# Patient Record
Sex: Female | Born: 1955
Health system: Southern US, Community
[De-identification: ages and names within clinical notes are randomized; demographics above are authoritative.]

## PROBLEM LIST (undated history)

## (undated) DIAGNOSIS — I10 Essential (primary) hypertension: Secondary | ICD-10-CM

## (undated) DIAGNOSIS — F419 Anxiety disorder, unspecified: Secondary | ICD-10-CM

## (undated) DIAGNOSIS — M1990 Unspecified osteoarthritis, unspecified site: Secondary | ICD-10-CM

## (undated) DIAGNOSIS — E119 Type 2 diabetes mellitus without complications: Secondary | ICD-10-CM

## (undated) DIAGNOSIS — D509 Iron deficiency anemia, unspecified: Secondary | ICD-10-CM

## (undated) DIAGNOSIS — M545 Low back pain, unspecified: Secondary | ICD-10-CM

## (undated) DIAGNOSIS — Z794 Long term (current) use of insulin: Secondary | ICD-10-CM

## (undated) DIAGNOSIS — N189 Chronic kidney disease, unspecified: Secondary | ICD-10-CM

## (undated) DIAGNOSIS — R200 Anesthesia of skin: Secondary | ICD-10-CM

## (undated) DIAGNOSIS — E7800 Pure hypercholesterolemia, unspecified: Secondary | ICD-10-CM

## (undated) DIAGNOSIS — E559 Vitamin D deficiency, unspecified: Secondary | ICD-10-CM

## (undated) DIAGNOSIS — K909 Intestinal malabsorption, unspecified: Secondary | ICD-10-CM

## (undated) DIAGNOSIS — R519 Headache, unspecified: Secondary | ICD-10-CM

## (undated) DIAGNOSIS — Z780 Asymptomatic menopausal state: Secondary | ICD-10-CM

## (undated) DIAGNOSIS — M170 Bilateral primary osteoarthritis of knee: Secondary | ICD-10-CM

## (undated) DIAGNOSIS — G8929 Other chronic pain: Secondary | ICD-10-CM

## (undated) DIAGNOSIS — E669 Obesity, unspecified: Secondary | ICD-10-CM

## (undated) DIAGNOSIS — F32A Depression, unspecified: Secondary | ICD-10-CM

## (undated) DIAGNOSIS — J45909 Unspecified asthma, uncomplicated: Secondary | ICD-10-CM

## (undated) DIAGNOSIS — J309 Allergic rhinitis, unspecified: Secondary | ICD-10-CM

## (undated) DIAGNOSIS — N183 Chronic kidney disease, stage 3 unspecified: Secondary | ICD-10-CM

## (undated) DIAGNOSIS — IMO0001 Reserved for inherently not codable concepts without codable children: Secondary | ICD-10-CM

## (undated) DIAGNOSIS — K219 Gastro-esophageal reflux disease without esophagitis: Secondary | ICD-10-CM

## (undated) DIAGNOSIS — D649 Anemia, unspecified: Secondary | ICD-10-CM

## (undated) DIAGNOSIS — R51 Headache: Secondary | ICD-10-CM

## (undated) HISTORY — DX: Long term (current) use of insulin: E11.9

## (undated) HISTORY — DX: Depression, unspecified: F32.A

## (undated) HISTORY — PX: COLONOSCOPY: SHX174

## (undated) HISTORY — DX: Type 2 diabetes mellitus without complications: Z79.4

## (undated) HISTORY — PX: UPPER GI ENDOSCOPY: SHX6162

## (undated) HISTORY — PX: EYE SURGERY: SHX253

## (undated) HISTORY — DX: Chronic kidney disease, stage 3 unspecified: N18.30

## (undated) HISTORY — DX: Bilateral primary osteoarthritis of knee: M17.0

## (undated) HISTORY — PX: HERNIA REPAIR: SHX51

## (undated) HISTORY — DX: Intestinal malabsorption, unspecified: K90.9

---

## 2001-12-25 ENCOUNTER — Emergency Department (HOSPITAL_COMMUNITY): Admission: EM | Admit: 2001-12-25 | Discharge: 2001-12-25 | Payer: Self-pay | Admitting: Emergency Medicine

## 2001-12-25 ENCOUNTER — Encounter: Payer: Self-pay | Admitting: Emergency Medicine

## 2002-01-24 ENCOUNTER — Encounter (HOSPITAL_COMMUNITY): Admission: RE | Admit: 2002-01-24 | Discharge: 2002-01-24 | Payer: Self-pay | Admitting: Family Medicine

## 2002-07-31 ENCOUNTER — Encounter: Payer: Self-pay | Admitting: Family Medicine

## 2002-07-31 ENCOUNTER — Encounter: Admission: RE | Admit: 2002-07-31 | Discharge: 2002-07-31 | Payer: Self-pay | Admitting: Family Medicine

## 2003-04-22 ENCOUNTER — Other Ambulatory Visit: Admission: RE | Admit: 2003-04-22 | Discharge: 2003-04-22 | Payer: Self-pay | Admitting: Family Medicine

## 2004-02-16 ENCOUNTER — Ambulatory Visit (HOSPITAL_COMMUNITY): Admission: RE | Admit: 2004-02-16 | Discharge: 2004-02-16 | Payer: Self-pay | Admitting: Family Medicine

## 2004-03-08 ENCOUNTER — Encounter: Admission: RE | Admit: 2004-03-08 | Discharge: 2004-03-08 | Payer: Self-pay | Admitting: Internal Medicine

## 2005-05-04 ENCOUNTER — Encounter: Admission: RE | Admit: 2005-05-04 | Discharge: 2005-05-04 | Payer: Self-pay | Admitting: Internal Medicine

## 2005-07-05 ENCOUNTER — Ambulatory Visit: Payer: Self-pay | Admitting: Hematology & Oncology

## 2005-07-06 ENCOUNTER — Inpatient Hospital Stay (HOSPITAL_COMMUNITY): Admission: AD | Admit: 2005-07-06 | Discharge: 2005-07-08 | Payer: Self-pay | Admitting: Internal Medicine

## 2005-07-19 LAB — CBC & DIFF AND RETIC
BASO%: 0.8 % (ref 0.0–2.0)
EOS%: 1.6 % (ref 0.0–7.0)
Eosinophils Absolute: 0.1 10*3/uL (ref 0.0–0.5)
LYMPH%: 25 % (ref 14.0–48.0)
MCH: 22.1 pg — ABNORMAL LOW (ref 26.0–34.0)
MCHC: 31.1 g/dL — ABNORMAL LOW (ref 32.0–36.0)
MCV: 70.9 fL — ABNORMAL LOW (ref 81.0–101.0)
MONO%: 10.6 % (ref 0.0–13.0)
Platelets: 224 10*3/uL (ref 145–400)
RBC: 4.75 10*6/uL (ref 3.70–5.32)
RDW: 36.2 % — ABNORMAL HIGH (ref 11.3–14.5)
RETIC #: 28.5 10*3/uL (ref 19.7–115.1)
Retic %: 0.6 % (ref 0.4–2.3)

## 2005-07-21 LAB — FERRITIN: Ferritin: 181 ng/mL (ref 10–291)

## 2005-08-01 LAB — CBC & DIFF AND RETIC
BASO%: 0.6 % (ref 0.0–2.0)
HCT: 36 % (ref 34.8–46.6)
IRF: 0.36 — ABNORMAL HIGH (ref 0.130–0.330)
MCHC: 32.3 g/dL (ref 32.0–36.0)
MONO#: 0.4 10*3/uL (ref 0.1–0.9)
NEUT%: 60.1 % (ref 39.6–76.8)
RDW: 36.7 % — ABNORMAL HIGH (ref 11.3–14.5)
RETIC #: 50.6 10*3/uL (ref 19.7–115.1)
Retic %: 1 % (ref 0.4–2.3)
WBC: 4.9 10*3/uL (ref 3.9–10.0)
lymph#: 1.5 10*3/uL (ref 0.9–3.3)

## 2005-08-01 LAB — CHCC SMEAR

## 2005-08-21 ENCOUNTER — Ambulatory Visit: Payer: Self-pay | Admitting: Hematology & Oncology

## 2005-09-06 LAB — CBC & DIFF AND RETIC
BASO%: 0.6 % (ref 0.0–2.0)
EOS%: 0.6 % (ref 0.0–7.0)
HCT: 30.1 % — ABNORMAL LOW (ref 34.8–46.6)
LYMPH%: 27.9 % (ref 14.0–48.0)
MCH: 25.9 pg — ABNORMAL LOW (ref 26.0–34.0)
MCHC: 32.6 g/dL (ref 32.0–36.0)
MCV: 79.3 fL — ABNORMAL LOW (ref 81.0–101.0)
MONO%: 10.8 % (ref 0.0–13.0)
NEUT%: 60.1 % (ref 39.6–76.8)
Platelets: 236 10*3/uL (ref 145–400)
lymph#: 1 10*3/uL (ref 0.9–3.3)

## 2005-09-08 LAB — TRANSFERRIN RECEPTOR, SOLUABLE: Transferrin Receptor, Soluble: 4.2 mg/L (ref 1.9–4.4)

## 2005-10-05 ENCOUNTER — Ambulatory Visit: Payer: Self-pay | Admitting: Hematology & Oncology

## 2005-10-05 LAB — CBC WITH DIFFERENTIAL/PLATELET
BASO%: 0.5 % (ref 0.0–2.0)
Basophils Absolute: 0 10*3/uL (ref 0.0–0.1)
HCT: 35.4 % (ref 34.8–46.6)
LYMPH%: 17.1 % (ref 14.0–48.0)
MCHC: 32.5 g/dL (ref 32.0–36.0)
MONO#: 0.5 10*3/uL (ref 0.1–0.9)
NEUT%: 70.9 % (ref 39.6–76.8)
Platelets: 235 10*3/uL (ref 145–400)
WBC: 4.4 10*3/uL (ref 3.9–10.0)

## 2005-11-22 ENCOUNTER — Encounter: Admission: RE | Admit: 2005-11-22 | Discharge: 2006-02-20 | Payer: Self-pay | Admitting: *Deleted

## 2005-11-28 ENCOUNTER — Ambulatory Visit: Payer: Self-pay | Admitting: Hematology & Oncology

## 2005-12-27 LAB — CBC WITH DIFFERENTIAL/PLATELET
Eosinophils Absolute: 0 10*3/uL (ref 0.0–0.5)
HCT: 32.5 % — ABNORMAL LOW (ref 34.8–46.6)
LYMPH%: 28.3 % (ref 14.0–48.0)
MONO#: 0.6 10*3/uL (ref 0.1–0.9)
NEUT#: 2.7 10*3/uL (ref 1.5–6.5)
NEUT%: 57.3 % (ref 39.6–76.8)
Platelets: 203 10*3/uL (ref 145–400)
RBC: 3.69 10*6/uL — ABNORMAL LOW (ref 3.70–5.32)
WBC: 4.6 10*3/uL (ref 3.9–10.0)

## 2006-01-22 ENCOUNTER — Ambulatory Visit: Payer: Self-pay | Admitting: Hematology & Oncology

## 2006-01-24 LAB — CBC WITH DIFFERENTIAL/PLATELET
BASO%: 0.5 % (ref 0.0–2.0)
LYMPH%: 35.1 % (ref 14.0–48.0)
MCHC: 32.3 g/dL (ref 32.0–36.0)
MCV: 90.6 fL (ref 81.0–101.0)
MONO%: 11.3 % (ref 0.0–13.0)
Platelets: 187 10*3/uL (ref 145–400)
RBC: 4.2 10*6/uL (ref 3.70–5.32)
RDW: 14.9 % — ABNORMAL HIGH (ref 11.3–14.5)
WBC: 3.5 10*3/uL — ABNORMAL LOW (ref 3.9–10.0)

## 2006-04-13 ENCOUNTER — Encounter: Admission: RE | Admit: 2006-04-13 | Discharge: 2006-04-13 | Payer: Self-pay | Admitting: General Practice

## 2006-10-12 ENCOUNTER — Encounter: Admission: RE | Admit: 2006-10-12 | Discharge: 2006-10-12 | Payer: Self-pay | Admitting: Internal Medicine

## 2008-01-03 ENCOUNTER — Encounter: Admission: RE | Admit: 2008-01-03 | Discharge: 2008-01-03 | Payer: Self-pay | Admitting: Internal Medicine

## 2008-01-10 ENCOUNTER — Ambulatory Visit: Payer: Self-pay | Admitting: Hematology & Oncology

## 2008-01-13 LAB — CBC WITH DIFFERENTIAL (CANCER CENTER ONLY)
BASO%: 0.4 % (ref 0.0–2.0)
Eosinophils Absolute: 0.1 10*3/uL (ref 0.0–0.5)
LYMPH#: 1.3 10*3/uL (ref 0.9–3.3)
MONO#: 0.3 10*3/uL (ref 0.1–0.9)
NEUT#: 1.8 10*3/uL (ref 1.5–6.5)
Platelets: 250 10*3/uL (ref 145–400)
RBC: 3.82 10*6/uL (ref 3.70–5.32)
WBC: 3.4 10*3/uL — ABNORMAL LOW (ref 3.9–10.0)

## 2008-01-13 LAB — CHCC SATELLITE - SMEAR

## 2008-01-15 LAB — RETICULOCYTES (CHCC)
ABS Retic: 42.9 10*3/uL (ref 19.0–186.0)
Retic Ct Pct: 1.1 % (ref 0.4–3.1)

## 2008-01-15 LAB — FERRITIN: Ferritin: 7 ng/mL — ABNORMAL LOW (ref 10–291)

## 2008-01-15 LAB — ERYTHROPOIETIN: Erythropoietin: 83.6 m[IU]/mL — ABNORMAL HIGH (ref 2.6–34.0)

## 2008-02-20 ENCOUNTER — Encounter: Admission: RE | Admit: 2008-02-20 | Discharge: 2008-02-20 | Payer: Self-pay | Admitting: Internal Medicine

## 2008-03-10 ENCOUNTER — Ambulatory Visit: Payer: Self-pay | Admitting: Hematology & Oncology

## 2008-03-11 LAB — CBC WITH DIFFERENTIAL (CANCER CENTER ONLY)
BASO%: 0.4 % (ref 0.0–2.0)
EOS%: 1.9 % (ref 0.0–7.0)
Eosinophils Absolute: 0.1 10*3/uL (ref 0.0–0.5)
MCH: 27.8 pg (ref 26.0–34.0)
MCHC: 32.9 g/dL (ref 32.0–36.0)
MONO%: 7.9 % (ref 0.0–13.0)
NEUT#: 1.7 10*3/uL (ref 1.5–6.5)
Platelets: 201 10*3/uL (ref 145–400)
RBC: 3.82 10*6/uL (ref 3.70–5.32)
RDW: 16.1 % — ABNORMAL HIGH (ref 10.5–14.6)

## 2008-03-11 LAB — RETICULOCYTES (CHCC): Retic Ct Pct: 1.5 % (ref 0.4–3.1)

## 2008-06-02 ENCOUNTER — Ambulatory Visit: Payer: Self-pay | Admitting: Hematology & Oncology

## 2008-06-25 LAB — CBC WITH DIFFERENTIAL (CANCER CENTER ONLY)
BASO#: 0 10*3/uL (ref 0.0–0.2)
Eosinophils Absolute: 0.1 10*3/uL (ref 0.0–0.5)
HCT: 33.8 % — ABNORMAL LOW (ref 34.8–46.6)
HGB: 11.1 g/dL — ABNORMAL LOW (ref 11.6–15.9)
MCH: 29.2 pg (ref 26.0–34.0)
MCHC: 32.9 g/dL (ref 32.0–36.0)
MONO%: 6.9 % (ref 0.0–13.0)
NEUT#: 2.1 10*3/uL (ref 1.5–6.5)
NEUT%: 52.4 % (ref 39.6–80.0)
RBC: 3.8 10*6/uL (ref 3.70–5.32)

## 2008-06-25 LAB — CHCC SATELLITE - SMEAR

## 2008-06-25 LAB — FERRITIN: Ferritin: 121 ng/mL (ref 10–291)

## 2008-08-27 ENCOUNTER — Emergency Department (HOSPITAL_COMMUNITY): Admission: EM | Admit: 2008-08-27 | Discharge: 2008-08-27 | Payer: Self-pay | Admitting: Emergency Medicine

## 2008-08-27 ENCOUNTER — Emergency Department (HOSPITAL_COMMUNITY): Admission: EM | Admit: 2008-08-27 | Discharge: 2008-08-27 | Payer: Self-pay | Admitting: Family Medicine

## 2008-10-14 ENCOUNTER — Ambulatory Visit: Payer: Self-pay | Admitting: Hematology & Oncology

## 2009-03-02 ENCOUNTER — Encounter: Admission: RE | Admit: 2009-03-02 | Discharge: 2009-03-02 | Payer: Self-pay | Admitting: Internal Medicine

## 2009-04-05 ENCOUNTER — Telehealth (INDEPENDENT_AMBULATORY_CARE_PROVIDER_SITE_OTHER): Payer: Self-pay | Admitting: *Deleted

## 2009-04-05 ENCOUNTER — Encounter: Payer: Self-pay | Admitting: Gastroenterology

## 2009-04-14 ENCOUNTER — Emergency Department (HOSPITAL_COMMUNITY): Admission: EM | Admit: 2009-04-14 | Discharge: 2009-04-14 | Payer: Self-pay | Admitting: Family Medicine

## 2010-03-10 ENCOUNTER — Emergency Department (HOSPITAL_COMMUNITY): Admission: EM | Admit: 2010-03-10 | Discharge: 2009-04-03 | Payer: Self-pay | Admitting: Emergency Medicine

## 2010-03-21 ENCOUNTER — Encounter
Admission: RE | Admit: 2010-03-21 | Discharge: 2010-03-21 | Payer: Self-pay | Source: Home / Self Care | Attending: Internal Medicine | Admitting: Internal Medicine

## 2010-05-03 NOTE — Progress Notes (Signed)
Summary: APPT  Phone Note Outgoing Call Call back at [facility] Phone [phone]   Call placed by: Chales Abrahams CMA Duncan Dull),  April 05, 2009 9:13 AM Summary of Call: called and gave pt the appt date and time.  The new pt packet was mailed. Initial call taken by: Chales Abrahams CMA Duncan Dull),  April 05, 2009 9:14 AM

## 2010-05-03 NOTE — Letter (Signed)
Summary: New Patient letter  [facility]  [address] [place], [place] [address]   Phone: [phone]  Fax: [phone]       04/05/2009 MRN: [id number]  [facility] [address] [place], [place]  [address]  Dear Ms. Gaunt,  Welcome to the Gastroenterology Division at [facility].    You are scheduled to see Dr.  Christella Hartigan  on 04/27/2009 at 9:00 am on the 3rd floor at [facility], [address]  We ask that you try to arrive at our  office 15 minutes prior to your appointment time to allow for check-in.  We would like you to complete the enclosed self-administered evaluation form prior to your visit and bring it with you on the day of your appointment.  We will review it with you.  Also, please bring a complete list of all your medications or, if you prefer, bring the medication bottles and we will list them.  Please bring your insurance card so that we may make a copy of it.  If your insurance requires a referral to see a specialist, please bring your referral form from your primary care physician.  Co-payments are due at the time of your visit and may be paid by cash, check or credit card.     Your office visit will consist of a consult with your physician (includes a physical exam), any laboratory testing he/she may order, scheduling of any necessary diagnostic testing (e.g. x-ray, ultrasound, CT-scan), and scheduling of a procedure (e.g. Endoscopy, Colonoscopy) if required.  Please allow enough time on your schedule to allow for any/all of these possibilities.    If you cannot keep your appointment, please call [phone] to cancel or reschedule prior to your appointment date.  This allows [place] the opportunity to schedule an appointment for another patient in need of care.  If you do not cancel or reschedule by 5 p.m. the business day prior to your appointment date, you will be charged a $50.00 late cancellation/no-show fee.    Thank you for  choosing [facility] for your medical needs.  We appreciate the opportunity to care for you.  Please visit [place] at our website  to learn more about our practice.                     Sincerely,                                                             The Gastroenterology Division

## 2010-06-19 LAB — URINALYSIS, ROUTINE W REFLEX MICROSCOPIC
Glucose, UA: NEGATIVE mg/dL
Leukocytes, UA: NEGATIVE
Nitrite: NEGATIVE
Protein, ur: 30 mg/dL — AB
Specific Gravity, Urine: 1.029 (ref 1.005–1.030)
Urobilinogen, UA: 0.2 mg/dL (ref 0.0–1.0)
pH: 5 (ref 5.0–8.0)

## 2010-06-19 LAB — COMPREHENSIVE METABOLIC PANEL
ALT: 19 U/L (ref 0–35)
AST: 24 U/L (ref 0–37)
Albumin: 3.7 g/dL (ref 3.5–5.2)
Alkaline Phosphatase: 69 U/L (ref 39–117)
BUN: 21 mg/dL (ref 6–23)
CO2: 27 mEq/L (ref 19–32)
Calcium: 10.1 mg/dL (ref 8.4–10.5)
Chloride: 103 mEq/L (ref 96–112)
Creatinine, Ser: 1.62 mg/dL — ABNORMAL HIGH (ref 0.4–1.2)
GFR calc Af Amer: 40 mL/min — ABNORMAL LOW (ref >60)
GFR calc non Af Amer: 33 mL/min — ABNORMAL LOW (ref >60)
Glucose, Bld: 232 mg/dL — ABNORMAL HIGH (ref 70–99)
Potassium: 4.2 mEq/L (ref 3.5–5.1)
Sodium: 138 mEq/L (ref 135–145)
Total Bilirubin: 0.7 mg/dL (ref 0.3–1.2)
Total Protein: 7.7 g/dL (ref 6.0–8.3)

## 2010-06-19 LAB — CBC
HCT: 34.6 % — ABNORMAL LOW (ref 36.0–46.0)
Hemoglobin: 11.3 g/dL — ABNORMAL LOW (ref 12.0–15.0)
MCHC: 32.5 g/dL (ref 30.0–36.0)
MCV: 89.8 fL (ref 78.0–100.0)
Platelets: 181 10*3/uL (ref 150–400)
RBC: 3.85 MIL/uL — ABNORMAL LOW (ref 3.87–5.11)
RDW: 13.6 % (ref 11.5–15.5)
WBC: 5.7 10*3/uL (ref 4.0–10.5)

## 2010-06-19 LAB — DIFFERENTIAL
Basophils Absolute: 0 10*3/uL (ref 0.0–0.1)
Basophils Relative: 0 % (ref 0–1)
Eosinophils Absolute: 0 10*3/uL (ref 0.0–0.7)
Eosinophils Relative: 0 % (ref 0–5)
Lymphocytes Relative: 17 % (ref 12–46)
Lymphs Abs: 1 10*3/uL (ref 0.7–4.0)
Monocytes Absolute: 0.5 10*3/uL (ref 0.1–1.0)
Monocytes Relative: 9 % (ref 3–12)
Neutro Abs: 4.3 10*3/uL (ref 1.7–7.7)
Neutrophils Relative %: 74 % (ref 43–77)

## 2010-06-19 LAB — GLUCOSE, CAPILLARY: Glucose-Capillary: 187 mg/dL — ABNORMAL HIGH (ref 70–99)

## 2010-06-19 LAB — URINE MICROSCOPIC-ADD ON

## 2010-06-19 LAB — LIPASE, BLOOD: Lipase: 40 U/L (ref 11–59)

## 2010-07-12 LAB — URINALYSIS, ROUTINE W REFLEX MICROSCOPIC
Bilirubin Urine: NEGATIVE
Glucose, UA: NEGATIVE mg/dL
Hgb urine dipstick: NEGATIVE
Ketones, ur: NEGATIVE mg/dL
Nitrite: NEGATIVE
Protein, ur: NEGATIVE mg/dL
Specific Gravity, Urine: 1.016 (ref 1.005–1.030)
Urobilinogen, UA: 0.2 mg/dL (ref 0.0–1.0)
pH: 5 (ref 5.0–8.0)

## 2010-07-12 LAB — BASIC METABOLIC PANEL
BUN: 14 mg/dL (ref 6–23)
CO2: 28 mEq/L (ref 19–32)
Calcium: 9.6 mg/dL (ref 8.4–10.5)
Chloride: 97 mEq/L (ref 96–112)
Creatinine, Ser: 1.1 mg/dL (ref 0.4–1.2)
GFR calc Af Amer: >60 mL/min (ref >60)
GFR calc non Af Amer: 52 mL/min — ABNORMAL LOW (ref >60)
Glucose, Bld: 157 mg/dL — ABNORMAL HIGH (ref 70–99)
Potassium: 3.6 mEq/L (ref 3.5–5.1)
Sodium: 131 mEq/L — ABNORMAL LOW (ref 135–145)

## 2010-07-12 LAB — URINE MICROSCOPIC-ADD ON

## 2010-07-12 LAB — DIFFERENTIAL
Basophils Absolute: 0 10*3/uL (ref 0.0–0.1)
Basophils Relative: 1 % (ref 0–1)
Eosinophils Absolute: 0.1 10*3/uL (ref 0.0–0.7)
Eosinophils Relative: 1 % (ref 0–5)
Lymphocytes Relative: 40 % (ref 12–46)
Lymphs Abs: 2.1 10*3/uL (ref 0.7–4.0)
Monocytes Absolute: 0.6 10*3/uL (ref 0.1–1.0)
Monocytes Relative: 11 % (ref 3–12)
Neutro Abs: 2.5 10*3/uL (ref 1.7–7.7)
Neutrophils Relative %: 48 % (ref 43–77)

## 2010-07-12 LAB — POCT URINALYSIS DIP (DEVICE)
Bilirubin Urine: NEGATIVE
Glucose, UA: NEGATIVE mg/dL
Ketones, ur: NEGATIVE mg/dL
Nitrite: NEGATIVE
Protein, ur: NEGATIVE mg/dL
Specific Gravity, Urine: 1.01 (ref 1.005–1.030)
Urobilinogen, UA: 0.2 mg/dL (ref 0.0–1.0)
pH: 5.5 (ref 5.0–8.0)

## 2010-07-12 LAB — CBC
HCT: 36.6 % (ref 36.0–46.0)
Hemoglobin: 12.1 g/dL (ref 12.0–15.0)
MCHC: 33.1 g/dL (ref 30.0–36.0)
MCV: 89.3 fL (ref 78.0–100.0)
Platelets: 190 10*3/uL (ref 150–400)
RBC: 4.1 MIL/uL (ref 3.87–5.11)
RDW: 12.8 % (ref 11.5–15.5)
WBC: 5.2 10*3/uL (ref 4.0–10.5)

## 2010-08-19 NOTE — Consult Note (Signed)
NAMEEMILYANNE, Gardner              ACCOUNT NO.:  [id number]   MEDICAL RECORD NO.:  [id number]          PATIENT TYPE:  INP   LOCATION:  5740                         FACILITY:  [facility]   PHYSICIAN:  Anselmo Rod, M.D.  DATE OF BIRTH:  22-May-1955   DATE OF CONSULTATION:  07/06/2005  DATE OF DISCHARGE:                                   CONSULTATION   REASON FOR CONSULTATION:  Severe iron deficiency anemia with hemoglobin of  5.9 g/dl.   ASSESSMENT:  1.  Severe iron deficiency anemia with guaiac negative stools, rule out      hematological etiology.  2.  History of peptic ulcer disease when the patient had upper      gastrointestinal bleed treated medically.  3.  History of reflux on Aciphex.  4.  History of nonsteroidal use for severe arthritis.  5.  Hypertension for the last 5 years.  6.  Adult-onset diabetes mellitus for the last 15 years, presently on oral      hypoglycemics and insulin.  7.  Allergy to codeine.   RECOMMENDATIONS:  1.  Hematology consult as soon as possible.  2.  EGD and colonoscopy once the patient has had a hematologic evaluation      and has been transfused.  3.  Avoid all nonsteroidals.  4.  Protonix 40 mg p.o. daily.  5.  EGD and colonoscopy will be planned for Monday, July 10, 2005, as      discussed with the patient.   HISTORY OF PRESENT ILLNESS:  [name] is a 55 year old, African-  American female with above-mentioned medical problems who gives a history of  generalized fatigue and some palpitations with exertion.  She says this has  been a chronic problem for her and she felt this was her baseline condition.  She had labs done about 2 weeks ago when she was found to have a hemoglobin  of around 7 g/dl, the exact numbers are not available to me, according to  the patient's verbal report.  She claims she was re-evaluated yesterday when  her hemoglobin was found to be 5.9.  For this reason, she was therefore  asked to come to the hospital  for further evaluation and blood transfusion.  She denies any major GI complaints at this time.  Her appetite is good and  weight has been stable.  She has one to two well-formed bowel movements per  day.  She has had a history of anemia in the past, but never had a  colonoscopy.  There is no known family history of colon cancer.  She has  been on Aciphex for reflux and had a bleeding ulcer 5 years ago and has been  treated for this medically.  There is no history of melena or hematochezia.  There is no history of dysphagia or odynophagia.  She has had intense desire  to chew on ice which she claims she does on a daily basis for several years  now.  She describes having heavy menstrual cycles in the past, but this is  not longer a problem.  She admits using  6-8 Advil or Aleve a day for severe  arthritis and leg pain.   PAST MEDICAL HISTORY:  See list above.   ALLERGIES:  CODEINE.   MEDICATIONS:  Aciphex, insulin, metformin, hydrochloride, Lotrisone,  Diflucan, Imdur, Benicar, Phenergan p.r.n., Niferex 150 mg daily, Actos,  Tramadol, acetaminophen.   CURRENT MEDICATIONS:  Metformin, Benicar, Aciphex, Lantus, Actos, Phenergan  and Tylenol p.r.n.   SOCIAL HISTORY:  She is single.  She has no children.  She denies use of  alcohol, tobacco or drugs.  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  She lives alone.   FAMILY HISTORY:  Her maternal grandmother had ovarian cancer.  There is no  known family of breast, endometrial or cervical cancer.  Parents had heart  disease.   REVIEW OF SYSTEMS:  CARDIOPULMONARY:  Some palpitations today.  GASTROINTESTINAL:  Appetite good.  Weight stable.  No history of abnormal  weight loss.   PHYSICAL EXAMINATION:  GENERAL:  A very pleasant, cooperative, middle-age,  African-American female in no acute distress.  VITAL SIGNS:  Stable vital signs except for slight tachycardia of 110 beats  per minute.  HEENT:  Oropharyngeal mucosa  without exudate.  NECK:  Supple with no JVD, thyromegaly or lymphadenopathy.  CHEST:  Clear to auscultation.  HEART:  S1, S2 regular.  The patient is slightly tachycardic as mentioned  above.  No rales, rhonchi or wheezing.  ABDOMEN:  Soft, nontender with normal bowel sounds.  No hepatosplenomegaly  appreciated.  RECTAL:  Digital rectal exam with stool guaiac negative, brown stools.   LABORATORY DATA AND X-RAY FINDINGS:  From Dr. Mathews Robinsons office, a white  count was 3.8 with hemoglobin 5.9, hematocrit 23.1, platelets 403,000.  Iron  was less than 10, ferritin was less than 1.  RBC folate was 918, hemoglobin  electrophoresis and hemoglobin A1c is pending.  Labs are as above.   Further recommendations will be made after I discuss her case with the  [facility] team and with Dr. Renae Gloss.      Anselmo Rod, M.D.  Electronically Signed     JNM/MEDQ  D:  07/06/2005  T:  07/07/2005  Job:  [id number]   cc:   Merlene Laughter. Renae Gloss, M.D.  Fax: [phone]

## 2010-08-19 NOTE — Discharge Summary (Signed)
NAMEMAMTA, RIMMER              ACCOUNT NO.:  [id number]   MEDICAL RECORD NO.:  [id number]          PATIENT TYPE:  INP   LOCATION:  5740                         FACILITY:  [facility]   PHYSICIAN:  Nelma Rothman, MD   DATE OF BIRTH:  1955-06-28   DATE OF ADMISSION:  07/06/2005  DATE OF DISCHARGE:  07/08/2005                                 DISCHARGE SUMMARY   PRIMARY CARE PHYSICIAN:  Cala Bradford R. Renae Gloss, M.D.   PRIMARY HEMATOLOGIST:  Rose Phi. Myna Hidalgo, M.D.   CONSULTING PHYSICIANS:  Anselmo Rod, M.D. of gastroenterology.   DISCHARGE DIAGNOSES:  1.  Severe iron deficiency anemia.  2.  Hypertension.  3.  Diabetes mellitus.   PROCEDURE:  The patient underwent transfusion of 5 units of packed red blood  cells as well as administration of 500 mg of IV InFeD.   LABORATORY DATA:  Labs drawn at her physician's office the previous day, her  hemoglobin was 5.9 with a hematocrit of 23.1 and MCV of 61.8.  Ferritin was  less than 1.  RBC folate level was 918.  Vitamin B-12 669.  Iron level was  less than 10.  TIBC and percent saturation could not be calculated secondary  to low iron levels.  Her creatinine was 1.4 on admission, and following 5  units of packed red blood cells, hemoglobin 10.5, hematocrit 32.9.   HISTORY AND PHYSICAL:  Please see dictated admission history and physical by  Dr. Renae Gloss for further details but briefly, Ms. Fraleigh is a very pleasant  55 year old female with a history of diabetes, and hypertension, and  longstanding history of severe iron deficiency anemia presenting with the  same.  She was experiencing fatigue and significant amount of pica.  She was  admitted for further evaluation and management.   HOSPITAL COURSE:  1.  Iron deficiency anemia.  The patient endorsed a longstanding history of      iron deficiency anemia.  She states that she was previously seen by Dr.      Myna Hidalgo with regard to this and had already scheduled followup with him  later this month.  She states that she last received an IV iron infusion      about two years ago but has actually never required a blood transfusion.      She denied any GI complaints including no melena, hematemesis, bright      red blood per rectum, or hematochezia.  She also denied any abdominal      pain.  Stool was negative for occult blood but nevertheless a severe      iron deficiency anemia in this 55 year old lady would warrant endoscopy      for further evaluation.  For that reason, Dr. Loreta Ave was consulted and      kindly saw Ms. Thompson on April5, 2007.  Given that she was      hemodynamically stable, hemoglobin increased with appropriately with      transfusion and stool was negative for occult blood, it was felt that      this workup could be completed as an outpatient.  I  spoke by phone with      Dr. Myna Hidalgo, as well, who agreed with the plan.  The patient received a      total of 5 units of packed red blood cells as well as one dose of 500 mg      of IV InFeD.  Her hemoglobin the morning of discharge was 10.5 and she      was feeling ready for discharge to home.  She is afebrile,      hemodynamically stable, and much improved.   DISCHARGE MEDICATIONS:  1.  Ferrous sulfate 325 mg p.o. b.i.d.  She is instructed to take this with      500 mg of vitamin C, and to take between meals to enhance absorption.  2.  She will also resume the remainder of her home medications which      include:  Actos 45 mg p.o. daily.  3.  Aciphex 30 mg p.o. daily.  4.  Metformin 500 mg p.o. b.i.d.  5.  Lantus 20 units subcu q.h.s.  6.  Benicar 40/25, one tablet p.o. daily.  7.  Allegra 180 mg p.o. daily.   DISCHARGE INSTRUCTIONS:  1.  The patient will be discharged home today for the holiday weekend.  2.  She was instructed to stay well-hydrated to prevent constipation.  3.  She will follow up with Dr. Myna Hidalgo as previously scheduled on April18,      2007.  4.  She will also follow up with Dr.  Loreta Ave for both upper endoscopy and      colonoscopy, the week of April16, 2007.  5.  She knows to return to the hospital sooner should she develop worsening      fatigue, tachycardia, or any evidence of blood loss.      Nelma Rothman, MD  Electronically Signed     RAR/MEDQ  D:  07/08/2005  T:  07/08/2005  Job:  [id number]   cc:   Merlene Laughter. Renae Gloss, M.D.  Fax: [phone]   Rose Phi. Myna Hidalgo, M.D.  Fax: [phone]   VHQION GEX BMWU, M.D.  Fax: [phone]

## 2010-12-14 ENCOUNTER — Other Ambulatory Visit: Payer: Self-pay | Admitting: Internal Medicine

## 2010-12-14 DIAGNOSIS — Z1231 Encounter for screening mammogram for malignant neoplasm of breast: Secondary | ICD-10-CM

## 2011-03-23 ENCOUNTER — Ambulatory Visit
Admission: RE | Admit: 2011-03-23 | Discharge: 2011-03-23 | Disposition: A | Discharge status: Home / Self Care | Payer: BC Managed Care – PPO | Source: Ambulatory Visit | Attending: Internal Medicine | Admitting: Internal Medicine

## 2011-03-23 DIAGNOSIS — Z1231 Encounter for screening mammogram for malignant neoplasm of breast: Secondary | ICD-10-CM

## 2012-02-02 ENCOUNTER — Other Ambulatory Visit: Payer: Self-pay | Admitting: Orthopedic Surgery

## 2012-02-08 ENCOUNTER — Encounter (HOSPITAL_BASED_OUTPATIENT_CLINIC_OR_DEPARTMENT_OTHER): Payer: Self-pay | Admitting: *Deleted

## 2012-02-08 NOTE — H&P (Signed)
  Subjective: Patient returns for followup of her right knee pain.  There is concern that patient does have a degenerated medial meniscal tear.  Patient had an injection of steroids into this knee on January 02, 2012.  Patient states that this only gave her relief for a couple days.  Since that time she has been having more pain and unfortunately was in a wheelchair over the course of the [place] at the beach.  Patient states that at that time she was having significant trouble moving the knee and was unable to bear weight.  Patient states that it slowly has gotten better but still significant amount of pain.  Patient states that the pain does wake her up at night and she is very frustrated.  He denies any new symptoms denies any fevers or chills    PMHx: Asthma, hypertension, anemia, hyperlipidemia, esophageal reflux disease and type 2 diabetes.  Medications: Metformin, Prevacid, Actos, WelChol and Diovan.  Allergies: NKDA.  Hospitalizations: None.  Surgeries: None.    FMHx: Diabetes, high blood pressure and arthritis.  SoHx: She is single.  Denies tobacco or alcohol use.  She is employed.  ROS: Patient denies dizziness, nausea, fever, chills, vomiting, shortness of breath, chest pain, loss of appetite, or rash.  Positive for glasses.  PHYSICAL EXAM: Well-developed, well-nourished.  Awake, alert, and oriented x3.  Extraocular motion is intact.  No use of accessory respiratory muscles for breathing.   Cardiovascular exam reveals a regular rhythm.  Skin is intact without cuts, scrapes, or abrasions. Patient's right knee exam shows no effusion, patient's range of motion is from 5 to approximately 80.  Patient is very tender to palpation over the medial joint line.  Neurovascularly intact distally  Assessment: Right knee pain with questionable medial meniscal tear  Plan: Patient has failed conservative therapy at this time is having what appears to be internal derangement of the right knee.  I am  concerned that she has a sizable meniscal tear and she would likely benefit from an arthroscopic procedure.  Patient will be scheduled for a right knee arthroscope for further evaluation and likely treatment. I have had a prolonged discussion with the patient regarding the risk and benefits of the surgical procedure.  The patient understands the risks include but are not limited to bleeding infection and failure of the surgery to cure the problem and need for further surgery.  The patient understands there is a slight risk of death at the time of surgery.  The patient understands these risks along with the potential benefits and wishes to proceed with surgical intervention.  The patient will discuss the full surgical procedure with Darl Pikes our surgical scheduler and the surgery will be set up at the patient's convenience.  The patient will be followed in the office in the postoperative period.

## 2012-02-08 NOTE — Progress Notes (Signed)
To come in for bmet-ekg-to bring meds list

## 2012-02-09 ENCOUNTER — Encounter (HOSPITAL_BASED_OUTPATIENT_CLINIC_OR_DEPARTMENT_OTHER)
Admission: RE | Admit: 2012-02-09 | Discharge: 2012-02-09 | Disposition: A | Discharge status: Home / Self Care | Payer: BC Managed Care – PPO | Source: Ambulatory Visit | Attending: Orthopedic Surgery | Admitting: Orthopedic Surgery

## 2012-02-09 ENCOUNTER — Other Ambulatory Visit: Payer: Self-pay

## 2012-02-09 LAB — BASIC METABOLIC PANEL
CO2: 29 mEq/L (ref 19–32)
Calcium: 9.7 mg/dL (ref 8.4–10.5)
GFR calc non Af Amer: 48 mL/min — ABNORMAL LOW (ref >90)
Glucose, Bld: 233 mg/dL — ABNORMAL HIGH (ref 70–99)
Potassium: 4.1 mEq/L (ref 3.5–5.1)
Sodium: 137 mEq/L (ref 135–145)

## 2012-02-12 ENCOUNTER — Encounter (HOSPITAL_BASED_OUTPATIENT_CLINIC_OR_DEPARTMENT_OTHER)
Admission: RE | Disposition: A | Discharge status: Home / Self Care | Payer: Self-pay | Source: Ambulatory Visit | Attending: Orthopedic Surgery

## 2012-02-12 ENCOUNTER — Encounter (HOSPITAL_BASED_OUTPATIENT_CLINIC_OR_DEPARTMENT_OTHER): Payer: Self-pay | Admitting: *Deleted

## 2012-02-12 ENCOUNTER — Encounter (HOSPITAL_BASED_OUTPATIENT_CLINIC_OR_DEPARTMENT_OTHER): Payer: Self-pay | Admitting: Anesthesiology

## 2012-02-12 ENCOUNTER — Encounter (HOSPITAL_BASED_OUTPATIENT_CLINIC_OR_DEPARTMENT_OTHER): Payer: Self-pay

## 2012-02-12 ENCOUNTER — Ambulatory Visit (HOSPITAL_BASED_OUTPATIENT_CLINIC_OR_DEPARTMENT_OTHER)
Admission: RE | Admit: 2012-02-12 | Discharge: 2012-02-12 | Disposition: A | Discharge status: Home / Self Care | Payer: BC Managed Care – PPO | Source: Ambulatory Visit | Attending: Orthopedic Surgery | Admitting: Orthopedic Surgery

## 2012-02-12 ENCOUNTER — Ambulatory Visit (HOSPITAL_BASED_OUTPATIENT_CLINIC_OR_DEPARTMENT_OTHER): Payer: BC Managed Care – PPO | Admitting: *Deleted

## 2012-02-12 DIAGNOSIS — I10 Essential (primary) hypertension: Secondary | ICD-10-CM | POA: Insufficient documentation

## 2012-02-12 DIAGNOSIS — Z79899 Other long term (current) drug therapy: Secondary | ICD-10-CM | POA: Insufficient documentation

## 2012-02-12 DIAGNOSIS — M2240 Chondromalacia patellae, unspecified knee: Secondary | ICD-10-CM | POA: Insufficient documentation

## 2012-02-12 DIAGNOSIS — E785 Hyperlipidemia, unspecified: Secondary | ICD-10-CM | POA: Insufficient documentation

## 2012-02-12 DIAGNOSIS — S83289A Other tear of lateral meniscus, current injury, unspecified knee, initial encounter: Secondary | ICD-10-CM

## 2012-02-12 DIAGNOSIS — D649 Anemia, unspecified: Secondary | ICD-10-CM | POA: Insufficient documentation

## 2012-02-12 DIAGNOSIS — E119 Type 2 diabetes mellitus without complications: Secondary | ICD-10-CM | POA: Insufficient documentation

## 2012-02-12 DIAGNOSIS — M23359 Other meniscus derangements, posterior horn of lateral meniscus, unspecified knee: Secondary | ICD-10-CM | POA: Insufficient documentation

## 2012-02-12 DIAGNOSIS — Z01812 Encounter for preprocedural laboratory examination: Secondary | ICD-10-CM | POA: Insufficient documentation

## 2012-02-12 DIAGNOSIS — Z01810 Encounter for preprocedural cardiovascular examination: Secondary | ICD-10-CM | POA: Insufficient documentation

## 2012-02-12 DIAGNOSIS — J45909 Unspecified asthma, uncomplicated: Secondary | ICD-10-CM | POA: Insufficient documentation

## 2012-02-12 DIAGNOSIS — K219 Gastro-esophageal reflux disease without esophagitis: Secondary | ICD-10-CM | POA: Insufficient documentation

## 2012-02-12 HISTORY — DX: Essential (primary) hypertension: I10

## 2012-02-12 HISTORY — DX: Anesthesia of skin: R20.0

## 2012-02-12 HISTORY — PX: KNEE ARTHROSCOPY: SHX127

## 2012-02-12 HISTORY — DX: Type 2 diabetes mellitus without complications: E11.9

## 2012-02-12 HISTORY — DX: Unspecified osteoarthritis, unspecified site: M19.90

## 2012-02-12 HISTORY — DX: Gastro-esophageal reflux disease without esophagitis: K21.9

## 2012-02-12 HISTORY — DX: Anemia, unspecified: D64.9

## 2012-02-12 LAB — POCT HEMOGLOBIN-HEMACUE: Hemoglobin: 12.2 g/dL (ref 12.0–15.0)

## 2012-02-12 SURGERY — ARTHROSCOPY, KNEE
Anesthesia: General | Site: Knee | Laterality: Right | Wound class: Clean

## 2012-02-12 MED ORDER — FENTANYL CITRATE 0.05 MG/ML IJ SOLN
INTRAMUSCULAR | Status: DC | PRN
  Administered 2012-02-12 (×2): 50 ug via INTRAVENOUS

## 2012-02-12 MED ORDER — KETOROLAC TROMETHAMINE 30 MG/ML IJ SOLN
30.0000 mg | Freq: Once | INTRAMUSCULAR | Status: AC
  Administered 2012-02-12: 30 mg via INTRAVENOUS

## 2012-02-12 MED ORDER — DEXTROSE-NACL 5-0.45 % IV SOLN: INTRAVENOUS | Status: DC

## 2012-02-12 MED ORDER — CHLORHEXIDINE GLUCONATE 4 % EX LIQD: 60.0000 mL | Freq: Once | CUTANEOUS | Status: DC

## 2012-02-12 MED ORDER — BUPIVACAINE HCL (PF) 0.5 % IJ SOLN
INTRAMUSCULAR | Status: DC | PRN
  Administered 2012-02-12: 20 mL

## 2012-02-12 MED ORDER — LACTATED RINGERS IV SOLN
INTRAVENOUS | Status: DC
  Administered 2012-02-12 (×2): via INTRAVENOUS

## 2012-02-12 MED ORDER — ONDANSETRON HCL 4 MG/2ML IJ SOLN
INTRAMUSCULAR | Status: DC | PRN
  Administered 2012-02-12: 4 mg via INTRAVENOUS

## 2012-02-12 MED ORDER — OXYCODONE-ACETAMINOPHEN 5-325 MG PO TABS: 1.0000 | ORAL_TABLET | ORAL | Status: DC | PRN

## 2012-02-12 MED ORDER — PROPOFOL 10 MG/ML IV BOLUS
INTRAVENOUS | Status: DC | PRN
  Administered 2012-02-12: 150 mg via INTRAVENOUS

## 2012-02-12 MED ORDER — HYDROMORPHONE HCL PF 1 MG/ML IJ SOLN
0.2500 mg | INTRAMUSCULAR | Status: DC | PRN
  Administered 2012-02-12 (×3): 0.5 mg via INTRAVENOUS

## 2012-02-12 MED ORDER — OXYCODONE HCL 5 MG PO TABS: 5.0000 mg | ORAL_TABLET | Freq: Once | ORAL | Status: DC | PRN

## 2012-02-12 MED ORDER — LACTATED RINGERS IV SOLN: INTRAVENOUS | Status: DC

## 2012-02-12 MED ORDER — LIDOCAINE HCL (CARDIAC) 20 MG/ML IV SOLN
INTRAVENOUS | Status: DC | PRN
  Administered 2012-02-12: 50 mg via INTRAVENOUS

## 2012-02-12 MED ORDER — MIDAZOLAM HCL 5 MG/5ML IJ SOLN
INTRAMUSCULAR | Status: DC | PRN
  Administered 2012-02-12: 2 mg via INTRAVENOUS

## 2012-02-12 MED ORDER — OXYCODONE HCL 5 MG/5ML PO SOLN: 5.0000 mg | Freq: Once | ORAL | Status: DC | PRN

## 2012-02-12 MED ORDER — SODIUM CHLORIDE 0.9 % IR SOLN
Status: DC | PRN
  Administered 2012-02-12: 14:00:00

## 2012-02-12 MED ORDER — CEFAZOLIN SODIUM-DEXTROSE 2-3 GM-% IV SOLR
2.0000 g | INTRAVENOUS | Status: AC
  Administered 2012-02-12: 2 g via INTRAVENOUS

## 2012-02-12 SURGICAL SUPPLY — 40 items
BANDAGE ELASTIC 6 VELCRO ST LF (GAUZE/BANDAGES/DRESSINGS) ×2 IMPLANT
BLADE 4.2CUDA (BLADE) IMPLANT
BLADE CUTTER GATOR 3.5 (BLADE) ×2 IMPLANT
BLADE GREAT WHITE 4.2 (BLADE) ×2 IMPLANT
CANISTER OMNI JUG 16 LITER (MISCELLANEOUS) IMPLANT
CANISTER SUCTION 2500CC (MISCELLANEOUS) IMPLANT
CHLORAPREP W/TINT 26ML (MISCELLANEOUS) ×2 IMPLANT
CLOTH BEACON ORANGE TIMEOUT ST (SAFETY) ×2 IMPLANT
DRAPE ARTHROSCOPY W/POUCH 114 (DRAPES) ×2 IMPLANT
ELECT MENISCUS 165MM 90D (ELECTRODE) IMPLANT
ELECT REM PT RETURN 9FT ADLT (ELECTROSURGICAL)
ELECTRODE REM PT RTRN 9FT ADLT (ELECTROSURGICAL) IMPLANT
GAUZE XEROFORM 1X8 LF (GAUZE/BANDAGES/DRESSINGS) ×2 IMPLANT
GLOVE BIO SURGEON STRL SZ 6.5 (GLOVE) ×4 IMPLANT
GLOVE BIO SURGEON STRL SZ7 (GLOVE) ×2 IMPLANT
GLOVE BIO SURGEON STRL SZ7.5 (GLOVE) ×2 IMPLANT
GLOVE BIOGEL PI IND STRL 7.0 (GLOVE) ×2 IMPLANT
GLOVE BIOGEL PI IND STRL 8 (GLOVE) ×1 IMPLANT
GLOVE BIOGEL PI INDICATOR 7.0 (GLOVE) ×2
GLOVE BIOGEL PI INDICATOR 8 (GLOVE) ×1
GLOVE ECLIPSE 6.5 STRL STRAW (GLOVE) ×4 IMPLANT
GOWN PREVENTION PLUS XLARGE (GOWN DISPOSABLE) ×8 IMPLANT
GOWN PREVENTION PLUS XXLARGE (GOWN DISPOSABLE) ×2 IMPLANT
KNEE WRAP E Z 3 GEL PACK (MISCELLANEOUS) ×2 IMPLANT
NDL SAFETY ECLIPSE 18X1.5 (NEEDLE) IMPLANT
NEEDLE FILTER BLUNT 18X 1/2SAF (NEEDLE)
NEEDLE FILTER BLUNT 18X1 1/2 (NEEDLE) IMPLANT
NEEDLE HYPO 18GX1.5 SHARP (NEEDLE)
PACK ARTHROSCOPY DSU (CUSTOM PROCEDURE TRAY) ×2 IMPLANT
PACK BASIN DAY SURGERY FS (CUSTOM PROCEDURE TRAY) ×2 IMPLANT
PENCIL BUTTON HOLSTER BLD 10FT (ELECTRODE) IMPLANT
SET ARTHROSCOPY TUBING (MISCELLANEOUS) ×1
SET ARTHROSCOPY TUBING LN (MISCELLANEOUS) ×1 IMPLANT
SLEEVE SCD COMPRESS KNEE MED (MISCELLANEOUS) IMPLANT
SPONGE GAUZE 4X4 12PLY (GAUZE/BANDAGES/DRESSINGS) ×2 IMPLANT
SYR 3ML 18GX1 1/2 (SYRINGE) IMPLANT
SYR 5ML LL (SYRINGE) IMPLANT
TOWEL OR 17X24 6PK STRL BLUE (TOWEL DISPOSABLE) ×2 IMPLANT
WAND STAR VAC 90 (SURGICAL WAND) IMPLANT
WATER STERILE IRR 1000ML POUR (IV SOLUTION) ×2 IMPLANT

## 2012-02-12 NOTE — Anesthesia Postprocedure Evaluation (Signed)
  Anesthesia Post-op Note  Patient: Karen Gardner Thrush  Procedure(s) Performed: Procedure(s) (LRB) with comments: ARTHROSCOPY KNEE (Right) - Partial Lateral Meniscectomy, Debridement chondromalacia  Patient Location: PACU  Anesthesia Type:General  Level of Consciousness: awake and alert   Airway and Oxygen Therapy: Patient Spontanous Breathing  Post-op Pain: mild  Post-op Assessment: Post-op Vital signs reviewed, Patient's Cardiovascular Status Stable, Respiratory Function Stable, Patent Airway and No signs of Nausea or vomiting  Post-op Vital Signs: Reviewed and stable  Complications: No apparent anesthesia complications

## 2012-02-12 NOTE — Anesthesia Preprocedure Evaluation (Addendum)
Anesthesia Evaluation  Patient identified by MRN, date of birth, ID band Patient awake    Reviewed: Allergy & Precautions, H&P , NPO status , Patient's Chart, lab work & pertinent test results  Airway Mallampati: II TM Distance: >3 FB Neck ROM: Full    Dental No notable dental hx. (+) Teeth Intact and Dental Advisory Given   Pulmonary neg pulmonary ROS,  breath sounds clear to auscultation  Pulmonary exam normal       Cardiovascular hypertension, On Medications Rhythm:Regular Rate:Normal     Neuro/Psych negative neurological ROS  negative psych ROS   GI/Hepatic Neg liver ROS, GERD-  Controlled,  Endo/Other  diabetes, Type 1, Insulin Dependent  Renal/GU negative Renal ROS  negative genitourinary   Musculoskeletal   Abdominal   Peds  Hematology negative hematology ROS (+)   Anesthesia Other Findings   Reproductive/Obstetrics negative OB ROS                           Anesthesia Physical Anesthesia Plan  ASA: III  Anesthesia Plan: General   Post-op Pain Management:    Induction: Intravenous  Airway Management Planned: LMA  Additional Equipment:   Intra-op Plan:   Post-operative Plan: Extubation in OR  Informed Consent: I have reviewed the patients History and Physical, chart, labs and discussed the procedure including the risks, benefits and alternatives for the proposed anesthesia with the patient or authorized representative who has indicated his/her understanding and acceptance.   Dental advisory given  Plan Discussed with: CRNA  Anesthesia Plan Comments:         Anesthesia Quick Evaluation

## 2012-02-12 NOTE — Interval H&P Note (Signed)
History and Physical Interval Note:  02/12/2012 12:57 PM  Karen Gardner  has presented today for surgery, with the diagnosis of Right Knee Medial Mensical Tear  The various methods of treatment have been discussed with the patient and family. After consideration of risks, benefits and other options for treatment, the patient has consented to  Procedure(s) (LRB) with comments: ARTHROSCOPY KNEE (Right) as a surgical intervention .  The patient's history has been reviewed, patient examined, no change in status, stable for surgery.  I have reviewed the patient's chart and labs.  Questions were answered to the patient's satisfaction.     Nestor Lewandowsky

## 2012-02-12 NOTE — Op Note (Signed)
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  Surgeon: Feliberto Gottron. Turner Daniels M.D.  Assist: Shirl Harris PA-C  Anes: General LMA  EBL: Minimal  Fluids: 800 cc   Indications: Patient has catching popping and pain in her right knee especially along the lateral meniscal region. McMurray's test is positive.. Pt has failed conservative treatment with anti-inflammatory medicines, physical therapy, and modified activites but did get good temporarily from an intra-articular cortisone injection. Pain has recurred and patient desires elective arthroscopic evaluation and treatment of knee. Risks and benefits of surgery have been discussed and questions answered.  Procedure: Patient identified by arm band and taken to the operating room at the day surgery Center. The appropriate anesthetic monitors were attached, and General LMA anesthesia was induced without difficulty. Lateral post was applied to the table and the lower extremity was prepped and draped in usual sterile fashion from the ankle to the midthigh. Time out procedure was performed. We began the operation by making standard inferior lateral and inferior medial peripatellar portals with a #11 blade allowing introduction of the arthroscope through the inferior lateral portal and the out flow to the inferior medial portal. Pump pressure was set at 100 mmHg and diagnostic arthroscopy  revealed grade 3 chondromalacia the trochlea debrider back to see margin of 3.5 Gator sucker shaver. The medial articular and meniscal cartilages were in excellent condition, the anterior cruciate ligament and PCL were in excellent condition. On the lateral side the patient had a large complex tearing of the entire posterior lateral horns of the lateral  meniscus. This was debrided piecemeal with a large biter a large upbiter and 42 gray-white sucker shaver and a 3.5 Gator sucker shaver there was also grade 3 chondromalacia of the lateral femoral condyle and less so the lateral tibial plateau that required debridement. The knee was irrigated out normal saline solution. A dressing of xerofoam 4 x 4 dressing sponges, web roll and an Ace wrap was applied. The patient was awakened extubated and taken to the recovery without difficulty.    Signed: Nestor Lewandowsky, MD

## 2012-02-12 NOTE — Transfer of Care (Signed)
Immediate Anesthesia Transfer of Care Note  Patient: Karen Gardner  Procedure(s) Performed: Procedure(s) (LRB) with comments: ARTHROSCOPY KNEE (Right) - Partial Lateral Meniscectomy, Debridement chondromalacia  Patient Location: PACU  Anesthesia Type:General  Level of Consciousness: sedated  Airway & Oxygen Therapy: Patient Spontanous Breathing and Patient connected to face mask oxygen  Post-op Assessment: Report given to PACU RN and Post -op Vital signs reviewed and stable  Post vital signs: Reviewed and stable  Complications: No apparent anesthesia complications

## 2012-02-12 NOTE — Anesthesia Procedure Notes (Signed)
Procedure Name: LMA Insertion Date/Time: 02/12/2012 1:26 PM Performed by: Gar Gibbon Pre-anesthesia Checklist: Patient identified, Emergency Drugs available, Suction available and Patient being monitored Patient Re-evaluated:Patient Re-evaluated prior to inductionOxygen Delivery Method: Circle System Utilized Preoxygenation: Pre-oxygenation with 100% oxygen Intubation Type: IV induction Ventilation: Mask ventilation without difficulty LMA: LMA inserted LMA Size: 4.0 Number of attempts: 1 Airway Equipment and Method: bite block Placement Confirmation: positive ETCO2 Tube secured with: Tape Dental Injury: Teeth and Oropharynx as per pre-operative assessment

## 2012-02-13 ENCOUNTER — Encounter (HOSPITAL_BASED_OUTPATIENT_CLINIC_OR_DEPARTMENT_OTHER): Payer: Self-pay | Admitting: Orthopedic Surgery

## 2012-03-12 ENCOUNTER — Other Ambulatory Visit: Payer: Self-pay | Admitting: Internal Medicine

## 2012-03-12 DIAGNOSIS — Z1231 Encounter for screening mammogram for malignant neoplasm of breast: Secondary | ICD-10-CM

## 2012-03-28 ENCOUNTER — Ambulatory Visit
Admission: RE | Admit: 2012-03-28 | Discharge: 2012-03-28 | Disposition: A | Discharge status: Home / Self Care | Payer: BC Managed Care – PPO | Source: Ambulatory Visit | Attending: Internal Medicine | Admitting: Internal Medicine

## 2012-03-28 DIAGNOSIS — Z1231 Encounter for screening mammogram for malignant neoplasm of breast: Secondary | ICD-10-CM

## 2013-02-12 ENCOUNTER — Other Ambulatory Visit: Payer: Self-pay

## 2013-02-12 DIAGNOSIS — Z1231 Encounter for screening mammogram for malignant neoplasm of breast: Secondary | ICD-10-CM

## 2013-04-01 ENCOUNTER — Ambulatory Visit: Payer: BC Managed Care – PPO

## 2013-08-19 ENCOUNTER — Ambulatory Visit
Admission: RE | Admit: 2013-08-19 | Discharge: 2013-08-19 | Disposition: A | Discharge status: Home / Self Care | Payer: 59 | Source: Ambulatory Visit

## 2013-08-19 DIAGNOSIS — Z1231 Encounter for screening mammogram for malignant neoplasm of breast: Secondary | ICD-10-CM

## 2013-10-02 LAB — HEMOGLOBIN A1C: Hgb A1c MFr Bld: 8.8 % — AB (ref 4.0–6.0)

## 2013-12-29 ENCOUNTER — Emergency Department (HOSPITAL_COMMUNITY)
Admission: EM | Admit: 2013-12-29 | Discharge: 2013-12-30 | Disposition: A | Discharge status: Home / Self Care | Payer: Worker's Compensation | Attending: Emergency Medicine | Admitting: Emergency Medicine

## 2013-12-29 ENCOUNTER — Emergency Department (HOSPITAL_COMMUNITY): Payer: Worker's Compensation

## 2013-12-29 ENCOUNTER — Encounter (HOSPITAL_COMMUNITY): Payer: Self-pay | Admitting: Emergency Medicine

## 2013-12-29 DIAGNOSIS — Y9389 Activity, other specified: Secondary | ICD-10-CM | POA: Insufficient documentation

## 2013-12-29 DIAGNOSIS — S41009A Unspecified open wound of unspecified shoulder, initial encounter: Secondary | ICD-10-CM | POA: Insufficient documentation

## 2013-12-29 DIAGNOSIS — G9389 Other specified disorders of brain: Secondary | ICD-10-CM | POA: Insufficient documentation

## 2013-12-29 DIAGNOSIS — K219 Gastro-esophageal reflux disease without esophagitis: Secondary | ICD-10-CM | POA: Diagnosis not present

## 2013-12-29 DIAGNOSIS — S4980XA Other specified injuries of shoulder and upper arm, unspecified arm, initial encounter: Secondary | ICD-10-CM | POA: Diagnosis present

## 2013-12-29 DIAGNOSIS — Y92410 Unspecified street and highway as the place of occurrence of the external cause: Secondary | ICD-10-CM | POA: Insufficient documentation

## 2013-12-29 DIAGNOSIS — Z794 Long term (current) use of insulin: Secondary | ICD-10-CM | POA: Insufficient documentation

## 2013-12-29 DIAGNOSIS — Z87891 Personal history of nicotine dependence: Secondary | ICD-10-CM | POA: Diagnosis not present

## 2013-12-29 DIAGNOSIS — Z79899 Other long term (current) drug therapy: Secondary | ICD-10-CM | POA: Insufficient documentation

## 2013-12-29 DIAGNOSIS — M129 Arthropathy, unspecified: Secondary | ICD-10-CM | POA: Diagnosis not present

## 2013-12-29 DIAGNOSIS — I10 Essential (primary) hypertension: Secondary | ICD-10-CM | POA: Insufficient documentation

## 2013-12-29 DIAGNOSIS — S41001A Unspecified open wound of right shoulder, initial encounter: Secondary | ICD-10-CM

## 2013-12-29 DIAGNOSIS — Z862 Personal history of diseases of the blood and blood-forming organs and certain disorders involving the immune mechanism: Secondary | ICD-10-CM | POA: Diagnosis not present

## 2013-12-29 DIAGNOSIS — W208XXA Other cause of strike by thrown, projected or falling object, initial encounter: Secondary | ICD-10-CM

## 2013-12-29 DIAGNOSIS — S46909A Unspecified injury of unspecified muscle, fascia and tendon at shoulder and upper arm level, unspecified arm, initial encounter: Secondary | ICD-10-CM | POA: Diagnosis present

## 2013-12-29 DIAGNOSIS — S0993XA Unspecified injury of face, initial encounter: Secondary | ICD-10-CM | POA: Insufficient documentation

## 2013-12-29 DIAGNOSIS — S199XXA Unspecified injury of neck, initial encounter: Secondary | ICD-10-CM

## 2013-12-29 DIAGNOSIS — E119 Type 2 diabetes mellitus without complications: Secondary | ICD-10-CM | POA: Insufficient documentation

## 2013-12-29 LAB — CBG MONITORING, ED: Glucose-Capillary: 103 mg/dL — ABNORMAL HIGH (ref 70–99)

## 2013-12-29 LAB — I-STAT CREATININE, ED: Creatinine, Ser: 1.2 mg/dL — ABNORMAL HIGH (ref 0.50–1.10)

## 2013-12-29 MED ORDER — LORAZEPAM 2 MG/ML IJ SOLN
1.0000 mg | Freq: Once | INTRAMUSCULAR | Status: AC
  Administered 2013-12-29: 1 mg via INTRAVENOUS
  Filled 2013-12-29: qty 1

## 2013-12-29 MED ORDER — GADOBENATE DIMEGLUMINE 529 MG/ML IV SOLN
20.0000 mL | Freq: Once | INTRAVENOUS | Status: AC | PRN
  Administered 2013-12-29: 20 mL via INTRAVENOUS

## 2013-12-29 MED ORDER — ACETAMINOPHEN 500 MG PO TABS
1000.0000 mg | ORAL_TABLET | Freq: Once | ORAL | Status: AC
  Administered 2013-12-29: 1000 mg via ORAL
  Filled 2013-12-29: qty 2
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
## 2013-12-29 NOTE — ED Notes (Signed)
Checked blood sugar due to patient feeling like it was dropping.. I got the doctors approval.

## 2013-12-30 NOTE — Discharge Instructions (Signed)
Acromioclavicular Injuries °The AC (acromioclavicular) joint is the joint in the shoulder where the collarbone (clavicle) meets the shoulder blade (scapula). The part of the shoulder blade connected to the collarbone is called the acromion. Common problems with and treatments for the AC joint are detailed below. °ARTHRITIS °Arthritis occurs when the joint has been injured and the smooth padding between the joints (cartilage) is lost. This is the wear and tear seen in most joints of the body if they have been overused. This causes the joint to produce pain and swelling which is worse with activity.  °AC JOINT SEPARATION °AC joint separation means that the ligaments connecting the acromion of the shoulder blade and collarbone have been damaged, and the two bones no longer line up. AC separations can be anywhere from mild to severe, and are "graded" depending upon which ligaments are torn and how badly they are torn. °· Grade I Injury: the least damage is done, and the AC joint still lines up. °· Grade II Injury: damage to the ligaments which reinforce the AC joint. In a Grade II injury, these ligaments are stretched but not entirely torn. When stressed, the AC joint becomes painful and unstable. °· Grade III Injury: AC and secondary ligaments are completely torn, and the collarbone is no longer attached to the shoulder blade. This results in deformity; a prominence of the end of the clavicle. °AC JOINT FRACTURE °AC joint fracture means that there has been a break in the bones of the AC joint, usually the end of the clavicle. °TREATMENT °TREATMENT OF AC ARTHRITIS °· There is currently no way to replace the cartilage damaged by arthritis. The best way to improve the condition is to decrease the activities which aggravate the problem. Application of ice to the joint helps decrease pain and soreness (inflammation). The use of non-steroidal anti-inflammatory medication is helpful. °· If less conservative measures do not  work, then cortisone shots (injections) may be used. These are anti-inflammatories; they decrease the soreness in the joint and swelling. °· If non-surgical measures fail, surgery may be recommended. The procedure is generally removal of a portion of the end of the clavicle. This is the part of the collarbone closest to your acromion which is stabilized with ligaments to the acromion of the shoulder blade. This surgery may be performed using a tube-like instrument with a light (arthroscope) for looking into a joint. It may also be performed as an open surgery through a small incision by the surgeon. Most patients will have good range of motion within 6 weeks and may return to all activity including sports by 8-12 weeks, barring complications. °TREATMENT OF AN AC SEPARATION °· The initial treatment is to decrease pain. This is best accomplished by immobilizing the arm in a sling and placing an ice pack to the shoulder for 20 to 30 minutes every 2 hours as needed. As the pain starts to subside, it is important to begin moving the fingers, wrist, elbow and eventually the shoulder in order to prevent a stiff or "frozen" shoulder. Instruction on when and how much to move the shoulder will be provided by your caregiver. The length of time needed to regain full motion and function depends on the amount or grade of the injury. Recovery from a Grade I AC separation usually takes 10 to 14 days, whereas a Grade III may take 6 to 8 weeks. °· Grade I and II separations usually do not require surgery. Even Grade III injuries usually allow return to full   activity with few restrictions. Treatment is also based on the activity demands of the injured shoulder. For example, a high level quarterback with an injured throwing arm will receive more aggressive treatment than someone with a desk job who rarely uses his/her arm for strenuous activities. In some cases, a painful lump may persist which could require a later surgery. Surgery  can be very successful, but the benefits must be weighed against the potential risks. °TREATMENT OF AN AC JOINT FRACTURE °Fracture treatment depends on the type of fracture. Sometimes a splint or sling may be all that is required. Other times surgery may be required for repair. This is more frequently the case when the ligaments supporting the clavicle are completely torn. Your caregiver will help you with these decisions and together you can decide what will be the best treatment. °HOME CARE INSTRUCTIONS  °· Apply ice to the injury for 15-20 minutes each hour while awake for 2 days. Put the ice in a plastic bag and place a towel between the bag of ice and skin. °· If a sling has been applied, wear it constantly for as long as directed by your caregiver, even at night. The sling or splint can be removed for bathing or showering or as directed. Be sure to keep the shoulder in the same place as when the sling is on. Do not lift the arm. °· If a figure-of-eight splint has been applied it should be tightened gently by another person every day. Tighten it enough to keep the shoulders held back. Allow enough room to place the index finger between the body and strap. Loosen the splint immediately if there is numbness or tingling in the hands. °· Take over-the-counter or prescription medicines for pain, discomfort or fever as directed by your caregiver. °· If you or your child has received a follow up appointment, it is very important to keep that appointment in order to avoid long term complications, chronic pain or disability. °SEEK MEDICAL CARE IF:  °· The pain is not relieved with medications. °· There is increased swelling or discoloration that continues to get worse rather than better. °· You or your child has been unable to follow up as instructed. °· There is progressive numbness and tingling in the arm, forearm or hand. °SEEK IMMEDIATE MEDICAL CARE IF:  °· The arm is numb, cold or pale. °· There is increasing pain  in the hand, forearm or fingers. °MAKE SURE YOU:  °· Understand these instructions. °· Will watch your condition. °· Will get help right away if you are not doing well or get worse. °Document Released: 12/28/2004 Document Revised: 06/12/2011 Document Reviewed: 06/22/2008 °[employer]® Patient Information ©2015 [employer]. This information is not intended to replace advice given to you by your health care provider. Make sure you discuss any questions you have with your health care provider. ° °

## 2014-01-21 ENCOUNTER — Encounter: Payer: Self-pay | Admitting: Endocrinology

## 2014-01-21 ENCOUNTER — Encounter: Payer: 59 | Attending: Endocrinology | Admitting: Nutrition

## 2014-01-21 ENCOUNTER — Ambulatory Visit (INDEPENDENT_AMBULATORY_CARE_PROVIDER_SITE_OTHER): Payer: 59 | Admitting: Endocrinology

## 2014-01-21 VITALS — BP 126/83 | HR 78 | Temp 98.5°F | Resp 16 | Ht 65.5 in | Wt 230.6 lb

## 2014-01-21 DIAGNOSIS — I10 Essential (primary) hypertension: Secondary | ICD-10-CM

## 2014-01-21 DIAGNOSIS — G5603 Carpal tunnel syndrome, bilateral upper limbs: Secondary | ICD-10-CM

## 2014-01-21 DIAGNOSIS — E119 Type 2 diabetes mellitus without complications: Secondary | ICD-10-CM | POA: Insufficient documentation

## 2014-01-21 DIAGNOSIS — Z713 Dietary counseling and surveillance: Secondary | ICD-10-CM | POA: Insufficient documentation

## 2014-01-21 DIAGNOSIS — G5601 Carpal tunnel syndrome, right upper limb: Secondary | ICD-10-CM

## 2014-01-21 DIAGNOSIS — Z794 Long term (current) use of insulin: Secondary | ICD-10-CM | POA: Insufficient documentation

## 2014-01-21 DIAGNOSIS — E1165 Type 2 diabetes mellitus with hyperglycemia: Secondary | ICD-10-CM | POA: Insufficient documentation

## 2014-01-21 DIAGNOSIS — IMO0002 Reserved for concepts with insufficient information to code with codable children: Secondary | ICD-10-CM

## 2014-01-21 DIAGNOSIS — G5602 Carpal tunnel syndrome, left upper limb: Secondary | ICD-10-CM

## 2014-01-21 DIAGNOSIS — E012 Iodine-deficiency related (endemic) goiter, unspecified: Secondary | ICD-10-CM

## 2014-01-21 DIAGNOSIS — E040 Nontoxic diffuse goiter: Secondary | ICD-10-CM

## 2014-01-21 LAB — GLUCOSE, POCT (MANUAL RESULT ENTRY): POC GLUCOSE: 130 mg/dL — AB (ref 70–99)

## 2014-01-21 NOTE — Progress Notes (Signed)
Karen Gardner was instructed on how to fill, apply and use the V-go.  She re demonstrated how to fill a V-go using Novolog insulin, and following the directions for use in the starter kit. She had no final questions about how to use it.    She will not insert and start the V-go until tomorrow AM.  She was reminded to DC her Levemir insulin and not take any more.  She reported good understanding of this.    She was told to test her blood sugars before each meal and at bedtime,and to call the results into Dr. Ronnie Derby office on Friday.  She agreed to do this.  She was also told to call [employer] customer care, to find out how much these supplies will cost her, before calling on Friday.  She was given the telephone number to do this.  She will let Suanne Marker know if she wants to continue this therapy on Friday.    She had no final questions.

## 2014-01-21 NOTE — Patient Instructions (Addendum)
Check sugar before each meal and about 6-7 pm daily Call blood sugar readings on Friday Start the V-go pump tomorrow morning  Let's start with one click before breakfast, 2 clicks  before lunch and 3 before supper. Preferably do the clicks 5-10 minutes before planning to eat Continue Bydureon

## 2014-01-21 NOTE — Progress Notes (Signed)
Patient ID: Karen Gardner, female   DOB: Aug 24, 1955, 58 y.o.   MRN: [id number]           Reason for Appointment: Consultation for Type 2 Diabetes  Referring physician: [name]   History of Present Illness:          Diagnosis: Type 2 diabetes mellitus, date of diagnosis: 1990        Past history:  She was initially diagnosed when she had symptoms of increased thirst and weight loss; prior to diagnosis her weight was 310 She was initially treated with metformin only and subsequently Actos was added Not clear of her level of control had been good with oral hypoglycemic drugs previously A few years ago she was given Byetta with some improvement in her blood sugars and weight She thinks her Byetta was stopped by her PCP because of possible side effects of abdominal pain She was then switched to Levemir about a year or so ago She was also taken off her Actos at bedtime Metformin was probably stopped 1-2 years ago reportedly because of renal dysfunction Her A1c in 3/14 was 7.8 but she thinks her blood sugars have been fluctuating significantly, glucose in 3/15 was 339  Recent history:   She thinks her blood sugars have been poorly controlled for about a year even with taking Levemir insulin and increasing the dose A1c had increased to 8.8 in July She was just started on Bydureon about a week ago by her PCP and referred here for further management She is using a generic monitor for checking her blood sugar and usually checks before breakfast and supper Apparently her fasting blood sugars are excellent but her blood sugars are progressively higher later in the day although variable She does think she gets more thirsty at times and has some fatigue Over the last couple of years has gained significant amount of weight with starting insulin      Oral hypoglycemic drugs the patient is taking are: None      Side effects from medications have been: None INSULIN regimen is  described [id number] 15 twice a day usually    Compliance with the medical regimen: Fair Hypoglycemia: Occasionally at 4-5 am   Glucose monitoring:  done 2x time a day         Glucometer:  generic   Blood Glucose readings by recall:  PREMEAL Breakfast Lunch Dinner Bedtime  Overall   Glucose range: 85 250 300 400   Median:         Self-care: The diet that the patient has been following is: tries to limit fat intake.     Meals: 3 meals per day. Breakfast is English muffin and [place] sausage; eating out about 3 days a week           Exercise:  None because of knee pain         Dietician visit, most recent: Several years ago            Weight history: 175-310 in the past  Wt Readings from Last 3 Encounters:  01/21/14 230 lb 9.6 oz (104.599 kg)  12/29/13 235 lb (106.595 kg)  02/12/12 202 lb (91.627 kg)    Glycemic control:     Lab Results  Component Value Date   HGBA1C 8.8* 10/02/2013   Lab Results  Component Value Date   CREATININE 1.20* 12/29/2013   Serum creatinine 1.3 on 01/13/14      Medication List  This list is accurate as of: 01/21/14 12:56 PM.  Always use your most recent med list.               escitalopram 10 MG tablet  Commonly known as:  LEXAPRO  Take 10 mg by mouth daily.     etodolac 400 MG tablet  Commonly known as:  LODINE  Take 400 mg by mouth as needed.     Exenatide ER 2 MG Pen  Inject into the skin.     ibuprofen 200 MG tablet  Commonly known as:  ADVIL,MOTRIN  Take 400 mg by mouth every 6 (six) hours as needed for mild pain.     insulin detemir 100 UNIT/ML injection  Commonly known as:  LEVEMIR  Inject 15 Units into the skin 2 (two) times daily.     lansoprazole 30 MG capsule  Commonly known as:  PREVACID  Take 30 mg by mouth daily.     loratadine 10 MG tablet  Commonly known as:  CLARITIN  Take 10 mg by mouth 2 (two) times daily.     nabumetone 750 MG tablet  Commonly known as:  RELAFEN  Take 750 mg by mouth as needed.       valsartan-hydrochlorothiazide 80-12.5 MG per tablet  Commonly known as:  DIOVAN-HCT  Take 1 tablet by mouth daily.        Allergies:  Allergies  Allergen Reactions  . Lactose Intolerance (Gi)   . Oatmeal     Past Medical History  Diagnosis Date  . Hypertension   . Diabetes mellitus without complication   . Arthritis   . Anemia     has had to have iron infusions-sees dr Twanna Hy  . GERD (gastroesophageal reflux disease)   . Numbness in both hands     mostly at night    Past Surgical History  Procedure Laterality Date  . Hernia repair      umb hernia as child  . Colonoscopy    . Upper gi endoscopy    . Knee arthroscopy  02/12/2012    Procedure: ARTHROSCOPY KNEE;  Surgeon: Nestor Lewandowsky, MD;  Location: [facility];  Service: Orthopedics;  Laterality: Right;  Partial Lateral Meniscectomy, Debridement chondromalacia    No family history on file.  Social History:  reports that she quit smoking about 41 years ago. She does not have any smokeless tobacco history on file. She reports that she does not drink alcohol or use illicit drugs.    Review of Systems       Vision is normal. She has regular eye exams with a retina specialist, not clear if she had laser treatment for neuropathy previously       Lipids: No levels available from review of labs for the last year from PCP. She was told levels were borderline       No results found for this basename: CHOL,  HDL,  LDLCALC,  LDLDIRECT,  TRIG,  CHOLHDL                  Skin: No rash or infections     Thyroid:  No  unusual fatigue no history of thyroid disease     The blood pressure has been controlled with vacation for several years; is taking Diovan HCT      No swelling of feet.     No shortness of breath or chest tightness  on exertion.     Bowel habits: Normal.  Knee joint  pains especially on the right side present, currently has severe osteoarthritis limiting her mobility.          No  history of Numbness, tingling or burning in feet    Hands get numb and tingly usually during the night and she feels better when she dangles them. Not much difficulty during the day. Does work on the computer a lot    LABS:  Office Visit on 01/21/2014  Component Date Value Ref Range Status  . POC Glucose 01/21/2014 130* 70 - 99 mg/dl Final  . Hemoglobin [id number] 10/02/2013 8.8* 4.0 - 6.0 % Final    Physical Examination:  BP 126/83  Pulse 78  Temp(Src) 98.5 F (36.9 C)  Resp 16  Ht 5' 5.5" (1.664 m)  Wt 230 lb 9.6 oz (104.599 kg)  BMI 37.78 kg/m2  SpO2 98%  GENERAL:         Patient has generalized obesity.  no cushingoid features HEENT:         Eye exam shows normal external appearance. Fundus exam shows no retinopathy. Oral exam shows normal mucosa .  NECK:         General:  Neck exam shows no lymphadenopathy. Carotids are normal to palpation and no bruit heard.  Thyroid is just palpable, of about 1-1/2 times enlarged on the right side, left side not palpable and no nodules felt.   LUNGS:         Chest is symmetrical. Lungs are clear to auscultation.Marland Kitchen.   HEART:         Heart sounds:  S1 and S2 are normal. No murmurs or clicks heard., no S3 or S4.   ABDOMEN:   There is no distention present. Liver and spleen are not palpable. No other mass or tenderness present.  EXTREMITIES:     There is no edema. No skin lesions present.Marland Kitchen.  NEUROLOGICAL:   Vibration sense is  moderately reduced in right toes and mildly on the left. Ankle jerks are 1+ bilaterally.          Diabetic foot exam shows normal monofilament sensation in the toes and plantar surfaces, no skin lesions or ulcers on the feet and normal pedal pulses Bilateral Tinel's sign positive address MUSCULOSKELETAL:       There is no enlargement or deformity of the joints. Spine is normal to inspection.Marland Kitchen.   SKIN:       No rash or lesions, no acanthosis    ASSESSMENT:  Diabetes type 2, uncontrolled with obesity and BMI 38    She has poor  control with last A1c 8.8 and she reports blood sugars as high as 400 after meals Currently taking only basal insulin and has low normal fasting readings and also occasional nocturnal hypoglycemia with this However she has not been on mealtime insulin or any insulin sensitizers Since she has had diabetes for about 15 years is likely to be insulin deficient She has very limited ability to exercise and is gaining weight She has just started taking Bydureon which may help with weight loss and some improvement in postprandial readings She has not had any diabetes education or nutritional counseling in quite sometime  Complications: ? Retinopathy. Need to evaluate urine microalbumin for nephropathy No significant peripheral neuropathy  High normal creatinine of 1.3 recently but this does not preclude use of metformin; some of her high creatinine may be related to continued use of nonsteroidal anti-inflammatory drugs  Hypertension: Appears well controlled  Unknown lipid status  She has bilateral carpal tunnel syndrome, mostly with nocturnal symptoms  Small right-sided goiter: We'll need to check her thyroid levels, no recent TSH available  PLAN:   Stop Levemir and start V.-go pump with 20 units basal. Discussed in detail how this will deliver insulin and will cover her basal and mealtime requirements with the infusion and bolus functions. She will be instructed in detail by the nurse educator today and she will start the pump tomorrow  She will start with 2 units bolus for breakfast, 4 units for lunch and 6 units for dinner  She will call her blood sugar readings to us in 2 days to review bolus doses  Start using one touch meter and check 4 times a day for now  Continue Bydureon for now  Followup in one week for review of blood sugars  Consider consultation with dietitian  Followup with primary care physician for management of carpal tunnel syndrome  She will need fasting lipids once  her glucose control is improved  Followup in one week  Recommend reducing or eliminating nonsteroidal anti-inflammatory drugs  Counseling time over 50% of today's 60 minute visit  Can Lucci 01/21/2014, 12:56 PM   Note: This office note was prepared with Insurance underwriterDragon voice recognition system technology. Any transcriptional errors that result from this process are unintentional.

## 2014-01-21 NOTE — Patient Instructions (Signed)
Insert the fill V-go tomorrow morning Stop taking the Levemir tonight.  Fill and insert a new V-go every morning. Read over the directions for use, if questions. Test blood sugars before meals and at bedtime Call blood sugars on Friday

## 2014-01-26 ENCOUNTER — Telehealth: Payer: Self-pay | Admitting: Endocrinology

## 2014-01-26 ENCOUNTER — Telehealth: Payer: Self-pay | Admitting: Nutrition

## 2014-01-26 NOTE — Telephone Encounter (Signed)
Confirm that she is taking 2 units bolus for breakfast, 4 units for lunch and 6 units for dinner Need to have her come in this week for review

## 2014-01-26 NOTE — Telephone Encounter (Signed)
Pt calling to give blood sugar readings  01/22/14 AM 141; lunch 146; PM 90 10/23 AM 77; lunch 231; 2 pm 278; 5 pm 171; bedtime 278 10/24 AM 246; Lunch 456; PM 543 01/25/14 AM 171; lunch in meeting; PM 409 10/26 176 am

## 2014-01-26 NOTE — Telephone Encounter (Signed)
Message left on my machine of her blood sugars since starting the V-Go: 01/22/14:  FBS: 141(9AM-acB),  175 (2hr. PcB),  AcL: 136, 2hr. PcL: 145,  AcS: 88, 2hr. PcS: 98,  01/23/14:  FBS: 77,  AcL:231, 2hr. PcL: 231

## 2014-01-26 NOTE — Telephone Encounter (Signed)
Please see below and advise.

## 2014-01-26 NOTE — Telephone Encounter (Signed)
She should have an appointment this week ASAP to review her progress before prescribing the pump, please call

## 2014-01-27 NOTE — Telephone Encounter (Signed)
Patient is scheduled for 10/28 @ 9:45

## 2014-01-27 NOTE — Telephone Encounter (Signed)
Has an appt. with you on 11/2.  She could not get one any sooner.

## 2014-01-28 ENCOUNTER — Encounter: Payer: Self-pay | Admitting: Endocrinology

## 2014-01-28 ENCOUNTER — Ambulatory Visit (INDEPENDENT_AMBULATORY_CARE_PROVIDER_SITE_OTHER): Payer: 59 | Admitting: Endocrinology

## 2014-01-28 ENCOUNTER — Other Ambulatory Visit: Payer: Self-pay | Admitting: *Deleted

## 2014-01-28 VITALS — BP 127/77 | HR 95 | Temp 97.8°F | Resp 16 | Ht 65.25 in | Wt 229.6 lb

## 2014-01-28 DIAGNOSIS — IMO0002 Reserved for concepts with insufficient information to code with codable children: Secondary | ICD-10-CM

## 2014-01-28 DIAGNOSIS — I10 Essential (primary) hypertension: Secondary | ICD-10-CM | POA: Insufficient documentation

## 2014-01-28 DIAGNOSIS — E1165 Type 2 diabetes mellitus with hyperglycemia: Secondary | ICD-10-CM

## 2014-01-28 MED ORDER — V-GO 20 KIT: PACK | Status: DC

## 2014-01-28 MED ORDER — INSULIN ASPART 100 UNIT/ML ~~LOC~~ SOLN: SUBCUTANEOUS | Status: DC

## 2014-01-28 MED ORDER — ONETOUCH DELICA LANCETS FINE MISC: Status: DC

## 2014-01-28 MED ORDER — GLUCOSE BLOOD VI STRP: ORAL_STRIP | Status: DC

## 2014-01-28 NOTE — Patient Instructions (Signed)
V-Go clicks 1 in am 2 at lunch and 2 at supper  Call readings next week  Call if am sugar <80

## 2014-01-28 NOTE — Progress Notes (Signed)
Patient ID: Karen Gardner, female   DOB: 1955/11/16, 58 y.o.   MRN: [id number]           Reason for Appointment: Followup for Type 2 Diabetes  Referring physician: Lorenda Ishiharaupashree Varadarajan   History of Present Illness:          Diagnosis: Type 2 diabetes mellitus, date of diagnosis: 1990        Past history:  She was initially diagnosed when she had symptoms of increased thirst and weight loss; prior to diagnosis her weight was 310 She was initially treated with metformin only and subsequently Actos was added Not clear of her level of control had been good with oral hypoglycemic drugs previously A few years ago she was given Byetta with some improvement in her blood sugars and weight She thinks her Byetta was stopped by her PCP because of possible side effects of abdominal pain She was then switched to Levemir in 2014 and also taken off her Actos  Metformin was probably stopped 1-2 years ago reportedly because of renal dysfunction Her A1c in 3/14 was 7.8 but she thinks her blood sugars have been fluctuating significantly, glucose in 3/15 was 339  Recent history:    Her blood sugars had been poorly controlled for about a year with taking Levemir insulin   A1c had increased to 8.8 in July 2015 She was just started on Bydureon around 01/15/14 by her PCP and referred here for further management Over the last couple of years has gained significant amount of weight with starting insulin  Because of poor control and significant postprandial hyperglycemia she was taken off the Levemir insulin and started on the V.-go pump last week. She was started on the 20 units of basal along with mealtime boluses of 2-4-6 units She has had variable blood sugars with this and apparently she had had some difficulties with the proper use of the pump and a couple of times had pressed the wrong button probably taking out the needle and blood sugars went up to around 500 in the evening However in the last 1-1/2  days her blood sugars have been better She was told to increase her boluses to 2-6-8 units With this her blood sugars were relatively low yesterday and she felt exhausted, feels better today      Oral hypoglycemic drugs the patient is taking are: None      Side effects from medications have been: None INSULIN regimen is described as: As above    Compliance with the medical regimen: Fair Hypoglycemia: Yesterday at lunch  Glucose monitoring:  done 2x time a day         Glucometer:  One Touch Verio   Blood Glucose readings by record: Yesterday: 87--60--78--89 Today: 127 fasting  Self-care: The diet that the patient has been following is: tries to limit fat intake.     Meals: 3 meals per day. Breakfast is English muffin and [place] sausage; eating out about 3 days a week           Exercise:  None because of knee pain         Dietician visit, most recent: Several years ago            Weight history: 175-310 in the past  Wt Readings from Last 3 Encounters:  01/28/14 229 lb 9.6 oz (104.146 kg)  01/21/14 230 lb 9.6 oz (104.599 kg)  12/29/13 235 lb (106.595 kg)    Glycemic control:     Lab  Results  Component Value Date   HGBA1C 8.8* 10/02/2013   Lab Results  Component Value Date   CREATININE 1.20* 12/29/2013   Serum creatinine 1.3 on 01/13/14      Medication List       This list is accurate as of: 01/28/14 11:09 AM.  Always use your most recent med list.               escitalopram 10 MG tablet  Commonly known as:  LEXAPRO  Take 10 mg by mouth daily.     etodolac 400 MG tablet  Commonly known as:  LODINE  Take 400 mg by mouth as needed.     Exenatide ER 2 MG Pen  Inject into the skin.     ibuprofen 200 MG tablet  Commonly known as:  ADVIL,MOTRIN  Take 400 mg by mouth every 6 (six) hours as needed for mild pain.     insulin detemir 100 UNIT/ML injection  Commonly known as:  LEVEMIR  Inject 15 Units into the skin 2 (two) times daily.     lansoprazole 30 MG  capsule  Commonly known as:  PREVACID  Take 30 mg by mouth daily.     loratadine 10 MG tablet  Commonly known as:  CLARITIN  Take 10 mg by mouth 2 (two) times daily.     nabumetone 750 MG tablet  Commonly known as:  RELAFEN  Take 750 mg by mouth as needed.     valsartan-hydrochlorothiazide 80-12.5 MG per tablet  Commonly known as:  DIOVAN-HCT  Take 1 tablet by mouth daily.        Allergies:  Allergies  Allergen Reactions  . Lactose Intolerance (Gi)   . Oatmeal     Past Medical History  Diagnosis Date  . Hypertension   . Diabetes mellitus without complication   . Arthritis   . Anemia     has had to have iron infusions-sees dr Twanna Hyenever  . GERD (gastroesophageal reflux disease)   . Numbness in both hands     mostly at night    Past Surgical History  Procedure Laterality Date  . Hernia repair      umb hernia as child  . Colonoscopy    . Upper gi endoscopy    . Knee arthroscopy  02/12/2012    Procedure: ARTHROSCOPY KNEE;  Surgeon: Nestor LewandowskyFrank J Rowan, MD;  Location: [facility];  Service: Orthopedics;  Laterality: Right;  Partial Lateral Meniscectomy, Debridement chondromalacia    No family history on file.  Social History:  reports that she quit smoking about 42 years ago. She does not have any smokeless tobacco history on file. She reports that she does not drink alcohol or use illicit drugs.    Review of Systems       Vision is normal. She has regular eye exams with a retina specialist, not clear if she had laser treatment for neuropathy previously       Lipids: No levels available from review of labs for the last year from PCP. She was told levels were borderline       No results found for this basename: CHOL,  HDL,  LDLCALC,  LDLDIRECT,  TRIG,  CHOLHDL                 LABS:  No visits with results within 1 Week(s) from this visit. Latest known visit with results is:  Office Visit on 01/21/2014  Component Date Value Ref Range Status  . POC  Glucose 01/21/2014 130* 70 - 99 mg/dl Final  . Hemoglobin Z6X 10/02/2013 8.8* 4.0 - 6.0 % Final    Physical Examination:  BP 127/77  Pulse 95  Temp(Src) 97.8 F (36.6 C)  Resp 16  Ht 5' 5.25" (1.657 m)  Wt 229 lb 9.6 oz (104.146 kg)  BMI 37.93 kg/m2  SpO2 97%    ASSESSMENT:  Diabetes type 2, uncontrolled with obesity and BMI 38    She has had some difficulty starting the V.-go  pump with inadequate delivery of the insulin from technical reasons but since yesterday her blood sugars have been excellent Even though her mealtime boluses were increased because of high postprandial readings they appear to be excessive at this time and she had hypoglycemia yesterday at least at lunchtime Previously has had markedly increased blood sugars with just taking basal insulin  Also she has started Bydureon which may be helping also   PLAN:   She was instructed by nurse educator on the proper technique of applying the V. go pump, site rotation and use of the various buttons  Since her fasting blood sugars fairly good at 127 today will continue the 20 units of basal  She will reduce her boluses to 2 units at breakfast and 4 units at lunch and supper for now  She will call blood sugar readings next week for further adjustment; however she will need to possibly stop the pump if her overnight blood sugars get low  Continue Bydureon   Ileta Ofarrell 01/28/2014, 11:09 AM   Note: This office note was prepared with Insurance underwriter. Any transcriptional errors that result from this process are unintentional.

## 2014-01-30 ENCOUNTER — Telehealth: Payer: Self-pay | Admitting: Endocrinology

## 2014-01-30 NOTE — Telephone Encounter (Signed)
Patient asked if  She can take the Humalog, insurance will not cover Novalog. Please advise

## 2014-02-02 ENCOUNTER — Ambulatory Visit: Payer: 59 | Admitting: Endocrinology

## 2014-02-04 ENCOUNTER — Other Ambulatory Visit: Payer: Self-pay | Admitting: *Deleted

## 2014-02-04 ENCOUNTER — Telehealth: Payer: Self-pay | Admitting: Endocrinology

## 2014-02-04 MED ORDER — GLUCOSE BLOOD VI STRP: ORAL_STRIP | Status: AC

## 2014-02-04 NOTE — Telephone Encounter (Signed)
rx sent

## 2014-02-04 NOTE — Telephone Encounter (Signed)
Patient called stating that [place] is [place] and she had left her Verio test strips at home   [facility] [phone]   Please send asap per patient   Thank you

## 2014-02-09 ENCOUNTER — Other Ambulatory Visit: Payer: Self-pay | Admitting: Orthopaedic Surgery

## 2014-02-09 ENCOUNTER — Telehealth: Payer: Self-pay | Admitting: *Deleted

## 2014-02-09 DIAGNOSIS — M545 Low back pain: Secondary | ICD-10-CM

## 2014-02-09 NOTE — Telephone Encounter (Signed)
Can be done on a Tuesday at 12:45 pm

## 2014-02-09 NOTE — Telephone Encounter (Signed)
Patients insurance company called, they are denying the V-Go pump because their medical director said it was no proven to help with Diabetes.   Phone # is [phone] if you wanted to set up and appointment with their peer to peer committee. Please advise.

## 2014-02-10 ENCOUNTER — Other Ambulatory Visit: Payer: Self-pay | Admitting: Orthopedic Surgery

## 2014-02-10 NOTE — H&P (Signed)
Karen Gardner is an 58 y.o. female.   Chief Complaint: Right ankle pain  HPI: Patient was on a trip to [place] for 5 days ago slipped and fell on the street getting onto a curb and sustained a bimalleolar fracture, minimally displaced of her right ankle.  She was placed in a posterior splint and is swollen back to [place].  She denies any loss of consciousness or other injuries.  She reports severe pain that is controlled with Percocet 5 mg level.  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  She is able ambulate with a walker.  Past Medical History  Diagnosis Date  . Hypertension   . Diabetes mellitus without complication   . Arthritis   . Anemia     has had to have iron infusions-sees dr Twanna Hyenever  . GERD (gastroesophageal reflux disease)   . Numbness in both hands     mostly at night    Past Surgical History  Procedure Laterality Date  . Hernia repair      umb hernia as child  . Colonoscopy    . Upper gi endoscopy    . Knee arthroscopy  02/12/2012    Procedure: ARTHROSCOPY KNEE;  Surgeon: Nestor LewandowskyFrank J Rowan, MD;  Location: [facility];  Service: Orthopedics;  Laterality: Right;  Partial Lateral Meniscectomy, Debridement chondromalacia    No family history on file. Social History:  reports that she quit smoking about 42 years ago. She does not have any smokeless tobacco history on file. She reports that she does not drink alcohol or use illicit drugs.  Allergies:  Allergies  Allergen Reactions  . Lactose Intolerance (Gi)   . Oatmeal     No prescriptions prior to admission    No results found for this or any previous visit (from the past 48 hour(s)). No results found.  Review of Systems  Constitutional: Positive for malaise/fatigue.  HENT: Positive for congestion.   Eyes: Negative.   Respiratory: Negative.   Gastrointestinal: Positive for heartburn and abdominal pain.  Genitourinary:  Negative.   Musculoskeletal: Positive for joint pain.  Skin: Negative.   Neurological: Negative.   Endo/Heme/Allergies: Bruises/bleeds easily.  Psychiatric/Behavioral: Positive for memory loss. The patient has insomnia.     There were no vitals taken for this visit. Physical Exam  Constitutional: She is oriented to person, place, and time. She appears well-developed and well-nourished.  HENT:  Head: Normocephalic and atraumatic.  Eyes: Pupils are equal, round, and reactive to light.  Neck: Normal range of motion. Neck supple.  Cardiovascular: Intact distal pulses.   Respiratory: Effort normal.  Musculoskeletal: She exhibits tenderness.  The posterior splint is removed the ankle is 2-3+ swollen.  There are no fracture blisters.  Toes are well-perfused.  She is neurovascularly intact.  She is quite tender to palpation over the medial and lateral malleolus.  The ankle does not appear to be dislocated.    Neurological: She is alert and oriented to person, place, and time.  Skin: Skin is warm and dry.  Psychiatric: She has a normal mood and affect. Her behavior is normal. Judgment and thought content normal.     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  Plan: Risks and benefits of open reduction internal fixation were discussed at length and we'll get her ready for this in the next couple of days.  In the meantime, she'll  be placed in a Cam Walker boot with inflatable ladders for compression, Percocet 10 mg by mouth every 4-6 hours when necessary dispense 60 no refills, as much is possible she should elevate and ice the limb to diminish swelling prior to surgery.  She may require overnight stay  [name] R 02/10/2014, 5:29 PM

## 2014-02-10 NOTE — Progress Notes (Signed)
Spoke with Dr. Joslin (anesthesia)  regarding pt being on an insulin pump for Type 2 Diabetes ( Insulin Disposable Pump (V-GO 20) KIT). MD advised that pt turn off pump at midnight. Pt made aware and verbalized understanding. 

## 2014-02-11 ENCOUNTER — Encounter (HOSPITAL_COMMUNITY)
Admission: RE | Disposition: A | Discharge status: Skilled Nursing Facility | Payer: Self-pay | Source: Ambulatory Visit | Attending: Orthopedic Surgery

## 2014-02-11 ENCOUNTER — Encounter (HOSPITAL_COMMUNITY): Payer: Self-pay | Admitting: Surgery

## 2014-02-11 ENCOUNTER — Ambulatory Visit (HOSPITAL_COMMUNITY): Payer: 59

## 2014-02-11 ENCOUNTER — Observation Stay (HOSPITAL_COMMUNITY)
Admission: RE | Admit: 2014-02-11 | Discharge: 2014-02-14 | Disposition: A | Discharge status: Skilled Nursing Facility | Payer: 59 | Source: Ambulatory Visit | Attending: Orthopedic Surgery | Admitting: Orthopedic Surgery

## 2014-02-11 ENCOUNTER — Ambulatory Visit (HOSPITAL_COMMUNITY): Payer: 59 | Admitting: Anesthesiology

## 2014-02-11 DIAGNOSIS — S82843A Displaced bimalleolar fracture of unspecified lower leg, initial encounter for closed fracture: Secondary | ICD-10-CM | POA: Diagnosis present

## 2014-02-11 DIAGNOSIS — W1789XA Other fall from one level to another, initial encounter: Secondary | ICD-10-CM | POA: Diagnosis not present

## 2014-02-11 DIAGNOSIS — Z91018 Allergy to other foods: Secondary | ICD-10-CM | POA: Diagnosis not present

## 2014-02-11 DIAGNOSIS — I129 Hypertensive chronic kidney disease with stage 1 through stage 4 chronic kidney disease, or unspecified chronic kidney disease: Secondary | ICD-10-CM | POA: Diagnosis not present

## 2014-02-11 DIAGNOSIS — N183 Chronic kidney disease, stage 3 (moderate): Secondary | ICD-10-CM | POA: Diagnosis not present

## 2014-02-11 DIAGNOSIS — S82891A Other fracture of right lower leg, initial encounter for closed fracture: Secondary | ICD-10-CM

## 2014-02-11 DIAGNOSIS — I10 Essential (primary) hypertension: Secondary | ICD-10-CM

## 2014-02-11 DIAGNOSIS — E119 Type 2 diabetes mellitus without complications: Secondary | ICD-10-CM | POA: Diagnosis not present

## 2014-02-11 DIAGNOSIS — E739 Lactose intolerance, unspecified: Secondary | ICD-10-CM | POA: Insufficient documentation

## 2014-02-11 DIAGNOSIS — Z87891 Personal history of nicotine dependence: Secondary | ICD-10-CM | POA: Insufficient documentation

## 2014-02-11 DIAGNOSIS — D649 Anemia, unspecified: Secondary | ICD-10-CM | POA: Diagnosis not present

## 2014-02-11 DIAGNOSIS — IMO0002 Reserved for concepts with insufficient information to code with codable children: Secondary | ICD-10-CM | POA: Diagnosis present

## 2014-02-11 DIAGNOSIS — E1165 Type 2 diabetes mellitus with hyperglycemia: Secondary | ICD-10-CM | POA: Diagnosis present

## 2014-02-11 DIAGNOSIS — S82841A Displaced bimalleolar fracture of right lower leg, initial encounter for closed fracture: Secondary | ICD-10-CM | POA: Diagnosis present

## 2014-02-11 DIAGNOSIS — F419 Anxiety disorder, unspecified: Secondary | ICD-10-CM | POA: Insufficient documentation

## 2014-02-11 DIAGNOSIS — K219 Gastro-esophageal reflux disease without esophagitis: Secondary | ICD-10-CM | POA: Insufficient documentation

## 2014-02-11 DIAGNOSIS — Y9289 Other specified places as the place of occurrence of the external cause: Secondary | ICD-10-CM | POA: Insufficient documentation

## 2014-02-11 DIAGNOSIS — M1990 Unspecified osteoarthritis, unspecified site: Secondary | ICD-10-CM | POA: Insufficient documentation

## 2014-02-11 HISTORY — DX: Anxiety disorder, unspecified: F41.9

## 2014-02-11 HISTORY — PX: ORIF ANKLE FRACTURE: SHX5408

## 2014-02-11 LAB — BASIC METABOLIC PANEL
Anion gap: 15 (ref 5–15)
Anion gap: 13 (ref 5–15)
BUN: 34 mg/dL — AB (ref 6–23)
BUN: 35 mg/dL — ABNORMAL HIGH (ref 6–23)
CALCIUM: 9.2 mg/dL (ref 8.4–10.5)
CHLORIDE: 98 meq/L (ref 96–112)
CO2: 24 meq/L (ref 19–32)
CO2: 24 meq/L (ref 19–32)
Calcium: 8.8 mg/dL (ref 8.4–10.5)
Chloride: 103 mEq/L (ref 96–112)
Creatinine, Ser: 1.22 mg/dL — ABNORMAL HIGH (ref 0.50–1.10)
Creatinine, Ser: 1.3 mg/dL — ABNORMAL HIGH (ref 0.50–1.10)
GFR calc Af Amer: 55 mL/min — ABNORMAL LOW (ref >90)
GFR calc Af Amer: 51 mL/min — ABNORMAL LOW (ref >90)
GFR calc non Af Amer: 44 mL/min — ABNORMAL LOW (ref >90)
GFR, EST NON AFRICAN AMERICAN: 47 mL/min — AB (ref >90)
GLUCOSE: 544 mg/dL — AB (ref 70–99)
Glucose, Bld: 170 mg/dL — ABNORMAL HIGH (ref 70–99)
POTASSIUM: 4.7 meq/L (ref 3.7–5.3)
Potassium: 3.7 mEq/L (ref 3.7–5.3)
SODIUM: 142 meq/L (ref 137–147)
Sodium: 135 mEq/L — ABNORMAL LOW (ref 137–147)

## 2014-02-11 LAB — CBC
HEMATOCRIT: 32.8 % — AB (ref 36.0–46.0)
HEMOGLOBIN: 10.3 g/dL — AB (ref 12.0–15.0)
MCH: 28.9 pg (ref 26.0–34.0)
MCHC: 31.4 g/dL (ref 30.0–36.0)
MCV: 91.9 fL (ref 78.0–100.0)
Platelets: 174 10*3/uL (ref 150–400)
RBC: 3.57 MIL/uL — AB (ref 3.87–5.11)
RDW: 13.6 % (ref 11.5–15.5)
WBC: 4.1 10*3/uL (ref 4.0–10.5)

## 2014-02-11 LAB — GLUCOSE, CAPILLARY
GLUCOSE-CAPILLARY: 158 mg/dL — AB (ref 70–99)
GLUCOSE-CAPILLARY: 212 mg/dL — AB (ref 70–99)
GLUCOSE-CAPILLARY: 493 mg/dL — AB (ref 70–99)
GLUCOSE-CAPILLARY: 493 mg/dL — AB (ref 70–99)
GLUCOSE-CAPILLARY: 513 mg/dL — AB (ref 70–99)
Glucose-Capillary: 161 mg/dL — ABNORMAL HIGH (ref 70–99)
Glucose-Capillary: 535 mg/dL — ABNORMAL HIGH (ref 70–99)

## 2014-02-11 SURGERY — OPEN REDUCTION INTERNAL FIXATION (ORIF) ANKLE FRACTURE
Anesthesia: Regional | Site: Ankle | Laterality: Right

## 2014-02-11 MED ORDER — INSULIN PUMP
Freq: Three times a day (TID) | SUBCUTANEOUS | Status: DC
  Administered 2014-02-11: 1 via SUBCUTANEOUS
  Filled 2014-02-11: qty 1

## 2014-02-11 MED ORDER — METHOCARBAMOL 1000 MG/10ML IJ SOLN
500.0000 mg | Freq: Four times a day (QID) | INTRAVENOUS | Status: DC | PRN
  Filled 2014-02-11: qty 5

## 2014-02-11 MED ORDER — FENTANYL CITRATE 0.05 MG/ML IJ SOLN
INTRAMUSCULAR | Status: DC | PRN
  Administered 2014-02-11: 50 ug via INTRAVENOUS
  Administered 2014-02-11 (×2): 100 ug via INTRAVENOUS

## 2014-02-11 MED ORDER — DEXAMETHASONE SODIUM PHOSPHATE 4 MG/ML IJ SOLN
INTRAMUSCULAR | Status: AC
  Filled 2014-02-11: qty 1

## 2014-02-11 MED ORDER — HYDROMORPHONE HCL 1 MG/ML IJ SOLN: 0.2500 mg | INTRAMUSCULAR | Status: DC | PRN

## 2014-02-11 MED ORDER — ONDANSETRON HCL 4 MG PO TABS: 4.0000 mg | ORAL_TABLET | Freq: Four times a day (QID) | ORAL | Status: DC | PRN

## 2014-02-11 MED ORDER — LACTATED RINGERS IV SOLN
INTRAVENOUS | Status: DC
  Administered 2014-02-11 (×2): via INTRAVENOUS

## 2014-02-11 MED ORDER — PROPOFOL 10 MG/ML IV BOLUS
INTRAVENOUS | Status: AC
  Filled 2014-02-11: qty 20

## 2014-02-11 MED ORDER — METHOCARBAMOL 500 MG PO TABS: 500.0000 mg | ORAL_TABLET | Freq: Two times a day (BID) | ORAL | Status: DC

## 2014-02-11 MED ORDER — LIDOCAINE HCL (CARDIAC) 20 MG/ML IV SOLN
INTRAVENOUS | Status: DC | PRN
  Administered 2014-02-11: 80 mg via INTRAVENOUS

## 2014-02-11 MED ORDER — DOCUSATE SODIUM 100 MG PO CAPS
100.0000 mg | ORAL_CAPSULE | Freq: Two times a day (BID) | ORAL | Status: DC
  Administered 2014-02-11 – 2014-02-14 (×6): 100 mg via ORAL
  Filled 2014-02-11 (×7): qty 1

## 2014-02-11 MED ORDER — PHENYLEPHRINE HCL 10 MG/ML IJ SOLN
INTRAMUSCULAR | Status: DC | PRN
  Administered 2014-02-11 (×3): 80 ug via INTRAVENOUS

## 2014-02-11 MED ORDER — FENTANYL CITRATE 0.05 MG/ML IJ SOLN
INTRAMUSCULAR | Status: AC
  Filled 2014-02-11: qty 5

## 2014-02-11 MED ORDER — SODIUM CHLORIDE 0.9 % IV SOLN: INTRAVENOUS | Status: DC

## 2014-02-11 MED ORDER — OXYCODONE HCL 5 MG/5ML PO SOLN: 5.0000 mg | Freq: Once | ORAL | Status: DC | PRN

## 2014-02-11 MED ORDER — BUPIVACAINE HCL (PF) 0.5 % IJ SOLN
INTRAMUSCULAR | Status: DC | PRN
  Administered 2014-02-11: 10 mL

## 2014-02-11 MED ORDER — PHENYLEPHRINE 40 MCG/ML (10ML) SYRINGE FOR IV PUSH (FOR BLOOD PRESSURE SUPPORT)
PREFILLED_SYRINGE | INTRAVENOUS | Status: AC
  Filled 2014-02-11: qty 10

## 2014-02-11 MED ORDER — DEXTROSE-NACL 5-0.45 % IV SOLN: INTRAVENOUS | Status: DC

## 2014-02-11 MED ORDER — METOCLOPRAMIDE HCL 5 MG/ML IJ SOLN: 5.0000 mg | Freq: Three times a day (TID) | INTRAMUSCULAR | Status: DC | PRN

## 2014-02-11 MED ORDER — PROPOFOL 10 MG/ML IV BOLUS
INTRAVENOUS | Status: DC | PRN
  Administered 2014-02-11: 100 mg via INTRAVENOUS
  Administered 2014-02-11: 200 mg via INTRAVENOUS

## 2014-02-11 MED ORDER — ONDANSETRON HCL 4 MG/2ML IJ SOLN: 4.0000 mg | Freq: Four times a day (QID) | INTRAMUSCULAR | Status: DC | PRN

## 2014-02-11 MED ORDER — PANTOPRAZOLE SODIUM 20 MG PO TBEC
20.0000 mg | DELAYED_RELEASE_TABLET | Freq: Every day | ORAL | Status: DC
  Administered 2014-02-12 – 2014-02-14 (×3): 20 mg via ORAL
  Filled 2014-02-11 (×4): qty 1

## 2014-02-11 MED ORDER — FENTANYL CITRATE 0.05 MG/ML IJ SOLN
100.0000 ug | Freq: Once | INTRAMUSCULAR | Status: AC
  Administered 2014-02-11: 100 ug via INTRAVENOUS

## 2014-02-11 MED ORDER — DEXAMETHASONE SODIUM PHOSPHATE 4 MG/ML IJ SOLN
INTRAMUSCULAR | Status: DC | PRN
  Administered 2014-02-11: 4 mg via INTRAVENOUS

## 2014-02-11 MED ORDER — INSULIN DETEMIR 100 UNIT/ML ~~LOC~~ SOLN
10.0000 [IU] | Freq: Two times a day (BID) | SUBCUTANEOUS | Status: DC
  Filled 2014-02-11 (×3): qty 0.1

## 2014-02-11 MED ORDER — BUPIVACAINE-EPINEPHRINE (PF) 0.5% -1:200000 IJ SOLN
INTRAMUSCULAR | Status: DC | PRN
  Administered 2014-02-11: 30 mL via PERINEURAL

## 2014-02-11 MED ORDER — HYDROCODONE-ACETAMINOPHEN 5-325 MG PO TABS
1.0000 | ORAL_TABLET | ORAL | Status: DC | PRN
  Administered 2014-02-13: 2 via ORAL
  Filled 2014-02-11 (×2): qty 2

## 2014-02-11 MED ORDER — LIDOCAINE HCL (CARDIAC) 20 MG/ML IV SOLN
INTRAVENOUS | Status: AC
  Filled 2014-02-11: qty 5

## 2014-02-11 MED ORDER — 0.9 % SODIUM CHLORIDE (POUR BTL) OPTIME
TOPICAL | Status: DC | PRN
  Administered 2014-02-11: 1000 mL

## 2014-02-11 MED ORDER — MIDAZOLAM HCL 2 MG/2ML IJ SOLN
INTRAMUSCULAR | Status: AC
  Administered 2014-02-11: 2 mg
  Filled 2014-02-11: qty 2

## 2014-02-11 MED ORDER — OXYCODONE HCL 5 MG PO TABS: 5.0000 mg | ORAL_TABLET | Freq: Once | ORAL | Status: DC | PRN

## 2014-02-11 MED ORDER — METOCLOPRAMIDE HCL 5 MG PO TABS
5.0000 mg | ORAL_TABLET | Freq: Three times a day (TID) | ORAL | Status: DC | PRN
  Filled 2014-02-11: qty 2

## 2014-02-11 MED ORDER — OXYCODONE-ACETAMINOPHEN 10-325 MG PO TABS: 0.5000 | ORAL_TABLET | Freq: Four times a day (QID) | ORAL | Status: DC | PRN

## 2014-02-11 MED ORDER — INSULIN ASPART 100 UNIT/ML ~~LOC~~ SOLN
0.0000 [IU] | Freq: Three times a day (TID) | SUBCUTANEOUS | Status: DC
  Administered 2014-02-11: 15 [IU] via SUBCUTANEOUS

## 2014-02-11 MED ORDER — FENTANYL CITRATE 0.05 MG/ML IJ SOLN
INTRAMUSCULAR | Status: AC
  Administered 2014-02-11: 100 ug via INTRAVENOUS
  Filled 2014-02-11: qty 2

## 2014-02-11 MED ORDER — ASPIRIN EC 325 MG PO TBEC: 325.0000 mg | DELAYED_RELEASE_TABLET | Freq: Two times a day (BID) | ORAL | Status: DC

## 2014-02-11 MED ORDER — METHOCARBAMOL 500 MG PO TABS
500.0000 mg | ORAL_TABLET | Freq: Four times a day (QID) | ORAL | Status: DC | PRN
  Administered 2014-02-11 – 2014-02-14 (×7): 500 mg via ORAL
  Filled 2014-02-11 (×7): qty 1

## 2014-02-11 MED ORDER — EPHEDRINE SULFATE 50 MG/ML IJ SOLN
INTRAMUSCULAR | Status: DC | PRN
  Administered 2014-02-11 (×2): 10 mg via INTRAVENOUS

## 2014-02-11 MED ORDER — MIDAZOLAM HCL 2 MG/2ML IJ SOLN
INTRAMUSCULAR | Status: AC
  Filled 2014-02-11: qty 2

## 2014-02-11 MED ORDER — SUCCINYLCHOLINE CHLORIDE 20 MG/ML IJ SOLN
INTRAMUSCULAR | Status: DC | PRN
  Administered 2014-02-11: 100 mg via INTRAVENOUS

## 2014-02-11 MED ORDER — MIDAZOLAM HCL 2 MG/2ML IJ SOLN
2.0000 mg | Freq: Once | INTRAMUSCULAR | Status: AC
  Administered 2014-02-11: 2 mg via INTRAVENOUS

## 2014-02-11 MED ORDER — ESCITALOPRAM OXALATE 10 MG PO TABS
10.0000 mg | ORAL_TABLET | Freq: Every day | ORAL | Status: DC
  Administered 2014-02-12 – 2014-02-14 (×3): 10 mg via ORAL
  Filled 2014-02-11 (×4): qty 1

## 2014-02-11 MED ORDER — OXYCODONE-ACETAMINOPHEN 5-325 MG PO TABS
1.0000 | ORAL_TABLET | ORAL | Status: DC | PRN
  Administered 2014-02-11 – 2014-02-14 (×13): 2 via ORAL
  Filled 2014-02-11 (×13): qty 2

## 2014-02-11 MED ORDER — INSULIN ASPART 100 UNIT/ML ~~LOC~~ SOLN: 0.0000 [IU] | Freq: Three times a day (TID) | SUBCUTANEOUS | Status: DC

## 2014-02-11 MED ORDER — CEFAZOLIN SODIUM-DEXTROSE 2-3 GM-% IV SOLR
INTRAVENOUS | Status: AC
Start: 2014-02-11 — End: 2014-02-11
  Administered 2014-02-11: 2 g via INTRAVENOUS
  Filled 2014-02-11: qty 50

## 2014-02-11 MED ORDER — CEFAZOLIN SODIUM-DEXTROSE 2-3 GM-% IV SOLR: 2.0000 g | INTRAVENOUS | Status: DC

## 2014-02-11 MED ORDER — KCL IN DEXTROSE-NACL 20-5-0.45 MEQ/L-%-% IV SOLN
INTRAVENOUS | Status: DC
  Filled 2014-02-11 (×2): qty 1000

## 2014-02-11 SURGICAL SUPPLY — 57 items
BANDAGE ELASTIC 4 VELCRO ST LF (GAUZE/BANDAGES/DRESSINGS) ×2 IMPLANT
BANDAGE ELASTIC 6 VELCRO ST LF (GAUZE/BANDAGES/DRESSINGS) ×2 IMPLANT
BANDAGE ESMARK 6X9 LF (GAUZE/BANDAGES/DRESSINGS) ×1 IMPLANT
BIT DRILL 2.5X2.75 QC CALB (BIT) ×4 IMPLANT
BIT DRILL 2.9X70 QC CALB (BIT) ×2 IMPLANT
BIT DRILL 3.5X5.5 QC CALB (BIT) ×2 IMPLANT
BNDG ESMARK 6X9 LF (GAUZE/BANDAGES/DRESSINGS) ×2
COVER SURGICAL LIGHT HANDLE (MISCELLANEOUS) ×2 IMPLANT
CUFF TOURNIQUET SINGLE 24IN (TOURNIQUET CUFF) ×2 IMPLANT
DECANTER SPIKE VIAL GLASS SM (MISCELLANEOUS) IMPLANT
DRAPE OEC MINIVIEW 54X84 (DRAPES) IMPLANT
DRAPE U-SHAPE 47X51 STRL (DRAPES) ×2 IMPLANT
DRSG PAD ABDOMINAL 8X10 ST (GAUZE/BANDAGES/DRESSINGS) ×2 IMPLANT
DURAPREP 26ML APPLICATOR (WOUND CARE) ×2 IMPLANT
ELECT REM PT RETURN 9FT ADLT (ELECTROSURGICAL) ×2
ELECTRODE REM PT RTRN 9FT ADLT (ELECTROSURGICAL) ×1 IMPLANT
GAUZE SPONGE 4X4 12PLY STRL (GAUZE/BANDAGES/DRESSINGS) ×2 IMPLANT
GAUZE XEROFORM 1X8 LF (GAUZE/BANDAGES/DRESSINGS) ×6 IMPLANT
GLOVE BIO SURGEON STRL SZ7.5 (GLOVE) ×2 IMPLANT
GLOVE BIO SURGEON STRL SZ8.5 (GLOVE) ×2 IMPLANT
GLOVE BIOGEL PI IND STRL 8 (GLOVE) ×1 IMPLANT
GLOVE BIOGEL PI IND STRL 9 (GLOVE) ×1 IMPLANT
GLOVE BIOGEL PI INDICATOR 8 (GLOVE) ×1
GLOVE BIOGEL PI INDICATOR 9 (GLOVE) ×1
GOWN STRL REUS W/ TWL LRG LVL3 (GOWN DISPOSABLE) ×3 IMPLANT
GOWN STRL REUS W/ TWL XL LVL3 (GOWN DISPOSABLE) ×1 IMPLANT
GOWN STRL REUS W/TWL LRG LVL3 (GOWN DISPOSABLE) ×3
GOWN STRL REUS W/TWL XL LVL3 (GOWN DISPOSABLE) ×1
KIT BASIN OR (CUSTOM PROCEDURE TRAY) ×2 IMPLANT
KIT ROOM TURNOVER OR (KITS) ×2 IMPLANT
MANIFOLD NEPTUNE II (INSTRUMENTS) ×2 IMPLANT
NS IRRIG 1000ML POUR BTL (IV SOLUTION) ×2 IMPLANT
PACK ORTHO EXTREMITY (CUSTOM PROCEDURE TRAY) ×2 IMPLANT
PAD ARMBOARD 7.5X6 YLW CONV (MISCELLANEOUS) ×4 IMPLANT
PAD CAST 4YDX4 CTTN HI CHSV (CAST SUPPLIES) ×1 IMPLANT
PADDING CAST COTTON 4X4 STRL (CAST SUPPLIES) ×1
PADDING CAST COTTON 6X4 STRL (CAST SUPPLIES) ×2 IMPLANT
PLATE ACE 100DEG 7HOLE (Plate) ×2 IMPLANT
SCREW CANC LAG 4X40 (Screw) ×2 IMPLANT
SCREW CORTICAL 3.5MM  16MM (Screw) ×2 IMPLANT
SCREW CORTICAL 3.5MM 14MM (Screw) ×4 IMPLANT
SCREW CORTICAL 3.5MM 16MM (Screw) ×2 IMPLANT
SCREW CORTICAL 3.5MM 18MM (Screw) ×2 IMPLANT
SCREW CORTICAL 3.5MM 70MM (Screw) ×2 IMPLANT
SCREW NLOCK CANC HEX 4X12 (Screw) ×4 IMPLANT
SPONGE LAP 4X18 X RAY DECT (DISPOSABLE) ×4 IMPLANT
STAPLER VISISTAT 35W (STAPLE) ×2 IMPLANT
SUCTION FRAZIER TIP 10 FR DISP (SUCTIONS) ×2 IMPLANT
SUT VIC AB 2-0 CTB1 (SUTURE) ×2 IMPLANT
SUT VIC AB 3-0 FS2 27 (SUTURE) ×4 IMPLANT
SUT VICRYL 3 0 (SUTURE) ×4 IMPLANT
TAP CORT SCOUPLE (TRAUMA) ×2 IMPLANT
TOWEL OR 17X24 6PK STRL BLUE (TOWEL DISPOSABLE) ×2 IMPLANT
TOWEL OR 17X26 10 PK STRL BLUE (TOWEL DISPOSABLE) ×2 IMPLANT
TUBE CONNECTING 12X1/4 (SUCTIONS) ×2 IMPLANT
WATER STERILE IRR 1000ML POUR (IV SOLUTION) ×2 IMPLANT
YANKAUER SUCT BULB TIP NO VENT (SUCTIONS) ×2 IMPLANT

## 2014-02-11 NOTE — Discharge Instructions (Signed)
Ankle Fracture  A fracture is a break in a bone. The ankle joint is made up of three bones. These include the lower (distal)sections of your lower leg bones, called the tibia and fibula, along with a bone in your foot, called the talus. Depending on how bad the break is and if more than one ankle joint bone is broken, a cast or splint is used to protect and keep your injured bone from moving while it heals. Sometimes, surgery is required to help the fracture heal properly.   There are two general types of fractures:   Stable fracture. This includes a single fracture line through one bone, with no injury to ankle ligaments. A fracture of the talus that does not have any displacement (movement of the bone on either side of the fracture line) is also stable.   Unstable fracture. This includes more than one fracture line through one or more bones in the ankle joint. It also includes fractures that have displacement of the bone on either side of the fracture line.  CAUSES   A direct blow to the ankle.    Quickly and severely twisting your ankle.   Trauma, such as a car accident or falling from a significant height.  RISK FACTORS  You may be at a higher risk of ankle fracture if:   You have certain medical conditions.   You are involved in high-impact sports.   You are involved in a high-impact car accident.  SIGNS AND SYMPTOMS    Tender and swollen ankle.   Bruising around the injured ankle.   Pain on movement of the ankle.   Difficulty walking or putting weight on the ankle.   A cold foot below the site of the ankle injury. This can occur if the blood vessels passing through your injured ankle were also damaged.   Numbness in the foot below the site of the ankle injury.  DIAGNOSIS   An ankle fracture is usually diagnosed with a physical exam and X-rays. A CT scan may also be required for complex fractures.  TREATMENT   Stable fractures are treated with a cast or splint and using crutches to avoid putting  weight on your injured ankle. This is followed by an ankle strengthening program. Some patients require a special type of cast, depending on other medical problems they may have. Unstable fractures require surgery to ensure the bones heal properly. Your health care provider will tell you what type of fracture you have and the best treatment for your condition.  HOME CARE INSTRUCTIONS    Review correct crutch use with your health care provider and use your crutches as directed. Safe use of crutches is extremely important. Misuse of crutches can cause you to fall or cause injury to nerves in your hands or armpits.   Do not put weight or pressure on the injured ankle until directed by your health care provider.   To lessen the swelling, keep the injured leg elevated while sitting or lying down.   Apply ice to the injured area:   Put ice in a plastic bag.   Place a towel between your cast and the bag.   Leave the ice on for 20 minutes, 2-3 times a day.   If you have a plaster or fiberglass cast:   Do not try to scratch the skin under the cast with any objects. This can increase your risk of skin infection.   Check the skin around the cast every day. You   may put lotion on any red or sore areas.   Keep your cast dry and clean.   If you have a plaster splint:   Wear the splint as directed.   You may loosen the elastic around the splint if your toes become numb, tingle, or turn cold or blue.   Do not put pressure on any part of your cast or splint; it may break. Rest your cast only on a pillow the first 24 hours until it is fully hardened.   Your cast or splint can be protected during bathing with a plastic bag sealed to your skin with medical tape. Do not lower the cast or splint into water.   Take medicines as directed by your health care provider. Only take over-the-counter or prescription medicines for pain, discomfort, or fever as directed by your health care provider.   Do not drive a vehicle until  your health care provider specifically tells you it is safe to do so.   If your health care provider has given you a follow-up appointment, it is very important to keep that appointment. Not keeping the appointment could result in a chronic or permanent injury, pain, and disability. If you have any problem keeping the appointment, call the facility for assistance.  SEEK MEDICAL CARE IF:  You develop increased swelling or discomfort.  SEEK IMMEDIATE MEDICAL CARE IF:    Your cast gets damaged or breaks.   You have continued severe pain.   You develop new pain or swelling after the cast was put on.   Your skin or toenails below the injury turn blue or gray.   Your skin or toenails below the injury feel cold, numb, or have loss of sensitivity to touch.   There is a bad smell or pus draining from under the cast.  MAKE SURE YOU:    Understand these instructions.   Will watch your condition.   Will get help right away if you are not doing well or get worse.  Document Released: 03/17/2000 Document Revised: 03/25/2013 Document Reviewed: 10/17/2012  ExitCare Patient Information 2015 [employer]. This information is not intended to replace advice given to you by your health care provider. Make sure you discuss any questions you have with your health care provider.

## 2014-02-11 NOTE — Transfer of Care (Signed)
Immediate Anesthesia Transfer of Care Note  Patient: Karen Gardner  Procedure(s) Performed: Procedure(s): OPEN REDUCTION INTERNAL FIXATION (ORIF) RIGHT ANKLE FRACTURE (Right)  Patient Location: PACU  Anesthesia Type:GA combined with regional for post-op pain  Level of Consciousness: awake, alert , oriented and patient cooperative  Airway & Oxygen Therapy: Patient Spontanous Breathing and Patient connected to face mask oxygen  Post-op Assessment: Report given to PACU RN and Post -op Vital signs reviewed and stable  Post vital signs: Reviewed and stable  Complications: No apparent anesthesia complications

## 2014-02-11 NOTE — Anesthesia Preprocedure Evaluation (Addendum)
Anesthesia Evaluation  Patient identified by MRN, date of birth, ID band Patient awake    Reviewed: Allergy & Precautions, H&P , NPO status , Patient's Chart, lab work & pertinent test results  Airway Mallampati: III  TM Distance: >3 FB Neck ROM: Full    Dental no notable dental hx. (+) Teeth Intact, Dental Advisory Given   Pulmonary neg pulmonary ROS, former smoker,  breath sounds clear to auscultation  Pulmonary exam normal       Cardiovascular hypertension, Rhythm:Regular Rate:Normal     Neuro/Psych Anxiety negative neurological ROS  negative psych ROS   GI/Hepatic Neg liver ROS, GERD-  Medicated and Controlled,  Endo/Other  diabetes, Type 1, Insulin DependentMorbid obesity  Renal/GU negative Renal ROS  negative genitourinary   Musculoskeletal  (+) Arthritis -, Osteoarthritis,    Abdominal   Peds  Hematology negative hematology ROS (+) anemia ,   Anesthesia Other Findings   Reproductive/Obstetrics negative OB ROS                           Anesthesia Physical Anesthesia Plan  ASA: III  Anesthesia Plan: General and Regional   Post-op Pain Management:    Induction: Intravenous  Airway Management Planned: Oral ETT  Additional Equipment:   Intra-op Plan:   Post-operative Plan: Extubation in OR  Informed Consent: I have reviewed the patients History and Physical, chart, labs and discussed the procedure including the risks, benefits and alternatives for the proposed anesthesia with the patient or authorized representative who has indicated his/her understanding and acceptance.   Dental advisory given  Plan Discussed with: CRNA  Anesthesia Plan Comments:         Anesthesia Quick Evaluation

## 2014-02-11 NOTE — Anesthesia Postprocedure Evaluation (Signed)
  Anesthesia Post-op Note  Patient: Karen Gardner  Procedure(s) Performed: Procedure(s): OPEN REDUCTION INTERNAL FIXATION (ORIF) RIGHT ANKLE FRACTURE (Right)  Patient Location: PACU  Anesthesia Type:GA combined with regional for post-op pain  Level of Consciousness: awake, alert , oriented and patient cooperative  Airway and Oxygen Therapy: Patient Spontanous Breathing  Post-op Pain: none  Post-op Assessment: Post-op Vital signs reviewed, Patient's Cardiovascular Status Stable, Respiratory Function Stable, Patent Airway, No signs of Nausea or vomiting and Pain level controlled  Post-op Vital Signs: Reviewed and stable  Last Vitals:  Filed Vitals:   02/11/14 1758  BP:   Pulse:   Temp: 36.6 C  Resp:     Complications: No apparent anesthesia complications

## 2014-02-11 NOTE — Op Note (Signed)
Preoperative diagnosis: Rightankle bimalleolar fracturedisplaced  Postoperative diagnosis: Same  Procedure: Open reduction internal fixation of right ankle with 45 by 4 mm DePuy cancellus screw medially, 7 hole one third tubular plate laterally with one interfrag screw Surgeon: Feliberto GottronFrank J. Turner Danielsowan M.D.  First assistant: Tomi LikensEric K. Gaylene BrooksPhillips PA-C  (present throughout entire procedure and necessary for timely completion of the procedure) Anesthetic: Gen. LMA  Estimated blood loss: Minimal  Tourniquet time: 52 minutes  Indications for procedure:patient tripped and fell while [place] last [place] injuring her right ankle, was seen at the emergency room and diagnosed with a bimalleolar fracture displaced. At that time there was minimal widening of the mortise joint she was placed in a posterior splint boot given crutches and told to followup with orthopedics. When she presented to our office the x-rays confirmed of the bimalleolar fracture. The skin was intact and there are no fracture blisters. Because of the unstable fracture pattern surgery was discussed. Risks and benefits of surgery were discussed.   Description of procedure: The patient was identified by arm band, and received preoperative IV antibiotics in the holding area at, day [facility] Center. She then received a right popliteal block, was taken to the operating room where the appropriate anesthetic monitors were attached and general LMA anesthesia was induced. A tourniquet was applied high to the right calf and the right lower from a prepped and draped in the usual sterile fashion from the toes to the tourniquet. A time out procedure was performed. The limb was then wrapped with an Esmarch bandage and the tourniquet inflated to 300 mm of mercury. We began the operation by making a longitudinal incision starting at the tip of the medial malleolus and going proximally for about 5 cm. Small bleeders were identified and cauterized and we  found the fracture site with minimal comminution. The periosteum was taken down to better identify the fracture site in the medial malleolus was then retracted using a bone hook as well as anterior and posterior baby Cobra retractors. We examined the talus and found to be in good condition. We were then able to obtain an anatomic reduction with a tenaculum calibrated appear fixation was then accomplished with an anterior 45 mm x 4 mm Synthes partially threaded lag screw. Firm fixation was obtained. We then direct her attention to the lateral side of the ankle and made a longitudinal incision starting at the tip of the lateral malleolus and going proximally for 9 cm. Using tenotomy scissors we dissected down to the fibula and the fracture site and reflected the periosteum anteriorly and posteriorly. Clot was removed from the fracture site. Using a lion jaw clamp the fracture was then reduced and a single anterior to posterior 3.5 mm cortical interfrag screw was placed to provisionally hold the reduction. We then contoured and applied a 7 hole one third tubular plate from the DePuy small fragment set completing the fixation of the fibula. We used 4 bicortical screws proximally and 3 unicortical cancellus screws distally.Final C-arm images were obtained and saved. The tourniquet was let down small bleeders identified and cauterized, we then closed in layers with 3-0 Vicryl suture subcutaneously and subcuticular. A dressing of 0 from 4 x 4 dressing sponges web roll Ace wrap and a Cam Walker boot was then applied. The patient was then awakened extubated and taken to the recovery without difficulty. She'll be 20-30 pounds weight bearing.

## 2014-02-11 NOTE — Plan of Care (Signed)
Problem: Phase I Progression Outcomes Goal: OOB as tolerated unless otherwise ordered Outcome: Completed/Met Date Met:  02/11/14 Goal: Incision/dressings dry and intact Outcome: Completed/Met Date Met:  02/11/14 Goal: Sutures/staples intact Outcome: Completed/Met Date Met:  02/11/14 Goal: Tubes/drains patent Outcome: Not Applicable Date Met:  50/01/64 Goal: Initial discharge plan identified Outcome: Completed/Met Date Met:  02/11/14 Goal: Voiding-avoid urinary catheter unless indicated Outcome: Completed/Met Date Met:  02/11/14 Goal: Vital signs/hemodynamically stable Outcome: Completed/Met Date Met:  02/11/14

## 2014-02-11 NOTE — Interval H&P Note (Signed)
History and Physical Interval Note:  02/11/2014 11:54 AM  Karen LoboMarian D Giebler  has presented today for surgery, with the diagnosis of bimalleolar fracture of right ankle  The various methods of treatment have been discussed with the patient and family. After consideration of risks, benefits and other options for treatment, the patient has consented to  Procedure(s): OPEN REDUCTION INTERNAL FIXATION (ORIF) RIGHT ANKLE FRACTURE (Right) as a surgical intervention .  The patient's history has been reviewed, patient examined, no change in status, stable for surgery.  I have reviewed the patient's chart and labs.  Questions were answered to the patient's satisfaction.     Nestor LewandowskyOWAN,Ezzard Ditmer J

## 2014-02-11 NOTE — Anesthesia Procedure Notes (Addendum)
Anesthesia Regional Block:  Popliteal block  Pre-Anesthetic Checklist: ,, timeout performed, Correct Patient, Correct Site, Correct Laterality, Correct Procedure, Correct Position, site marked, Risks and benefits discussed, pre-op evaluation, post-op pain management  Laterality: Right  Prep: Maximum Sterile Barrier Precautions used and chloraprep       Needles:  Injection technique: Single-shot  Needle Type: Echogenic Stimulator Needle     Needle Length: 9cm 9 cm Needle Gauge: 21 and 21 G    Additional Needles:  Procedures: ultrasound guided (picture in chart) and nerve stimulator Popliteal block  Nerve Stimulator or Paresthesia:  Response: Peroneal,  Response: Tibial,   Additional Responses:   Narrative:  Start time: 02/11/2014 1:25 PM End time: 02/11/2014 1:38 PM Injection made incrementally with aspirations every 5 mL.  Additional Notes: 2% Lidocaine skin wheel. Saphenous block with 10cc of 0.5% Bupivicaine plain.

## 2014-02-12 ENCOUNTER — Encounter (HOSPITAL_COMMUNITY): Payer: Self-pay | Admitting: Orthopedic Surgery

## 2014-02-12 DIAGNOSIS — S82841D Displaced bimalleolar fracture of right lower leg, subsequent encounter for closed fracture with routine healing: Secondary | ICD-10-CM

## 2014-02-12 DIAGNOSIS — IMO0002 Reserved for concepts with insufficient information to code with codable children: Secondary | ICD-10-CM | POA: Diagnosis present

## 2014-02-12 DIAGNOSIS — E1165 Type 2 diabetes mellitus with hyperglycemia: Secondary | ICD-10-CM

## 2014-02-12 DIAGNOSIS — S82841A Displaced bimalleolar fracture of right lower leg, initial encounter for closed fracture: Secondary | ICD-10-CM | POA: Diagnosis not present

## 2014-02-12 DIAGNOSIS — I10 Essential (primary) hypertension: Secondary | ICD-10-CM

## 2014-02-12 LAB — COMPREHENSIVE METABOLIC PANEL
ALT: 14 U/L (ref 0–35)
AST: 18 U/L (ref 0–37)
Albumin: 2.8 g/dL — ABNORMAL LOW (ref 3.5–5.2)
Alkaline Phosphatase: 74 U/L (ref 39–117)
Anion gap: 12 (ref 5–15)
BUN: 33 mg/dL — ABNORMAL HIGH (ref 6–23)
CALCIUM: 8.9 mg/dL (ref 8.4–10.5)
CO2: 26 mEq/L (ref 19–32)
Chloride: 103 mEq/L (ref 96–112)
Creatinine, Ser: 1.28 mg/dL — ABNORMAL HIGH (ref 0.50–1.10)
GFR calc non Af Amer: 45 mL/min — ABNORMAL LOW (ref >90)
GFR, EST AFRICAN AMERICAN: 52 mL/min — AB (ref >90)
Glucose, Bld: 262 mg/dL — ABNORMAL HIGH (ref 70–99)
Potassium: 4.2 mEq/L (ref 3.7–5.3)
Sodium: 141 mEq/L (ref 137–147)
TOTAL PROTEIN: 6.6 g/dL (ref 6.0–8.3)
Total Bilirubin: 0.3 mg/dL (ref 0.3–1.2)

## 2014-02-12 LAB — CBC WITH DIFFERENTIAL/PLATELET
Basophils Absolute: 0 10*3/uL (ref 0.0–0.1)
Basophils Relative: 0 % (ref 0–1)
EOS ABS: 0 10*3/uL (ref 0.0–0.7)
Eosinophils Relative: 0 % (ref 0–5)
HCT: 27.3 % — ABNORMAL LOW (ref 36.0–46.0)
HEMOGLOBIN: 8.6 g/dL — AB (ref 12.0–15.0)
LYMPHS ABS: 0.9 10*3/uL (ref 0.7–4.0)
LYMPHS PCT: 17 % (ref 12–46)
MCH: 29 pg (ref 26.0–34.0)
MCHC: 31.5 g/dL (ref 30.0–36.0)
MCV: 91.9 fL (ref 78.0–100.0)
MONOS PCT: 14 % — AB (ref 3–12)
Monocytes Absolute: 0.8 10*3/uL (ref 0.1–1.0)
NEUTROS PCT: 69 % (ref 43–77)
Neutro Abs: 3.9 10*3/uL (ref 1.7–7.7)
Platelets: 176 10*3/uL (ref 150–400)
RBC: 2.97 MIL/uL — AB (ref 3.87–5.11)
RDW: 13.5 % (ref 11.5–15.5)
WBC: 5.7 10*3/uL (ref 4.0–10.5)

## 2014-02-12 LAB — URINE MICROSCOPIC-ADD ON

## 2014-02-12 LAB — URINALYSIS, ROUTINE W REFLEX MICROSCOPIC
Bilirubin Urine: NEGATIVE
GLUCOSE, UA: 250 mg/dL — AB
Hgb urine dipstick: NEGATIVE
KETONES UR: NEGATIVE mg/dL
LEUKOCYTES UA: NEGATIVE
Nitrite: NEGATIVE
PH: 5 (ref 5.0–8.0)
Protein, ur: 30 mg/dL — AB
Specific Gravity, Urine: 1.03 (ref 1.005–1.030)
Urobilinogen, UA: 0.2 mg/dL (ref 0.0–1.0)

## 2014-02-12 LAB — HEMOGLOBIN A1C
Hgb A1c MFr Bld: 7.4 % — ABNORMAL HIGH (ref <5.7)
Mean Plasma Glucose: 166 mg/dL — ABNORMAL HIGH (ref <117)

## 2014-02-12 LAB — GLUCOSE, CAPILLARY
GLUCOSE-CAPILLARY: 270 mg/dL — AB (ref 70–99)
Glucose-Capillary: 176 mg/dL — ABNORMAL HIGH (ref 70–99)
Glucose-Capillary: 267 mg/dL — ABNORMAL HIGH (ref 70–99)
Glucose-Capillary: 292 mg/dL — ABNORMAL HIGH (ref 70–99)
Glucose-Capillary: 426 mg/dL — ABNORMAL HIGH (ref 70–99)

## 2014-02-12 MED ORDER — INSULIN PUMP
SUBCUTANEOUS | Status: DC
Start: 2014-02-12 — End: 2014-02-14
  Administered 2014-02-12: 1 via SUBCUTANEOUS
  Administered 2014-02-12: 22:00:00 via SUBCUTANEOUS
  Administered 2014-02-12: 1 via SUBCUTANEOUS
  Administered 2014-02-12 (×2): 4 via SUBCUTANEOUS
  Administered 2014-02-13: 2 via SUBCUTANEOUS
  Administered 2014-02-13 (×2): 1 via SUBCUTANEOUS
  Filled 2014-02-12: qty 1

## 2014-02-12 MED ORDER — INSULIN ASPART 100 UNIT/ML ~~LOC~~ SOLN
5.0000 [IU] | Freq: Once | SUBCUTANEOUS | Status: AC
  Administered 2014-02-12: 5 [IU] via SUBCUTANEOUS

## 2014-02-12 MED ORDER — HYDROMORPHONE HCL 1 MG/ML IJ SOLN
1.0000 mg | INTRAMUSCULAR | Status: DC | PRN
  Administered 2014-02-12 – 2014-02-13 (×5): 1 mg via INTRAVENOUS
  Filled 2014-02-12 (×4): qty 1

## 2014-02-12 MED ORDER — HYDRALAZINE HCL 20 MG/ML IJ SOLN: 10.0000 mg | INTRAMUSCULAR | Status: DC | PRN

## 2014-02-12 MED ORDER — INSULIN ASPART 100 UNIT/ML ~~LOC~~ SOLN
0.0000 [IU] | Freq: Three times a day (TID) | SUBCUTANEOUS | Status: DC
Start: 2014-02-12 — End: 2014-02-13
  Administered 2014-02-12: 5 [IU] via SUBCUTANEOUS
  Administered 2014-02-12: 2 [IU] via SUBCUTANEOUS
  Administered 2014-02-13 (×2): 1 [IU] via SUBCUTANEOUS

## 2014-02-12 MED ORDER — INSULIN ASPART 100 UNIT/ML ~~LOC~~ SOLN: 0.0000 [IU] | Freq: Every day | SUBCUTANEOUS | Status: DC

## 2014-02-12 MED ORDER — HYDROMORPHONE HCL 1 MG/ML IJ SOLN
INTRAMUSCULAR | Status: AC
  Filled 2014-02-12: qty 1

## 2014-02-12 NOTE — Plan of Care (Signed)
Problem: Phase II Progression Outcomes Goal: Sutures/staples intact Outcome: Completed/Met Date Met:  02/12/14

## 2014-02-12 NOTE — Care Management Note (Signed)
CARE MANAGEMENT NOTE 02/12/2014  Patient:  Karen Gardner,Karen Gardner   Account Number:  [id number]  Date Initiated:  02/12/2014  Documentation initiated by:  Vance PeperBRADY,Alexios Keown  Subjective/Objective Assessment:   58 yr old female admitted s/p fall with bimalleolar fracture of right ankle. Patient had ORIF of right ankle.     Action/Plan:   Case manager spoke with patient concerning home health and DME needs. Choice offered. Referral given to [facility]bMir[facility] C[facility] liaison.Patient has RW ,3in1.  Patient arranging 24/7 assistance.   Anticipated DC Date:  02/13/2014   Anticipated DC Plan:  HOME W HOME HEALTH SERVICES      DC Planning Services  CM consult      [facility]   Choice offered to / List presented to:  C-1 Patient   DME arranged  3-N-1      DME agency  [facility]     HH arranged  HH-2 PT  HH-4 NURSE'S AIDE      HH agency  [facility]   Status of service:  Completed, signed off Medicare Important Message given?   (If response is "NO", the following Medicare IM given date fields will be blank) Date Medicare IM given:   Medicare IM given by:   Date Additional Medicare IM given:   Additional Medicare IM given by:    Discharge Disposition:  HOME W HOME HEALTH SERVICES  Per UR Regulation:  Reviewed for med. necessity/level of care/duration of stay  If discussed at Long Length of Stay Meetings, dates discussed:    Comments:

## 2014-02-12 NOTE — Progress Notes (Signed)
Consult note                                            Patient Demographics  Karen Gardner, is a 58 y.o. female, DOB - 08/20/1955, ZOX:[id number]  Admit date - 02/11/2014   Admitting Physician Nestor Lewandowsky, MD  Outpatient Primary MD for the patient is Varadarajan, Rupashree  LOS - 1   No chief complaint on file.       Subjective:   Karen Gardner today has, No headache, No chest pain, No abdominal pain - No Nausea, No new weakness tingling or numbness, No Cough - SOB.    Assessment & Plan    1. Mechanical injury with right ankle fracture, bimalleolar, closed - post ORIF, will defer management to primary team which is orthopedics.   2. GERD. On PPI continue.   3. CK D stage III. Baseline creatinine between 1.2 and 1.3. At baseline.   4. Diabetes mellitus type 2, uncontrolled - wears insulin pump, takes NovoLog with meals, sugars were in good control before she came to the hospital, question if pump has malfunctioned, we will get pump interrogated, monitor CBGs, currently on additional Levemir twice a day Will add sliding scale NovoLog here for better control.  Lab Results  Component Value Date   HGBA1C 7.4* 02/11/2014    CBG (last 3)   Recent Labs  02/11/14 2320 02/12/14 0003 02/12/14 0428  GLUCAP 513* 426* 267*          Medications  Scheduled Meds: . docusate sodium  100 mg Oral BID  . escitalopram  10 mg Oral Daily  . HYDROmorphone      . insulin detemir  10 Units Subcutaneous BID  . insulin pump   Subcutaneous 6 times per day  . pantoprazole  20 mg Oral Daily   Continuous Infusions:  PRN Meds:.hydrALAZINE, HYDROcodone-acetaminophen, HYDROmorphone (DILAUDID) injection, methocarbamol **OR** [DISCONTINUED] methocarbamol (ROBAXIN)  IV, [DISCONTINUED]  ondansetron **OR** ondansetron (ZOFRAN) IV, oxyCODONE-acetaminophen  DVT Prophylaxis  Per primary team  Lab Results  Component Value Date   PLT 176 02/12/2014    Antibiotics     Anti-infectives    Start     Dose/Rate Route Frequency Ordered Stop   02/11/14 1204  ceFAZolin (ANCEF) 2-3 GM-% IVPB SOLR    CommentsMacon Large   : cabinet override      02/11/14 1204 02/11/14 1515   02/11/14 1200  ceFAZolin (ANCEF) IVPB 2 g/50 mL premix  Status:  Discontinued     2 g100 mL/hr over 30 Minutes Intravenous On call to O.R. 02/11/14 1156 02/11/14 1758          Objective:   Filed Vitals:   02/11/14 1809 02/11/14 2047 02/12/14 0138 02/12/14 0609  BP: 153/68 116/45 121/61 123/66  Pulse: 102 111 103 98  Temp: 97.9 F (36.6 C) 98.3 F (36.8 C) 99 F (37.2 C) 98.9 F (37.2 C)  TempSrc:      Resp: 19 18 18 18   Height:      Weight:  SpO2: 91% 97% 93% 96%    Wt Readings from Last 3 Encounters:  02/11/14 104.129 kg (229 lb 9 oz)  01/28/14 104.146 kg (229 lb 9.6 oz)  01/21/14 104.599 kg (230 lb 9.6 oz)     Intake/Output Summary (Last 24 hours) at 02/12/14 1046 Last data filed at 02/12/14 0900  Gross per 24 hour  Intake   1680 ml  Output     60 ml  Net   1620 ml     Physical Exam  Awake Alert, Oriented X 3, No new F.N deficits, Normal affect [place].AT,PERRAL Supple Neck,No JVD, No cervical lymphadenopathy appriciated.  Symmetrical Chest wall movement, Good air movement bilaterally, CTAB RRR,No Gallops,Rubs or new Murmurs, No Parasternal Heave +ve B.Sounds, Abd Soft, No tenderness, No organomegaly appriciated, No rebound - guarding or rigidity. No Cyanosis, Clubbing or edema, No new Rash or bruise , R ankle in Boot and under bandage   Data Review   Micro Results No results found for this or any previous visit (from the past 240 hour(s)).  Radiology Reports Dg Ankle 2 Views Right  02/11/2014   CLINICAL DATA:  ORIF, right ankle fracture  EXAM: DG C-ARM 61-120  MIN; RIGHT ANKLE - 2 VIEW  TECHNIQUE: Two intraoperative views of the right ankle  CONTRAST:  None  FLUOROSCOPY TIME:  5 seconds  COMPARISON:  None  FINDINGS: Two views of the right ankle submitted. A metallic fixation plate and screws noted in distal right fibula. A metallic fixation screw is noted in distal tibia medial malleolus. Ankle mortise is preserved. There is anatomic alignment.  IMPRESSION: Metallic fixation plate and screws in distal fibula. Metallic fixation screw in distal right tibia medial malleolus. There is anatomic alignment. Ankle mortise is preserved.   Electronically Signed   By: Natasha MeadLiviu  Pop M.D.   On: 02/11/2014 16:44   Dg C-arm 1-60 Min  02/11/2014   CLINICAL DATA:  ORIF, right ankle fracture  EXAM: DG C-ARM 61-120 MIN; RIGHT ANKLE - 2 VIEW  TECHNIQUE: Two intraoperative views of the right ankle  CONTRAST:  None  FLUOROSCOPY TIME:  5 seconds  COMPARISON:  None  FINDINGS: Two views of the right ankle submitted. A metallic fixation plate and screws noted in distal right fibula. A metallic fixation screw is noted in distal tibia medial malleolus. Ankle mortise is preserved. There is anatomic alignment.  IMPRESSION: Metallic fixation plate and screws in distal fibula. Metallic fixation screw in distal right tibia medial malleolus. There is anatomic alignment. Ankle mortise is preserved.   Electronically Signed   By: Natasha MeadLiviu  Pop M.D.   On: 02/11/2014 16:44     CBC  Recent Labs Lab 02/11/14 1158 02/12/14 0510  WBC 4.1 5.7  HGB 10.3* 8.6*  HCT 32.8* 27.3*  PLT 174 176  MCV 91.9 91.9  MCH 28.9 29.0  MCHC 31.4 31.5  RDW 13.6 13.5  LYMPHSABS  --  0.9  MONOABS  --  0.8  EOSABS  --  0.0  BASOSABS  --  0.0    Chemistries   Recent Labs Lab 02/11/14 1158 02/11/14 2310 02/12/14 0510  NA 142 135* 141  K 3.7 4.7 4.2  CL 103 98 103  CO2 24 24 26   GLUCOSE 170* 544* 262*  BUN 34* 35* 33*  CREATININE 1.22* 1.30* 1.28*  CALCIUM 9.2 8.8 8.9  AST  --   --  18  ALT  --   --  14   ALKPHOS  --   --  74  BILITOT  --   --  0.3   ------------------------------------------------------------------------------------------------------------------ estimated creatinine clearance is 56.2 mL/min (by C-G formula based on Cr of 1.28). ------------------------------------------------------------------------------------------------------------------  Recent Labs  02/11/14 1853  HGBA1C 7.4*   ------------------------------------------------------------------------------------------------------------------ No results for input(s): CHOL, HDL, LDLCALC, TRIG, CHOLHDL, LDLDIRECT in the last 72 hours. ------------------------------------------------------------------------------------------------------------------ No results for input(s): TSH, T4TOTAL, T3FREE, THYROIDAB in the last 72 hours.  Invalid input(s): FREET3 ------------------------------------------------------------------------------------------------------------------ No results for input(s): VITAMINB12, FOLATE, FERRITIN, TIBC, IRON, RETICCTPCT in the last 72 hours.  Coagulation profile No results for input(s): INR, PROTIME in the last 168 hours.  No results for input(s): DDIMER in the last 72 hours.  Cardiac Enzymes No results for input(s): CKMB, TROPONINI, MYOGLOBIN in the last 168 hours.  Invalid input(s): CK ------------------------------------------------------------------------------------------------------------------ Invalid input(s): POCBNP     Time Spent in minutes 30   Susa RaringSINGH,Fenris Cauble K M.D on 02/12/2014 at 10:46 AM  Between 7am to 7pm - Pager - [phone]  After 7pm go to [email or web address] - password [email or web address]  And look for the night coverage person covering for me after hours  [facility] Office  [phone]

## 2014-02-12 NOTE — Progress Notes (Signed)
Orthopedic Tech Progress Note Patient Details:  Karen Gardner [birth year] [id number] No OHF available at this time. When frame becomes available patient will receive one.  Patient ID: Karen Gardner, female   DOB: [birth year], 58 y.o.   MRN: [id number]   [name] 02/12/2014, 9:37 AM

## 2014-02-12 NOTE — Progress Notes (Signed)
Inpatient Diabetes Program Recommendations  AACE/ADA: New Consensus Statement on Inpatient Glycemic Control (2013)  Target Ranges:  Prepandial:   less than 140 mg/dL      Peak postprandial:   less than 180 mg/dL (1-2 hours)      Critically ill patients:  140 - 180 mg/dL   Late entry: Received on call page last night regarding pt with insulin pump and blood sugar in 400-500s, sustained a recent right ankle fracture and had undergone a right ORIF yesterday morning. Pump was removed at [facility] prior to surgery yesterday afternoon. Pt had reconnected insulin pump and RN called Diabetes Coordinator for recommendations. Suggested placing insulin pump orders for pt to manage pump. Recommended BMET to rule out DKA and consultation with Hospitalist for medical management.  Started on insulin pump V-Go 3 weeks ago by Dr. Lucianne MussKumar. Pt states blood sugars were much improved until yesterday.  Basal rate 20 units and meal time boluses of 2-4-6. Family has gone home to bring another V-Go set here for pt to change sites. Blood sugars much improved. Pt is eating well.  PCP - [facility] - Dr. Lucianne MussKumar - working with pt on reducing HgbA1C of 7.6%. On 11/11 - 7.4%.   Results for Karen LoboHICKMAN, Hertha D (MRN [id number]) as of 02/12/2014 13:25  Ref. Range 02/11/2014 17:00 02/11/2014 21:29 02/11/2014 22:00 02/11/2014 23:17 02/11/2014 23:20 02/12/2014 00:03 02/12/2014 04:28 02/12/2014 11:34  Glucose-Capillary Latest Range: 70-99 mg/dL 409212 (H) 811535 (H) 914493 (H) 493 (H) 513 (H) 426 (H) 267 (H) 176 (H)  Results for Karen LoboHICKMAN, Jessi D (MRN [id number]) as of 02/12/2014 13:25  Ref. Range 02/11/2014 18:53  Hgb A1c MFr Bld Latest Range: <5.7 % 7.4 (H)   Results for Karen LoboHICKMAN, Little D (MRN [id number]) as of 02/12/2014 13:25  Ref. Range 02/12/2014 05:10  Sodium Latest Range: 137-147 mEq/L 141  Potassium Latest Range: 3.7-5.3 mEq/L 4.2  Chloride Latest Range: 96-112 mEq/L 103  CO2 Latest Range: 19-32 mEq/L 26  BUN Latest Range: 6-23  mg/dL 33 (H)  Creatinine Latest Range: 0.50-1.10 mg/dL 6.291.28 (H)  Calcium Latest Range: 8.4-10.5 mg/dL 8.9  GFR calc non Af Amer Latest Range: >90 mL/min 45 (L)  GFR calc Af Amer Latest Range: >90 mL/min 52 (L)  Glucose Latest Range: 70-99 mg/dL 528262 (H)  Anion gap Latest Range: 5-15  12    Recommendations: D/C Levemir and follow insulin pump orders for pt to manage pump. Continue Novolog sensitive if blood sugars >121. Pt to put on new V-Go set late this afternoon. Do not feel pump is malfunctioning.  Will continue to follow. Thank you. Ailene Ardshonda Bernyce Brimley, RD, LDN, CDE Inpatient Diabetes Coordinator [phone]

## 2014-02-12 NOTE — Plan of Care (Signed)
Problem: Phase II Progression Outcomes Goal: Vital signs stable Outcome: Completed/Met Date Met:  02/12/14

## 2014-02-12 NOTE — Consult Note (Signed)
Reason for Consult: Uncontrolled blood sugar Referring Physician: Gaspar Skeeters.  Karen Gardner is an 58 y.o. female.  HPI: with history of diabetes mellitus type 2 on insulin pump, hypertension, chronic kidney disease who had sustained a recent right ankle fracture and had undergone a right ORIF yesterday morning was found to have worsening blood sugar last evening. Patient states that she was recently placed on insulin pump 3 weeks ago. Patient's insulin pump was discontinued night prior to this surgery and was just restarted last evening around 9 PM. Patient has also received 15 units of NovoLog bolus subcutaneously. At this time patient's blood sugar has been running around 530. Patient's metabolic panel does not show any signs of DKA. Patient otherwise denies any chest pain shortness of breath nausea vomiting abdominal pain diarrhea fever chills.  Past Medical History  Diagnosis Date  . Hypertension   . Arthritis   . Anemia     has had to have iron infusions-sees dr Jonette Eva  . GERD (gastroesophageal reflux disease)   . Numbness in both hands     mostly at night  . Anxiety   . Diabetes mellitus without complication     Type 2 on insulin pump    Past Surgical History  Procedure Laterality Date  . Hernia repair      umb hernia as child  . Colonoscopy    . Upper gi endoscopy    . Knee arthroscopy  02/12/2012    Procedure: ARTHROSCOPY KNEE;  Surgeon: Kerin Salen, MD;  Location: [facility];  Service: Orthopedics;  Laterality: Right;  Partial Lateral Meniscectomy, Debridement chondromalacia    History reviewed. No pertinent family history.  Social History:  reports that she quit smoking about 42 years ago. She does not have any smokeless tobacco history on file. She reports that she does not drink alcohol or use illicit drugs.  Allergies:  Allergies  Allergen Reactions  . Lactose Intolerance (Gi)   . Oatmeal Other (See Comments)    "Gas"     Medications: I  have reviewed the patient's current medications.  Results for orders placed or performed during the hospital encounter of 02/11/14 (from the past 48 hour(s))  Glucose, capillary     Status: Abnormal   Collection Time: 02/11/14 11:50 AM  Result Value Ref Range   Glucose-Capillary 161 (H) 70 - 99 mg/dL  CBC     Status: Abnormal   Collection Time: 02/11/14 11:58 AM  Result Value Ref Range   WBC 4.1 4.0 - 10.5 K/uL   RBC 3.57 (L) 3.87 - 5.11 MIL/uL   Hemoglobin 10.3 (L) 12.0 - 15.0 g/dL   HCT 32.8 (L) 36.0 - 46.0 %   MCV 91.9 78.0 - 100.0 fL   MCH 28.9 26.0 - 34.0 pg   MCHC 31.4 30.0 - 36.0 g/dL   RDW 13.6 11.5 - 15.5 %   Platelets 174 150 - 400 K/uL  Basic metabolic panel     Status: Abnormal   Collection Time: 02/11/14 11:58 AM  Result Value Ref Range   Sodium 142 137 - 147 mEq/L   Potassium 3.7 3.7 - 5.3 mEq/L   Chloride 103 96 - 112 mEq/L   CO2 24 19 - 32 mEq/L   Glucose, Bld 170 (H) 70 - 99 mg/dL   BUN 34 (H) 6 - 23 mg/dL   Creatinine, Ser 1.22 (H) 0.50 - 1.10 mg/dL   Calcium 9.2 8.4 - 10.5 mg/dL   GFR calc non Af Wyvonnia Lora  47 (L) >90 mL/min   GFR calc Af Amer 55 (L) >90 mL/min    Comment: (NOTE) The eGFR has been calculated using the CKD EPI equation. This calculation has not been validated in all clinical situations. eGFR's persistently <90 mL/min signify possible Chronic Kidney Disease.    Anion gap 15 5 - 15  Glucose, capillary     Status: Abnormal   Collection Time: 02/11/14  1:45 PM  Result Value Ref Range   Glucose-Capillary 158 (H) 70 - 99 mg/dL  Glucose, capillary     Status: Abnormal   Collection Time: 02/11/14  5:00 PM  Result Value Ref Range   Glucose-Capillary 212 (H) 70 - 99 mg/dL   Comment 1 Notify RN   Glucose, capillary     Status: Abnormal   Collection Time: 02/11/14  9:29 PM  Result Value Ref Range   Glucose-Capillary 535 (H) 70 - 99 mg/dL   Comment 1 Notify RN   Glucose, capillary     Status: Abnormal   Collection Time: 02/11/14 10:00 PM  Result  Value Ref Range   Glucose-Capillary 493 (H) 70 - 99 mg/dL  Basic metabolic panel     Status: Abnormal   Collection Time: 02/11/14 11:10 PM  Result Value Ref Range   Sodium 135 (L) 137 - 147 mEq/L    Comment: DELTA CHECK NOTED   Potassium 4.7 3.7 - 5.3 mEq/L    Comment: DELTA CHECK NOTED   Chloride 98 96 - 112 mEq/L   CO2 24 19 - 32 mEq/L   Glucose, Bld 544 (H) 70 - 99 mg/dL   BUN 35 (H) 6 - 23 mg/dL   Creatinine, Ser 1.30 (H) 0.50 - 1.10 mg/dL   Calcium 8.8 8.4 - 10.5 mg/dL   GFR calc non Af Amer 44 (L) >90 mL/min   GFR calc Af Amer 51 (L) >90 mL/min    Comment: (NOTE) The eGFR has been calculated using the CKD EPI equation. This calculation has not been validated in all clinical situations. eGFR's persistently <90 mL/min signify possible Chronic Kidney Disease.    Anion gap 13 5 - 15  Glucose, capillary     Status: Abnormal   Collection Time: 02/11/14 11:17 PM  Result Value Ref Range   Glucose-Capillary 493 (H) 70 - 99 mg/dL  Glucose, capillary     Status: Abnormal   Collection Time: 02/11/14 11:20 PM  Result Value Ref Range   Glucose-Capillary 513 (H) 70 - 99 mg/dL   Comment 1 Notify RN     Dg Ankle 2 Views Right  02/11/2014   CLINICAL DATA:  ORIF, right ankle fracture  EXAM: DG C-ARM 61-120 MIN; RIGHT ANKLE - 2 VIEW  TECHNIQUE: Two intraoperative views of the right ankle  CONTRAST:  None  FLUOROSCOPY TIME:  5 seconds  COMPARISON:  None  FINDINGS: Two views of the right ankle submitted. A metallic fixation plate and screws noted in distal right fibula. A metallic fixation screw is noted in distal tibia medial malleolus. Ankle mortise is preserved. There is anatomic alignment.  IMPRESSION: Metallic fixation plate and screws in distal fibula. Metallic fixation screw in distal right tibia medial malleolus. There is anatomic alignment. Ankle mortise is preserved.   Electronically Signed   By: Lahoma Crocker M.D.   On: 02/11/2014 16:44   Dg C-arm 1-60 Min  02/11/2014   CLINICAL  DATA:  ORIF, right ankle fracture  EXAM: DG C-ARM 61-120 MIN; RIGHT ANKLE - 2 VIEW  TECHNIQUE: Two intraoperative  views of the right ankle  CONTRAST:  None  FLUOROSCOPY TIME:  5 seconds  COMPARISON:  None  FINDINGS: Two views of the right ankle submitted. A metallic fixation plate and screws noted in distal right fibula. A metallic fixation screw is noted in distal tibia medial malleolus. Ankle mortise is preserved. There is anatomic alignment.  IMPRESSION: Metallic fixation plate and screws in distal fibula. Metallic fixation screw in distal right tibia medial malleolus. There is anatomic alignment. Ankle mortise is preserved.   Electronically Signed   By: Lahoma Crocker M.D.   On: 02/11/2014 16:44    Review of Systems  Constitutional: Negative.   HENT: Negative.   Eyes: Negative.   Respiratory: Negative.   Cardiovascular: Negative.   Gastrointestinal: Negative.   Genitourinary: Negative.   Musculoskeletal: Negative.   Skin: Negative.   Neurological: Negative.   Endo/Heme/Allergies: Negative.   Psychiatric/Behavioral: Negative.    Blood pressure 116/45, pulse 111, temperature 98.3 F (36.8 C), temperature source Oral, resp. rate 18, height 5' 5.25" (1.657 m), weight 104.129 kg (229 lb 9 oz), SpO2 97 %. Physical Exam  Constitutional: She is oriented to person, place, and time. She appears well-developed and well-nourished. No distress.  HENT:  Head: Normocephalic and atraumatic.  Right Ear: External ear normal.  Left Ear: External ear normal.  Mouth/Throat: No oropharyngeal exudate.  Eyes: Conjunctivae are normal. Right eye exhibits no discharge. Left eye exhibits no discharge. No scleral icterus.  Neck: Normal range of motion. Neck supple.  Cardiovascular:  Tachycardia.  Respiratory: Effort normal and breath sounds normal. No respiratory distress. She has no wheezes. She has no rales.  GI: Soft. Bowel sounds are normal. She exhibits no distension. There is no tenderness. There is no  rebound.  Musculoskeletal:  Right ankle dressing done.  Neurological: She is alert and oriented to person, place, and time.  Moves all extremities.  Skin: Skin is warm and dry. She is not diaphoretic.  Psychiatric: Her behavior is normal.    Assessment/Plan: #1. Uncontrolled diabetes mellitus type 2 - have discontinued patient's IV fluid which is containing D5. I have ordered one more dose of NovoLog 5 units subcutaneous as patient's repeat CBC was showing to be around 420. Patient's insulin pump has been restarted and will closely follow CBGs. Since patient is tachycardic have placed patient on IV fluids and we will recheck metabolic panel in a few hours and if there is any evidence of developing DKA then we may need to see patient on IV insulin infusion. #2. Mildly tachycardic and febrile - check urinalysis. Patient does not have any respiratory symptoms at this time. I have placed patient on IV fluids. Closely observe. #3. Hypertension - patient usually takes Diovan HCT. Since patient's blood pressure is low normal at this time I have placed patient on when necessary IV hydralazine. #4. Chronic kidney disease stage III - closely follow metabolic panel.  Thanks for involving [place] in patient's care we will follow along with you.  Alexande Sheerin N. 02/12/2014, 12:17 AM

## 2014-02-12 NOTE — Plan of Care (Signed)
Problem: Phase II Progression Outcomes Goal: Discharge plan established Outcome: Completed/Met Date Met:  02/12/14     

## 2014-02-12 NOTE — Plan of Care (Signed)
Problem: Phase II Progression Outcomes Goal: Surgical site without signs of infection Outcome: Completed/Met Date Met:  02/12/14

## 2014-02-12 NOTE — Evaluation (Signed)
Physical Therapy Evaluation Patient Details Name: Karen LoboMarian D Smyers MRN: [id number] DOB: 1955/12/20 Today's Date: 02/12/2014   History of Present Illness  Pt is a 58 y.o. female who fell and sustained ankle fx on Saturday 02/07/14. Pt is s/p ORIF of ankle fx on 02/11/14.  Clinical Impression  Patient is s/p above surgery resulting in functional limitations due to the deficits listed below (see PT Problem List). Patient will benefit from skilled PT to increase their independence and safety with mobility to allow discharge to the venue listed below. Pt performed Sit < > stand x 2 and [facility] (A[facility]. Pt does have 12 steps to get to bedroom. Pt does have bedroom on 1st floor of house but does not want to sleep in it due to height of bed being lower. Pt does report she will have 24/7 (A) from church members and family. Patient needs to practice stairs next session prior to D/C home.      Follow Up Recommendations Home health PT;Supervision/Assistance - 24 hour    Equipment Recommendations  3in1 (PT)    Recommendations for Other Services OT consult     Precautions / Restrictions Precautions Precautions: Fall Required Braces or Orthoses: Other Brace/Splint Other Brace/Splint: CAM walker Restrictions Weight Bearing Restrictions: Yes RLE Weight Bearing: Touchdown weight bearing      Mobility  Bed Mobility Overal bed mobility: Needs Assistance Bed Mobility: Supine to Sit;Sit to Supine     Supine to sit: Min assist;HOB elevated Sit to supine: Min assist   General bed mobility comments: (A) to advance Rt LE to/off EOB and control Rt LE to floor; cues for sequencing  Transfers Overall transfer level: Needs assistance Equipment used: Rolling walker (2 wheeled) Transfers: Sit to/from [employer] CorporationStand;Stand Pivot Transfers Sit to Stand: Min guard;From elevated surface Stand pivot transfers: Min assist;From elevated surface       General transfer comment: initially performing  sit to stand then returned to bed; pt then requesting to use BSC and (A) to BSC; cues for sequencing and (A) to balance and steady RW with SPT; pt with difficulty maintaining TDWB due to weight of CAM walker   Ambulation/Gait             General Gait Details: [facility]  Stairs            Wheelchair Mobility    Modified Rankin (Stroke Patients Only)       Balance Overall balance assessment: Needs assistance Sitting-balance support: Feet supported;No upper extremity supported Sitting balance-Leahy Scale: Fair Sitting balance - Comments: guarded and initially c/o dizziness; subsiding qith ~5 min of sitting   Standing balance support: During functional activity;Bilateral upper extremity supported Standing balance-Leahy Scale: Poor Standing balance comment: requiring RW for balance                             Pertinent Vitals/Pain Pain Assessment: 0-10 Pain Score: 5  Pain Location: Rt ankle Pain Descriptors / Indicators: Burning;Tingling Pain Intervention(s): Monitored during session;Premedicated before session;Repositioned    Home Living Family/patient expects to be discharged to:: Private residence Living Arrangements: Non-relatives/Friends Available Help at Discharge: Friend(s);Family;Available 24 hours/day Type of Home: House Home Access: Stairs to enter Entrance Stairs-Rails: None Entrance Stairs-Number of Steps: 1 Home Layout: Two level;Bed/bath upstairs Home Equipment: Walker - 2 wheels;Transport chair Additional Comments: pt does have bedroom on first floor of house but reports it is a kids bed  and too low for her; pt stated she has worked out for church members to be with her 24/7 upon D/C     Prior Function Level of Independence: Independent               Hand Dominance        Extremity/Trunk Assessment   Upper Extremity Assessment: Defer to OT evaluation           Lower Extremity Assessment: RLE  deficits/detail RLE Deficits / Details: limited due to pain; was able to advance towards EOB minimaly; difficulty keeping foot off ground due to CAM walker    Cervical / Trunk Assessment: Normal  Communication   Communication: No difficulties  Cognition Arousal/Alertness: Awake/alert Behavior During Therapy: WFL for tasks assessed/performed Overall Cognitive Status: Within Functional Limits for tasks assessed                      General Comments General comments (skin integrity, edema, etc.): discussed D/C recommendations with pt; will plan to address stair management tomorrow     Exercises        Assessment/Plan    PT Assessment Patient needs continued PT services  PT Diagnosis Difficulty walking;Generalized weakness;Acute pain   PT Problem List Decreased strength;Decreased range of motion;Decreased activity tolerance;Decreased balance;Decreased mobility;Decreased knowledge of use of DME;Decreased knowledge of precautions;Pain;Obesity  PT Treatment Interventions DME instruction;Gait training;Stair training;Functional mobility training;Therapeutic activities;Therapeutic exercise;Balance training;Neuromuscular re-education;Patient/family education   PT Goals (Current goals can be found in the Care Plan section) Acute Rehab PT Goals Patient Stated Goal: to be safe when i go home PT Goal Formulation: With patient Time For Goal Achievement: 02/19/14 Potential to Achieve Goals: Good    Frequency Min 4X/week   Barriers to discharge Inaccessible home environment 12 steps to get to bedroom    Co-evaluation               End of Session Equipment Utilized During Treatment: Gait belt Activity Tolerance: Patient tolerated treatment well Patient left: in bed;with call bell/phone within reach;with family/visitor present Nurse Communication: Mobility status;Weight bearing status    Functional Assessment Tool Used: clinical judgement  Functional Limitation: Mobility:  Walking and moving around Mobility: Walking and Moving Around Current Status ([id number]): At least 20 percent but less than 40 percent impaired, limited or restricted Mobility: Walking and Moving Around Goal Status [phone]([id number]): At least 1 percent but less than 20 percent impaired, limited or restricted    Time: 1355-1420 PT Time Calculation (min) (ACUTE ONLY): 25 min   Charges:   PT Evaluation $Initial PT Evaluation Tier I: 1 Procedure PT Treatments $Therapeutic Activity: 8-22 mins   PT G Codes:   Functional Assessment Tool Used: clinical judgement  Functional Limitation: Mobility: Walking and moving around    [place], [place] CreekBrittany N, [place]PT  [phone] 02/12/2014, 2:45 PM

## 2014-02-12 NOTE — Progress Notes (Addendum)
Patient ID: Karen Gardner, female   DOB: [birth year], 58 y.o.   MRN: [id number] PATIENT ID: Karen Gardner  MRN: [id number]  DOB/AGE:  [birth year] / 58 y.o.  1 Day Post-Op Procedure(s) (LRB): OPEN REDUCTION INTERNAL FIXATION (ORIF) RIGHT ANKLE FRACTURE (Right)    PROGRESS NOTE Subjective:   Patient is alert, oriented, yes Nausea, no Vomiting, no passing gas, no Bowel Movement. Taking PO well. Denies SOB, Chest or Calf Pain. Using Incentive Spirometer, PAS in place. Ambulate 10 lbs WB RLE, Patient reports pain as 5 on 0-10 scale, patient is on a new insulin pump for the last 3 weeks and her sugars went up to 527 yesterday. Medicine was consult it and most recent blood sugar is 187 and the patient feels well. She is trying to arrange home help through her church and that will probably be available tomorrow.    Objective: Vital signs in last 24 hours: Temp:  [97.9 F (36.6 C)-99 F (37.2 C)] 98.9 F (37.2 C) (11/12 0609) Pulse Rate:  [76-111] 98 (11/12 0609) Resp:  [12-22] 18 (11/12 0609) BP: (116-153)/(45-86) 123/66 mmHg (11/12 0609) SpO2:  [91 %-100 %] 96 % (11/12 0609) Weight:  [104.129 kg (229 lb 9 oz)] 104.129 kg (229 lb 9 oz) (11/11 1159)    Intake/Output from previous day: I/O last 3 completed shifts: In: 1440 [P.O.:240; I.V.:1200] Out: 60 [Blood:60]   Intake/Output this shift:     LABORATORY DATA:  Recent Labs  02/11/14 1158  02/11/14 2310  02/11/14 2320 02/12/14 0003 02/12/14 0428 02/12/14 0510  WBC 4.1  --   --   --   --   --   --  5.7  HGB 10.3*  --   --   --   --   --   --  8.6*  HCT 32.8*  --   --   --   --   --   --  27.3*  PLT 174  --   --   --   --   --   --  176  NA 142  --  135*  --   --   --   --  141  K 3.7  --  4.7  --   --   --   --  4.2  CL 103  --  98  --   --   --   --  103  CO2 24  --  24  --   --   --   --  26  BUN 34*  --  35*  --   --   --   --  33*  CREATININE 1.22*  --  1.30*  --   --   --   --  1.28*  GLUCOSE 170*  --  544*  --   --   --    --  262*  GLUCAP  --   < >  --   < > 513* 426* 267*  --   CALCIUM 9.2  --  8.8  --   --   --   --  8.9  < > = values in this interval not displayed.  Examination: Neurologically intact ABD soft Neurovascular intact Sensation intact distally Intact pulses distally Dorsiflexion/Plantar flexion intact Incision: dressing C/D/I} Moves toes without difficulty. Subjective numbness over the dorsal aspect of the right great toe but no discomfort. Intraoperative x-ray showed anatomic reduction of the bicondylar fracture. Assessment:   1 Day Post-Op Procedure(s) (LRB):  OPEN REDUCTION INTERNAL FIXATION (ORIF) RIGHT ANKLE FRACTURE (Right) ADDITIONAL DIAGNOSIS:  Diabetes and Hypertension, chronic renal failure stage III  Plan:  Partial Weight Bearing @ 10% (PWB), Diabetic management will be per internal medicine.  DVT Prophylaxis:  Aspirin  DISCHARGE PLAN: Home, patient is attempting to arrange help at home. She lives with her 60 year old mother who will not be able to help her with activities of daily living. She thinks that through her church she will be able to get help 8-10 hours a day which is necessary. The plan is for discharge tomorrow.  DISCHARGE NEEDS: HHPT, Walker and 3-in-1 comode seat     Georges Victorio J 02/12/2014, 9:06 AM

## 2014-02-12 NOTE — Plan of Care (Signed)
Problem: Phase II Progression Outcomes Goal: Progress activity as tolerated unless otherwise ordered Outcome: Completed/Met Date Met:  02/12/14 Goal: Progressing with IS, TCDB Outcome: Completed/Met Date Met:  02/12/14 Goal: Dressings dry/intact Outcome: Completed/Met Date Met:  02/12/14 Goal: Foley discontinued Outcome: Not Applicable Date Met:  42/55/25

## 2014-02-12 NOTE — Plan of Care (Signed)
Problem: Phase II Progression Outcomes Goal: Pain controlled Outcome: Completed/Met Date Met:  02/12/14

## 2014-02-12 NOTE — Progress Notes (Signed)
UR completed 

## 2014-02-12 NOTE — Plan of Care (Signed)
Problem: Phase II Progression Outcomes Goal: Tolerating diet Outcome: Completed/Met Date Met:  02/12/14     

## 2014-02-12 NOTE — Progress Notes (Signed)
Pt uses insulin pump from home. Spoke with Diabetes Coordinator regarding insulin pump. PA on call was paged to restart patients insulin pump and continue her home regimen. Patient resumed her insulin pump and gave herself a bolus at that time. Per patient, she gives herself a bolus (2units) three times a day, breakfast, lunch and dinner. Bedtime blood sugar checked by Nurse tech showed an elevated BS of 535. Diabetes Coordinator and Pa on call was notified. Orders was given by PA for SSI and to give patient 15units of Novolog insulin. 15Units of novolog was give at 2207. Blood sugar rechecked an hour later resulted in 493 and 513. PA on call was renotified of the high blood sugar after correction. Medical was then consulted by the PA to see the patient. Orders given by MD. Toniann FailKakrakandy. Stat CBG taken, BS 426. 5 units of novolog administered and CBG will be checked Q4hours. Patient is resting comfortably. Will continue to monitor CBG's.

## 2014-02-13 DIAGNOSIS — S82841A Displaced bimalleolar fracture of right lower leg, initial encounter for closed fracture: Secondary | ICD-10-CM | POA: Diagnosis not present

## 2014-02-13 LAB — GLUCOSE, CAPILLARY
GLUCOSE-CAPILLARY: 124 mg/dL — AB (ref 70–99)
GLUCOSE-CAPILLARY: 138 mg/dL — AB (ref 70–99)
GLUCOSE-CAPILLARY: 132 mg/dL — AB (ref 70–99)
Glucose-Capillary: 111 mg/dL — ABNORMAL HIGH (ref 70–99)
Glucose-Capillary: 135 mg/dL — ABNORMAL HIGH (ref 70–99)
Glucose-Capillary: 177 mg/dL — ABNORMAL HIGH (ref 70–99)

## 2014-02-13 MED ORDER — INSULIN ASPART 100 UNIT/ML ~~LOC~~ SOLN
0.0000 [IU] | Freq: Three times a day (TID) | SUBCUTANEOUS | Status: DC
  Administered 2014-02-14: 3 [IU] via SUBCUTANEOUS
  Administered 2014-02-14: 11 [IU] via SUBCUTANEOUS

## 2014-02-13 NOTE — Progress Notes (Signed)
PATIENT ID: Karen LoboMarian D Gidley  MRN: [id number]  DOB/AGE:  60/19/1957 / 60 y.o.  2 Days Post-Op Procedure(s) (LRB): OPEN REDUCTION INTERNAL FIXATION (ORIF) RIGHT ANKLE FRACTURE (Right)    PROGRESS NOTE Subjective:   Patient is alert, oriented, no Nausea, no Vomiting, yes passing gas, yes Bowel Movement. Taking PO small bites. Denies SOB, Chest or Calf Pain. Using Incentive Spirometer, PAS in place. Ambulate Touch toe weight bearing with a walker, Patient reports pain as moderate,     Objective: Vital signs in last 24 hours: Temp:  [98.3 F (36.8 C)-99.4 F (37.4 C)] 98.3 F (36.8 C) (11/13 0806) Pulse Rate:  [100-105] 105 (11/13 0806) Resp:  [18] 18 (11/13 0545) BP: (124-145)/(64-74) 124/64 mmHg (11/13 0806) SpO2:  [93 %-96 %] 96 % (11/13 0806)    Intake/Output from previous day: I/O last 3 completed shifts: In: 1780 [P.O.:960; I.V.:600; Other:220] Out: -    Intake/Output this shift: Total I/O In: 240 [P.O.:240] Out: -    LABORATORY DATA:  Recent Labs  02/11/14 1158  02/11/14 2310  02/12/14 0510  02/13/14 0404 02/13/14 0825 02/13/14 1115  WBC 4.1  --   --   --  5.7  --   --   --   --   HGB 10.3*  --   --   --  8.6*  --   --   --   --   HCT 32.8*  --   --   --  27.3*  --   --   --   --   PLT 174  --   --   --  176  --   --   --   --   NA 142  --  135*  --  141  --   --   --   --   K 3.7  --  4.7  --  4.2  --   --   --   --   CL 103  --  98  --  103  --   --   --   --   CO2 24  --  24  --  26  --   --   --   --   BUN 34*  --  35*  --  33*  --   --   --   --   CREATININE 1.22*  --  1.30*  --  1.28*  --   --   --   --   GLUCOSE 170*  --  544*  --  262*  --   --   --   --   GLUCAP  --   < >  --   < >  --   < > 124* 138* 111*  CALCIUM 9.2  --  8.8  --  8.9  --   --   --   --   < > = values in this interval not displayed.  Examination: Neurologically intact Neurovascular intact Sensation intact distally Dorsiflexion/Plantar flexion intact No cellulitis  present Compartment soft}  Assessment:   2 Days Post-Op Procedure(s) (LRB): OPEN REDUCTION INTERNAL FIXATION (ORIF) RIGHT ANKLE FRACTURE (Right) ADDITIONAL DIAGNOSIS:  Diabetes  Plan:  Touch Down Weight Bearing (TDWB)  DVT Prophylaxis:  Aspirin  DISCHARGE PLAN: Home, though pt is discussing SNF with Child psychotherapistocial Worker.  Ok to DC when she passes her PT goals.  DISCHARGE NEEDS: HHPT, HHRN, Walker and 3-in-1 comode seat     Reene Harlacher R  02/13/2014, 11:27 AM

## 2014-02-13 NOTE — Clinical Social Work Placement (Addendum)
Clinical Social Work Department CLINICAL SOCIAL WORK PLACEMENT NOTE 02/13/2014  Patient:  Josiah LoboHICKMAN,Jaylianna D  Account Number:  [id number] Admit date:  02/16/2004  Clinical Social Worker:  Mosie EpsteinEMILY S Clearnce Leja, LCSWA  Date/time:  02/13/2014 10:01 AM  Clinical Social Work is seeking post-discharge placement for this patient at the following level of care:   SKILLED NURSING   (*CSW will update this form in Epic as items are completed)   02/13/2014  [facility]family provided with [facility] Clinical [facility] Work's list of facilities offering this level of care within the geographic area requested by the patient (or if unable, by the patient's family).  02/13/2014  Patient/family informed of their freedom to choose among providers that offer the needed level of care, that participate in Medicare, Medicaid or managed care program needed by the patient, have an available bed and are willing to accept the patient.  02/13/2014  Patient/family informed of [facility]' ownership interest in [facility], as well as of the fact that they are under no obligation to receive care at this facility.  PASARR submitted to EDS on 02/13/2014 PASARR number received on 02/13/2014  FL2 transmitted to all facilities in geographic area requested by pt/family on  02/13/2014 FL2 transmitted to all facilities within larger geographic area on   Patient informed that his/her managed care company has contracts with or will negotiate with  certain facilities, including the following:     Patient/family informed of bed offers received:  02/14/2014 Patient chooses bed at [facility] Physician recommends and patient chooses bed at    Patient to be transferred to  [facility] on  02/14/2014 Patient to be transferred to facility by [facility] Patient and family notified of transfer on 02/14/2014 Name of family member notified:  Patient notified at bedside, patient to notify family regarding  discharge.  The following physician request were entered in Epic:   Additional Comments:  Lily Kochermily Pacen Watford, LCSWA [phone]) Licensed Clinical Social Worker Orthopedics [phone]([id number]) and Surgical [phone]([id number])

## 2014-02-13 NOTE — Progress Notes (Signed)
Physical Therapy Treatment Patient Details Name: Karen LoboMarian D Vandiver MRN: [id number] DOB: 09/17/1955 Today's Date: 02/13/2014    History of Present Illness Pt is a 58 y.o. female who fell and sustained ankle fx on Saturday 02/07/14. Pt is s/p ORIF of ankle fx on 02/11/14.    PT Comments    Pt requiring mod (A) for mobility and having incr difficulty hopping on Lt foot and balancing while maintaining TDWB status on Rt foot. D/C disposition updated to SNF for post acute rehab. Pt 78 y.o. Mother is unable to provide physical (A) needed for safe D/C home at this time.   Follow Up Recommendations  SNF;Supervision/Assistance - 24 hour     Equipment Recommendations  3in1 (PT)    Recommendations for Other Services       Precautions / Restrictions Precautions Precautions: Fall Required Braces or Orthoses: Other Brace/Splint Other Brace/Splint: CAM walker Restrictions Weight Bearing Restrictions: Yes RLE Weight Bearing: Touchdown weight bearing    Mobility  Bed Mobility Overal bed mobility: Needs Assistance Bed Mobility: Supine to Sit     Supine to sit: Min assist;HOB elevated     General bed mobility comments: (A) to advance Rt LE to/off EOB and control to ground safely  Transfers Overall transfer level: Needs assistance Equipment used: Rolling walker (2 wheeled) Transfers: Sit to/from [employer] CorporationStand;Stand Pivot Transfers Sit to Stand: Min assist;From elevated surface Stand pivot transfers: Min assist;From elevated surface       General transfer comment: [facility]; cues for sequencing and to extend arms and perform "hop to " steps vs sliding Lt LE; cues to maintain TDWB status throughout; pt with difficulty keeping Rt LE off floor due to weight of CAM walker   Ambulation/Gait Ambulation/Gait assistance: Mod assist Ambulation Distance (Feet): 4 Feet Assistive device: Rolling walker (2 wheeled) Gait Pattern/deviations:  Step-to pattern ("hop to") Gait velocity: very decreased Gait velocity interpretation: Below normal speed for age/gender General Gait Details: pt with incr difficulty maintaining TDWB status on Rt LE due to weight of CAM walker; cues for sequencing and safety with RW; pt attempting to slide Lt LE vs hopping; mod (A) to balance and max cues for upright posture    Stairs Stairs: Yes Stairs assistance: Mod assist Stair Management: No rails;Step to pattern;With walker;Backwards Number of Stairs: 1 General stair comments: mod (A) to balance and block RW; cues for sequencing and technique   Wheelchair Mobility    Modified Rankin (Stroke Patients Only)       Balance Overall balance assessment: Needs assistance Sitting-balance support: Feet supported;No upper extremity supported Sitting balance-Leahy Scale: Good Sitting balance - Comments: denied any dizziness sitting EOB   Standing balance support: During functional activity;Bilateral upper extremity supported Standing balance-Leahy Scale: Poor Standing balance comment: (A) and RW to balance                     Cognition Arousal/Alertness: Awake/alert Behavior During Therapy: WFL for tasks assessed/performed Overall Cognitive Status: Within Functional Limits for tasks assessed                      Exercises      General Comments General comments (skin integrity, edema, etc.): at length discussion regarding D/C planning; pt now stating church people will not be able to (A) 24/7 and mother cannot provide physical (A)       Pertinent Vitals/Pain Pain Assessment: 0-10 Pain Score: 6  Pain Location: Rt ankle Pain Descriptors / Indicators: Burning;Shooting Pain Intervention(s): Monitored during session;Premedicated before session;Repositioned    Home Living Family/patient expects to be discharged to:: Private residence Living Arrangements: Parent Available Help at Discharge: Family;Friend(s);Available  PRN/intermittently (pt lives with mother, but she is unable to provide physical ) Type of Home: House Home Access: Stairs to enter Entrance Stairs-Rails: None Home Layout: Two level;Bed/bath upstairs Home Equipment: Environmental consultantWalker - 2 wheels;Transport chair Additional Comments: Pt has a low bed on the first floor of her house but states she has "fallen out of it." Pt reports that her mother is not able to provide the physical assist needed for pt to be safe. Pt also reports that she will not have 24/7 assistance from church members.     Prior Function Level of Independence: Independent          PT Goals (current goals can now be found in the care plan section) Acute Rehab PT Goals Patient Stated Goal: to go get some rehab then home so im safe and dnot have to rely on my 60 year old mother PT Goal Formulation: With patient Time For Goal Achievement: 02/19/14 Potential to Achieve Goals: Good Progress towards PT goals: Progressing toward goals    Frequency  Min 3X/week    PT Plan Discharge plan needs to be updated    Co-evaluation PT/OT/SLP Co-Evaluation/Treatment: Yes Reason for Co-Treatment: For patient/therapist safety PT goals addressed during session: Mobility/safety with mobility;Balance;Proper use of DME OT goals addressed during session: ADL's and self-care     End of Session Equipment Utilized During Treatment: Gait belt;Other (comment) (CAM walker) Activity Tolerance: Patient tolerated treatment well Patient left: in chair;with call bell/phone within reach     Time: 0852-0920 PT Time Calculation (min) (ACUTE ONLY): 28 min  Charges:  $Gait Training: 8-22 mins                    G Codes:  Functional Assessment Tool Used: clinical judgement  Functional Limitation: Mobility: Walking and moving around Mobility: Walking and Moving Around Current Status [phone](G8978): At least 20 percent but less than 40 percent impaired, limited or restricted Mobility: Walking and Moving Around  Goal Status [phone](G8979): At least 20 percent but less than 40 percent impaired, limited or restricted Mobility: Walking and Moving Around Discharge Status [phone](G8980): At least 20 percent but less than 40 percent impaired, limited or restricted   Donell SievertWest, Falynn Ailey N, [place]PT  [phone] 02/13/2014, 11:27 AM

## 2014-02-13 NOTE — Clinical Social Work Psychosocial (Signed)
Clinical Social Work Department BRIEF PSYCHOSOCIAL ASSESSMENT 02/13/2014  Patient:  Karen Gardner, Karen Gardner     Account Number:  [id number]     Admit date:  02/16/2004  Clinical Social Worker:  Delrae Sawyers  Date/Time:  02/13/2014 09:56 AM  Referred by:  Physician  Date Referred:  02/13/2014 Referred for  SNF Placement   Other Referral:   none.   Interview type:  Patient Other interview type:   none.    PSYCHOSOCIAL DATA Living Status:  FAMILY Admitted from facility:   Level of care:   Primary support name:  Legrand Como Primary support relationship to patient:  PARENT Degree of support available:   Strong support system.    CURRENT CONCERNS Current Concerns  Post-Acute Placement   Other Concerns:   none.    SOCIAL WORK ASSESSMENT / PLAN CSW received referral from RN and PT stating pt requesting SNF placement at time of discharge. CSW met with pt at bedside to discuss discharge disposition. Pt stated she lives at home with pt's mother, but does not feel it would be safe to return home at time of discharge due to limited mobility. Pt informed CSW pt would prefer short-term placement to complete rehabilitation prior to discharging home. CSW to continue to follow and assist with discharge planning needs.   Assessment/plan status:  Psychosocial Support/Ongoing Assessment of Needs Other assessment/ plan:   none.   Information/referral to community resources:   [facility] bed offers.    PATIENT'S/FAMILY'S RESPONSE TO PLAN OF CARE: Pt understanding and agreeable to CSW plan of care. Pt expressed no further questions or concerns at this time.       Lubertha Sayres, [place] ([phone]) Licensed Clinical Social Worker Orthopedics [phone]) and Surgical [phone])

## 2014-02-13 NOTE — Discharge Summary (Addendum)
Patient ID: Karen Gardner MRN: [id number] DOB/AGE: 60/10/1955 60 y.o.  Admit date: 02/11/2014 Discharge date: 02/14/2014  Admission Diagnoses:  Active Problems:   Ankle fracture, bimalleolar, closed   Diabetes mellitus type 2, uncontrolled   Essential hypertension   Discharge Diagnoses:  Same  Past Medical History  Diagnosis Date  . Hypertension   . Arthritis   . Anemia     has had to have iron infusions-sees dr Jonette Eva  . GERD (gastroesophageal reflux disease)   . Numbness in both hands     mostly at night  . Anxiety   . Diabetes mellitus without complication     Type 2 on insulin pump    Surgeries: Procedure(s): OPEN REDUCTION INTERNAL FIXATION (ORIF) RIGHT ANKLE FRACTURE on 02/11/2014   Consultants:    Discharged Condition: Improved  Hospital Course: Karen Gardner is an 58 y.o. female who was admitted 02/11/2014 for operative treatment of<principal problem not specified>. Patient has severe unremitting pain that affects sleep, daily activities, and work/hobbies. After pre-op clearance the patient was taken to the operating room on 02/11/2014 and underwent  Procedure(s): OPEN REDUCTION INTERNAL FIXATION (ORIF) RIGHT ANKLE FRACTURE.    Patient was given perioperative antibiotics: Anti-infectives    Start     Dose/Rate Route Frequency Ordered Stop   02/11/14 1204  ceFAZolin (ANCEF) 2-3 GM-% IVPB SOLR    Comments:  Ara Kussmaul   : cabinet override      02/11/14 1204 02/11/14 1515   02/11/14 1200  ceFAZolin (ANCEF) IVPB 2 g/50 mL premix  Status:  Discontinued     2 g100 mL/hr over 30 Minutes Intravenous On call to O.R. 02/11/14 1156 02/11/14 1758       Patient was given sequential compression devices, early ambulation, and chemoprophylaxis to prevent DVT.  Patient benefited maximally from hospital stay and there were no complications.    Recent vital signs: Patient Vitals for the past 24 hrs:  BP Temp Temp src Pulse Resp SpO2  02/13/14 0806 124/64 mmHg  98.3 F (36.8 C) Oral (!) 105 - 96 %  02/13/14 0545 138/69 mmHg 98.9 F (37.2 C) - 100 18 93 %  02/12/14 2112 (!) 145/74 mmHg 99.4 F (37.4 C) - (!) 103 18 94 %  02/12/14 1300 132/68 mmHg 99.4 F (37.4 C) - (!) 103 18 96 %     Recent laboratory studies:  Recent Labs  02/11/14 1158 02/11/14 2310 02/12/14 0510  WBC 4.1  --  5.7  HGB 10.3*  --  8.6*  HCT 32.8*  --  27.3*  PLT 174  --  176  NA 142 135* 141  K 3.7 4.7 4.2  CL 103 98 103  CO2 24 24 26   BUN 34* 35* 33*  CREATININE 1.22* 1.30* 1.28*  GLUCOSE 170* 544* 262*  CALCIUM 9.2 8.8 8.9     Discharge Medications:     Medication List    TAKE these medications        aspirin EC 325 MG tablet  Take 1 tablet (325 mg total) by mouth 2 (two) times daily.     escitalopram 10 MG tablet  Commonly known as:  LEXAPRO  Take 10 mg by mouth daily.     Exenatide ER 2 MG Pen  Inject into the skin every Saturday.     glucose blood test strip  Commonly known as:  ONETOUCH VERIO  Use as instructed to check blood sugar once a day dx code E11.65     ibuprofen  200 MG tablet  Commonly known as:  ADVIL,MOTRIN  Take 400 mg by mouth every 6 (six) hours as needed for mild pain.     insulin aspart 100 UNIT/ML injection  Commonly known as:  NOVOLOG  Use max 56 units per day with V-Go     lansoprazole 30 MG capsule  Commonly known as:  PREVACID  Take 30 mg by mouth daily.     methocarbamol 500 MG tablet  Commonly known as:  ROBAXIN  Take 1 tablet (500 mg total) by mouth 2 (two) times daily with a meal.     ONETOUCH DELICA LANCETS FINE Misc  Use to check blood sugar once a day dx code E11.65     oxyCODONE-acetaminophen 10-325 MG per tablet  Commonly known as:  PERCOCET  Take 0.5-1 tablets by mouth every 6 (six) hours as needed for pain.     V-GO 20 Kit  Use one per day        Diagnostic Studies: Dg Ankle 2 Views Right  Mar 08, 2014   CLINICAL DATA:  ORIF, right ankle fracture  EXAM: DG C-ARM 61-120 MIN; RIGHT ANKLE - 2  VIEW  TECHNIQUE: Two intraoperative views of the right ankle  CONTRAST:  None  FLUOROSCOPY TIME:  5 seconds  COMPARISON:  None  FINDINGS: Two views of the right ankle submitted. A metallic fixation plate and screws noted in distal right fibula. A metallic fixation screw is noted in distal tibia medial malleolus. Ankle mortise is preserved. There is anatomic alignment.  IMPRESSION: Metallic fixation plate and screws in distal fibula. Metallic fixation screw in distal right tibia medial malleolus. There is anatomic alignment. Ankle mortise is preserved.   Electronically Signed   By: Lahoma Crocker M.D.   On: 03-08-2014 16:44   Dg C-arm 1-60 Min  03/08/2014   CLINICAL DATA:  ORIF, right ankle fracture  EXAM: DG C-ARM 61-120 MIN; RIGHT ANKLE - 2 VIEW  TECHNIQUE: Two intraoperative views of the right ankle  CONTRAST:  None  FLUOROSCOPY TIME:  5 seconds  COMPARISON:  None  FINDINGS: Two views of the right ankle submitted. A metallic fixation plate and screws noted in distal right fibula. A metallic fixation screw is noted in distal tibia medial malleolus. Ankle mortise is preserved. There is anatomic alignment.  IMPRESSION: Metallic fixation plate and screws in distal fibula. Metallic fixation screw in distal right tibia medial malleolus. There is anatomic alignment. Ankle mortise is preserved.   Electronically Signed   By: Lahoma Crocker M.D.   On: 2014-03-08 16:44    Disposition: 01-Home or Self Care      Discharge Instructions    Call MD / Call 911    Complete by:  As directed   If you experience chest pain or shortness of breath, CALL 911 and be transported to the hospital emergency room.  If you develope a fever above 101 F, pus (white drainage) or increased drainage or redness at the wound, or calf pain, call your surgeon's office.     Change dressing    Complete by:  As directed   You may change your dressing on day 5, then change the dressing daily with sterile 4 x 4 inch gauze dressing and paper tape.   You may clean the incision with alcohol prior to redressing     Constipation Prevention    Complete by:  As directed   Drink plenty of fluids.  Prune juice may be helpful.  You may use a stool softener,  such as Colace (over the counter) 100 mg twice a day.  Use MiraLax (over the counter) for constipation as needed.     Diet - low sodium heart healthy    Complete by:  As directed      Discharge instructions    Complete by:  As directed   Follow up in office with Dr. Mayer Camel in 10 days.     Driving restrictions    Complete by:  As directed   No driving for 2 weeks     Increase activity slowly as tolerated    Complete by:  As directed      Partial weight bearing    Complete by:  As directed   % Body Weight:  touch toe weight bearing  Laterality:  right  Extremity:  Lower     Patient may shower    Complete by:  As directed   You may shower without a dressing once there is no drainage.  Do not wash over the wound.  If drainage remains, cover wound with plastic wrap and then shower.           Follow-up Information    Follow up with Kerin Salen, MD In 10 days.   Specialty:  Orthopedic Surgery   Contact information:   [place] [address] [phone]       Follow up with [facility].   Why:  Someone from [facility] will contact you concerning start date and time for therapy.   Contact information:   [address] [place] [address] [phone]        Signed: Theodosia Quay 02/13/2014, 11:33 AM

## 2014-02-13 NOTE — Progress Notes (Signed)
Occupational Therapy Evaluation Patient Details Name: Karen LoboMarian D Kabir MRN: [id number] DOB: [birth year] Today's Date: 02/13/2014    History of Present Illness Pt is a 58 y.o. female who fell and sustained ankle fx on Saturday 02/07/14. Pt is s/p ORIF of ankle fx on 02/11/14.   Clinical Impression   PTA pt lived at home and was independent with ADLs and functional mobility. Pt is currently limited by Rt LE pain and weakness resulting in difficulty with transfers. Pt requires min-mod (A) for LB ADLs and would be limited at home due to decreased caregiver support and difficulty managing home environment. At this time, feel that SNF would be safest option for pt. Pt will benefit from acute OT to address functional transfers and LB ADLs. If pt is unable to go to SNF, she will need HHOT to support her d/c home.     Follow Up Recommendations  SNF;Supervision/Assistance - 24 hour    Equipment Recommendations  3 in 1 bedside comode    Recommendations for Other Services       Precautions / Restrictions Precautions Precautions: Fall Required Braces or Orthoses: Other Brace/Splint Other Brace/Splint: CAM walker Restrictions Weight Bearing Restrictions: Yes RLE Weight Bearing: Touchdown weight bearing      Mobility Bed Mobility Overal bed mobility: Needs Assistance Bed Mobility: Supine to Sit     Supine to sit: Min assist;HOB elevated     General bed mobility comments: (A) to advance Rt LE to/off EOB and control Rt LE to floor; cues for sequencing  Transfers Overall transfer level: Needs assistance Equipment used: Rolling walker (2 wheeled) Transfers: Sit to/from UGI CorporationStand;Stand Pivot Transfers Sit to Stand: Min assist Stand pivot transfers: Min assist       General transfer comment: VC's for sequencing and safety. Pt requires cueing to maintain WB status during transfers. Pt demonstrated difficulty remembering hand placement for sit<>stand. Stood from bed (raised), BSC, and  recliner. Pt requires (A) to maintain balance during SPT with cues to stand up tall and push through arms.     Balance Overall balance assessment: Needs assistance Sitting-balance support: No upper extremity supported;Feet supported Sitting balance-Leahy Scale: Fair     Standing balance support: Bilateral upper extremity supported;During functional activity Standing balance-Leahy Scale: Poor Standing balance comment: Requires Bil UE support on RW for static balance                            ADL Overall ADL's : Needs assistance/impaired Eating/Feeding: Independent;Sitting   Grooming: Set up;Sitting   Upper Body Bathing: Set up;Sitting   Lower Body Bathing: Moderate assistance;Sit to/from stand   Upper Body Dressing : Set up;Sitting   Lower Body Dressing: Maximal assistance;+2 for safety/equipment;Sit to/from stand   Toilet Transfer: Minimal assistance;Stand-pivot;BSC;RW   Toileting- Clothing Manipulation and Hygiene: Minimal assistance;Sit to/from stand Toileting - Clothing Manipulation Details (indicate cue type and reason): (A) to stand and assist to maintain balance during toilet hygiene Tub/ Shower Transfer: Walk-in shower;Minimal assistance;+2 for safety/equipment;3 in Scientist, water quality1;Rolling walker Tub/Shower Transfer Details (indicate cue type and reason): pt practiced technique of backing up to step/shower and hopping over with Left leg. Educated pt on use of 3N1 in shower, however recommended that pt can sponge bathe   General ADL Comments: Pt is limited by pain and difficulty maintaining TDWB status.      Vision  Pt reports no change from baseline.  Perception Perception Perception Tested?: No   Praxis Praxis Praxis tested?: Within functional limits    Pertinent Vitals/Pain Pain Assessment: 0-10 Pain Score: 6  Pain Location: Rt ankle Pain Descriptors / Indicators: Burning;Tingling Pain Intervention(s): Limited activity within  patient's tolerance;Monitored during session;Repositioned;Utilized relaxation techniques     Hand Dominance     Extremity/Trunk Assessment Upper Extremity Assessment Upper Extremity Assessment: Overall WFL for tasks assessed   Lower Extremity Assessment Lower Extremity Assessment: Defer to PT evaluation   Cervical / Trunk Assessment Cervical / Trunk Assessment: Normal   Communication Communication Communication: No difficulties   Cognition Arousal/Alertness: Awake/alert Behavior During Therapy: WFL for tasks assessed/performed Overall Cognitive Status: Within Functional Limits for tasks assessed                                Home Living Family/patient expects to be discharged to:: Private residence Living Arrangements: Parent Available Help at Discharge: Family;Friend(s);Available PRN/intermittently (pt lives with mother, but she is unable to provide physical ) Type of Home: House Home Access: Stairs to enter Entergy CorporationEntrance Stairs-Number of Steps: 1 Entrance Stairs-Rails: None Home Layout: Two level;Bed/bath upstairs Alternate Level Stairs-Number of Steps: 12 Alternate Level Stairs-Rails: Right Bathroom Shower/Tub: Walk-in shower;Door   Foot LockerBathroom Toilet: Standard     Home Equipment: Environmental consultantWalker - 2 wheels;Transport chair   Additional Comments: Pt has a low bed on the first floor of her house but states she has "fallen out of it." Pt reports that her mother is not able to provide the physical assist needed for pt to be safe. Pt also reports that she will not have 24/7 assistance from church members.       Prior Functioning/Environment Level of Independence: Independent             OT Diagnosis: Generalized weakness;Acute pain   OT Problem List: Decreased strength;Decreased activity tolerance;Impaired balance (sitting and/or standing);Decreased safety awareness;Decreased knowledge of use of DME or AE;Decreased knowledge of precautions;Pain   OT  Treatment/Interventions: Self-care/ADL training;Therapeutic exercise;Energy conservation;DME and/or AE instruction;Therapeutic activities;Patient/family education;Balance training    OT Goals(Current goals can be found in the care plan section) Acute Rehab OT Goals Patient Stated Goal: to go to rehab so that I can be safe and independent OT Goal Formulation: With patient Time For Goal Achievement: 02/27/14 Potential to Achieve Goals: Good ADL Goals Pt Will Perform Lower Body Dressing: with min guard assist;sit to/from stand Pt Will Transfer to Toilet: stand pivot transfer;bedside commode;with supervision Pt Will Perform Toileting - Clothing Manipulation and hygiene: sit to/from stand;with supervision  OT Frequency: Min 2X/week   Barriers to D/C: Decreased caregiver support          Co-evaluation PT/OT/SLP Co-Evaluation/Treatment: Yes Reason for Co-Treatment: For patient/therapist safety   OT goals addressed during session: ADL's and self-care      End of Session Equipment Utilized During Treatment: Gait belt;Rolling walker;Other (comment) (CAM boot) Nurse Communication: Other (comment) (pt up in recliner)  Activity Tolerance: Patient tolerated treatment well Patient left: in chair;with call bell/phone within reach   Time: 0852-0920 OT Time Calculation (min): 28 min Charges:  OT General Charges $OT Visit: 1 Procedure OT Evaluation $Initial OT Evaluation Tier I: 1 Procedure G-Codes: OT G-codes **NOT FOR INPATIENT CLASS** Functional Assessment Tool Used: clinical judgement Functional Limitation: Self care Self Care Current Status (E4540(G8987): At least 20 percent but less than 40 percent impaired, limited or restricted Self Care Goal Status (J8119(G8988): At least 1 percent  but less than 20 percent impaired, limited or restricted  Rae Lips 02/13/2014, 9:45 AM  Carney Living, OTR/L Occupational Therapist [phone] (pager)

## 2014-02-13 NOTE — Progress Notes (Signed)
Ginger RN from day shift reported that pts insulin pump is no longer functional. Pt stated that she accidentally broke it and has no refills with her. Pt is unable to give own insulin at this time. BS @ 2040 was 132.  Notified Elodia FlorenceAndrew Nida PA on call for Dr. Turner Danielsowan. Received order for resistant sliding scale insulin beginning tomorrow morning. Will continue to monitor BS q 4 hours throughout the night.  Lowella DellHudson, Petr Bontempo G  02/13/2014 9:58 PM

## 2014-02-13 NOTE — Progress Notes (Signed)
Consult note                                            Patient Demographics  Karen Gardner, is a 58 y.o. female, DOB - 1955/08/24, WUJ:[name]:[id number]  Admit date - 02/11/2014   Admitting Physician Karen LewandowskyFrank J Rowan, MD  Outpatient Primary MD for the patient is Karen Gardner  LOS - 2   No chief complaint on file.       Subjective:   Karen CrutchMarian Gardner today has, No headache, No chest pain, No abdominal pain - No Nausea, No new weakness tingling or numbness, No Cough - SOB.    Assessment & Plan     hospitalist will sign off call with any questions    1. Mechanical injury with right ankle fracture, bimalleolar, closed - post ORIF, will defer management to primary team which is orthopedics.   2. GERD. On PPI continue.   3. CK D stage III. Baseline creatinine between 1.2 and 1.3. At baseline.   4. Diabetes mellitus type 2, uncontrolled - wears insulin pump, takes NovoLog with meals, she likely had high sugars as insulin pump was held prior to surgery.  Insulin pump resumed with good glycemic control. Continue insulin pump and sliding scale while here, upon discharge no new diabetic medications to be prescribed.  Lab Results  Component Value Date   HGBA1C 7.4* 02/11/2014    CBG (last 3)   Recent Labs  02/13/14 0017 02/13/14 0404 02/13/14 0825  GLUCAP 177* 124* 138*          Medications  Scheduled Meds: . docusate sodium  100 mg Oral BID  . escitalopram  10 mg Oral Daily  . insulin aspart  0-9 Units Subcutaneous TID WC  . insulin pump   Subcutaneous 6 times per day  . pantoprazole  20 mg Oral Daily   Continuous Infusions:  PRN Meds:.hydrALAZINE, HYDROcodone-acetaminophen, HYDROmorphone (DILAUDID) injection, methocarbamol **OR** [DISCONTINUED]  methocarbamol (ROBAXIN)  IV, [DISCONTINUED] ondansetron **OR** ondansetron (ZOFRAN) IV, oxyCODONE-acetaminophen  DVT Prophylaxis  Per primary team  Lab Results  Component Value Date   PLT 176 02/12/2014    Antibiotics     Anti-infectives    Start     Dose/Rate Route Frequency Ordered Stop   02/11/14 1204  ceFAZolin (ANCEF) 2-3 GM-% IVPB SOLR    CommentsMacon Gardner:  Karen Gardner   : cabinet override      02/11/14 1204 02/11/14 1515   02/11/14 1200  ceFAZolin (ANCEF) IVPB 2 g/50 mL premix  Status:  Discontinued     2 g100 mL/hr over 30 Minutes Intravenous On call to O.R. 02/11/14 1156 02/11/14 1758          Objective:   Filed Vitals:   02/12/14 1300 02/12/14 2112 02/13/14 0545 02/13/14 0806  BP: 132/68 145/74 138/69 124/64  Pulse: 103 103 100 105  Temp: 99.4 F (37.4 C) 99.4 F (37.4 C) 98.9 F (37.2 C) 98.3 F (36.8 C)  TempSrc:    Oral  Resp: 18 18 18    Height:  Weight:      SpO2: 96% 94% 93% 96%    Wt Readings from Last 3 Encounters:  02/11/14 104.129 kg (229 lb 9 oz)  01/28/14 104.146 kg (229 lb 9.6 oz)  01/21/14 104.599 kg (230 lb 9.6 oz)     Intake/Output Summary (Last 24 hours) at 02/13/14 0858 Last data filed at 02/13/14 0500  Gross per 24 hour  Intake    940 ml  Output      0 ml  Net    940 ml     Physical Exam  Awake Alert, Oriented X 3, No new F.N deficits, Normal affect [place].AT,PERRAL Supple Neck,No JVD, No cervical lymphadenopathy appriciated.  Symmetrical Chest wall movement, Good air movement bilaterally, CTAB RRR,No Gallops,Rubs or new Murmurs, No Parasternal Heave +ve B.Sounds, Abd Soft, No tenderness, No organomegaly appriciated, No rebound - guarding or rigidity. No Cyanosis, Clubbing or edema, No new Rash or bruise , R ankle in Boot and under bandage   Data Review   Micro Results No results found for this or any previous visit (from the past 240 hour(s)).  Radiology Reports Dg Ankle 2 Views Right  02/11/2014   CLINICAL DATA:   ORIF, right ankle fracture  EXAM: DG C-ARM 61-120 MIN; RIGHT ANKLE - 2 VIEW  TECHNIQUE: Two intraoperative views of the right ankle  CONTRAST:  None  FLUOROSCOPY TIME:  5 seconds  COMPARISON:  None  FINDINGS: Two views of the right ankle submitted. A metallic fixation plate and screws noted in distal right fibula. A metallic fixation screw is noted in distal tibia medial malleolus. Ankle mortise is preserved. There is anatomic alignment.  IMPRESSION: Metallic fixation plate and screws in distal fibula. Metallic fixation screw in distal right tibia medial malleolus. There is anatomic alignment. Ankle mortise is preserved.   Electronically Signed   By: Natasha Mead M.D.   On: 02/11/2014 16:44   Dg C-arm 1-60 Min  02/11/2014   CLINICAL DATA:  ORIF, right ankle fracture  EXAM: DG C-ARM 61-120 MIN; RIGHT ANKLE - 2 VIEW  TECHNIQUE: Two intraoperative views of the right ankle  CONTRAST:  None  FLUOROSCOPY TIME:  5 seconds  COMPARISON:  None  FINDINGS: Two views of the right ankle submitted. A metallic fixation plate and screws noted in distal right fibula. A metallic fixation screw is noted in distal tibia medial malleolus. Ankle mortise is preserved. There is anatomic alignment.  IMPRESSION: Metallic fixation plate and screws in distal fibula. Metallic fixation screw in distal right tibia medial malleolus. There is anatomic alignment. Ankle mortise is preserved.   Electronically Signed   By: Natasha Mead M.D.   On: 02/11/2014 16:44     CBC  Recent Labs Lab 02/11/14 1158 02/12/14 0510  WBC 4.1 5.7  HGB 10.3* 8.6*  HCT 32.8* 27.3*  PLT 174 176  MCV 91.9 91.9  MCH 28.9 29.0  MCHC 31.4 31.5  RDW 13.6 13.5  LYMPHSABS  --  0.9  MONOABS  --  0.8  EOSABS  --  0.0  BASOSABS  --  0.0    Chemistries   Recent Labs Lab 02/11/14 1158 02/11/14 2310 02/12/14 0510  NA 142 135* 141  K 3.7 4.7 4.2  CL 103 98 103  CO2 24 24 26   GLUCOSE 170* 544* 262*  BUN 34* 35* 33*  CREATININE 1.22* 1.30* 1.28*  CALCIUM  9.2 8.8 8.9  AST  --   --  18  ALT  --   --  14  ALKPHOS  --   --  74  BILITOT  --   --  0.3   ------------------------------------------------------------------------------------------------------------------ estimated creatinine clearance is 56.2 mL/min (by C-G formula based on Cr of 1.28). ------------------------------------------------------------------------------------------------------------------  Recent Labs  02/11/14 1853  HGBA1C 7.4*   ------------------------------------------------------------------------------------------------------------------ No results for input(s): CHOL, HDL, LDLCALC, TRIG, CHOLHDL, LDLDIRECT in the last 72 hours. ------------------------------------------------------------------------------------------------------------------ No results for input(s): TSH, T4TOTAL, T3FREE, THYROIDAB in the last 72 hours.  Invalid input(s): FREET3 ------------------------------------------------------------------------------------------------------------------ No results for input(s): VITAMINB12, FOLATE, FERRITIN, TIBC, IRON, RETICCTPCT in the last 72 hours.  Coagulation profile No results for input(s): INR, PROTIME in the last 168 hours.  No results for input(s): DDIMER in the last 72 hours.  Cardiac Enzymes No results for input(s): CKMB, TROPONINI, MYOGLOBIN in the last 168 hours.  Invalid input(s): CK ------------------------------------------------------------------------------------------------------------------ Invalid input(s): POCBNP     Time Spent in minutes 30   Emmer Lillibridge K M.D on 02/13/2014 at 8:58 AM  Between 7am to 7pm - Pager - [phone]  After 7pm go to [email or web address] - password [email or web address]  And look for the night coverage person covering for me after hours  [facility] Office  [phone]

## 2014-02-13 NOTE — Plan of Care (Signed)
Problem: Phase III Progression Outcomes Goal: Pain controlled on oral analgesia Outcome: Completed/Met Date Met:  02/13/14

## 2014-02-13 NOTE — Plan of Care (Signed)
Problem: Phase III Progression Outcomes Goal: Voiding independently Outcome: Completed/Met Date Met:  02/13/14 Goal: IV changed to normal saline lock Outcome: Completed/Met Date Met:  02/13/14 Goal: Nasogastric tube discontinued Outcome: Not Applicable Date Met:  02/13/14 Goal: Demonstrates TCDB, IS independently Outcome: Completed/Met Date Met:  02/13/14     

## 2014-02-13 NOTE — Progress Notes (Signed)
Called by patient, stated that she broke the insulin accidentally. I checked with patient again and that pump was not functional any more. Gave 2 units to cover instead of insulin pump at dinner time. Next shift reported and aware on it.

## 2014-02-14 DIAGNOSIS — S82841A Displaced bimalleolar fracture of right lower leg, initial encounter for closed fracture: Secondary | ICD-10-CM | POA: Diagnosis not present

## 2014-02-14 LAB — GLUCOSE, CAPILLARY
GLUCOSE-CAPILLARY: 146 mg/dL — AB (ref 70–99)
GLUCOSE-CAPILLARY: 271 mg/dL — AB (ref 70–99)
Glucose-Capillary: 182 mg/dL — ABNORMAL HIGH (ref 70–99)

## 2014-02-14 NOTE — Progress Notes (Signed)
Subjective: 3 Days Post-Op Procedure(s) (LRB): OPEN REDUCTION INTERNAL FIXATION (ORIF) RIGHT ANKLE FRACTURE (Right)  Activity level:  Touch Down Weight Bearing (TDWB Diet tolerance:  eatign well Voiding:  ok Patient reports pain as mild.    Objective: Vital signs in last 24 hours: Temp:  [98.8 F (37.1 C)-99.3 F (37.4 C)] 98.8 F (37.1 C) (11/14 0555) Pulse Rate:  [85-108] 85 (11/14 0555) Resp:  [20] 20 (11/14 0555) BP: (144-158)/(70-80) 144/80 mmHg (11/14 0555) SpO2:  [98 %] 98 % (11/14 0555)  Labs:  Recent Labs  02/11/14 1158 02/12/14 0510  HGB 10.3* 8.6*    Recent Labs  02/11/14 1158 02/12/14 0510  WBC 4.1 5.7  RBC 3.57* 2.97*  HCT 32.8* 27.3*  PLT 174 176    Recent Labs  02/11/14 2310 02/12/14 0510  NA 135* 141  K 4.7 4.2  CL 98 103  CO2 24 26  BUN 35* 33*  CREATININE 1.30* 1.28*  GLUCOSE 544* 262*  CALCIUM 8.8 8.9   No results for input(s): LABPT, INR in the last 72 hours.  Physical Exam:  Neurologically intact ABD soft Neurovascular intact Sensation intact distally Intact pulses distally Dorsiflexion/Plantar flexion intact Incision: dressing C/D/I No cellulitis present Compartment soft  Assessment/Plan:  3 Days Post-Op Procedure(s) (LRB): OPEN REDUCTION INTERNAL FIXATION (ORIF) RIGHT ANKLE FRACTURE (Right) Advance diet Up with therapy Discharge to SNF today if bed available. Patient states that her mother is bringing her another needle for her insulin pump today. ASA for DVT prevention. Follow up with Dr. Turner Danielsowan in office in a week or 2.    Karen Gardner, Ginger OrganNDREW PAUL 02/14/2014, 8:24 AM

## 2014-02-14 NOTE — Progress Notes (Signed)
Report called to Elmarie Shileyiffany, [facility] fundraiserN [facility]. IV removed with no complications. VS stable. [facility] with all personal belongings and discharge packet.

## 2014-02-14 NOTE — Clinical Social Work Note (Signed)
[facility]. Pt updated at bedside regarding discharge.  [facility]: [phone] Transportation: EMS ([facility]) scheduled for 1pm once bed available at facility.  Marcelline Deistmily Trenell Concannon, LCSWA [phone]) Licensed Clinical Social Worker Orthopedics [phone]) and Surgical [phone])

## 2014-02-17 ENCOUNTER — Non-Acute Institutional Stay (SKILLED_NURSING_FACILITY): Payer: 59 | Admitting: Registered Nurse

## 2014-02-17 DIAGNOSIS — F32A Depression, unspecified: Secondary | ICD-10-CM

## 2014-02-17 DIAGNOSIS — I10 Essential (primary) hypertension: Secondary | ICD-10-CM

## 2014-02-17 DIAGNOSIS — K219 Gastro-esophageal reflux disease without esophagitis: Secondary | ICD-10-CM

## 2014-02-17 DIAGNOSIS — F329 Major depressive disorder, single episode, unspecified: Secondary | ICD-10-CM

## 2014-02-17 DIAGNOSIS — D649 Anemia, unspecified: Secondary | ICD-10-CM

## 2014-02-17 DIAGNOSIS — E1140 Type 2 diabetes mellitus with diabetic neuropathy, unspecified: Secondary | ICD-10-CM

## 2014-02-17 DIAGNOSIS — S82841D Displaced bimalleolar fracture of right lower leg, subsequent encounter for closed fracture with routine healing: Secondary | ICD-10-CM

## 2014-02-17 NOTE — Progress Notes (Signed)
Patient ID: Karen Gardner, female   DOB: [birth year], 59 y.o.   MRN: [id number]   Place of Service: [facility]  Allergies  Allergen Reactions  . Lactose Intolerance (Gi)   . Oatmeal Other (See Comments)    "Gas"     Code Status: Full Code  Goals of Care: Longevity/STR  Chief Complaint  Patient presents with  . Hospitalization Follow-up    HPI 58 y.o. female with PMH of uncontrolled DM2, HTN, OA, GERD, depression among others is being seen for a hospital-up. She is here fore short term rehab after hospital admission from 02/11/14 to 02/13/14 for right closed bimalleolar ankle fracture s/p ORIF.  No complaints verbalized from patient. Resting comfortably in her bed.   Review of Systems Constitutional: Negative for fever, chills, and fatigue. HENT: Negative for facial swelling, ear pain, congestion, and sore throat Eyes: Negative for eye pain, eye discharge, and visual disturbance  Cardiovascular: Negative for chest pain, palpitations. Positive for swelling in RLE Respiratory: Negative cough, shortness of breath, and wheezing.  Gastrointestinal: Negative for nausea and vomiting. Negative for abdominal pain, diarrhea and constipation.  Genitourinary: Negative for dysuria Musculoskeletal: Positive for pain in RLE.  Neurological: Negative for   Skin: Negative for rash.   Psychiatric: Negative for depression  Past Medical History  Diagnosis Date  . Hypertension   . Arthritis   . Anemia     has had to have iron infusions-sees dr Jonette Eva  . GERD (gastroesophageal reflux disease)   . Numbness in both hands     mostly at night  . Anxiety   . Diabetes mellitus without complication     Type 2 on insulin pump    Past Surgical History  Procedure Laterality Date  . Hernia repair      umb hernia as child  . Colonoscopy    . Upper gi endoscopy    . Knee arthroscopy  02/12/2012    Procedure: ARTHROSCOPY KNEE;  Surgeon: Kerin Salen, MD;  Location: [place];  Service: Orthopedics;  Laterality: Right;  Partial Lateral Meniscectomy, Debridement chondromalacia  . Orif ankle fracture Right 02/11/2014    Procedure: OPEN REDUCTION INTERNAL FIXATION (ORIF) RIGHT ANKLE FRACTURE;  Surgeon: Kerin Salen, MD;  Location: [place];  Service: Orthopedics;  Laterality: Right;    History   Social History  . Marital Status: Single    Spouse Name: N/A    Number of Children: N/A  . Years of Education: N/A   Occupational History  . Not on file.   Social History Main Topics  . Smoking status: Former Smoker    Quit date: 02/08/1972  . Smokeless tobacco: Not on file  . Alcohol Use: No  . Drug Use: No  . Sexual Activity: Not on file   Other Topics Concern  . Not on file   Social History Narrative      Medication List       This list is accurate as of: 02/17/14 11:36 PM.  Always use your most recent med list.               aspirin EC 325 MG tablet  Take 1 tablet (325 mg total) by mouth 2 (two) times daily.     escitalopram 10 MG tablet  Commonly known as:  LEXAPRO  Take 10 mg by mouth daily.     Exenatide ER 2 MG Pen  Inject into the skin every Saturday.     glucose blood  test strip  Commonly known as:  ONETOUCH VERIO  Use as instructed to check blood sugar once a day dx code E11.65     ibuprofen 200 MG tablet  Commonly known as:  ADVIL,MOTRIN  Take 400 mg by mouth every 6 (six) hours as needed for mild pain.     insulin aspart 100 UNIT/ML injection  Commonly known as:  NOVOLOG  Use max 56 units per day with V-Go     lansoprazole 30 MG capsule  Commonly known as:  PREVACID  Take 30 mg by mouth daily.     methocarbamol 500 MG tablet  Commonly known as:  ROBAXIN  Take 1 tablet (500 mg total) by mouth 2 (two) times daily with a meal.     ONETOUCH DELICA LANCETS FINE Misc  Use to check blood sugar once a day dx code E11.65     oxyCODONE-acetaminophen 10-325 MG per tablet  Commonly known as:  PERCOCET  Take  0.5-1 tablets by mouth every 6 (six) hours as needed for pain.     V-GO 20 Kit  Use one per day        Physical Exam Filed Vitals:   02/17/14 2334  BP: 121/78  Pulse: 84  Temp: 97.1 F (36.2 C)  Resp: 20   Constitutional: WDWN elderly female in no acute distress. Conversant and pleasant HEENT: Normocephalic and atraumatic. PERRL. EOM intact. No icterus. Oral mucosa moist. Posterior pharynx clear of any exudate or lesions. Teeth and gingiva in good general condition.  Neck: Supple and nontender. No lymphadenopathy, masses, or thyromegaly. No JVD or carotid bruits. Cardiac: Normal S1, S2. RRR without appreciable murmurs, rubs, or gallops. Left distal pulses intact. Good cap refill of RLE with edema noted.   Lungs: No respiratory distress. Breath sounds clear bilaterally without rales, rhonchi, or wheezes. Abdomen: Audible bowel sounds in all quadrants. Soft, nontender, nondistended.  Musculoskeletal: Able to move all extremities. RLE immobilizer in place.  Skin: Warm and dry. No rash noted. No erythema.  Neurological: Alert and oriented to person, place, and time. No focal deficits.  Psychiatric: Judgment and insight adequate. Appropriate mood and affect.   Labs Reviewed CBC Latest Ref Rng 02/12/2014 02/11/2014 02/12/2012  WBC 4.0 - 10.5 K/uL 5.7 4.1 -  Hemoglobin 12.0 - 15.0 g/dL 8.6(L) 10.3(L) 12.2  Hematocrit 36.0 - 46.0 % 27.3(L) 32.8(L) -  Platelets 150 - 400 K/uL 176 174 -    CMP     Component Value Date/Time   NA 141 02/12/2014 0510   K 4.2 02/12/2014 0510   CL 103 02/12/2014 0510   CO2 26 02/12/2014 0510   GLUCOSE 262* 02/12/2014 0510   BUN 33* 02/12/2014 0510   CREATININE 1.28* 02/12/2014 0510   CALCIUM 8.9 02/12/2014 0510   PROT 6.6 02/12/2014 0510   ALBUMIN 2.8* 02/12/2014 0510   AST 18 02/12/2014 0510   ALT 14 02/12/2014 0510   ALKPHOS 74 02/12/2014 0510   BILITOT 0.3 02/12/2014 0510   GFRNONAA 45* 02/12/2014 0510   GFRAA 52* 02/12/2014 0510    Lab  Results  Component Value Date   HGBA1C 7.4* 02/11/2014    Diagnostic Studies Reviewed 02/11/14 DG C-ARM 61-120 MIN; RIGHT ANKLE - 2 VIEW  FINDINGS: Two views of the right ankle submitted. A metallic fixation plate and screws noted in distal right fibula. A metallic fixation screw is noted in distal tibia medial malleolus. Ankle mortise is preserved. There is anatomic alignment.  IMPRESSION: Metallic fixation plate and screws in distal  fibula. Metallic fixation screw in distal right tibia medial malleolus. There is anatomic alignment. Ankle mortise is preserved.  Assessment & Plan 1. Essential hypertension, benign Stable. Continue valsartan/hctz 80/12.39m daily and cardiac diet. Continue to monitor.   2. Ankle fracture, bimalleolar, closed, right, with routine healing, subsequent encounter Stable. Will work with PT/OT for strength/balance/gait training and ADLs care. Pain is adequately controlled with current regimen. Continue Percocet 10/3240m1-2 tabs Q4H PRN for moderate to severe pain, robaxin 50064mID, and ibuprofen 400m30mH PRN mild pain. Continue asa 325mg53m for DVT prophylaxis. F/u with Dr. RowanMayer CamelnkPilar Plate0 days. Continue to monitor  3. Type 2 diabetes mellitus with diabetic neuropathy Last A1c is 7.4. CBGs range from 80-91s. Continue exenatide ER 2mg e29my saturday and novolog max 56 units daily via self-admin insulin pump. Continue to monitor for now.   4. Depression Stable. Continue lexapro 10mg d47m and monitor for change in mood.  5. Gastroesophageal reflux disease without esophagitis Stable. Continue prevacid 30mg da28mand montior.   6. Anemia S/p post op. She also has had to have iron infusions-sees Dr. Enever. Jonette Evaecheck CBC and continue to monitor.   Labs Ordered: CBC  Time spent with patient: more than 50 minutes . Family/Staff Communication Plan of care discuss with resident and professional staff members. Resident and professional staff  members verbalize understanding and agree with plan of care. No additional questions or concerns reported.    Mckinnley Smithey NguyArthur HolmsGNP-C [facility] ([phone]] After hours: [phone]

## 2014-02-18 ENCOUNTER — Ambulatory Visit: Payer: 59 | Admitting: Endocrinology

## 2014-02-18 ENCOUNTER — Encounter: Payer: Self-pay | Admitting: Registered Nurse

## 2014-02-19 ENCOUNTER — Other Ambulatory Visit: Payer: 59

## 2014-02-19 LAB — CBC AND DIFFERENTIAL
HEMATOCRIT: 28 % — AB (ref 36–46)
HEMOGLOBIN: 8.5 g/dL — AB (ref 12.0–16.0)
Platelets: 288 10*3/uL (ref 150–399)
WBC: 5.2 10^3/mL

## 2014-02-20 ENCOUNTER — Encounter: Payer: Self-pay | Admitting: Internal Medicine

## 2014-02-20 ENCOUNTER — Other Ambulatory Visit: Payer: Self-pay | Admitting: *Deleted

## 2014-02-20 ENCOUNTER — Non-Acute Institutional Stay (SKILLED_NURSING_FACILITY): Payer: 59 | Admitting: Internal Medicine

## 2014-02-20 DIAGNOSIS — I10 Essential (primary) hypertension: Secondary | ICD-10-CM

## 2014-02-20 DIAGNOSIS — D62 Acute posthemorrhagic anemia: Secondary | ICD-10-CM

## 2014-02-20 DIAGNOSIS — K219 Gastro-esophageal reflux disease without esophagitis: Secondary | ICD-10-CM

## 2014-02-20 DIAGNOSIS — F329 Major depressive disorder, single episode, unspecified: Secondary | ICD-10-CM

## 2014-02-20 DIAGNOSIS — K5900 Constipation, unspecified: Secondary | ICD-10-CM

## 2014-02-20 DIAGNOSIS — F32A Depression, unspecified: Secondary | ICD-10-CM

## 2014-02-20 DIAGNOSIS — S82841D Displaced bimalleolar fracture of right lower leg, subsequent encounter for closed fracture with routine healing: Secondary | ICD-10-CM

## 2014-02-20 DIAGNOSIS — IMO0002 Reserved for concepts with insufficient information to code with codable children: Secondary | ICD-10-CM

## 2014-02-20 DIAGNOSIS — E1165 Type 2 diabetes mellitus with hyperglycemia: Secondary | ICD-10-CM

## 2014-02-20 MED ORDER — OXYCODONE HCL ER 10 MG PO T12A: EXTENDED_RELEASE_TABLET | ORAL | Status: DC

## 2014-02-20 NOTE — Progress Notes (Signed)
Patient ID: Karen Gardner, female   DOB: 1956/03/28, 58 y.o.   MRN: [id number]     Facility: [facility]     PCP: Leeroy Cha   Allergies  Allergen Reactions  . Lactose Intolerance (Gi)   . Oatmeal Other (See Comments)    "Gas"     Chief Complaint  Patient presents with  . New Admit To SNF     HPI:  59 y.o. female patient is here for STR post hospital admission from 02/11/14 to 02/13/14 for right closed bimalleolar ankle fracture s/p ORIF. She has PMH of DM2, obesity, HTN, OA, GERD, depression. She is seen in her room today with her daughter at bedside. She mentions that her ankle pain is 8-9/10 and pain and muscle spasm has been interfering with her sleep. Last bowel movement was 2 days back and she mentions having bowel movement everyday at home. no other concerns.    Review of Systems:  Constitutional: Negative for fever, chills.  HENT: Negative for congestion Respiratory: Negative for cough, sputum production, shortness of breath and wheezing.   Cardiovascular: Negative for chest pain, palpitations, orthopnea. Has right leg swelling.  Gastrointestinal: Negative for heartburn, nausea, vomiting, abdominal pain Genitourinary: Negative for dysuria Musculoskeletal: Negative for falls Skin: Negative for itching, rash.  Neurological: Negative for weakness,dizziness, tingling, headaches.  Psychiatric/Behavioral: Negative for depression  Past Medical History  Diagnosis Date  . Hypertension   . Arthritis   . Anemia     has had to have iron infusions-sees dr Jonette Eva  . GERD (gastroesophageal reflux disease)   . Numbness in both hands     mostly at night  . Anxiety   . Diabetes mellitus without complication     Type 2 on insulin pump   Past Surgical History  Procedure Laterality Date  . Hernia repair      umb hernia as child  . Colonoscopy    . Upper gi endoscopy    . Knee arthroscopy  02/12/2012    Procedure: ARTHROSCOPY KNEE;   Surgeon: Kerin Salen, MD;  Location: [place];  Service: Orthopedics;  Laterality: Right;  Partial Lateral Meniscectomy, Debridement chondromalacia  . Orif ankle fracture Right 02/11/2014    Procedure: OPEN REDUCTION INTERNAL FIXATION (ORIF) RIGHT ANKLE FRACTURE;  Surgeon: Kerin Salen, MD;  Location: [place];  Service: Orthopedics;  Laterality: Right;   Social History:   reports that she quit smoking about 42 years ago. She does not have any smokeless tobacco history on file. She reports that she does not drink alcohol or use illicit drugs.  History reviewed. No pertinent family history.  Medications: Patient's Medications  New Prescriptions   No medications on file  Previous Medications   ASPIRIN EC 325 MG TABLET    Take 1 tablet (325 mg total) by mouth 2 (two) times daily.   ESCITALOPRAM (LEXAPRO) 10 MG TABLET    Take 10 mg by mouth daily.   EXENATIDE ER 2 MG PEN    Inject into the skin every Saturday.    GLUCOSE BLOOD (ONETOUCH VERIO) TEST STRIP    Use as instructed to check blood sugar once a day dx code E11.65   IBUPROFEN (ADVIL,MOTRIN) 200 MG TABLET    Take 400 mg by mouth every 6 (six) hours as needed for mild pain.   INSULIN ASPART (NOVOLOG) 100 UNIT/ML INJECTION    Use max 56 units per day with V-Go   INSULIN DISPOSABLE PUMP (V-GO 20) KIT  Use one per day   LANSOPRAZOLE (PREVACID) 30 MG CAPSULE    Take 30 mg by mouth daily.   METHOCARBAMOL (ROBAXIN) 500 MG TABLET    Take 1 tablet (500 mg total) by mouth 2 (two) times daily with a meal.   ONETOUCH DELICA LANCETS FINE MISC    Use to check blood sugar once a day dx code E11.65   OXYCODONE-ACETAMINOPHEN (PERCOCET) 10-325 MG PER TABLET    Take 0.5-1 tablets by mouth every 6 (six) hours as needed for pain.   VALSARTAN-HYDROCHLOROTHIAZIDE (DIOVAN-HCT) 80-12.5 MG PER TABLET    Take 1 tablet by mouth daily.  Modified Medications   No medications on file  Discontinued Medications   No medications on file      Physical Exam: Filed Vitals:   02/20/14 1118  BP: 134/82  Pulse: 74  Temp: 98.5 F (36.9 C)  Resp: 18  SpO2: 94%    General- elderly female in no acute distress, obese Head- atraumatic, normocephalic Eyes- PERRLA, EOMI, no pallor, no icterus, no discharge Neck- no cervical lymphadenopathy Throat- moist mucus membrane Cardiovascular- normal s1,s2, no murmurs/ rubs/ gallops, dorsalis pedis intact, right leg edema Respiratory- bilateral clear to auscultation, no wheeze, no rhonchi, no crackles, no use of accessory muscles Abdomen- bowel sounds present, soft, non tender Musculoskeletal- able to move all 4 extremities, right ankle brace present  Neurological- no focal deficit Skin- warm and dry Psychiatry- alert and oriented to person, place and time, normal mood and affect    Labs reviewed: Basic Metabolic Panel:  Recent Labs  02/11/14 1158 02/11/14 2310 02/12/14 0510  NA 142 135* 141  K 3.7 4.7 4.2  CL 103 98 103  CO2 _0 GLUCOSE 170* 544* 262*  BUN 34* 35* 33*  CREATININE 1.22* 1.30* 1.28*  CALCIUM 9.2 8.8 8.9   Liver Function Tests:  Recent Labs  02/12/14 0510  AST 18  ALT 14  ALKPHOS 74  BILITOT 0.3  PROT 6.6  ALBUMIN 2.8*   No results for input(s): LIPASE, AMYLASE in the last 8760 hours. No results for input(s): AMMONIA in the last 8760 hours. CBC:  Recent Labs  02/11/14 1158 02/12/14 0510  WBC 4.1 5.7  NEUTROABS  --  3.9  HGB 10.3* 8.6*  HCT 32.8* 27.3*  MCV 91.9 91.9  PLT 174 176   CBG:  Recent Labs  02/14/14 0023 02/14/14 0554 02/14/14 1215  GLUCAP 182* 146* 271*    Radiological Exams: 02/11/14 DG C-ARM 61-120 MIN; RIGHT ANKLE - 2 VIEW   FINDINGS: Two views of the right ankle submitted. A metallic fixation plate and screws noted in distal right fibula. A metallic fixation screw is noted in distal tibia medial malleolus. Ankle mortise is preserved. There is anatomic alignment.   IMPRESSION: Metallic fixation  plate and screws in distal fibula. Metallic fixation screw in distal right tibia medial malleolus. There is anatomic alignment. Ankle mortise is preserved.   Assessment/Plan  Ankle fracture S/p ORIF. Continue to work with PT and OT. She is touch toe weight bearing only on right leg. Continue skin care. Change robaxin to 500 mg tid and continue percocet current regimen of 10-325 1-2 tab q4h prn. Add oxycodone ER 10 mg bid and reassess. Has follow up with dr Mayer Camel from ortho  Essential hypertension, benign Stable. Continue losartan-hctz 50-12.5 mg daily.   Constipation On senna s 1 tab bid. With pt going up on narcotics, will add miralax 17 g daily and reassess  Anemia Likely  post op. Monitor cbc  Type 2 diabetes mellitus with diabetic neuropathy Last A1c is 7.4.  Continue exenatide ER 35m every saturday and her insulin pump. Monitor cbg  gerd Stable on prevacid  Depression Continue lexapro 140mdaily and monitor      Goals of care: short term rehabilitation   Labs/tests ordered- cbc    MABlanchie ServeMD  [facility] [phone]-Friday 8 am - 5 pm) [phone])

## 2014-02-20 NOTE — Telephone Encounter (Signed)
[facility] [phone]

## 2014-02-24 ENCOUNTER — Other Ambulatory Visit: Payer: Self-pay

## 2014-02-24 DIAGNOSIS — S82891A Other fracture of right lower leg, initial encounter for closed fracture: Secondary | ICD-10-CM

## 2014-02-24 MED ORDER — OXYCODONE-ACETAMINOPHEN 10-325 MG PO TABS: 0.5000 | ORAL_TABLET | Freq: Four times a day (QID) | ORAL | Status: DC | PRN

## 2014-02-24 NOTE — Telephone Encounter (Signed)
Rx faxed to [facility] @ [phone], phone number [phone]  

## 2014-03-09 ENCOUNTER — Non-Acute Institutional Stay (SKILLED_NURSING_FACILITY): Payer: 59 | Admitting: Registered Nurse

## 2014-03-09 DIAGNOSIS — S82841D Displaced bimalleolar fracture of right lower leg, subsequent encounter for closed fracture with routine healing: Secondary | ICD-10-CM

## 2014-03-09 DIAGNOSIS — I10 Essential (primary) hypertension: Secondary | ICD-10-CM

## 2014-03-09 DIAGNOSIS — E1165 Type 2 diabetes mellitus with hyperglycemia: Secondary | ICD-10-CM

## 2014-03-09 DIAGNOSIS — IMO0002 Reserved for concepts with insufficient information to code with codable children: Secondary | ICD-10-CM

## 2014-03-09 DIAGNOSIS — K219 Gastro-esophageal reflux disease without esophagitis: Secondary | ICD-10-CM

## 2014-03-09 DIAGNOSIS — F32A Depression, unspecified: Secondary | ICD-10-CM

## 2014-03-09 DIAGNOSIS — K5900 Constipation, unspecified: Secondary | ICD-10-CM

## 2014-03-09 DIAGNOSIS — F329 Major depressive disorder, single episode, unspecified: Secondary | ICD-10-CM

## 2014-03-09 NOTE — Progress Notes (Signed)
Patient ID: Karen Gardner, female   DOB: 1955/12/24, 58 y.o.   MRN: [id number]   Place of Service: [facility]  Allergies  Allergen Reactions  . Lactose Intolerance (Gi)   . Oatmeal Other (See Comments)    "Gas"     Code Status: Full Code  Goals of Care: Longevity/STR  Chief Complaint  Patient presents with  . Discharge Note    HPI 58 y.o. female with PMH of HTN, depression, uncontrolled DM2 on self-managed insulin pump, GERD among others is being seen for a discharge visit. Patient is here for short-term rehabilitation post hospital admission from 02/11/14 to 02/13/14 for Right closed malleolar fracture s/p ORIF. Patient has worked with therapy team and is ready to be discharged home with [facility] PT/OT, [place] aide, and DME (3-1 and WC). No complaints verbalized this visit except for wanting something for her allergy. Reported taking claritin at home with good relief.   Review of Systems Constitutional: Negative for fever, chills, and fatigue. HENT: Negative for ear pain. Positive for congestion. Eyes: Negative for eye pain, eye discharge, and visual disturbance  Cardiovascular: Negative for chest pain, palpitations. Positive for leg edema and discoloration of RLE Respiratory: Negative cough, shortness of breath, and wheezing.  Gastrointestinal: Negative for nausea and vomiting. Negative for abdominal pain, diarrhea and constipation.  Genitourinary: Negative for  dysuria, frequency, urgency, and hematuria Musculoskeletal: Negative for back pain. Positive for Right ankle pain Neurological: Negative for dizziness, headache Skin: Negative for rash and pruritus Psychiatric: Negative for depression  Past Medical History  Diagnosis Date  . Hypertension   . Arthritis   . Anemia     has had to have iron infusions-sees dr Jonette Eva  . GERD (gastroesophageal reflux disease)   . Numbness in both hands     mostly at night  . Anxiety   . Diabetes mellitus without complication    Type 2 on insulin pump    Past Surgical History  Procedure Laterality Date  . Hernia repair      umb hernia as child  . Colonoscopy    . Upper gi endoscopy    . Knee arthroscopy  02/12/2012    Procedure: ARTHROSCOPY KNEE;  Surgeon: Kerin Salen, MD;  Location: [place];  Service: Orthopedics;  Laterality: Right;  Partial Lateral Meniscectomy, Debridement chondromalacia  . Orif ankle fracture Right 02/11/2014    Procedure: OPEN REDUCTION INTERNAL FIXATION (ORIF) RIGHT ANKLE FRACTURE;  Surgeon: Kerin Salen, MD;  Location: [place];  Service: Orthopedics;  Laterality: Right;    History   Social History  . Marital Status: Single    Spouse Name: N/A    Number of Children: N/A  . Years of Education: N/A   Occupational History  . Not on file.   Social History Main Topics  . Smoking status: Former Smoker    Quit date: 02/08/1972  . Smokeless tobacco: Not on file  . Alcohol Use: No  . Drug Use: No  . Sexual Activity: Not on file   Other Topics Concern  . Not on file   Social History Narrative    No family history on file.    Medication List       This list is accurate as of: 03/09/14  4:21 PM.  Always use your most recent med list.               aspirin EC 325 MG tablet  Take 1 tablet (325 mg total) by mouth  2 (two) times daily.     escitalopram 10 MG tablet  Commonly known as:  LEXAPRO  Take 10 mg by mouth daily.     Exenatide ER 2 MG Pen  Inject into the skin every Saturday.     glucose blood test strip  Commonly known as:  ONETOUCH VERIO  Use as instructed to check blood sugar once a day dx code E11.65     ibuprofen 200 MG tablet  Commonly known as:  ADVIL,MOTRIN  Take 400 mg by mouth every 6 (six) hours as needed for mild pain.     insulin aspart 100 UNIT/ML injection  Commonly known as:  NOVOLOG  Use max 56 units per day with V-Go     lansoprazole 30 MG capsule  Commonly known as:  PREVACID  Take 30 mg by mouth daily.      loratadine 10 MG tablet  Commonly known as:  CLARITIN  Take 10 mg by mouth daily.     methocarbamol 500 MG tablet  Commonly known as:  ROBAXIN  Take 1 tablet (500 mg total) by mouth 2 (two) times daily with a meal.     ONETOUCH DELICA LANCETS FINE Misc  Use to check blood sugar once a day dx code E11.65     OxyCODONE 10 mg T12a 12 hr tablet  Commonly known as:  OXYCONTIN  TAKE 1 TABLET BY MOUTH TWICE DAILY FOR ANKLE FRACTURE S/P ORIF **DO NOT CRUSH**     oxyCODONE-acetaminophen 10-325 MG per tablet  Commonly known as:  PERCOCET  Take 0.5-1 tablets by mouth every 6 (six) hours as needed for pain. DO NOT EXCEED 4 GM OF TYLENOL IN 24 HOURS     polyethylene glycol packet  Commonly known as:  MIRALAX / GLYCOLAX  Take 17 g by mouth daily.     sennosides-docusate sodium 8.6-50 MG tablet  Commonly known as:  SENOKOT-S  Take 1 tablet by mouth 2 (two) times daily.     V-GO 20 Kit  Use one per day     valsartan-hydrochlorothiazide 80-12.5 MG per tablet  Commonly known as:  DIOVAN-HCT  Take 1 tablet by mouth daily.        Physical Exam  BP 120/60 mmHg  Pulse 70  Temp(Src) 97.1 F (36.2 C)  Resp 20  Ht 5' 5"  (1.651 m)  Wt 243 lb 12.8 oz (110.587 kg)  BMI 40.57 kg/m2  Constitutional: WDWN adult female in no acute distress. Conversant and pleasant HEENT: Normocephalic and atraumatic. PERRL. EOM intact. No icterus. No nasal discharge or sinus tenderness. Oral mucosa moist. Posterior pharynx clear of any exudate or lesions.  Neck: Supple and nontender. Bilateral submandibular lymphadenopathy, nontender to palpation. No JVD or carotid bruits. Cardiac: Normal S1, S2. RRR without appreciable murmurs, rubs, or gallops. Distal pulses intact. Trace pitting edema of LLE Lungs: No respiratory distress. Breath sounds clear bilaterally without rales, rhonchi, or wheezes. Abdomen: Audible bowel sounds in all quadrants. Soft, nontender, nondistended. No palpable mass.  Musculoskeletal: able  to move all extremities. RLE tender to palpation  Skin: Warm and dry. No rash noted. Discoloration of RLE noted-skin very dry and flaky Neurological: Alert and oriented to person, place, and time. No focal deficits.  Psychiatric: Judgment and insight adequate. Appropriate mood and affect.   Labs Reviewed  CBC Latest Ref Rng 02/19/2014 02/12/2014 02/11/2014  WBC - 5.2 5.7 4.1  Hemoglobin 12.0 - 16.0 g/dL 8.5(A) 8.6(L) 10.3(L)  Hematocrit 36 - 46 % 28(A) 27.3(L) 32.8(L)  Platelets  150 - 399 K/L 288 176 174    CMP Latest Ref Rng 02/12/2014 02/11/2014 02/11/2014  Glucose 70 - 99 mg/dL 262(H) 544(H) 170(H)  BUN 6 - 23 mg/dL 33(H) 35(H) 34(H)  Creatinine 0.50 - 1.10 mg/dL 1.28(H) 1.30(H) 1.22(H)  Sodium 137 - 147 mEq/L 141 135(L) 142  Potassium 3.7 - 5.3 mEq/L 4.2 4.7 3.7  Chloride 96 - 112 mEq/L 103 98 103  CO2 19 - 32 mEq/L 26 24 24   Calcium 8.4 - 10.5 mg/dL 8.9 8.8 9.2  Total Protein 6.0 - 8.3 g/dL 6.6 - -  Total Bilirubin 0.3 - 1.2 mg/dL 0.3 - -  Alkaline Phos 39 - 117 U/L 74 - -  AST 0 - 37 U/L 18 - -  ALT 0 - 35 U/L 14 - -   Lab Results  Component Value Date   HGBA1C 7.4* 02/11/2014    Diagnostic Studies Reviewed 02/11/14 DG C-ARM 61-120 MIN; RIGHT ANKLE - 2 VIEW  FINDINGS:Two views of the right ankle submitted. A metallic fixation plate and screws noted in distal right fibula. A metallic fixation screw is noted in distal tibia medial malleolus. Ankle mortise is preserved. There is anatomic alignment.  IMPRESSION: Metallic fixation plate and screws in distal fibula. Metallic fixation screw in distal right tibia medial malleolus. There isanatomic alignment. Ankle mortise is preserved.  Assessment & Plan 1. Essential hypertension, benign Stable. Continue valsartan-hctz 80/12.25m daily.   2. Depression Stable. Continue lexapro 1550mdaily.   3. Diabetes mellitus type 2, uncontrolled CBG range 56-109. Continue bydureon injection 50m350mvery Saturday and insulin pump.  F/u with pcp  4. Ankle fracture, bimalleolar, closed, right, with routine healing, subsequent encounter S/p ORIF. Continue oxycondone er 39m57mice daily and percocet 10/325mg79mo 2 tabs every four hours as needed for pain. Continue robaxin 500mg 5me time daily for muscle spasms. Continue to work with HH PT/[facility] for gait/balance/strength training.   5. Constipation, unspecified constipation type Continue senna 1 tab BID and miralax 17g daily.   6. Gastroesophageal reflux disease without esophagitis Continue prevacid 30mg d20m   7. Allergic rhinitis Started claritin 39mg da36m   Home health services:[place]/OT HH [place]: WC, 3-1 PCP follow-up: Dr. Rupa VarPerrin Smack1/15 @ 9:45am 30-day supply of prescription medications provided (#30 of oxy ER, #60 Percocet 10/325mg)  T18mspent: more than 30 min on counseling and care coordination   Family/Staff Communication Plan of care discussed with patient and nursing staff. Patient and nursing staff verbalized understanding and agree with plan of care. No additional questions or concerns reported.    Khaliyah Northrop NguyeArthur HolmsNP-C [facility] ([phone]] After hours: [phone]

## 2014-03-12 DIAGNOSIS — E1165 Type 2 diabetes mellitus with hyperglycemia: Secondary | ICD-10-CM

## 2014-03-12 DIAGNOSIS — S82844D Nondisplaced bimalleolar fracture of right lower leg, subsequent encounter for closed fracture with routine healing: Secondary | ICD-10-CM

## 2014-03-12 DIAGNOSIS — F329 Major depressive disorder, single episode, unspecified: Secondary | ICD-10-CM

## 2014-03-12 DIAGNOSIS — Z794 Long term (current) use of insulin: Secondary | ICD-10-CM

## 2014-03-12 DIAGNOSIS — Z7982 Long term (current) use of aspirin: Secondary | ICD-10-CM

## 2014-06-20 ENCOUNTER — Other Ambulatory Visit: Payer: Self-pay | Admitting: Endocrinology

## 2014-07-29 ENCOUNTER — Other Ambulatory Visit: Payer: Self-pay | Admitting: Orthopedic Surgery

## 2014-07-31 ENCOUNTER — Other Ambulatory Visit (HOSPITAL_COMMUNITY): Payer: Self-pay

## 2014-08-03 ENCOUNTER — Encounter (HOSPITAL_COMMUNITY): Payer: Self-pay

## 2014-08-03 ENCOUNTER — Encounter (HOSPITAL_COMMUNITY)
Admission: RE | Admit: 2014-08-03 | Discharge: 2014-08-03 | Disposition: A | Discharge status: Home / Self Care | Payer: 59 | Source: Ambulatory Visit | Attending: Orthopedic Surgery | Admitting: Orthopedic Surgery

## 2014-08-03 HISTORY — DX: Chronic kidney disease, unspecified: N18.9

## 2014-08-03 HISTORY — DX: Reserved for inherently not codable concepts without codable children: IMO0001

## 2014-08-03 HISTORY — DX: Iron deficiency anemia, unspecified: D50.9

## 2014-08-03 LAB — CBC WITH DIFFERENTIAL/PLATELET
Basophils Absolute: 0 10*3/uL (ref 0.0–0.1)
Basophils Relative: 0 % (ref 0–1)
EOS PCT: 4 % (ref 0–5)
Eosinophils Absolute: 0.2 10*3/uL (ref 0.0–0.7)
HCT: 32 % — ABNORMAL LOW (ref 36.0–46.0)
Hemoglobin: 9.8 g/dL — ABNORMAL LOW (ref 12.0–15.0)
Lymphocytes Relative: 24 % (ref 12–46)
Lymphs Abs: 1.2 10*3/uL (ref 0.7–4.0)
MCH: 27.6 pg (ref 26.0–34.0)
MCHC: 30.6 g/dL (ref 30.0–36.0)
MCV: 90.1 fL (ref 78.0–100.0)
MONO ABS: 0.4 10*3/uL (ref 0.1–1.0)
Monocytes Relative: 8 % (ref 3–12)
NEUTROS ABS: 3.3 10*3/uL (ref 1.7–7.7)
Neutrophils Relative %: 64 % (ref 43–77)
PLATELETS: 165 10*3/uL (ref 150–400)
RBC: 3.55 MIL/uL — ABNORMAL LOW (ref 3.87–5.11)
RDW: 14.2 % (ref 11.5–15.5)
WBC: 5.1 10*3/uL (ref 4.0–10.5)

## 2014-08-03 LAB — BASIC METABOLIC PANEL
Anion gap: 5 (ref 5–15)
BUN: 35 mg/dL — AB (ref 6–20)
CALCIUM: 8.7 mg/dL — AB (ref 8.9–10.3)
CHLORIDE: 109 mmol/L (ref 101–111)
CO2: 25 mmol/L (ref 22–32)
Creatinine, Ser: 1.6 mg/dL — ABNORMAL HIGH (ref 0.44–1.00)
GFR calc Af Amer: 39 mL/min — ABNORMAL LOW (ref >60)
GFR calc non Af Amer: 34 mL/min — ABNORMAL LOW (ref >60)
Glucose, Bld: 202 mg/dL — ABNORMAL HIGH (ref 70–99)
POTASSIUM: 4.1 mmol/L (ref 3.5–5.1)
Sodium: 139 mmol/L (ref 135–145)

## 2014-08-03 LAB — URINALYSIS, ROUTINE W REFLEX MICROSCOPIC
Bilirubin Urine: NEGATIVE
GLUCOSE, UA: NEGATIVE mg/dL
HGB URINE DIPSTICK: NEGATIVE
Ketones, ur: NEGATIVE mg/dL
Leukocytes, UA: NEGATIVE
Nitrite: NEGATIVE
Protein, ur: NEGATIVE mg/dL
SPECIFIC GRAVITY, URINE: 1.025 (ref 1.005–1.030)
Urobilinogen, UA: 0.2 mg/dL (ref 0.0–1.0)
pH: 5 (ref 5.0–8.0)

## 2014-08-03 LAB — APTT: APTT: 30 s (ref 24–37)

## 2014-08-03 LAB — SURGICAL PCR SCREEN
MRSA, PCR: NEGATIVE
Staphylococcus aureus: NEGATIVE

## 2014-08-03 LAB — PROTIME-INR
INR: 1.03 (ref 0.00–1.49)
Prothrombin Time: 13.6 seconds (ref 11.6–15.2)

## 2014-08-03 NOTE — Progress Notes (Signed)
Karen Gardner reported that her last A1 C was 6.0. Patient stated that she has changed endocrinologist and she has only seen new one 1 time. (Dr Sharl MaKerr with [facility]).  Karen Gardner reports that she ocassionaly has a drop in CBG during the night. Last one was 54, she took 2 glucose tables and had no further problems.     Patient said her current instructions from Dr Sharl MaKerr was to stay on the same sliding scale and basal rate that she has been on.  Patient's basal rate is the same all the time. Patient and I talked about having a glucerna before bed on Tues night.  Karen Gardner reported that she can tell when CBG is low and high.  I instructed patient to check CBG if she feels like it is low and to drink 1/2 cup of clear juice (apple, cranberry) or 1/2 cup sprite. I instructed patient to call [phone] if CBG should continue to be low.           I called, "Karen Gardner'  at Dr Daune PerchKerr's office and asked for someone from their office to call patient and give instructions on what rate to keep Insulin at after midnight and I sent a fax with my instructions and asked that they fax= me back their instructions.

## 2014-08-03 NOTE — Pre-Procedure Instructions (Signed)
Karen Gardner  08/03/2014   Your procedure is scheduled on:  Wednesday, May 4.  [facility] at 1:20 PM.  Call this number if you have problems the morning of surgery: [phone]   Remember:   Do not eat food or drink liquids after midnight Tuesday.   Take these medicines the morning of surgery with A SIP OF WATER: escitalopram (LEXAPRO), lansoprazole (PREVACID), levocetirizine (XYZAL).               Insulin Pump:                 Take of needed:traMADol (ULTRAM)              Stop taking Aspirin, Meloxicam, do not use Voltaren.    Do not wear jewelry, make-up or nail polish.  Do not wear lotions, powders, or perfumes.   Do not shave 48 hours prior to surgery.   Do not bring valuables to the hospital.              [facility] is not responsible  for any belongings or valuables.               Contacts, dentures or bridgework may not be worn into surgery.  Leave suitcase in the car. After surgery it may be brought to your room.  For patients admitted to the hospital, discharge time is determined by your  treatment team.                Special Instructions: Review  [place] - Preparing For Surgery.   Please read over the following fact sheets that you were given: Pain Booklet, Coughing and Deep Breathing, Blood Transfusion Information and Surgical Site Infection Prevention and Incentive Spirometery

## 2014-08-03 NOTE — Pre-Procedure Instructions (Signed)
Karen Gardner  08/03/2014   Your procedure is scheduled on:  Wednesday, May 4.  [facility] at 1:20 PM.  Call this number if you have problems the morning of surgery: [phone]   Remember:   Do not eat food or drink liquids after midnight Tuesday.   Take these medicines the morning of surgery with A SIP OF WATER: escitalopram (LEXAPRO), lansoprazole (PREVACID), levocetirizine (XYZAL).              Stop Meloxicam, Aspirin.               Insulin Pump: Maintain Basal rate after midnight. .  Check CBG.  If CBG less than70 may take 1/2 cup clear Juice (Apple, Cranberry or 1/2 cup regular Sprite.  If CBG continues to drop call -Pre- op - [phone].                  Take of needed:traMADol (ULTRAM)              Stop taking Aspirin, Meloxicam, do not use Voltaren.    Do not wear jewelry, make-up or nail polish.  Do not wear lotions, powders, or perfumes.   Do not shave 48 hours prior to surgery.   Do not bring valuables to the hospital.              [facility] is not responsible  for any belongings or valuables.               Contacts, dentures or bridgework may not be worn into surgery.  Leave suitcase in the car. After surgery it may be brought to your room.  For patients admitted to the hospital, discharge time is determined by your  treatment team.                Special Instructions: Review  [place] - Preparing For Surgery.   Please read over the following fact sheets that you were given: Pain Booklet, Coughing and Deep Breathing, Blood Transfusion Information and Surgical Site Infection Prevention and Incentive Spirometery

## 2014-08-03 NOTE — Pre-Procedure Instructions (Signed)
Karen Gardner  08/03/2014   Your procedure is scheduled on:  Wednesday, May 4.  Report to [facility] Admitting at 1:20 PM.  Call this number if you have problems the morning of surgery: [phone]   Remember:   Do not eat food or drink liquids after midnight Tuesday.   Take these medicines the morning of surgery with A SIP OF WATER: escitalopram (LEXAPRO), lansoprazole (PREVACID), levocetirizine (XYZAL).               Insulin Pump:                 Take of needed:traMADol (ULTRAM)              Stop taking Aspirin, Meloxicam, do not use Voltaren.    Do not wear jewelry, make-up or nail polish.  Do not wear lotions, powders, or perfumes.   Do not shave 48 hours prior to surgery.   Do not bring valuables to the hospital.              [facility] is not responsible  for any belongings or valuables.               Contacts, dentures or bridgework may not be worn into surgery.  Leave suitcase in the car. After surgery it may be brought to your room.  For patients admitted to the hospital, discharge time is determined by your  treatment team.                Special Instructions: Review  [place] - Preparing For Surgery.   Please read over the following fact sheets that you were given: Pain Booklet, Coughing and Deep Breathing, Blood Transfusion Information and Surgical Site Infection Prevention and Incentive Spirometery  

## 2014-08-03 NOTE — Pre-Procedure Instructions (Signed)
Annika D Fawaz  08/03/2014   Your procedure is scheduled on:  Wednesday, May 4.  Report to [facility] Admitting at 1:20 PM.  Call this number if you have problems the morning of surgery: [phone]   Remember:   Do not eat food or drink liquids after midnight Tuesday.   Take these medicines the morning of surgery with A SIP OF WATER: escitalopram (LEXAPRO), lansoprazole (PREVACID), levocetirizine (XYZAL).              Stop Meloxicam, Aspirin.               Insulin Pump: Maintain Basal rate after midnight. .  Check CBG.  If CBG less than70 may take 1/2 cup clear Juice (Apple, Cranberry or 1/2 cup regular Sprite.  If CBG continues to drop call -Pre- op - [phone].                  Take of needed:traMADol (ULTRAM)              Stop taking Aspirin, Meloxicam, do not use Voltaren.    Do not wear jewelry, make-up or nail polish.  Do not wear lotions, powders, or perfumes.   Do not shave 48 hours prior to surgery.   Do not bring valuables to the hospital.              [facility] is not responsible  for any belongings or valuables.               Contacts, dentures or bridgework may not be worn into surgery.  Leave suitcase in the car. After surgery it may be brought to your room.  For patients admitted to the hospital, discharge time is determined by your  treatment team.                Special Instructions: Review  [place] - Preparing For Surgery.   Please read over the following fact sheets that you were given: Pain Booklet, Coughing and Deep Breathing, Blood Transfusion Information and Surgical Site Infection Prevention and Incentive Spirometery  

## 2014-08-04 ENCOUNTER — Other Ambulatory Visit: Payer: Self-pay | Admitting: Orthopedic Surgery

## 2014-08-04 ENCOUNTER — Encounter (HOSPITAL_COMMUNITY): Payer: Self-pay

## 2014-08-04 MED ORDER — CEFAZOLIN SODIUM-DEXTROSE 2-3 GM-% IV SOLR
2.0000 g | INTRAVENOUS | Status: AC
  Administered 2014-08-05: 2 g via INTRAVENOUS
  Filled 2014-08-04: qty 50

## 2014-08-04 MED ORDER — DEXTROSE-NACL 5-0.45 % IV SOLN: INTRAVENOUS | Status: DC

## 2014-08-04 NOTE — Progress Notes (Signed)
Anesthesia Chart Review:  Patient is a 59 year old female scheduled for right TKA on 08/05/14 by Dr. Turner Danielsowan.   History includes former smoker, HTN, exertional dyspnea, GERD, DM2 with insulin pump, iron deficiency anemia (dating back to at least 2007), anxiety, colonoscopy/EGD (date?), ORIF right ankle fracture 02/11/14. She was noted to have CKD stage III [facility] and [facility] records scanned under Media tab.  BMI is consistent with morbid obesity.   PCP is Karen Gardner with [facility] but was previously seen by Dr. [name] who is no longer at that practice. She is now seeing endocrinologist Dr. [name] for her DM and insulin pump management. Reports she has seen hematologist Dr. [name] in the past for anemia and required iron infusion. She reported last seeing Dr. [name] approximately 3 years ago, and now anemia is followed by her PCP who apparently told her that she may need to begin iron infusions again.   Meds include ASA, Lipitor, Lexapro, Insulin pump, Xyzal, Prevacid, Robaxin, tramadol, Diovan-HCT. Dr. Daune PerchKerr's nurse called and spoke with patient's PAT RN and reported that Dr. [name] instructed her to discontinue her insulin pump at [place] before surgery (patient is a type 2 diabetic).  Preoperative labs noted. BUN/Cr 35/1.60, previously 27-35/1.20-1.40 since 11/13/13 in Epic under Media tab. H/H 9.8/32.0, previously 8.5/28 (post ORIF on 02/19/14) with baseline pre-operatively of 10.3/32.8 (was 11.4/34.2 per 01/13/14 labs scanned under Media tab from [facility]).   CXR pending from [facility].  [facility] from [facility].  Will review once received.  If EKG not received prior to her surgery date then one will have to be done on arrival.  I reviewed currently available records with anesthesiologist Dr. Sandford Craze. Gardner. Patient with known CKD and anemia, both of which have mildly progressed in the past six months.  Recommend notifying Dr.  Wadie Gardner's office (I called and spoke with Karen CreeKathy Gardner), patient will need T&S changed to T&C Karen Gardner(Karen Gardner to have Dr. Turner Danielsowan enter order), and patient will need continued out-patient follow-up with her PCP.    Update: 04/07/14 EKG and CXR received from [facility] this afternoon. She was seen on 04/07/14 for chest pain on that radiated to her LUE that was worse with deep breathing and left arm lifting.  She ruled out for MI and PE and discharged with pain medication and a muscle relaxant. EKG showed NSR, possible septal infarct (age undetermined) and was felt unchanged. CXR showed no active cardiopulmonary disease. CTA chest showed negative for PE, no acute chest abnormality, hiatal hernia. I did not reach patient at her home today, but she denied chest pain during her PAT RN interview.  She will be further evaluated by her assigned anesthesiologist on the day of surgery to ensure no acute changes on new or progressive CV symptoms.  T&C order is in Epic, but will defer decision for transfusion to surgeon and/or anesthesiologist.  Shonna ChockAllison Bodie Abernethy, PA-C [facility]/Anesthesiology Phone [phone] 08/04/2014 4:01 PM

## 2014-08-04 NOTE — H&P (Signed)
TOTAL KNEE ADMISSION H&P  Patient is being admitted for right total knee arthroplasty.  Subjective:  Chief Complaint:right knee pain.  HPI: Karen Gardner, 59 y.o. female, has a history of pain and functional disability in the right knee due to arthritis and has failed non-surgical conservative treatments for greater than 12 weeks to includeNSAID's and/or analgesics, corticosteriod injections, flexibility and strengthening excercises, use of assistive devices, weight reduction as appropriate and activity modification.  Onset of symptoms was gradual, starting several years ago with gradually worsening course since that time. The patient noted no past surgery on the right knee(s).  Patient currently rates pain in the right knee(s) at 10 out of 10 with activity. Patient has night pain, worsening of pain with activity and weight bearing, pain that interferes with activities of daily living, pain with passive range of motion, crepitus and joint swelling.  Patient has evidence of joint space narrowing by imaging studies.  There is no active infection.  Patient Active Problem List   Diagnosis Date Noted  . Diabetes mellitus type 2, uncontrolled 02/12/2014  . Essential hypertension 02/12/2014  . Ankle fracture, bimalleolar, closed 02/11/2014  . Essential hypertension, benign 01/28/2014  . Type II diabetes mellitus, uncontrolled 01/21/2014   Past Medical History  Diagnosis Date  . Hypertension   . Arthritis   . GERD (gastroesophageal reflux disease)   . Numbness in both hands     mostly at night  . Anxiety   . Diabetes mellitus without complication     Type 2 on insulin pump  . Shortness of breath dyspnea     with exertion  . Anemia     has had to have iron infusions-sees dr Twanna Hyenever  . Iron deficiency anemia   . CKD (chronic kidney disease)     Past Surgical History  Procedure Laterality Date  . Hernia repair      umb hernia as child  . Colonoscopy    . Upper gi endoscopy    . Knee  arthroscopy  02/12/2012    Procedure: ARTHROSCOPY KNEE;  Surgeon: Nestor LewandowskyFrank J Rowan, MD;  Location: [facility];  Service: Orthopedics;  Laterality: Right;  Partial Lateral Meniscectomy, Debridement chondromalacia  . Orif ankle fracture Right 02/11/2014    Procedure: OPEN REDUCTION INTERNAL FIXATION (ORIF) RIGHT ANKLE FRACTURE;  Surgeon: Nestor LewandowskyFrank J Rowan, MD;  Location: [facility] OR;  Service: Orthopedics;  Laterality: Right;  . Eye surgery Bilateral     [place]     No prescriptions prior to admission   Allergies  Allergen Reactions  . Lactose Intolerance (Gi)   . Oatmeal Other (See Comments)    "Gas"     History  Substance Use Topics  . Smoking status: Former Smoker    Quit date: 02/08/1972  . Smokeless tobacco: Not on file  . Alcohol Use: No    No family history on file.   Review of Systems  Constitutional: Positive for weight loss.  HENT:       Sinus problems  Cardiovascular: Positive for leg swelling.  Gastrointestinal: Negative.   Genitourinary: Negative.   Musculoskeletal: Positive for joint pain.  Skin: Negative.   Endo/Heme/Allergies: Bruises/bleeds easily.  Psychiatric/Behavioral: Negative.     Objective:  Physical Exam  Constitutional: She is oriented to person, place, and time. She appears well-developed and well-nourished.  HENT:  Head: Normocephalic and atraumatic.  Eyes: Pupils are equal, round, and reactive to light.  Neck: Normal range of motion. Neck supple.  Cardiovascular: Intact distal pulses.  Respiratory: Effort normal.  Musculoskeletal:  Patient is tender along the lateral joint line of the right knee range of motion is 0/130, collateral ligaments are stable.  One plus laxity to varus stressing, although again she has a valgus deformity of about 8-10.  There is crepitus along the lateral joint line.  As you take her through range of motion.  Foot tap is negative.  She does have a callosity over the right fifth metatarsal head and some  tenderness in this region consistent with a bunionette.    Neurological: She is alert and oriented to person, place, and time.  Skin: Skin is warm and dry.  Psychiatric: She has a normal mood and affect. Her behavior is normal. Judgment and thought content normal.    Vital signs in last 24 hours:    Labs:   Estimated body mass index is 40.57 kg/(m^2) as calculated from the following:   Height as of 03/09/14:  (1.651 m).   Weight as of 03/09/14: 110.587 kg (243 lb 12.8 oz).   Imaging Review Plain radiographs demonstrate bone-on-bone on the standing and Rosenberg x-rays.  Assessment/Plan:  End stage arthritis, right knee   The patient history, physical examination, clinical judgment of the provider and imaging studies are consistent with end stage degenerative joint disease of the right knee(s) and total knee arthroplasty is deemed medically necessary. The treatment options including medical management, injection therapy arthroscopy and arthroplasty were discussed at length. The risks and benefits of total knee arthroplasty were presented and reviewed. The risks due to aseptic loosening, infection, stiffness, patella tracking problems, thromboembolic complications and other imponderables were discussed. The patient acknowledged the explanation, agreed to proceed with the plan and consent was signed. Patient is being admitted for inpatient treatment for surgery, pain control, PT, OT, prophylactic antibiotics, VTE prophylaxis, progressive ambulation and ADL's and discharge planning. The patient is planning to be discharged to skilled nursing facility

## 2014-08-05 ENCOUNTER — Inpatient Hospital Stay (HOSPITAL_COMMUNITY): Payer: 59 | Admitting: Vascular Surgery

## 2014-08-05 ENCOUNTER — Inpatient Hospital Stay (HOSPITAL_COMMUNITY)
Admission: RE | Admit: 2014-08-05 | Discharge: 2014-08-07 | DRG: 470 | Disposition: A | Discharge status: Skilled Nursing Facility | Payer: 59 | Source: Ambulatory Visit | Attending: Orthopedic Surgery | Admitting: Orthopedic Surgery

## 2014-08-05 ENCOUNTER — Encounter (HOSPITAL_COMMUNITY)
Admission: RE | Disposition: A | Discharge status: Skilled Nursing Facility | Payer: Self-pay | Source: Ambulatory Visit | Attending: Orthopedic Surgery

## 2014-08-05 ENCOUNTER — Encounter (HOSPITAL_COMMUNITY): Payer: Self-pay | Admitting: *Deleted

## 2014-08-05 ENCOUNTER — Inpatient Hospital Stay (HOSPITAL_COMMUNITY): Payer: 59 | Admitting: Anesthesiology

## 2014-08-05 DIAGNOSIS — Z87891 Personal history of nicotine dependence: Secondary | ICD-10-CM

## 2014-08-05 DIAGNOSIS — Z6841 Body Mass Index (BMI) 40.0 and over, adult: Secondary | ICD-10-CM

## 2014-08-05 DIAGNOSIS — Z7982 Long term (current) use of aspirin: Secondary | ICD-10-CM

## 2014-08-05 DIAGNOSIS — M1711 Unilateral primary osteoarthritis, right knee: Secondary | ICD-10-CM | POA: Diagnosis present

## 2014-08-05 DIAGNOSIS — M25561 Pain in right knee: Secondary | ICD-10-CM | POA: Diagnosis present

## 2014-08-05 DIAGNOSIS — K219 Gastro-esophageal reflux disease without esophagitis: Secondary | ICD-10-CM | POA: Diagnosis present

## 2014-08-05 DIAGNOSIS — F419 Anxiety disorder, unspecified: Secondary | ICD-10-CM | POA: Diagnosis present

## 2014-08-05 DIAGNOSIS — M1710 Unilateral primary osteoarthritis, unspecified knee: Secondary | ICD-10-CM | POA: Diagnosis present

## 2014-08-05 DIAGNOSIS — I129 Hypertensive chronic kidney disease with stage 1 through stage 4 chronic kidney disease, or unspecified chronic kidney disease: Secondary | ICD-10-CM | POA: Diagnosis present

## 2014-08-05 DIAGNOSIS — Z794 Long term (current) use of insulin: Secondary | ICD-10-CM | POA: Diagnosis not present

## 2014-08-05 DIAGNOSIS — D62 Acute posthemorrhagic anemia: Secondary | ICD-10-CM | POA: Diagnosis not present

## 2014-08-05 DIAGNOSIS — N183 Chronic kidney disease, stage 3 (moderate): Secondary | ICD-10-CM | POA: Diagnosis present

## 2014-08-05 DIAGNOSIS — Z9641 Presence of insulin pump (external) (internal): Secondary | ICD-10-CM | POA: Diagnosis present

## 2014-08-05 DIAGNOSIS — E119 Type 2 diabetes mellitus without complications: Secondary | ICD-10-CM | POA: Diagnosis present

## 2014-08-05 DIAGNOSIS — E739 Lactose intolerance, unspecified: Secondary | ICD-10-CM | POA: Diagnosis present

## 2014-08-05 DIAGNOSIS — Z79899 Other long term (current) drug therapy: Secondary | ICD-10-CM

## 2014-08-05 DIAGNOSIS — S82891A Other fracture of right lower leg, initial encounter for closed fracture: Secondary | ICD-10-CM

## 2014-08-05 HISTORY — PX: TOTAL KNEE ARTHROPLASTY: SHX125

## 2014-08-05 LAB — GLUCOSE, CAPILLARY
GLUCOSE-CAPILLARY: 79 mg/dL (ref 70–99)
GLUCOSE-CAPILLARY: 187 mg/dL — AB (ref 70–99)
Glucose-Capillary: 71 mg/dL (ref 70–99)
Glucose-Capillary: 69 mg/dL — ABNORMAL LOW (ref 70–99)
Glucose-Capillary: 116 mg/dL — ABNORMAL HIGH (ref 70–99)

## 2014-08-05 LAB — PREPARE RBC (CROSSMATCH)

## 2014-08-05 SURGERY — ARTHROPLASTY, KNEE, TOTAL
Anesthesia: Monitor Anesthesia Care | Site: Knee | Laterality: Right

## 2014-08-05 MED ORDER — STERILE WATER FOR INJECTION IJ SOLN
INTRAMUSCULAR | Status: AC
  Filled 2014-08-05: qty 10

## 2014-08-05 MED ORDER — INSULIN ASPART 100 UNIT/ML ~~LOC~~ SOLN: 0.0000 [IU] | Freq: Three times a day (TID) | SUBCUTANEOUS | Status: DC

## 2014-08-05 MED ORDER — PROPOFOL 10 MG/ML IV BOLUS
INTRAVENOUS | Status: AC
  Filled 2014-08-05: qty 20

## 2014-08-05 MED ORDER — INSULIN PUMP: SUBCUTANEOUS | Status: DC

## 2014-08-05 MED ORDER — METHOCARBAMOL 500 MG PO TABS
500.0000 mg | ORAL_TABLET | Freq: Four times a day (QID) | ORAL | Status: DC | PRN
  Administered 2014-08-05 – 2014-08-07 (×4): 500 mg via ORAL
  Filled 2014-08-05 (×4): qty 1

## 2014-08-05 MED ORDER — ONDANSETRON HCL 4 MG PO TABS: 4.0000 mg | ORAL_TABLET | Freq: Four times a day (QID) | ORAL | Status: DC | PRN

## 2014-08-05 MED ORDER — METOCLOPRAMIDE HCL 5 MG PO TABS: 5.0000 mg | ORAL_TABLET | Freq: Three times a day (TID) | ORAL | Status: DC | PRN

## 2014-08-05 MED ORDER — MIDAZOLAM HCL 5 MG/5ML IJ SOLN
INTRAMUSCULAR | Status: DC | PRN
  Administered 2014-08-05 (×2): 1 mg via INTRAVENOUS

## 2014-08-05 MED ORDER — METOCLOPRAMIDE HCL 5 MG/ML IJ SOLN: 5.0000 mg | Freq: Three times a day (TID) | INTRAMUSCULAR | Status: DC | PRN

## 2014-08-05 MED ORDER — ASPIRIN EC 325 MG PO TBEC
325.0000 mg | DELAYED_RELEASE_TABLET | Freq: Every day | ORAL | Status: DC
  Administered 2014-08-06 – 2014-08-07 (×2): 325 mg via ORAL
  Filled 2014-08-05 (×3): qty 1

## 2014-08-05 MED ORDER — ACETAMINOPHEN 650 MG RE SUPP: 650.0000 mg | Freq: Four times a day (QID) | RECTAL | Status: DC | PRN

## 2014-08-05 MED ORDER — FLEET ENEMA 7-19 GM/118ML RE ENEM: 1.0000 | ENEMA | Freq: Once | RECTAL | Status: AC | PRN

## 2014-08-05 MED ORDER — ESCITALOPRAM OXALATE 10 MG PO TABS
10.0000 mg | ORAL_TABLET | Freq: Every day | ORAL | Status: DC
  Administered 2014-08-06 – 2014-08-07 (×2): 10 mg via ORAL
  Filled 2014-08-05 (×2): qty 1

## 2014-08-05 MED ORDER — BISACODYL 5 MG PO TBEC: 5.0000 mg | DELAYED_RELEASE_TABLET | Freq: Every day | ORAL | Status: DC | PRN

## 2014-08-05 MED ORDER — HYDROMORPHONE HCL 1 MG/ML IJ SOLN
0.2500 mg | INTRAMUSCULAR | Status: DC | PRN
  Administered 2014-08-05 (×4): 0.5 mg via INTRAVENOUS

## 2014-08-05 MED ORDER — KCL IN DEXTROSE-NACL 20-5-0.45 MEQ/L-%-% IV SOLN
INTRAVENOUS | Status: DC
  Administered 2014-08-05: 125 mL/h via INTRAVENOUS
  Administered 2014-08-05: 23:00:00 via INTRAVENOUS
  Filled 2014-08-05 (×9): qty 1000

## 2014-08-05 MED ORDER — OXYCODONE HCL 5 MG PO TABS: 5.0000 mg | ORAL_TABLET | Freq: Once | ORAL | Status: DC | PRN

## 2014-08-05 MED ORDER — ALUM & MAG HYDROXIDE-SIMETH 200-200-20 MG/5ML PO SUSP
30.0000 mL | ORAL | Status: DC | PRN
  Administered 2014-08-06: 30 mL via ORAL
  Filled 2014-08-05: qty 30

## 2014-08-05 MED ORDER — ONDANSETRON HCL 4 MG/2ML IJ SOLN
INTRAMUSCULAR | Status: AC
  Filled 2014-08-05: qty 2

## 2014-08-05 MED ORDER — INSULIN ASPART 100 UNIT/ML ~~LOC~~ SOLN: 3.0000 [IU] | Freq: Three times a day (TID) | SUBCUTANEOUS | Status: DC

## 2014-08-05 MED ORDER — HYDROMORPHONE HCL 1 MG/ML IJ SOLN
INTRAMUSCULAR | Status: AC
  Filled 2014-08-05: qty 1

## 2014-08-05 MED ORDER — PROPOFOL 500 MG/50ML IV EMUL
INTRAVENOUS | Status: DC | PRN
  Administered 2014-08-05: 150 mg via INTRAVENOUS

## 2014-08-05 MED ORDER — ONDANSETRON HCL 4 MG/2ML IJ SOLN
INTRAMUSCULAR | Status: DC | PRN
  Administered 2014-08-05: 4 mg via INTRAVENOUS

## 2014-08-05 MED ORDER — BUPIVACAINE LIPOSOME 1.3 % IJ SUSP
20.0000 mL | INTRAMUSCULAR | Status: AC
  Administered 2014-08-05: 20 mL
  Filled 2014-08-05: qty 20

## 2014-08-05 MED ORDER — ASPIRIN EC 325 MG PO TBEC: 325.0000 mg | DELAYED_RELEASE_TABLET | Freq: Two times a day (BID) | ORAL | Status: DC

## 2014-08-05 MED ORDER — VALSARTAN-HYDROCHLOROTHIAZIDE 80-12.5 MG PO TABS: 1.0000 | ORAL_TABLET | Freq: Every day | ORAL | Status: DC

## 2014-08-05 MED ORDER — TRANEXAMIC ACID 1000 MG/10ML IV SOLN
1000.0000 mg | INTRAVENOUS | Status: AC
  Administered 2014-08-05: 1000 mg via INTRAVENOUS
  Filled 2014-08-05: qty 10

## 2014-08-05 MED ORDER — OXYCODONE HCL 5 MG/5ML PO SOLN: 5.0000 mg | Freq: Once | ORAL | Status: DC | PRN

## 2014-08-05 MED ORDER — INSULIN PUMP
Freq: Three times a day (TID) | SUBCUTANEOUS | Status: DC
  Filled 2014-08-05: qty 1

## 2014-08-05 MED ORDER — MIDAZOLAM HCL 2 MG/2ML IJ SOLN
INTRAMUSCULAR | Status: AC
  Filled 2014-08-05: qty 2

## 2014-08-05 MED ORDER — INSULIN LISPRO 100 UNIT/ML ~~LOC~~ SOLN: 20.0000 [IU] | Freq: Every day | SUBCUTANEOUS | Status: DC

## 2014-08-05 MED ORDER — METHOCARBAMOL 500 MG PO TABS: 500.0000 mg | ORAL_TABLET | Freq: Three times a day (TID) | ORAL | Status: DC

## 2014-08-05 MED ORDER — PHENOL 1.4 % MT LIQD: 1.0000 | OROMUCOSAL | Status: DC | PRN

## 2014-08-05 MED ORDER — HYDROCHLOROTHIAZIDE 12.5 MG PO CAPS
12.5000 mg | ORAL_CAPSULE | Freq: Every day | ORAL | Status: DC
  Administered 2014-08-06 – 2014-08-07 (×2): 12.5 mg via ORAL
  Filled 2014-08-05 (×2): qty 1

## 2014-08-05 MED ORDER — CEFUROXIME SODIUM 1.5 G IJ SOLR
INTRAMUSCULAR | Status: AC
  Filled 2014-08-05: qty 1.5

## 2014-08-05 MED ORDER — SODIUM CHLORIDE 0.9 % IR SOLN
Status: DC | PRN
  Administered 2014-08-05: 1000 mL

## 2014-08-05 MED ORDER — EPHEDRINE SULFATE 50 MG/ML IJ SOLN
INTRAMUSCULAR | Status: DC | PRN
  Administered 2014-08-05: 5 mg via INTRAVENOUS

## 2014-08-05 MED ORDER — ACETAMINOPHEN 325 MG PO TABS
650.0000 mg | ORAL_TABLET | Freq: Four times a day (QID) | ORAL | Status: DC | PRN
  Administered 2014-08-06: 650 mg via ORAL
  Filled 2014-08-05 (×2): qty 2

## 2014-08-05 MED ORDER — OXYCODONE-ACETAMINOPHEN 10-325 MG PO TABS: 0.5000 | ORAL_TABLET | Freq: Four times a day (QID) | ORAL | Status: DC | PRN

## 2014-08-05 MED ORDER — METHOCARBAMOL 1000 MG/10ML IJ SOLN
500.0000 mg | Freq: Four times a day (QID) | INTRAVENOUS | Status: DC | PRN
  Administered 2014-08-05: 500 mg via INTRAVENOUS
  Filled 2014-08-05: qty 5

## 2014-08-05 MED ORDER — FENTANYL CITRATE (PF) 100 MCG/2ML IJ SOLN
INTRAMUSCULAR | Status: DC | PRN
  Administered 2014-08-05: 25 ug via INTRAVENOUS
  Administered 2014-08-05: 100 ug via INTRAVENOUS
  Administered 2014-08-05: 25 ug via INTRAVENOUS
  Administered 2014-08-05 (×2): 50 ug via INTRAVENOUS

## 2014-08-05 MED ORDER — SODIUM CHLORIDE 0.9 % IJ SOLN
INTRAMUSCULAR | Status: DC | PRN
  Administered 2014-08-05: 40 mL

## 2014-08-05 MED ORDER — DIPHENHYDRAMINE HCL 12.5 MG/5ML PO ELIX: 12.5000 mg | ORAL_SOLUTION | ORAL | Status: DC | PRN

## 2014-08-05 MED ORDER — DOCUSATE SODIUM 100 MG PO CAPS
100.0000 mg | ORAL_CAPSULE | Freq: Two times a day (BID) | ORAL | Status: DC
Start: 2014-08-05 — End: 2014-08-07
  Administered 2014-08-05 – 2014-08-07 (×4): 100 mg via ORAL
  Filled 2014-08-05 (×4): qty 1

## 2014-08-05 MED ORDER — BUPIVACAINE-EPINEPHRINE (PF) 0.5% -1:200000 IJ SOLN
INTRAMUSCULAR | Status: DC | PRN
  Administered 2014-08-05: 20 mL via PERINEURAL

## 2014-08-05 MED ORDER — KCL IN DEXTROSE-NACL 20-5-0.45 MEQ/L-%-% IV SOLN
INTRAVENOUS | Status: AC
  Filled 2014-08-05: qty 1000

## 2014-08-05 MED ORDER — ACETAMINOPHEN 160 MG/5ML PO SOLN
325.0000 mg | ORAL | Status: DC | PRN
  Filled 2014-08-05: qty 20.3

## 2014-08-05 MED ORDER — BUPIVACAINE LIPOSOME 1.3 % IJ SUSP: INTRAMUSCULAR | Status: DC | PRN

## 2014-08-05 MED ORDER — LORATADINE 10 MG PO TABS
10.0000 mg | ORAL_TABLET | Freq: Every day | ORAL | Status: DC
  Administered 2014-08-05 – 2014-08-07 (×3): 10 mg via ORAL
  Filled 2014-08-05 (×3): qty 1

## 2014-08-05 MED ORDER — MIDAZOLAM HCL 2 MG/2ML IJ SOLN
INTRAMUSCULAR | Status: AC
  Administered 2014-08-05: 1 mg
  Filled 2014-08-05: qty 2

## 2014-08-05 MED ORDER — PNEUMOCOCCAL VAC POLYVALENT 25 MCG/0.5ML IJ INJ: 0.5000 mL | INJECTION | INTRAMUSCULAR | Status: DC

## 2014-08-05 MED ORDER — FENTANYL CITRATE (PF) 250 MCG/5ML IJ SOLN
INTRAMUSCULAR | Status: AC
  Filled 2014-08-05: qty 5

## 2014-08-05 MED ORDER — INSULIN ASPART 100 UNIT/ML ~~LOC~~ SOLN: 0.0000 [IU] | Freq: Every day | SUBCUTANEOUS | Status: DC

## 2014-08-05 MED ORDER — HYDROMORPHONE HCL 1 MG/ML IJ SOLN
1.0000 mg | INTRAMUSCULAR | Status: DC | PRN
  Administered 2014-08-05 – 2014-08-07 (×3): 1 mg via INTRAVENOUS
  Filled 2014-08-05 (×3): qty 1

## 2014-08-05 MED ORDER — LEVOCETIRIZINE DIHYDROCHLORIDE 5 MG PO TABS: 5.0000 mg | ORAL_TABLET | Freq: Every day | ORAL | Status: DC

## 2014-08-05 MED ORDER — LIDOCAINE HCL (CARDIAC) 20 MG/ML IV SOLN
INTRAVENOUS | Status: AC
  Filled 2014-08-05: qty 5

## 2014-08-05 MED ORDER — PANTOPRAZOLE SODIUM 20 MG PO TBEC
20.0000 mg | DELAYED_RELEASE_TABLET | Freq: Every day | ORAL | Status: DC
  Administered 2014-08-06 – 2014-08-07 (×2): 20 mg via ORAL
  Filled 2014-08-05 (×2): qty 1

## 2014-08-05 MED ORDER — SODIUM CHLORIDE 0.9 % IV SOLN
INTRAVENOUS | Status: DC
  Administered 2014-08-05 (×3): via INTRAVENOUS

## 2014-08-05 MED ORDER — ACETAMINOPHEN 10 MG/ML IV SOLN
INTRAVENOUS | Status: AC
  Filled 2014-08-05: qty 100

## 2014-08-05 MED ORDER — CHLORHEXIDINE GLUCONATE 4 % EX LIQD
60.0000 mL | Freq: Once | CUTANEOUS | Status: DC
  Filled 2014-08-05: qty 60

## 2014-08-05 MED ORDER — PROPOFOL INFUSION 10 MG/ML OPTIME
INTRAVENOUS | Status: DC | PRN
  Administered 2014-08-05: 100 ug/kg/min via INTRAVENOUS

## 2014-08-05 MED ORDER — FENTANYL CITRATE (PF) 100 MCG/2ML IJ SOLN
INTRAMUSCULAR | Status: AC
  Administered 2014-08-05: 50 ug
  Filled 2014-08-05: qty 2

## 2014-08-05 MED ORDER — EPHEDRINE SULFATE 50 MG/ML IJ SOLN
INTRAMUSCULAR | Status: AC
  Filled 2014-08-05: qty 1

## 2014-08-05 MED ORDER — OXYCODONE HCL 5 MG PO TABS
5.0000 mg | ORAL_TABLET | ORAL | Status: DC | PRN
  Administered 2014-08-05 – 2014-08-07 (×11): 10 mg via ORAL
  Filled 2014-08-05 (×11): qty 2

## 2014-08-05 MED ORDER — METHOCARBAMOL 1000 MG/10ML IJ SOLN
500.0000 mg | INTRAVENOUS | Status: DC
  Filled 2014-08-05: qty 5

## 2014-08-05 MED ORDER — LACTATED RINGERS IV SOLN: INTRAVENOUS | Status: DC

## 2014-08-05 MED ORDER — CEFUROXIME SODIUM 1.5 G IJ SOLR
INTRAMUSCULAR | Status: DC | PRN
  Administered 2014-08-05: 1.5 g

## 2014-08-05 MED ORDER — MENTHOL 3 MG MT LOZG: 1.0000 | LOZENGE | OROMUCOSAL | Status: DC | PRN

## 2014-08-05 MED ORDER — ONDANSETRON HCL 4 MG/2ML IJ SOLN: 4.0000 mg | Freq: Four times a day (QID) | INTRAMUSCULAR | Status: DC | PRN

## 2014-08-05 MED ORDER — SENNOSIDES-DOCUSATE SODIUM 8.6-50 MG PO TABS: 1.0000 | ORAL_TABLET | Freq: Every evening | ORAL | Status: DC | PRN

## 2014-08-05 MED ORDER — IRBESARTAN 75 MG PO TABS
75.0000 mg | ORAL_TABLET | Freq: Every day | ORAL | Status: DC
  Administered 2014-08-06 – 2014-08-07 (×2): 75 mg via ORAL
  Filled 2014-08-05 (×2): qty 1

## 2014-08-05 MED ORDER — ACETAMINOPHEN 325 MG PO TABS: 325.0000 mg | ORAL_TABLET | ORAL | Status: DC | PRN

## 2014-08-05 MED ORDER — ACETAMINOPHEN 10 MG/ML IV SOLN
INTRAVENOUS | Status: DC | PRN
  Administered 2014-08-05: 1000 mg via INTRAVENOUS

## 2014-08-05 MED ORDER — INSULIN GLARGINE 100 UNIT/ML ~~LOC~~ SOLN
10.0000 [IU] | Freq: Every day | SUBCUTANEOUS | Status: DC
  Filled 2014-08-05 (×2): qty 0.1

## 2014-08-05 MED ORDER — BUPIVACAINE IN DEXTROSE 0.75-8.25 % IT SOLN
INTRATHECAL | Status: DC | PRN
  Administered 2014-08-05: 1.8 mL via INTRATHECAL

## 2014-08-05 SURGICAL SUPPLY — 61 items
BANDAGE ESMARK 6X9 LF (GAUZE/BANDAGES/DRESSINGS) ×1 IMPLANT
BLADE SAG 18X100X1.27 (BLADE) ×2 IMPLANT
BLADE SAW SGTL 13X75X1.27 (BLADE) ×2 IMPLANT
BLADE SURG ROTATE 9660 (MISCELLANEOUS) IMPLANT
BNDG ELASTIC 6X10 VLCR STRL LF (GAUZE/BANDAGES/DRESSINGS) ×2 IMPLANT
BNDG ESMARK 6X9 LF (GAUZE/BANDAGES/DRESSINGS) ×2
BOWL SMART MIX CTS (DISPOSABLE) ×2 IMPLANT
CAPT KNEE TOTAL 3 ATTUNE ×2 IMPLANT
CEMENT HV SMART SET (Cement) ×2 IMPLANT
COVER SURGICAL LIGHT HANDLE (MISCELLANEOUS) ×2 IMPLANT
CUFF TOURNIQUET SINGLE 34IN LL (TOURNIQUET CUFF) IMPLANT
CUFF TOURNIQUET SINGLE 44IN (TOURNIQUET CUFF) IMPLANT
DRAPE EXTREMITY T 121X128X90 (DRAPE) ×2 IMPLANT
DRAPE IMP U-DRAPE 54X76 (DRAPES) ×2 IMPLANT
DRAPE U-SHAPE 47X51 STRL (DRAPES) ×2 IMPLANT
DURAPREP 26ML APPLICATOR (WOUND CARE) ×4 IMPLANT
ELECT REM PT RETURN 9FT ADLT (ELECTROSURGICAL) ×2
ELECTRODE REM PT RTRN 9FT ADLT (ELECTROSURGICAL) ×1 IMPLANT
EVACUATOR 1/8 PVC DRAIN (DRAIN) IMPLANT
GAUZE SPONGE 4X4 12PLY STRL (GAUZE/BANDAGES/DRESSINGS) ×4 IMPLANT
GAUZE XEROFORM 1X8 LF (GAUZE/BANDAGES/DRESSINGS) ×2 IMPLANT
GLOVE BIO SURGEON STRL SZ7.5 (GLOVE) ×2 IMPLANT
GLOVE BIO SURGEON STRL SZ8.5 (GLOVE) ×2 IMPLANT
GLOVE BIOGEL PI IND STRL 8 (GLOVE) ×1 IMPLANT
GLOVE BIOGEL PI IND STRL 9 (GLOVE) ×1 IMPLANT
GLOVE BIOGEL PI INDICATOR 8 (GLOVE) ×1
GLOVE BIOGEL PI INDICATOR 9 (GLOVE) ×1
GOWN STRL REUS W/ TWL LRG LVL3 (GOWN DISPOSABLE) ×1 IMPLANT
GOWN STRL REUS W/ TWL XL LVL3 (GOWN DISPOSABLE) ×2 IMPLANT
GOWN STRL REUS W/TWL LRG LVL3 (GOWN DISPOSABLE) ×1
GOWN STRL REUS W/TWL XL LVL3 (GOWN DISPOSABLE) ×2
HANDPIECE INTERPULSE COAX TIP (DISPOSABLE) ×1
HOOD PEEL AWAY FACE SHEILD DIS (HOOD) ×4 IMPLANT
KIT BASIN OR (CUSTOM PROCEDURE TRAY) ×2 IMPLANT
KIT ROOM TURNOVER OR (KITS) ×2 IMPLANT
MANIFOLD NEPTUNE II (INSTRUMENTS) ×2 IMPLANT
NDL SAFETY ECLIPSE 18X1.5 (NEEDLE) IMPLANT
NEEDLE 22X1 1/2 (OR ONLY) (NEEDLE) ×2 IMPLANT
NEEDLE HYPO 18GX1.5 SHARP (NEEDLE)
NEEDLE SPNL 18GX3.5 QUINCKE PK (NEEDLE) IMPLANT
NS IRRIG 1000ML POUR BTL (IV SOLUTION) ×2 IMPLANT
PACK TOTAL JOINT (CUSTOM PROCEDURE TRAY) ×2 IMPLANT
PACK UNIVERSAL I (CUSTOM PROCEDURE TRAY) ×2 IMPLANT
PAD ARMBOARD 7.5X6 YLW CONV (MISCELLANEOUS) ×4 IMPLANT
PADDING CAST COTTON 6X4 STRL (CAST SUPPLIES) ×2 IMPLANT
SET HNDPC FAN SPRY TIP SCT (DISPOSABLE) ×1 IMPLANT
SUCTION FRAZIER TIP 10 FR DISP (SUCTIONS) ×2 IMPLANT
SUT VIC AB 0 CT1 27 (SUTURE) ×1
SUT VIC AB 0 CT1 27XBRD ANBCTR (SUTURE) ×1 IMPLANT
SUT VIC AB 1 CTX 36 (SUTURE) ×1
SUT VIC AB 1 CTX36XBRD ANBCTR (SUTURE) ×1 IMPLANT
SUT VIC AB 2-0 CT1 27 (SUTURE) ×1
SUT VIC AB 2-0 CT1 TAPERPNT 27 (SUTURE) ×1 IMPLANT
SUT VIC AB 3-0 CT1 27 (SUTURE) ×1
SUT VIC AB 3-0 CT1 TAPERPNT 27 (SUTURE) ×1 IMPLANT
SUT VIC AB 3-0 FS2 27 (SUTURE) ×2 IMPLANT
SYR 30ML LL (SYRINGE) ×2 IMPLANT
SYR 50ML LL SCALE MARK (SYRINGE) ×2 IMPLANT
TOWEL OR 17X24 6PK STRL BLUE (TOWEL DISPOSABLE) ×2 IMPLANT
TOWEL OR 17X26 10 PK STRL BLUE (TOWEL DISPOSABLE) ×2 IMPLANT
WATER STERILE IRR 1000ML POUR (IV SOLUTION) ×6 IMPLANT

## 2014-08-05 NOTE — Progress Notes (Signed)
Pt states she removed her insulin pump this am. CBG taken this am 79.

## 2014-08-05 NOTE — Op Note (Signed)
PATIENT ID:      Karen LoboMarian D Strieter  MRN:     [id number] DOB/AGE:    61-Jul-1957 / 61 y.o.       OPERATIVE REPORT    DATE OF PROCEDURE:  08/05/2014       PREOPERATIVE DIAGNOSIS:   OSTEOARTHRITIS RIGHT KNEE,  Valgus     Estimated body mass index is 40.27 kg/(m^2) as calculated from the following:   Height as of this encounter: 5\' 5"  (1.651 m).   Weight as of this encounter: 109.77 kg (242 lb).                                                        POSTOPERATIVE DIAGNOSIS:   OSTEOARTHRITIS RIGHT KNEE,Valgus                                                                      PROCEDURE:  Procedure(s): TOTAL KNEE ARTHROPLASTY Using DepuyAttune RP implants #5R Femur, #6Tibia, 5 mm Attune RP bearing, 38 Patella     SURGEON: Pleasant Bensinger J    ASSISTANT:   Eric K. [employer] PA-C   (Present and scrubbed throughout the case, critical for assistance with exposure, retraction, instrumentation, and closure.)         ANESTHESIA: GET, Spinal, Exparel  EBL: 300  FLUID REPLACEMENT: 1500 crystalloid  TOURNIQUET TIME: 15min  Drains: None  Tranexamic Acid: 1gm IV   COMPLICATIONS:  None         INDICATIONS FOR PROCEDURE: The patient has  OSTEOARTHRITIS RIGHT KNEE, varus deformities, XR shows bone on bone arthritis. Patient has failed all conservative measures including anti-inflammatory medicines, narcotics, attempts at  exercise and weight loss, cortisone injections and viscosupplementation.  Risks and benefits of surgery have been discussed, questions answered.   DESCRIPTION OF PROCEDURE: The patient identified by armband, received  IV antibiotics, in [facility] holding [facility] at [facility]. Patient taken to the operating room, appropriate anesthetic  monitors were attached, and general endotracheal anesthesia induced with  the patient in supine position. Tourniquet  applied high to the operative thigh. Lateral post and foot positioner  applied to the table, the lower extremity was then prepped and draped   in usual sterile fashion from the ankle to the tourniquet. Time-out procedure was performed. We began the operation, with the knee flexed 100 degrees, by making the anterior midline incision starting at handbreadth above the patella going over the patella 1 cm medial to and 4 cm distal to the tibial tubercle. Small bleeders in the skin and the  subcutaneous tissue identified and cauterized. Transverse retinaculum was incised and reflected medially and a medial parapatellar arthrotomy was accomplished. the patella was everted and theprepatellar fat pad resected. The superficial medial collateral  ligament was then elevated from anterior to posterior along the proximal  flare of the tibia and anterior half of the menisci resected. The knee was hyperflexed exposing bone on bone arthritis. Peripheral and notch osteophytes as well as the cruciate ligaments were then resected. We continued to  work our way around posteriorly along the proximal tibia, and  externally  rotated the tibia subluxing it out from underneath the femur. A McHale  retractor was placed through the notch and a lateral Hohmann retractor  placed, and we then drilled through the proximal tibia in line with the  axis of the tibia followed by an intramedullary guide rod and 2-degree  posterior slope cutting guide. The tibial cutting guide, 3 degree posterior sloped, was pinned into place allowing resection of 7 mm of bone medially and about 1 mm of bone laterally. Satisfied with the tibial resection, we then  entered the distal femur 2 mm anterior to the PCL origin with the  intramedullary guide rod and applied the distal femoral cutting guide  set at 9mm, with 5 degrees of valgus. This was pinned along the  epicondylar axis. At this point, the distal femoral cut was accomplished without difficulty. We then sized for a #5R femoral component and pinned the guide in 0 degrees of external rotation.The chamfer cutting guide was pinned into  place. The anterior, posterior, and chamfer cuts were accomplished without difficulty followed by  the Attune RP box cutting guide and the box cut. We also removed posterior osteophytes from the posterior femoral condyles. At this  time, the knee was brought into full extension. We checked our  extension and flexion gaps and found them symmetric for a 6 mm bearing. Distracting in extension with a lamina spreader, the posterior horns of the menisci were removed, and Exparel, diluted to 60 cc, was injected into the capsule of the knee. The  posterior patella cut was accomplished with the 9.5 mm Attune cutting guide, sized at 38 dome, and the fixation pegs drilled.The knee  was then once again hyperflexed exposing the proximal tibia. We sized for a #6 tibial base plate, applied the smokestack and the conical reamer followed by the the Delta fin keel punch. We then hammered into place the Attune RP trial femoral component, inserted a  6 mm trial bearing, trial patellar button, and took the knee through range of motion from 0-130 degrees. No thumb pressure was required for patellar  Tracking. At this point, the limb was wrapped with an Esmarch bandage and the tourniquet inflated to 350 mmHg. All trial components were removed, mating surfaces irrigated with pulse lavage, and dried with suction and sponges. A double batch of DePuy HV cement with 1500 mg of Zinacef was mixed and applied to all bony metallic mating surfaces except for the posterior condyles of the femur itself. In order, we  hammered into place the tibial tray and removed excess cement, the femoral component and removed excess cement,  The 6mm  Attune RP bearing  was inserted, and the knee brought to full extension with compression.  The patellar button was clamped into place, and excess cement  removed. While the cement cured the wound was irrigated out with normal saline solution pulse lavage. Ligament stability and patellar tracking were checked  and found to be excellent. The parapatellar arthrotomy was closed with  running #1 Vicryl suture. The subcutaneous tissue with 0 and 2-0 undyed  Vicryl suture, and the skin with running 3-0 SQ vicryl. A dressing of Xeroform,  4 x 4, dressing sponges, Webril, and Ace wrap applied. The patient  awakened, extubated, and taken to recovery room without difficulty.   Lillianne Eick J 08/05/2014, 1:24 PM

## 2014-08-05 NOTE — Anesthesia Procedure Notes (Addendum)
Procedure Name: LMA Insertion Date/Time: 08/05/2014 12:15 PM Performed by: Thornell MuleSTUBBLEFIELD, HOWARD G Pre-anesthesia Checklist: Patient identified, Emergency Drugs available, Suction available, Patient being monitored and Timeout performed Patient Re-evaluated:Patient Re-evaluated prior to inductionOxygen Delivery Method: Circle system utilized Preoxygenation: Pre-oxygenation with 100% oxygen Intubation Type: IV induction Ventilation: Mask ventilation without difficulty LMA: LMA inserted LMA Size: 4.0 Number of attempts: 1 Placement Confirmation: positive ETCO2 Tube secured with: Tape   Anesthesia Regional Block:  Adductor canal block  Pre-Anesthetic Checklist: ,, timeout performed, Correct Patient, Correct Site, Correct Laterality, Correct Procedure, Correct Position, site marked, Risks and benefits discussed,  Surgical consent,  Pre-op evaluation,  At surgeon's request and post-op pain management  Laterality: Lower and Right  Prep: chloraprep       Needles:  Injection technique: Single-shot  Needle Type: Echogenic Stimulator Needle          Additional Needles:  Procedures: ultrasound guided (picture in chart) Adductor canal block Narrative:  Injection made incrementally with aspirations every 5 mL.  Performed by: Personally  Anesthesiologist: Lauren Aguayo, CHRIS  Additional Notes: H+P and labs reviewed, risks and benefits discussed with patient, procedure tolerated well without complications   Spinal Patient location during procedure: OR Staffing Anesthesiologist: Tahlor Berenguer, CHRIS Preanesthetic Checklist Completed: patient identified, surgical consent, pre-op evaluation, timeout performed, IV checked, risks and benefits discussed and monitors and equipment checked Spinal Block Patient position: sitting Prep: site prepped and draped and DuraPrep Patient monitoring: heart rate, cardiac monitor, continuous pulse ox and blood pressure Approach: midline Location: L3-4 Injection  technique: single-shot Needle Needle type: Pencan  Needle gauge: 24 G Needle length: 10 cm Assessment Sensory level: T12

## 2014-08-05 NOTE — Discharge Instructions (Signed)

## 2014-08-05 NOTE — Interval H&P Note (Signed)
History and Physical Interval Note:  08/05/2014 11:37 AM  Karen LoboMarian D Bocanegra  has presented today for surgery, with the diagnosis of OSTEOARTHRITIS RIGHT KNEE  The various methods of treatment have been discussed with the patient and family. After consideration of risks, benefits and other options for treatment, the patient has consented to  Procedure(s): TOTAL KNEE ARTHROPLASTY (Right) as a surgical intervention .  The patient's history has been reviewed, patient examined, no change in status, stable for surgery.  I have reviewed the patient's chart and labs.  Questions were answered to the patient's satisfaction.     Nestor LewandowskyOWAN,Trenity Pha J

## 2014-08-05 NOTE — Transfer of Care (Signed)
Immediate Anesthesia Transfer of Care Note  Patient: Karen Gardner  Procedure(s) Performed: Procedure(s): TOTAL KNEE ARTHROPLASTY (Right)  Patient Location: PACU  Anesthesia Type:General  Level of Consciousness: awake, alert  and oriented  Airway & Oxygen Therapy: Patient Spontanous Breathing and Patient connected to nasal cannula oxygen  Post-op Assessment: Report given to RN and Post -op Vital signs reviewed and stable  Post vital signs: Reviewed and stable  Last Vitals:  Filed Vitals:   08/05/14 1058  BP:   Pulse: 69  Temp:   Resp: 8    Complications: No apparent anesthesia complications

## 2014-08-05 NOTE — Progress Notes (Signed)
CBG 71 called to Dr Maple HudsonMoser, no new orders at this time.

## 2014-08-05 NOTE — Progress Notes (Signed)
Pt admitted to 5N15 from PACU. Pt is alert and oriented x4, yet drowsy, able to follow commands, denies any pain at this time. VSS, no s/s of respiratory distress on 2L [place]. Standard hospital orientation completed, bed in lowest position, call bell is within reach. Will continue to monitor pt closely. Report received from P. Shawnie DapperLopez RN.

## 2014-08-05 NOTE — Anesthesia Preprocedure Evaluation (Signed)
Anesthesia Evaluation  Patient identified by MRN, date of birth, ID band Patient awake    Reviewed: Allergy & Precautions, NPO status , Patient's Chart, lab work & pertinent test results  History of Anesthesia Complications Negative for: history of anesthetic complications  Airway Mallampati: II  TM Distance: >3 FB Neck ROM: Full    Dental  (+) Teeth Intact   Pulmonary neg shortness of breath, neg sleep apnea, neg COPDneg recent URI, former smoker,  breath sounds clear to auscultation        Cardiovascular hypertension, Pt. on medications Rhythm:Regular     Neuro/Psych negative neurological ROS  negative psych ROS   GI/Hepatic Neg liver ROS, GERD-  Medicated and Controlled,  Endo/Other  diabetes, Type 2, Insulin DependentMorbid obesity  Renal/GU Renal InsufficiencyRenal disease     Musculoskeletal  (+) Arthritis -,   Abdominal   Peds  Hematology  (+) anemia ,   Anesthesia Other Findings   Reproductive/Obstetrics                             Anesthesia Physical Anesthesia Plan  ASA: III  Anesthesia Plan: MAC, Regional and Spinal   Post-op Pain Management:    Induction: Intravenous  Airway Management Planned: Natural Airway  Additional Equipment: None  Intra-op Plan:   Post-operative Plan:   Informed Consent: I have reviewed the patients History and Physical, chart, labs and discussed the procedure including the risks, benefits and alternatives for the proposed anesthesia with the patient or authorized representative who has indicated his/her understanding and acceptance.   Dental advisory given  Plan Discussed with: CRNA and Surgeon  Anesthesia Plan Comments:         Anesthesia Quick Evaluation

## 2014-08-05 NOTE — Progress Notes (Signed)
Orthopedic Tech Progress Note Patient Details:  Karen LoboMarian D Gardner [birth year] [id number]  CPM Right Knee CPM Right Knee: On Right Knee Flexion (Degrees): 60 Right Knee Extension (Degrees): 0  Ortho Devices Ortho Device/Splint Location: applied overhead frame to bed Ortho Device/Splint Interventions: Ordered, Application   Jennye MoccasinHughes, Aimee Heldman Craig 08/05/2014, 6:46 PM

## 2014-08-06 ENCOUNTER — Encounter (HOSPITAL_COMMUNITY): Payer: Self-pay | Admitting: General Practice

## 2014-08-06 LAB — GLUCOSE, CAPILLARY
GLUCOSE-CAPILLARY: 148 mg/dL — AB (ref 70–99)
GLUCOSE-CAPILLARY: 205 mg/dL — AB (ref 70–99)
Glucose-Capillary: 90 mg/dL (ref 70–99)
Glucose-Capillary: 166 mg/dL — ABNORMAL HIGH (ref 70–99)

## 2014-08-06 LAB — CBC
HCT: 30.1 % — ABNORMAL LOW (ref 36.0–46.0)
Hemoglobin: 9.3 g/dL — ABNORMAL LOW (ref 12.0–15.0)
MCH: 28.1 pg (ref 26.0–34.0)
MCHC: 30.9 g/dL (ref 30.0–36.0)
MCV: 90.9 fL (ref 78.0–100.0)
Platelets: 163 10*3/uL (ref 150–400)
RBC: 3.31 MIL/uL — ABNORMAL LOW (ref 3.87–5.11)
RDW: 14 % (ref 11.5–15.5)
WBC: 5.6 10*3/uL (ref 4.0–10.5)

## 2014-08-06 LAB — HEMOGLOBIN A1C
Hgb A1c MFr Bld: 6.8 % — ABNORMAL HIGH (ref 4.8–5.6)
Mean Plasma Glucose: 148 mg/dL

## 2014-08-06 MED ORDER — INSULIN PUMP
Freq: Three times a day (TID) | SUBCUTANEOUS | Status: DC
  Administered 2014-08-06 – 2014-08-07 (×3): via SUBCUTANEOUS
  Filled 2014-08-06: qty 1

## 2014-08-06 MED FILL — Insulin Aspart Inj 100 Unit/ML: SUBCUTANEOUS | Qty: 10 | Status: AC

## 2014-08-06 NOTE — Anesthesia Postprocedure Evaluation (Signed)
  Anesthesia Post-op Note  Patient: Karen Gardner  Procedure(s) Performed: Procedure(s): TOTAL KNEE ARTHROPLASTY (Right)  Patient Location: PACU  Anesthesia Type:General, Regional and Spinal  Level of Consciousness: awake  Airway and Oxygen Therapy: Patient Spontanous Breathing  Post-op Pain: mild  Post-op Assessment: Post-op Vital signs reviewed, Patient's Cardiovascular Status Stable, Respiratory Function Stable, Patent Airway, No signs of Nausea or vomiting and Pain level controlled  Post-op Vital Signs: Reviewed and stable  Last Vitals:  Filed Vitals:   08/06/14 0633  BP: 139/84  Pulse: 83  Temp: 36.6 C  Resp:     Complications: No apparent anesthesia complications

## 2014-08-06 NOTE — Plan of Care (Signed)
Problem: Consults Goal: Diagnosis- Total Joint Replacement Primary Total Knee     

## 2014-08-06 NOTE — Progress Notes (Signed)
Patient ID: Karen Gardner, female   DOB: 1955-07-22, 59 y.o.   MRN: [id number] PATIENT ID: Karen Gardner  MRN: [id number]  DOB/AGE:  1955-07-22 / 59 y.o.  1 Day Post-Op Procedure(s) (LRB): TOTAL KNEE ARTHROPLASTY (Right)    PROGRESS NOTE Subjective: Patient is alert, oriented, no Nausea, no Vomiting, yes passing gas, no Bowel Movement. Taking PO well. Denies SOB, Chest or Calf Pain. Using Incentive Spirometer, PAS in place. Ambulate WBAT  CPM 0-60 Patient reports pain as 4 on 0-10 scale  .    Objective: Vital signs in last 24 hours: Filed Vitals:   08/05/14 1555 08/05/14 1619 08/06/14 0431 08/06/14 0633  BP:  172/81 137/74 139/84  Pulse:  86 93 83  Temp: 97.8 F (36.6 C) 97.8 F (36.6 C) 98.2 F (36.8 C) 97.8 F (36.6 C)  TempSrc:  Oral Oral Oral  Resp:      Height:      Weight:      SpO2:  100% 99% 100%      Intake/Output from previous day: I/O last 3 completed shifts: In: 3014.2 [P.O.:75; I.V.:2829.2; IV Piggyback:110] Out: 150 [Blood:150]   Intake/Output this shift:     LABORATORY DATA:  Recent Labs  08/03/14 1524 08/03/14 1526  08/05/14 1729 08/05/14 2143 08/06/14 0435 08/06/14 0627  WBC 5.1  --   --   --   --  5.6  --   HGB 9.8*  --   --   --   --  9.3*  --   HCT 32.0*  --   --   --   --  30.1*  --   PLT 165  --   --   --   --  163  --   NA 139  --   --   --   --   --   --   K 4.1  --   --   --   --   --   --   CL 109  --   --   --   --   --   --   CO2 25  --   --   --   --   --   --   BUN 35*  --   --   --   --   --   --   CREATININE 1.60*  --   --   --   --   --   --   GLUCOSE 202*  --   --   --   --   --   --   GLUCAP  --   --   < > 116* 187*  --  148*  INR  --  1.03  --   --   --   --   --   CALCIUM 8.7*  --   --   --   --   --   --   < > = values in this interval not displayed.  Examination: Neurologically intact ABD soft Neurovascular intact Sensation intact distally Intact pulses distally Dorsiflexion/Plantar flexion  intact Incision: dressing C/D/I No cellulitis present Compartment soft}  Assessment:   1 Day Post-Op Procedure(s) (LRB): TOTAL KNEE ARTHROPLASTY (Right) ADDITIONAL DIAGNOSIS: Expected Acute Blood Loss Anemia, Diabetes  Plan: PT/OT WBAT, CPM 5/hrs day until ROM 0-90 degrees, then D/C CPM DVT Prophylaxis:  SCDx72hrs, ASA 325 mg BID x 2 weeks DISCHARGE PLAN: [facility] DISCHARGE NEEDS: HHPT, CPM, Dan HumphreysWalker  and 3-in-1 comode seat     Derald Lorge J 08/06/2014, 9:37 AM

## 2014-08-06 NOTE — Clinical Social Work Note (Signed)
Clinical Social Work Assessment  Patient Details  Name: Karen Gardner MRN: [id number] Date of Birth: 09/28/1955  Date of referral:  08/06/14               Reason for consult:  Facility Placement, Discharge Planning                Permission sought to share information with:  Other (None. Patient alert and oriented.) Permission granted to share information::  No  Housing/Transportation Living arrangements for the past 2 months:  Single Family Home Source of Information:  Patient Patient Interpreter Needed:  None Criminal Activity/Legal Involvement Pertinent to Current Situation/Hospitalization:  No - Comment as needed Significant Relationships:  Siblings, Parents Lives with:  Parents Do you feel safe going back to the place where you live?  Yes Need for family participation in patient care:  No (Coment)  Care giving concerns:  Patient expressed no concerns regarding discharge planning.   Social Worker assessment / plan:  CSW met with patient at bedside. Patient informed CSW patient has previously completed short-term rehabilitation at [facility] and would prefer to discharge back once medically stable for discharge. Per patient, patient lives with patient's mother. Patient's sister, Davy Pique, present at bedside throughout assessment. Patient's sister to care for patient's mother while patient is completing short-term rehabilitation.  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  Patient/Family's Understanding of and Emotional Response to Diagnosis, Current Treatment, and Prognosis:  Patient understanding and agreeable to CSW plan of care.  Emotional Assessment Appearance:  Appears younger than stated  age Attitude/Demeanor/Rapport:  Other (Patient pleasant during assessment.) Affect (typically observed):  Accepting, Appropriate, Pleasant Orientation:  Oriented to Self, Oriented to Place, Oriented to  Time, Oriented to Situation Alcohol / Substance use:  Not Applicable Psych involvement (Current and /or in the community):  No (Comment) (Not appropriate on this admission.)  Discharge Needs  Concerns to be addressed:  No discharge needs identified Readmission within the last 30 days:  No Current discharge risk:  None Barriers to Discharge:  No Barriers Identified   Caroline Sauger, LCSW 08/06/2014, 12:52 PM [phone]

## 2014-08-06 NOTE — Progress Notes (Signed)
OT Cancellation Note  Patient Details Name: Karen Gardner MRN: [id number] DOB: 10/25/1955   Cancelled Treatment:    Reason Eval/Treat Not Completed: Other (comment)  Current D/C plan is SNF. No apparent immediate acute care OT needs, therefore will defer OT to SNF. If OT eval is needed please call Acute Rehab Dept. at [phone] or text page OT at [phone].  [facility] Hospital[facility]ILLARY  Tyisha Cressy, OTR/L  [phone] 08/06/2014 08/06/2014, 1:04 PM

## 2014-08-06 NOTE — Progress Notes (Signed)
Inpatient Diabetes Program Recommendations  AACE/ADA: New Consensus Statement on Inpatient Glycemic Control (2013)  Target Ranges:  Prepandial:   less than 140 mg/dL      Peak postprandial:   less than 180 mg/dL (1-2 hours)      Critically ill patients:  140 - 180 mg/dL   Reason for Visit:  Patient has V-Go insulin delivery device.  She has supplies at the bedside and states the V-go is a "20" meaning it delivers 20 units of basal.  She then gives 4 units with each meal.  CBG's are well controlled.  Called and discussed with RN.  Patient does not need Lantus/Novolog orders since she has the V-Go insulin delivery device.  Thanks, Beryl MeagerJenny Raya Mckinstry, RN, BC-ADM Inpatient Diabetes Coordinator Pager [phone] (8a-5p)

## 2014-08-06 NOTE — Care Management Note (Signed)
Case Management Note  Patient Details  Name: Josiah LoboMarian D Ramus MRN: [id number] Date of Birth: Jul 28, 1955  Subjective/Objective:     Patient admitted with right knee osteoarthritis, underwent a right total knee arthroplasty.  Preoperatively setup with [facility] for HH. They will follow patient.              Action/Plan: Patient for shortterm rehab at SNF. [facility] go [facility].  Expected Discharge Date:  08/07/14               Expected Discharge Plan:  Skilled Nursing Facility  In-House Referral:  Clinical Social Work  Discharge planning Services  CM Consult  Post Acute Care Choice:  NA Choice offered to:  NA  DME Arranged:    DME Agency:     HH Arranged:    HH Agency:  [facility]  Status of Service:  In process, will continue to follow  Medicare Important Message Given:    Date Medicare IM Given:    Medicare IM give by:    Date Additional Medicare IM Given:    Additional Medicare Important Message give by:     If discussed at Long Length of Stay Meetings, dates discussed:    Additional Comments:  Durenda GuthrieBrady, Oriana Horiuchi Naomi, RN 08/06/2014, 3:40 PM

## 2014-08-06 NOTE — Evaluation (Signed)
Physical Therapy Evaluation Patient Details Name: Karen Gardner MRN: [id number] DOB: 1955/12/25 Today's Date: 08/06/2014   History of Present Illness  Pt is a 59 y/o F s/p R TKA.  Pt's PMH includes R ankle fxin 02/2014. DM type II uncontrolled, HTN, anxiety, SOB on exertion, anemia, and CKD.  Clinical Impression  Pt is s/p R TKA resulting in the deficits listed below (see PT Problem List). Pt limited by fatigue and pain in R knee this session.  Pt ambulated 20 ft in room to bathroom and back to recliner chair using RW this sesion.  Pt will benefit from skilled PT to increase their independence and safety with mobility to allow discharge to the venue listed below.      Follow Up Recommendations SNF;Supervision for mobility/OOB    Equipment Recommendations  None recommended by PT    Recommendations for Other Services       Precautions / Restrictions Precautions Precautions: Fall;Knee Precaution Booklet Issued: Yes (comment) Precaution Comments: Reviewed no pillow under knee Restrictions Weight Bearing Restrictions: Yes RLE Weight Bearing: Weight bearing as tolerated      Mobility  Bed Mobility Overal bed mobility: Needs Assistance Bed Mobility: Supine to Sit     Supine to sit: Min assist     General bed mobility comments: Assist w/ managing RLE to sitting EOB, pt w/ mod use of bed rails, cues for leg hook technique which pt demonstrated ability to perform.  Transfers Overall transfer level: Needs assistance Equipment used: Rolling walker (2 wheeled) Transfers: Sit to/from Stand Sit to Stand: Min guard         General transfer comment: Cues for proper hand placement and to scoot toward EOB prior to standing attempt.    Ambulation/Gait Ambulation/Gait assistance: Min guard Ambulation Distance (Feet): 20 Feet Assistive device: Rolling walker (2 wheeled) Gait Pattern/deviations: Step-to pattern;Shuffle;Decreased stride length;Decreased stance time -  right;Antalgic;Trunk flexed   Gait velocity interpretation: Below normal speed for age/gender General Gait Details: trunk flexed, step to gait, flat foot w/ L IC, cues to stand upright  Stairs            Wheelchair Mobility    Modified Rankin (Stroke Patients Only)       Balance Overall balance assessment: Needs assistance Sitting-balance support: Bilateral upper extremity supported;Feet supported Sitting balance-Leahy Scale: Fair     Standing balance support: Bilateral upper extremity supported;During functional activity Standing balance-Leahy Scale: Fair                               Pertinent Vitals/Pain Pain Assessment: 0-10 Pain Score: 6  Pain Location: R knee Pain Descriptors / Indicators: Sharp Pain Intervention(s): Limited activity within patient's tolerance;Monitored during session;Repositioned    Home Living Family/patient expects to be discharged to:: Skilled nursing facility [facility] Living Arrangements: Alone Available Help at Discharge: Family;Available PRN/intermittently Type of Home: House Home Access: Stairs to enter Entrance Stairs-Rails: None Entrance Stairs-Number of Steps: 1 Home Layout: Two level;Able to live on main level with bedroom/bathroom Home Equipment: Dan HumphreysWalker - 2 wheels;Transport chair;Wheelchair - Fluor Corporationmanual;Bedside commode;Shower seat Additional Comments: Pt's bed on the first floor is very low and pt has difficulty getting in/out of this bed.  Pt has some extended family that will be available to assist pt [facility]    Prior Function Level of Independence: Independent with assistive device(s)         Comments: Uses cane intermittently  when R knee started feeling weak     Hand Dominance   Dominant Hand: Right    Extremity/Trunk Assessment               Lower Extremity Assessment: RLE deficits/detail;Generalized weakness RLE Deficits / Details: weakness and limited ROM as  expected s/p R TKA       Communication   Communication: No difficulties  Cognition Arousal/Alertness: Awake/alert Behavior During Therapy: WFL for tasks assessed/performed Overall Cognitive Status: Within Functional Limits for tasks assessed                      General Comments General comments (skin integrity, edema, etc.): Pt has difficulty answering questions while ambulating due to focus required.    Exercises Total Joint Exercises Ankle Circles/Pumps: AROM;Both;10 reps;Supine Quad Sets: AROM;Both;10 reps;Supine Heel Slides: AROM;Right;5 reps;Supine Knee Flexion: AROM;AAROM;Right;5 reps;Seated Goniometric ROM: 5-94      Assessment/Plan    PT Assessment Patient needs continued PT services  PT Diagnosis Difficulty walking;Generalized weakness;Abnormality of gait;Acute pain   PT Problem List Decreased strength;Decreased range of motion;Decreased activity tolerance;Decreased balance;Decreased mobility;Decreased coordination;Decreased knowledge of use of DME;Decreased safety awareness;Decreased knowledge of precautions;Cardiopulmonary status limiting activity;Decreased skin integrity;Pain  PT Treatment Interventions DME instruction;Gait training;Stair training;Functional mobility training;Therapeutic activities;Therapeutic exercise;Balance training;Neuromuscular re-education;Patient/family education;Modalities   PT Goals (Current goals can be found in the Care Plan section) Acute Rehab PT Goals Patient Stated Goal: to go to rehab PT Goal Formulation: With patient Time For Goal Achievement: 08/13/14 Potential to Achieve Goals: Good    Frequency 7X/week   Barriers to discharge Inaccessible home environment;Decreased caregiver support Intermittent assist at home, 1 step to enter w/o rails    Co-evaluation               End of Session Equipment Utilized During Treatment: Gait belt Activity Tolerance: Patient tolerated treatment well;Patient limited by  fatigue Patient left: in chair;with call bell/phone within reach Nurse Communication: Mobility status;Precautions;Weight bearing status         Time: [id number] PT Time Calculation (min) (ACUTE ONLY): 32 min   Charges:   PT Evaluation $Initial PT Evaluation Tier I: 1 Procedure PT Treatments $Therapeutic Exercise: 8-22 mins   PT G Codes:       Michail JewelsAshley Parr PT, DPT [phone] Pager: [phone] 08/06/2014, 11:09 AM

## 2014-08-07 LAB — CBC
HCT: 25.5 % — ABNORMAL LOW (ref 36.0–46.0)
Hemoglobin: 8.1 g/dL — ABNORMAL LOW (ref 12.0–15.0)
MCH: 28.2 pg (ref 26.0–34.0)
MCHC: 31.8 g/dL (ref 30.0–36.0)
MCV: 88.9 fL (ref 78.0–100.0)
PLATELETS: 142 10*3/uL — AB (ref 150–400)
RBC: 2.87 MIL/uL — AB (ref 3.87–5.11)
RDW: 14.1 % (ref 11.5–15.5)
WBC: 6.5 10*3/uL (ref 4.0–10.5)

## 2014-08-07 LAB — GLUCOSE, CAPILLARY: GLUCOSE-CAPILLARY: 143 mg/dL — AB (ref 70–99)

## 2014-08-07 NOTE — Progress Notes (Signed)
Physical Therapy Treatment Patient Details Name: Karen Gardner MRN: [id number] DOB: [birth year] Today's Date: 08/07/2014    History of Present Illness Pt is a 59 y/o F s/p R TKA.  Pt's PMH includes R ankle fxin 02/2014. DM type II uncontrolled, HTN, anxiety, SOB on exertion, anemia, and CKD.    PT Comments    Pt demonstrated ability to ambulate 50 ft this session with one instance of R knee buckle 2/2 fatigue at end of ambulatory distance.  Pt is progressing w/ PT and will benefit from continued therapy to increase functional independence.   Follow Up Recommendations  SNF;Supervision for mobility/OOB     Equipment Recommendations  None recommended by PT    Recommendations for Other Services       Precautions / Restrictions Precautions Precautions: Fall;Knee Precaution Comments: Pt able to recall no pillow under knee Restrictions Weight Bearing Restrictions: Yes RLE Weight Bearing: Weight bearing as tolerated    Mobility  Bed Mobility Overal bed mobility: Modified Independent Bed Mobility: Supine to Sit     Supine to sit: Modified independent (Device/Increase time)     General bed mobility comments: Pt performs leg hook technique, requiring verbal cues for reminder of technique, mod use of bed rails, and increased time  Transfers Overall transfer level: Needs assistance Equipment used: Rolling walker (2 wheeled) Transfers: Sit to/from Stand Sit to Stand: Min assist;From elevated surface         General transfer comment: Min assist to power up from sitting, cues for hand placement  Ambulation/Gait Ambulation/Gait assistance: Min assist Ambulation Distance (Feet): 50 Feet Assistive device: Rolling walker (2 wheeled) Gait Pattern/deviations: Step-to pattern;Decreased stance time - right;Decreased weight shift to right;Antalgic;Trunk flexed   Gait velocity interpretation: Below normal speed for age/gender General Gait Details: trunk flexed, step to gait, very  slow gait speed, 1 knee buckle at end of ambulatory distance prior to sitting in recliner   Stairs            Wheelchair Mobility    Modified Rankin (Stroke Patients Only)       Balance Overall balance assessment: Needs assistance Sitting-balance support: Bilateral upper extremity supported;Feet supported Sitting balance-Leahy Scale: Fair     Standing balance support: Bilateral upper extremity supported;During functional activity Standing balance-Leahy Scale: Fair                      Cognition Arousal/Alertness: Awake/alert Behavior During Therapy: WFL for tasks assessed/performed Overall Cognitive Status: Within Functional Limits for tasks assessed                      Exercises Total Joint Exercises Ankle Circles/Pumps: AROM;Both;10 reps;Supine Quad Sets: AROM;Both;10 reps;Supine Long Arc Quad: AAROM;Right;10 reps;Seated Knee Flexion: AROM;AAROM;Right;5 reps;Seated Goniometric ROM: 2-96    General Comments        Pertinent Vitals/Pain Pain Assessment: 0-10 Pain Score: 8  Pain Location: R knee Pain Descriptors / Indicators: Aching;Grimacing;Guarding;Discomfort Pain Intervention(s): Limited activity within patient's tolerance;Monitored during session;Repositioned;Patient requesting pain meds-RN notified    Home Living                      Prior Function            PT Goals (current goals can now be found in the care plan section) Acute Rehab PT Goals Patient Stated Goal: to go to rehab Progress towards PT goals: Progressing toward goals    Frequency  7X/week    PT  Plan Current plan remains appropriate    Co-evaluation             End of Session Equipment Utilized During Treatment: Gait belt Activity Tolerance: Patient limited by fatigue Patient left: in chair;with call bell/phone within reach     Time: 0900-0928 PT Time Calculation (min) (ACUTE ONLY): 28 min  Charges:  $Gait Training: 8-22  mins $Therapeutic Exercise: 8-22 mins                    G CodesMichail Jewels:      Ashley Parr PT, [place]PT [phone] Pager: [phone] 08/07/2014, 12:00 PM

## 2014-08-07 NOTE — Discharge Summary (Signed)
Patient ID: Karen Gardner MRN: [id number] DOB/AGE: [birth year] 61 y.o.  Admit date: 08/05/2014 Discharge date: 08/07/2014  Admission Diagnoses:  Principal Problem:   Primary osteoarthritis of right knee Active Problems:   Primary osteoarthritis of knee   Discharge Diagnoses:  Same  Past Medical History  Diagnosis Date  . Hypertension   . Arthritis   . GERD (gastroesophageal reflux disease)   . Numbness in both hands     mostly at night  . Anxiety   . Diabetes mellitus without complication     Type 2 on insulin pump  . Shortness of breath dyspnea     with exertion  . Anemia     has had to have iron infusions-sees dr Jonette Eva  . Iron deficiency anemia   . CKD (chronic kidney disease)     Surgeries: Procedure(s): TOTAL KNEE ARTHROPLASTY on 08/05/2014   Consultants:    Discharged Condition: Improved  Hospital Course: Karen Gardner is an 59 y.o. female who was admitted 08/05/2014 for operative treatment ofPrimary osteoarthritis of right knee. Patient has severe unremitting pain that affects sleep, daily activities, and work/hobbies. After pre-op clearance the patient was taken to the operating room on 08/05/2014 and underwent  Procedure(s): TOTAL KNEE ARTHROPLASTY.    Patient was given perioperative antibiotics: Anti-infectives    Start     Dose/Rate Route Frequency Ordered Stop   08/05/14 1229  cefUROXime (ZINACEF) injection  Status:  Discontinued       As needed 08/05/14 1229 08/05/14 1400   08/05/14 0600  ceFAZolin (ANCEF) IVPB 2 g/50 mL premix     2 g 100 mL/hr over 30 Minutes Intravenous On call to O.R. 08/04/14 1233 08/05/14 1200       Patient was given sequential compression devices, early ambulation, and chemoprophylaxis to prevent DVT.  Patient benefited maximally from hospital stay and there were no complications.    Recent vital signs: Patient Vitals for the past 24 hrs:  BP Temp Temp src Pulse Resp SpO2  08/07/14 0518 (!) 148/85 mmHg 98.8 F (37.1 C)  Oral 90 17 99 %  08/06/14 2245 - 99.3 F (37.4 C) Oral - - -  08/06/14 1928 (!) 165/72 mmHg (!) 101.3 F (38.5 C) Oral 95 16 100 %  08/06/14 1705 137/72 mmHg 99.8 F (37.7 C) Oral (!) 112 16 100 %     Recent laboratory studies:  Recent Labs  08/06/14 0435 08/07/14 0436  WBC 5.6 6.5  HGB 9.3* 8.1*  HCT 30.1* 25.5*  PLT 163 142*     Discharge Medications:     Medication List    STOP taking these medications        meloxicam 15 MG tablet  Commonly known as:  MOBIC      TAKE these medications        aspirin EC 81 MG tablet  Take 81 mg by mouth daily.     aspirin EC 325 MG tablet  Take 1 tablet (325 mg total) by mouth 2 (two) times daily.     atorvastatin 40 MG tablet  Commonly known as:  LIPITOR  Take 40 mg by mouth daily at 6 PM.     CALCIUM PO  Take 1 capsule by mouth at bedtime.     escitalopram 10 MG tablet  Commonly known as:  LEXAPRO  Take 10 mg by mouth daily.     glucose blood test strip  Commonly known as:  ONETOUCH VERIO  Use as instructed to check blood sugar  once a day dx code E11.65     HUMALOG 100 UNIT/ML injection  Generic drug:  insulin lispro  20 Units by Pump Prime route daily.     insulin aspart 100 UNIT/ML injection  Commonly known as:  NOVOLOG  Use max 56 units per day with V-Go     lansoprazole 30 MG capsule  Commonly known as:  PREVACID  Take 30 mg by mouth daily.     levocetirizine 5 MG tablet  Commonly known as:  XYZAL  Take 5 mg by mouth daily.     methocarbamol 500 MG tablet  Commonly known as:  ROBAXIN  Take 1 tablet (500 mg total) by mouth 3 (three) times daily.     ONETOUCH DELICA LANCETS FINE Misc  Use to check blood sugar once a day dx code E11.65     OVER THE COUNTER MEDICATION  Apply 1 patch topically 3 (three) times daily as needed (pain). OTC pain patch     OxyCODONE 10 mg T12a 12 hr tablet  Commonly known as:  OXYCONTIN  TAKE 1 TABLET BY MOUTH TWICE DAILY FOR ANKLE FRACTURE S/P ORIF **DO NOT CRUSH**      oxyCODONE-acetaminophen 10-325 MG per tablet  Commonly known as:  PERCOCET  Take 0.5-1 tablets by mouth every 6 (six) hours as needed for pain. DO NOT EXCEED 4 GM OF TYLENOL IN 24 HOURS     polyethylene glycol packet  Commonly known as:  MIRALAX / GLYCOLAX  Take 17 g by mouth daily.     POTASSIUM PO  Take 1 tablet by mouth daily.     sennosides-docusate sodium 8.6-50 MG tablet  Commonly known as:  SENOKOT-S  Take 1 tablet by mouth daily.     traMADol 50 MG tablet  Commonly known as:  ULTRAM  Take 50-100 mg by mouth 3 (three) times daily as needed (pain).     V-GO 20 Kit  Use one per day     valsartan-hydrochlorothiazide 80-12.5 MG per tablet  Commonly known as:  DIOVAN-HCT  Take 1 tablet by mouth daily.     VOLTAREN 1 % Gel  Generic drug:  diclofenac sodium  Apply 1 application topically 2 (two) times daily as needed (inflammation).        Diagnostic Studies: No results found.  Disposition: 03-Skilled Nursing Facility      Discharge Instructions    CPM    Complete by:  As directed   Continuous passive motion machine (CPM):      Use the CPM from 0 to 60  for 5 hours per day.      You may increase by 10 degrees per day.  You may break it up into 2 or 3 sessions per day.      Use CPM for 2 weeks or until you are told to stop.     Call MD / Call 911    Complete by:  As directed   If you experience chest pain or shortness of breath, CALL 911 and be transported to the hospital emergency room.  If you develope a fever above 101 F, pus (white drainage) or increased drainage or redness at the wound, or calf pain, call your surgeon's office.     Change dressing    Complete by:  As directed   Change dressing on 5, then change the dressing daily with sterile 4 x 4 inch gauze dressing and apply TED hose.  You may clean the incision with alcohol prior to redressing.  Constipation Prevention    Complete by:  As directed   Drink plenty of fluids.  Prune juice may be  helpful.  You may use a stool softener, such as Colace (over the counter) 100 mg twice a day.  Use MiraLax (over the counter) for constipation as needed.     Diet - low sodium heart healthy    Complete by:  As directed      Discharge instructions    Complete by:  As directed   Follow up in office with Dr. Mayer Camel in 2 weeks.     Driving restrictions    Complete by:  As directed   No driving for 2 weeks     Increase activity slowly as tolerated    Complete by:  As directed      Patient may shower    Complete by:  As directed   You may shower without a dressing once there is no drainage.  Do not wash over the wound.  If drainage remains, cover wound with plastic wrap and then shower.           Follow-up Information    Follow up with Kerin Salen, MD In 2 weeks.   Specialty:  Orthopedic Surgery   Contact information:   [place] [address] [phone]        Signed: Theodosia Quay 08/07/2014, 7:43 AM

## 2014-08-07 NOTE — Clinical Social Work Placement (Signed)
   CLINICAL SOCIAL WORK PLACEMENT  NOTE  Date:  08/07/2014  Patient Details  Name: Karen LoboMarian D Preisler MRN: [id number] Date of Birth: 04/07/1955  Clinical Social Work is seeking post-discharge placement for this patient at the Skilled  Nursing Facility level of care (*CSW will initial, date and re-position this form in  chart as items are completed):  Yes   Patient/family provided with [facility] Department's list of facilities offering this level of care within the geographic area requested by the patient (or if unable, by the patient's family).  Yes   Patient/family informed of their freedom to choose among providers that offer the needed level of care, that participate in Medicare, Medicaid or managed care program needed by the patient, have an available bed and are willing to accept the patient.  Yes   Patient/family informed of [facility]'s ownership interest in [facility] and [facility], as well as of the fact that they are under no obligation to receive care at these facilities.  PASRR submitted to EDS on       PASRR number received on       Existing PASRR number confirmed on 08/06/14     FL2 transmitted to all facilities in geographic area requested by pt/family on 08/06/14     FL2 transmitted to all facilities within larger geographic area on       Patient informed that his/her managed care company has contracts with or will negotiate with certain facilities, including the following:        Yes   Patient/family informed of bed offers received.  Patient chooses bed at [facility]     Physician recommends and patient chooses bed at  (none)    Patient to be transferred to [facility] on 08/07/14.  Patient to be transferred to facility by [facility]     Patient family notified on 08/07/14 of transfer.  Name of family member notified:  patient is alert and oriented      PHYSICIAN       Additional Comment:     _______________________________________________ Vickii PennaGina Chianti Goh, LCSW [phone]  Psychiatric & Orthopedics (5N 1-8) Clinical Social Worker

## 2014-08-07 NOTE — Discharge Planning (Signed)
Patient will discharge today per MD order. Patient will discharge to [facility] RN to call report prior to transportation to: [phone] Transportation: [facility]- to be scheduled after 1pm  CSW sent discharge summary to SNF for review.  Packet is complete.  RN, patient and family aware of discharge plans.  Vickii PennaGina Lenita Peregrina, LCSWA [phone]  Psychiatric & Orthopedics (5N 1-16) Clinical Social Worker

## 2014-08-07 NOTE — Progress Notes (Signed)
PATIENT ID: Karen Gardner  MRN: [id number]  DOB/AGE:  October 18, 1955 / 59 y.o.  2 Days Post-Op Procedure(s) (LRB): TOTAL KNEE ARTHROPLASTY (Right)    PROGRESS NOTE Subjective: Patient is alert, oriented, no Nausea, no Vomiting, yes passing gas, no Bowel Movement. Taking PO well with pt up in bed eating. Denies SOB, Chest or Calf Pain. Using Incentive Spirometer, PAS in place. Ambulate WBAT with pt ambulating 20 ft with therapy , CPM 0-40 Patient reports pain as 7 on 0-10 scale  .    Objective: Vital signs in last 24 hours: Filed Vitals:   08/06/14 1705 08/06/14 1928 08/06/14 2245 08/07/14 0518  BP: 137/72 165/72  148/85  Pulse: 112 95  90  Temp: 99.8 F (37.7 C) 101.3 F (38.5 C) 99.3 F (37.4 C) 98.8 F (37.1 C)  TempSrc: Oral Oral Oral Oral  Resp: 16 16  17   Height:      Weight:      SpO2: 100% 100%  99%      Intake/Output from previous day: I/O last 3 completed shifts: In: 2674.2 [P.O.:795; I.V.:1879.2] Out: -    Intake/Output this shift:     LABORATORY DATA:  Recent Labs  08/06/14 0435  08/06/14 1629 08/06/14 2159 08/07/14 0436 08/07/14 0631  WBC 5.6  --   --   --  6.5  --   HGB 9.3*  --   --   --  8.1*  --   HCT 30.1*  --   --   --  25.5*  --   PLT 163  --   --   --  142*  --   GLUCAP  --   < > 205* 166*  --  143*  < > = values in this interval not displayed.  Examination: Neurologically intact Neurovascular intact Sensation intact distally Intact pulses distally Dorsiflexion/Plantar flexion intact Incision: dressing C/D/I No cellulitis present Compartment soft}  Assessment:   2 Days Post-Op Procedure(s) (LRB): TOTAL KNEE ARTHROPLASTY (Right) ADDITIONAL DIAGNOSIS: Expected Acute Blood Loss Anemia, Diabetes  Plan: PT/OT WBAT, CPM 5/hrs day until ROM 0-90 degrees, then D/C CPM DVT Prophylaxis:  SCDx72hrs, ASA 325 mg BID x 2 weeks DISCHARGE PLAN: Skilled Nursing Facility/Rehab, [facility] when bed available DISCHARGE NEEDS: HHPT, HHRN, CPM,  Walker and 3-in-1 comode seat     PHILLIPS, ERIC R 08/07/2014, 7:39 AM

## 2014-08-08 LAB — GLUCOSE, CAPILLARY: Glucose-Capillary: 191 mg/dL — ABNORMAL HIGH (ref 70–99)

## 2014-08-10 ENCOUNTER — Encounter: Payer: Self-pay | Admitting: Registered Nurse

## 2014-08-10 ENCOUNTER — Non-Acute Institutional Stay (SKILLED_NURSING_FACILITY): Payer: 59 | Admitting: Registered Nurse

## 2014-08-10 DIAGNOSIS — M1711 Unilateral primary osteoarthritis, right knee: Secondary | ICD-10-CM

## 2014-08-10 DIAGNOSIS — E785 Hyperlipidemia, unspecified: Secondary | ICD-10-CM

## 2014-08-10 DIAGNOSIS — E1140 Type 2 diabetes mellitus with diabetic neuropathy, unspecified: Secondary | ICD-10-CM | POA: Diagnosis not present

## 2014-08-10 DIAGNOSIS — J309 Allergic rhinitis, unspecified: Secondary | ICD-10-CM

## 2014-08-10 DIAGNOSIS — N183 Chronic kidney disease, stage 3 unspecified: Secondary | ICD-10-CM

## 2014-08-10 DIAGNOSIS — K219 Gastro-esophageal reflux disease without esophagitis: Secondary | ICD-10-CM

## 2014-08-10 DIAGNOSIS — F329 Major depressive disorder, single episode, unspecified: Secondary | ICD-10-CM

## 2014-08-10 DIAGNOSIS — D62 Acute posthemorrhagic anemia: Secondary | ICD-10-CM

## 2014-08-10 DIAGNOSIS — K5900 Constipation, unspecified: Secondary | ICD-10-CM | POA: Diagnosis not present

## 2014-08-10 DIAGNOSIS — F32A Depression, unspecified: Secondary | ICD-10-CM

## 2014-08-10 DIAGNOSIS — I10 Essential (primary) hypertension: Secondary | ICD-10-CM | POA: Diagnosis not present

## 2014-08-10 NOTE — Progress Notes (Signed)
Patient ID: Karen Gardner, female   DOB: [birth year], 59 y.o.   MRN: [id number]   Place of Service: [facility]  Allergies  Allergen Reactions  . Lactose Intolerance (Gi)   . Oatmeal Other (See Comments)    "Gas"     Code Status: Full Code  Goals of Care: Longevity/STR  Chief Complaint  Patient presents with  . Hospitalization Follow-up    HPI 59 y.o. female with PMH of left ankle fx, DM2 on insulin pump, OA, CKD, iron deficiency anemia, GERD among other is being seen for a follow-up visit post hospital admission from 08/05/14 to 08/17/14 for right knee arthritis s/p total right knee arthroplasty. She tolerated the procedure well. She is here for short term rehab and her goal is to return home. Seen in therapy room today. Reported pain is adequately controlled with current regimen, but is constipated-would like to have something to help her go today. Denies any other concerns.   Review of Systems Constitutional: Negative for fever, chills, and fatigue. HENT: Negative for ear pain, congestion, and sore throat Eyes: Negative for eye pain, eye discharge, and visual disturbance  Cardiovascular: Negative for chest pain and palpitation Respiratory: Negative cough, shortness of breath, and wheezing.  Gastrointestinal: Negative for nausea and vomiting. Negative for abdominal pain. Positive for constipation  Genitourinary: Negative for  dysuria Endocrine: Negative for polydipsia, polyphagia, and polyuria Musculoskeletal: Negative for uncontrolled pain Neurological: Negative for dizziness and headache Skin: Negative for rash and and itchiness Psychiatric: Negative for depression   Past Medical History  Diagnosis Date  . Hypertension   . Arthritis   . GERD (gastroesophageal reflux disease)   . Numbness in both hands     mostly at night  . Anxiety   . Diabetes mellitus without complication     Type 2 on insulin pump  . Shortness of breath dyspnea     with exertion  .  Anemia     has had to have iron infusions-sees dr Jonette Eva  . Iron deficiency anemia   . CKD (chronic kidney disease)     Past Surgical History  Procedure Laterality Date  . Hernia repair      umb hernia as child  . Colonoscopy    . Upper gi endoscopy    . Knee arthroscopy  02/12/2012    Procedure: ARTHROSCOPY KNEE;  Surgeon: Kerin Salen, MD;  Location: [place];  Service: Orthopedics;  Laterality: Right;  Partial Lateral Meniscectomy, Debridement chondromalacia  . Orif ankle fracture Right 02/11/2014    Procedure: OPEN REDUCTION INTERNAL FIXATION (ORIF) RIGHT ANKLE FRACTURE;  Surgeon: Kerin Salen, MD;  Location: [place];  Service: Orthopedics;  Laterality: Right;  . Eye surgery Bilateral     Lazer   . Total knee arthroplasty Right 08/05/2014  . Total knee arthroplasty Right 08/05/2014    Procedure: TOTAL KNEE ARTHROPLASTY;  Surgeon: Frederik Pear, MD;  Location: [place];  Service: Orthopedics;  Laterality: Right;    History  Substance Use Topics  . Smoking status: Former Smoker    Quit date: 02/08/1972  . Smokeless tobacco: Never Used  . Alcohol Use: No      Medication List       This list is accurate as of: 08/10/14  1:02 PM.  Always use your most recent med list.               aspirin EC 325 MG tablet  Take 1 tablet (325 mg total) by  mouth 2 (two) times daily.     atorvastatin 40 MG tablet  Commonly known as:  LIPITOR  Take 40 mg by mouth daily at 6 PM.     CALCIUM PO  Take 1 capsule by mouth at bedtime.     escitalopram 10 MG tablet  Commonly known as:  LEXAPRO  Take 10 mg by mouth daily.     glucose blood test strip  Commonly known as:  ONETOUCH VERIO  Use as instructed to check blood sugar once a day dx code E11.65     insulin lispro 100 UNIT/ML injection  Commonly known as:  HUMALOG  Inject 4-6 Units into the skin 3 (three) times daily before meals. 4 units with breakfast, 4 units with lunch, and 6 units with dinner via self-managed insulin  pump     insulin lispro 100 UNIT/ML injection  Commonly known as:  HUMALOG  20 Units by Pump Prime route daily.     lansoprazole 30 MG capsule  Commonly known as:  PREVACID  Take 30 mg by mouth daily.     levocetirizine 5 MG tablet  Commonly known as:  XYZAL  Take 5 mg by mouth daily.     methocarbamol 500 MG tablet  Commonly known as:  ROBAXIN  Take 1 tablet (500 mg total) by mouth 3 (three) times daily.     ONETOUCH DELICA LANCETS FINE Misc  Use to check blood sugar once a day dx code E11.65     OxyCODONE 10 mg T12a 12 hr tablet  Commonly known as:  OXYCONTIN  TAKE 1 TABLET BY MOUTH TWICE DAILY FOR ANKLE FRACTURE S/P ORIF **DO NOT CRUSH**     oxyCODONE-acetaminophen 10-325 MG per tablet  Commonly known as:  PERCOCET  Take 0.5-1 tablets by mouth every 6 (six) hours as needed for pain. DO NOT EXCEED 4 GM OF TYLENOL IN 24 HOURS     polyethylene glycol packet  Commonly known as:  MIRALAX / GLYCOLAX  Take 17 g by mouth daily.     saxagliptin HCl 2.5 MG Tabs tablet  Commonly known as:  ONGLYZA  Take 5 mg by mouth daily.     sennosides-docusate sodium 8.6-50 MG tablet  Commonly known as:  SENOKOT-S  Take 1 tablet by mouth daily.     traMADol 50 MG tablet  Commonly known as:  ULTRAM  Take 50-100 mg by mouth 3 (three) times daily as needed (pain).     V-GO 20 Kit  Use one per day     valsartan-hydrochlorothiazide 80-12.5 MG per tablet  Commonly known as:  DIOVAN-HCT  Take 1 tablet by mouth daily.     VOLTAREN 1 % Gel  Generic drug:  diclofenac sodium  Apply 1 application topically 2 (two) times daily as needed (inflammation).        Physical Exam  BP 150/62 mmHg  Pulse 96  Temp(Src) 97.8 F (36.6 C)  Resp 17  Ht $R'5\' 5"'IU$  (1.651 m)  Wt 243 lb 12.8 oz (110.587 kg)  BMI 40.57 kg/m2  Constitutional: WDWN adult female in no acute distress. Conversant and pleasant HEENT: Normocephalic and atraumatic. PERRL. EOM intact. No icterus. Oral mucosa moist. Posterior  pharynx clear of any exudate or lesions. Teeth and gingiva in good general condition.  Neck: Supple and nontender. No lymphadenopathy, masses, or thyromegaly. No JVD or carotid bruits. Cardiac: Normal S1, S2. RRR without appreciable murmurs, rubs, or gallops. Distal pulses intact.  Trace dependent edema.  Respiratory: Unlabored respiration. Breath sounds  clear bilaterally without rales, rhonchi, or wheezes. GI: Audible bowel sounds in all quadrants. Soft, nontender, nondistended.  Musculoskeletal: s/p right knee replacement. Right knee edematous with surgical dressing dry and intact.   Skin: Warm and dry. No rash noted. Neurological: Alert and oriented to person, place, and time. No focal deficits.  Psychiatric: Judgment and insight adequate. Appropriate mood and affect.   Labs Reviewed  CBC Latest Ref Rng 08/07/2014 08/06/2014 08/03/2014  WBC 4.0 - 10.5 K/uL 6.5 5.6 5.1  Hemoglobin 12.0 - 15.0 g/dL 8.1(L) 9.3(L) 9.8(L)  Hematocrit 36.0 - 46.0 % 25.5(L) 30.1(L) 32.0(L)  Platelets 150 - 400 K/uL 142(L) 163 165    CMP Latest Ref Rng 08/03/2014 02/12/2014 02/11/2014  Glucose 70 - 99 mg/dL 202(H) 262(H) 544(H)  BUN 6 - 20 mg/dL 35(H) 33(H) 35(H)  Creatinine 0.44 - 1.00 mg/dL 1.60(H) 1.28(H) 1.30(H)  Sodium 135 - 145 mmol/L 139 141 135(L)  Potassium 3.5 - 5.1 mmol/L 4.1 4.2 4.7  Chloride 101 - 111 mmol/L 109 103 98  CO2 22 - 32 mmol/L 25 26 24   Calcium 8.9 - 10.3 mg/dL 8.7(L) 8.9 8.8  Total Protein 6.0 - 8.3 g/dL - 6.6 -  Total Bilirubin 0.3 - 1.2 mg/dL - 0.3 -  Alkaline Phos 39 - 117 U/L - 74 -  AST 0 - 37 U/L - 18 -  ALT 0 - 35 U/L - 14 -    Lab Results  Component Value Date   HGBA1C 6.8* 08/03/2014   Assessment & Plan 1. Primary osteoarthritis of right knee S/p Right TKA. Pain is adequately controlled with current regimen. Continue oxycontin 63m every 12 hours with percocet 10/325 1/2 to 1 tab every six hours as needed for pain and tramadol 576m1-2 tabs every 8 hours as needed for  pain. Continue robaxin 50080mhree times daily for muscle spasms and aspirin 325m91mice daily for DVT prophylaxis. Continue to work with PT/OT for gait/strength/balance training to restore/maximize function and f/u with ortho.   2. Essential hypertension, benign Elevated today. Continue valsartan-hctz 80/12.5mg 55mly for now. Monitor VS qshift   3. Depression Continue lexapro 10mg 67my. Monitor for change in mood.   4. Constipation, unspecified constipation type Dulcolax 5mg x 73mow then daily prn moderate constipation. Continue colace, senokot, and miralax. Encourage hydration and ambulation as tolerated  5. Gastroesophageal reflux disease without esophagitis Continue prevacid 30mg da23m 6. Acute blood loss anemia Most likely post op. Last hgb 8.1. Continue to monitor h&h  7. Type 2 diabetes mellitus with diabetic neuropathy Last a1c 6.8. Continue saxagliptin 5mg dail3mith self-managed insulin pump. Continue to monitor cbgs  8. Allergic rhinitis, unspecified allergic rhinitis type Continue xyzal 5mg daily20m. CKD (chronic kidney disease) stage 3, GFR 30-59 ml/min Avoid nephrotoxic agents, especially NSAIDs. Continue to monitor renal function  10. HLD. Continue lipitor 40mg daily83mDiagnostic Studies/Labs Ordered: cbc, bmp in 1 week  Family/Staff Communication Plan of care discussed with patient and nursing staff. Patient and nursing staff verbalized understanding and agree with plan of care. No additional questions or concerns reported.    Daeton Kluth,Arthur Holms-C [facility] [address] [phone] hours: [phone]

## 2014-08-11 LAB — TYPE AND SCREEN
ABO/RH(D): O POS
Antibody Screen: NEGATIVE
UNIT DIVISION: 0
Unit division: 0

## 2014-08-12 ENCOUNTER — Other Ambulatory Visit: Payer: Self-pay

## 2014-08-12 DIAGNOSIS — S82891A Other fracture of right lower leg, initial encounter for closed fracture: Secondary | ICD-10-CM

## 2014-08-12 MED ORDER — OXYCODONE-ACETAMINOPHEN 10-325 MG PO TABS: 1.0000 | ORAL_TABLET | Freq: Four times a day (QID) | ORAL | Status: DC | PRN

## 2014-08-12 NOTE — Telephone Encounter (Signed)
Rx faxed to [facility] @ [phone], phone number [phone]  

## 2014-08-13 ENCOUNTER — Non-Acute Institutional Stay (SKILLED_NURSING_FACILITY): Payer: 59 | Admitting: Internal Medicine

## 2014-08-13 DIAGNOSIS — T402X5A Adverse effect of other opioids, initial encounter: Secondary | ICD-10-CM | POA: Diagnosis not present

## 2014-08-13 DIAGNOSIS — D62 Acute posthemorrhagic anemia: Secondary | ICD-10-CM | POA: Diagnosis not present

## 2014-08-13 DIAGNOSIS — K5909 Other constipation: Secondary | ICD-10-CM | POA: Diagnosis not present

## 2014-08-13 DIAGNOSIS — E1122 Type 2 diabetes mellitus with diabetic chronic kidney disease: Secondary | ICD-10-CM | POA: Diagnosis not present

## 2014-08-13 DIAGNOSIS — N189 Chronic kidney disease, unspecified: Secondary | ICD-10-CM | POA: Diagnosis not present

## 2014-08-13 DIAGNOSIS — K5903 Drug induced constipation: Secondary | ICD-10-CM

## 2014-08-13 DIAGNOSIS — I10 Essential (primary) hypertension: Secondary | ICD-10-CM

## 2014-08-13 DIAGNOSIS — F322 Major depressive disorder, single episode, severe without psychotic features: Secondary | ICD-10-CM | POA: Diagnosis not present

## 2014-08-13 DIAGNOSIS — F329 Major depressive disorder, single episode, unspecified: Secondary | ICD-10-CM

## 2014-08-13 DIAGNOSIS — M1711 Unilateral primary osteoarthritis, right knee: Secondary | ICD-10-CM

## 2014-08-13 DIAGNOSIS — K219 Gastro-esophageal reflux disease without esophagitis: Secondary | ICD-10-CM

## 2014-08-14 NOTE — Progress Notes (Signed)
Patient ID: Karen Gardner, female   DOB: July 12, 1955, 59 y.o.   MRN: [id number]     Facility: [facility]    PCP: Leeroy Cha  Code Status: full code  Allergies  Allergen Reactions  . Lactose Intolerance (Gi)   . Oatmeal Other (See Comments)    "Gas"     Chief Complaint  Patient presents with  . New Admit To SNF     HPI:  59 year old patient is here for short term rehabilitation post hospital admission from 08/05/14 - 08/17/14 with right knee arthritis. She underwent right total knee arthroplasty. Her pain is under control. She has not had a bowel movement since Monday. She would like something to help with her bowel movement. she has PMH of DM2 on insulin pump, OA, CKD, iron deficiency anemia, GERD.  Review of Systems:  Constitutional: Negative for fever, chills, diaphoresis.  HENT: Negative for headache, congestion, nasal discharge Eyes: Negative for eye pain, blurred vision, double vision and discharge.  Respiratory: Negative for cough, shortness of breath and wheezing.   Cardiovascular: Negative for chest pain, palpitations, leg swelling.  Gastrointestinal: Negative for heartburn, nausea, vomiting, abdominal pain Genitourinary: Negative for dysuria  Musculoskeletal: Negative for back pain, falls Skin: Negative for itching, rash.  Neurological: Negative for dizziness, tingling, focal weakness Psychiatric/Behavioral: Negative for depression   Past Medical History  Diagnosis Date  . Hypertension   . Arthritis   . GERD (gastroesophageal reflux disease)   . Numbness in both hands     mostly at night  . Anxiety   . Diabetes mellitus without complication     Type 2 on insulin pump  . Shortness of breath dyspnea     with exertion  . Anemia     has had to have iron infusions-sees dr Jonette Eva  . Iron deficiency anemia   . CKD (chronic kidney disease)     Past Surgical History  Procedure Laterality Date  . Hernia repair      umb  hernia as child  . Colonoscopy    . Upper gi endoscopy    . Knee arthroscopy  02/12/2012    Procedure: ARTHROSCOPY KNEE;  Surgeon: Kerin Salen, MD;  Location: [place];  Service: Orthopedics;  Laterality: Right;  Partial Lateral Meniscectomy, Debridement chondromalacia  . Orif ankle fracture Right 02/11/2014    Procedure: OPEN REDUCTION INTERNAL FIXATION (ORIF) RIGHT ANKLE FRACTURE;  Surgeon: Kerin Salen, MD;  Location: [place];  Service: Orthopedics;  Laterality: Right;  . Eye surgery Bilateral     [facility]azer   . Total knee arthroplasty Right 08/05/2014  . Total knee arthroplasty Right 08/05/2014    Procedure: TOTAL KNEE ARTHROPLASTY;  Surgeon: Frederik Pear, MD;  Location: [place];  Service: Orthopedics;  Laterality: Right;    History  Substance Use Topics  . Smoking status: Former Smoker    Quit date: 02/08/1972  . Smokeless tobacco: Never Used  . Alcohol Use: No      Medication List       This list is accurate as of: 08/13/14 11:59 PM.  Always use your most recent med list.               aspirin EC 325 MG tablet  Take 1 tablet (325 mg total) by mouth 2 (two) times daily.     atorvastatin 40 MG tablet  Commonly known as:  LIPITOR  Take 40 mg by mouth daily at 6 PM.  bisacodyl 5 MG EC tablet  Commonly known as:  DULCOLAX  Take 5 mg by mouth daily as needed for moderate constipation.     CALCIUM PO  Take 1 capsule by mouth at bedtime.     docusate sodium 100 MG capsule  Commonly known as:  COLACE  Take 100 mg by mouth 2 (two) times daily.     escitalopram 10 MG tablet  Commonly known as:  LEXAPRO  Take 10 mg by mouth daily.     glucose blood test strip  Commonly known as:  ONETOUCH VERIO  Use as instructed to check blood sugar once a day dx code E11.65     insulin lispro 100 UNIT/ML injection  Commonly known as:  HUMALOG  Inject 4-6 Units into the skin 3 (three) times daily before meals. 4 units with breakfast, 4 units with lunch, and 6 units  with dinner via self-managed insulin pump     insulin lispro 100 UNIT/ML injection  Commonly known as:  HUMALOG  20 Units by Pump Prime route daily.     lansoprazole 30 MG capsule  Commonly known as:  PREVACID  Take 30 mg by mouth daily.     levocetirizine 5 MG tablet  Commonly known as:  XYZAL  Take 5 mg by mouth daily.     methocarbamol 500 MG tablet  Commonly known as:  ROBAXIN  Take 1 tablet (500 mg total) by mouth 3 (three) times daily.     ONETOUCH DELICA LANCETS FINE Misc  Use to check blood sugar once a day dx code E11.65     OxyCODONE 10 mg T12a 12 hr tablet  Commonly known as:  OXYCONTIN  TAKE 1 TABLET BY MOUTH TWICE DAILY FOR ANKLE FRACTURE S/P ORIF **DO NOT CRUSH**     oxyCODONE-acetaminophen 10-325 MG per tablet  Commonly known as:  PERCOCET  Take 1 tablet by mouth every 6 (six) hours as needed for pain. DO NOT EXCEED 4 GM OF TYLENOL IN 24 HOURS     polyethylene glycol packet  Commonly known as:  MIRALAX / GLYCOLAX  Take 17 g by mouth daily.     saxagliptin HCl 2.5 MG Tabs tablet  Commonly known as:  ONGLYZA  Take 5 mg by mouth daily.     sennosides-docusate sodium 8.6-50 MG tablet  Commonly known as:  SENOKOT-S  Take 2 tablets by mouth daily.     traMADol 50 MG tablet  Commonly known as:  ULTRAM  Take 50-100 mg by mouth 3 (three) times daily as needed (pain).     V-GO 20 Kit  Use one per day     valsartan-hydrochlorothiazide 80-12.5 MG per tablet  Commonly known as:  DIOVAN-HCT  Take 1 tablet by mouth daily.     VOLTAREN 1 % Gel  Generic drug:  diclofenac sodium  Apply 1 application topically 2 (two) times daily as needed (inflammation).        Physical Exam: Filed Vitals:   08/13/14 1549  BP: 132/78  Pulse: 88  Temp: 97 F (36.1 C)  Resp: 18  SpO2: 95%    General- elderly female, in no acute distress Head- normocephalic, atraumatic Throat- moist mucus membrane Eyes- PERRLA, EOMI, no pallor, no icterus, no discharge, normal  conjunctiva, normal sclera Neck- no cervical lymphadenopathy Cardiovascular- normal s1,s2, no murmurs, palpable dorsalis pedis, 1+ right leg edema and trace left leg edema Respiratory- bilateral clear to auscultation, no wheeze, no rhonchi, no crackles, no use of accessory muscles Abdomen- bowel  sounds present, soft, non tender Musculoskeletal- able to move all 4 extremities, right leg ROM limited  Neurological- no focal deficit Skin- warm and dry, right knee dressing in place- clean and dry Psychiatry- alert and oriented to person, place and time, normal mood and affect    Labs reviewed: Basic Metabolic Panel:  Recent Labs  02/11/14 2310 02/12/14 0510 08/03/14 1524  NA 135* 141 139  K 4.7 4.2 4.1  CL 98 103 109  CO2 24 26 25   GLUCOSE 544* 262* 202*  BUN 35* 33* 35*  CREATININE 1.30* 1.28* 1.60*  CALCIUM 8.8 8.9 8.7*   Liver Function Tests:  Recent Labs  02/12/14 0510  AST 18  ALT 14  ALKPHOS 74  BILITOT 0.3  PROT 6.6  ALBUMIN 2.8*   No results for input(s): LIPASE, AMYLASE in the last 8760 hours. No results for input(s): AMMONIA in the last 8760 hours. CBC:  Recent Labs  02/12/14 0510  08/03/14 1524 08/06/14 0435 08/07/14 0436  WBC 5.7  < > 5.1 5.6 6.5  NEUTROABS 3.9  --  3.3  --   --   HGB 8.6*  < > 9.8* 9.3* 8.1*  HCT 27.3*  < > 32.0* 30.1* 25.5*  MCV 91.9  --  90.1 90.9 88.9  PLT 176  < > 165 163 142*  < > = values in this interval not displayed. Cardiac Enzymes: No results for input(s): CKTOTAL, CKMB, CKMBINDEX, TROPONINI in the last 8760 hours. BNP: Invalid input(s): POCBNP CBG:  Recent Labs  08/06/14 2159 08/07/14 0631 08/07/14 1255  GLUCAP 166* 143* 191*     Assessment/Plan  Primary osteoarthritis of right knee S/p Right total knee arthroplasty. Will have her work with physical therapy and occupational therapy team to help with gait training and muscle strengthening exercises.fall precautions. Skin care. Encourage to be out of bed.   Continue oxycontin 38m every 12 hours with percocet 10/325 1/2 to 1 tab every six hours as needed for pain and tramadol 580m1-2 tabs every 8 hours as needed for pain. Continue robaxin 50026mhree times daily for muscle spasms. Continue aspirin 325m55mice daily for DVT prophylaxis. Has follow up with orthopedics.  Constipation Opioid induced. Start amitiza 24 mcg bid, d/c colace. Continue senna s and miralax.   Acute blood loss anemia Post op, monitor h&h  Essential hypertension stable readings. Continue valsartan-hctz 80/12.5mg 59mly for now.   Type 2 diabetes mellitus with renal disease Last a1c 6.8. Continue saxagliptin 5mg d64my with self-managed insulin pump. Continue to monitor cbg. Continue ARB and statin  gerd Stable. Continue prevacid 30 mg daily  Depression Continue lexapro 10mg d61m.   Goals of care: short term rehabilitation   Labs/tests ordered: cbc, bmp  Family/ staff Communication: reviewed care plan with patient and nursing supervisor    Medhansh Brinkmeier Blanchie ServeiedmonStockton [facility] [phone]-Friday 8 am - 5 pm) [phone])

## 2014-08-17 LAB — BASIC METABOLIC PANEL
BUN: 14 mg/dL (ref 4–21)
Creatinine: 1.4 mg/dL — AB (ref 0.5–1.1)
Potassium: 4 mmol/L (ref 3.4–5.3)
SODIUM: 141 mmol/L (ref 137–147)

## 2014-08-17 LAB — CBC AND DIFFERENTIAL
HCT: 25 % — AB (ref 36–46)
Hemoglobin: 8 g/dL — AB (ref 12.0–16.0)
PLATELETS: 334 10*3/uL (ref 150–399)
WBC: 4.8 10*3/mL

## 2014-08-20 ENCOUNTER — Encounter: Payer: Self-pay | Admitting: Registered Nurse

## 2014-08-20 ENCOUNTER — Non-Acute Institutional Stay (SKILLED_NURSING_FACILITY): Payer: 59 | Admitting: Registered Nurse

## 2014-08-20 DIAGNOSIS — K219 Gastro-esophageal reflux disease without esophagitis: Secondary | ICD-10-CM

## 2014-08-20 DIAGNOSIS — K5900 Constipation, unspecified: Secondary | ICD-10-CM | POA: Diagnosis not present

## 2014-08-20 DIAGNOSIS — J309 Allergic rhinitis, unspecified: Secondary | ICD-10-CM

## 2014-08-20 DIAGNOSIS — F329 Major depressive disorder, single episode, unspecified: Secondary | ICD-10-CM

## 2014-08-20 DIAGNOSIS — M1711 Unilateral primary osteoarthritis, right knee: Secondary | ICD-10-CM

## 2014-08-20 DIAGNOSIS — D62 Acute posthemorrhagic anemia: Secondary | ICD-10-CM

## 2014-08-20 DIAGNOSIS — E785 Hyperlipidemia, unspecified: Secondary | ICD-10-CM | POA: Diagnosis not present

## 2014-08-20 DIAGNOSIS — E1140 Type 2 diabetes mellitus with diabetic neuropathy, unspecified: Secondary | ICD-10-CM | POA: Diagnosis not present

## 2014-08-20 DIAGNOSIS — I10 Essential (primary) hypertension: Secondary | ICD-10-CM | POA: Diagnosis not present

## 2014-08-20 DIAGNOSIS — N183 Chronic kidney disease, stage 3 unspecified: Secondary | ICD-10-CM

## 2014-08-20 DIAGNOSIS — F32A Depression, unspecified: Secondary | ICD-10-CM

## 2014-08-20 NOTE — Progress Notes (Signed)
Patient ID: Karen Gardner, female   DOB: 1955-09-02, 59 y.o.   MRN: [id number]   Place of Service: [facility]  Allergies  Allergen Reactions  . Lactose Intolerance (Gi)   . Oatmeal Other (See Comments)    "Gas"     Code Status: Full Code  Goals of Care: Longevity/STR  Chief Complaint  Patient presents with  . Discharge Note    HPI 59 y.o. female with PMH of left ankle fx, DM2 on insulin pump, OA, CKD, iron deficiency anemia, GERD among other is being seen for a discharge visit. She was here for short term rehab post hospital admission from 08/05/14 to 08/17/14 for right knee arthritis s/p total right knee arthroplasty. She has worked well with therapy team and is stable to be discharged home with outpatient therapy and DME (4-prong walker).  Review of Systems Constitutional: Negative for fever, chills, and fatigue. HENT: Negative for ear pain, congestion, and sore throat Eyes: Negative for eye pain, eye discharge, and visual disturbance  Cardiovascular: Negative for chest pain and palpitation Respiratory: Negative cough, shortness of breath, and wheezing.  Gastrointestinal: Negative for nausea and vomiting. Negative for abdominal pain. Positive for constipation but much improved Genitourinary: Negative for  dysuria Endocrine: Negative for polydipsia, polyphagia, and polyuria Musculoskeletal: Negative for uncontrolled pain Neurological: Negative for dizziness and headache Skin: Negative for rash and and itchiness Psychiatric: Negative for depression   Past Medical History  Diagnosis Date  . Hypertension   . Arthritis   . GERD (gastroesophageal reflux disease)   . Numbness in both hands     mostly at night  . Anxiety   . Diabetes mellitus without complication     Type 2 on insulin pump  . Shortness of breath dyspnea     with exertion  . Anemia     has had to have iron infusions-sees dr Jonette Eva  . Iron deficiency anemia   . CKD (chronic kidney disease)      Past Surgical History  Procedure Laterality Date  . Hernia repair      umb hernia as child  . Colonoscopy    . Upper gi endoscopy    . Knee arthroscopy  02/12/2012    Procedure: ARTHROSCOPY KNEE;  Surgeon: Kerin Salen, MD;  Location: [place];  Service: Orthopedics;  Laterality: Right;  Partial Lateral Meniscectomy, Debridement chondromalacia  . Orif ankle fracture Right 02/11/2014    Procedure: OPEN REDUCTION INTERNAL FIXATION (ORIF) RIGHT ANKLE FRACTURE;  Surgeon: Kerin Salen, MD;  Location: [place];  Service: Orthopedics;  Laterality: Right;  . Eye surgery Bilateral     Lazer   . Total knee arthroplasty Right 08/05/2014  . Total knee arthroplasty Right 08/05/2014    Procedure: TOTAL KNEE ARTHROPLASTY;  Surgeon: Frederik Pear, MD;  Location: [place];  Service: Orthopedics;  Laterality: Right;    History  Substance Use Topics  . Smoking status: Former Smoker    Quit date: 02/08/1972  . Smokeless tobacco: Never Used  . Alcohol Use: No      Medication List       This list is accurate as of: 08/20/14  3:17 PM.  Always use your most recent med list.               aspirin EC 325 MG tablet  Take 1 tablet (325 mg total) by mouth 2 (two) times daily.     atorvastatin 40 MG tablet  Commonly known as:  LIPITOR  Take 40 mg by mouth daily at 6 PM.     bisacodyl 5 MG EC tablet  Commonly known as:  DULCOLAX  Take 5 mg by mouth daily as needed for moderate constipation.     CALCIUM PO  Take 1 capsule by mouth at bedtime.     docusate sodium 100 MG capsule  Commonly known as:  COLACE  Take 100 mg by mouth 2 (two) times daily.     escitalopram 10 MG tablet  Commonly known as:  LEXAPRO  Take 10 mg by mouth daily.     glucose blood test strip  Commonly known as:  ONETOUCH VERIO  Use as instructed to check blood sugar once a day dx code E11.65     insulin lispro 100 UNIT/ML injection  Commonly known as:  HUMALOG  Inject 4-6 Units into the skin 3 (three)  times daily before meals. 4 units with breakfast, 4 units with lunch, and 6 units with dinner via self-managed insulin pump     insulin lispro 100 UNIT/ML injection  Commonly known as:  HUMALOG  20 Units by Pump Prime route daily.     lansoprazole 30 MG capsule  Commonly known as:  PREVACID  Take 30 mg by mouth daily.     levocetirizine 5 MG tablet  Commonly known as:  XYZAL  Take 5 mg by mouth daily.     methocarbamol 500 MG tablet  Commonly known as:  ROBAXIN  Take 500 mg by mouth 4 (four) times daily.     ONETOUCH DELICA LANCETS FINE Misc  Use to check blood sugar once a day dx code E11.65     OxyCODONE 10 mg T12a 12 hr tablet  Commonly known as:  OXYCONTIN  TAKE 1 TABLET BY MOUTH TWICE DAILY FOR ANKLE FRACTURE S/P ORIF **DO NOT CRUSH**     oxyCODONE-acetaminophen 10-325 MG per tablet  Commonly known as:  PERCOCET  Take 1 tablet by mouth every 6 (six) hours as needed for pain. DO NOT EXCEED 4 GM OF TYLENOL IN 24 HOURS     polyethylene glycol packet  Commonly known as:  MIRALAX / GLYCOLAX  Take 17 g by mouth daily.     saxagliptin HCl 2.5 MG Tabs tablet  Commonly known as:  ONGLYZA  Take 5 mg by mouth daily.     sennosides-docusate sodium 8.6-50 MG tablet  Commonly known as:  SENOKOT-S  Take 2 tablets by mouth daily.     traMADol 50 MG tablet  Commonly known as:  ULTRAM  Take 50-100 mg by mouth 3 (three) times daily as needed (pain).     V-GO 20 Kit  Use one per day     valsartan-hydrochlorothiazide 80-12.5 MG per tablet  Commonly known as:  DIOVAN-HCT  Take 1 tablet by mouth daily.     VOLTAREN 1 % Gel  Generic drug:  diclofenac sodium  Apply 1 application topically 2 (two) times daily as needed (inflammation).        Physical Exam  BP 140/84 mmHg  Pulse 84  Temp(Src) 98.1 F (36.7 C)  Resp 16  Ht _0  (1.651 m)  Wt 227 lb (102.967 kg)  BMI 37.77 kg/m2  SpO2 97%  Constitutional: WDWN adult female in no acute distress. Conversant and  pleasant HEENT: Normocephalic and atraumatic. PERRL. EOM intact. No icterus. Oral mucosa moist. Posterior pharynx clear of any exudate or lesions. Teeth and gingiva in good general condition.  Neck: Supple and nontender. No lymphadenopathy, masses, or  thyromegaly. No JVD or carotid bruits. Cardiac: Normal S1, S2. RRR without appreciable murmurs, rubs, or gallops. Distal pulses intact.  Trace dependent edema.  Respiratory: Unlabored respiration. Breath sounds clear bilaterally without rales, rhonchi, or wheezes. GI: Audible bowel sounds in all quadrants. Soft, nontender, nondistended.  Musculoskeletal: s/p right knee replacement. Right knee edematous with surgical dressing dry and intact.   Skin: Warm and dry. No rash noted. Neurological: Alert and oriented to person, place, and time. No focal deficits.  Psychiatric: Judgment and insight adequate. Appropriate mood and affect.   Labs Reviewed  CBC Latest Ref Rng 08/17/2014 08/07/2014 08/06/2014  WBC - 4.8 6.5 5.6  Hemoglobin 12.0 - 16.0 g/dL 8.0(A) 8.1(L) 9.3(L)  Hematocrit 36 - 46 % 25(A) 25.5(L) 30.1(L)  Platelets 150 - 399 K/L 334 142(L) 163    CMP Latest Ref Rng 08/17/2014 08/03/2014 02/12/2014  Glucose 70 - 99 mg/dL - 202(H) 262(H)  BUN 4 - 21 mg/dL 14 35(H) 33(H)  Creatinine 0.5 - 1.1 mg/dL 1.4(A) 1.60(H) 1.28(H)  Sodium 137 - 147 mmol/L 141 139 141  Potassium 3.4 - 5.3 mmol/L 4.0 4.1 4.2  Chloride 101 - 111 mmol/L - 109 103  CO2 22 - 32 mmol/L - 25 26  Calcium 8.9 - 10.3 mg/dL - 8.7(L) 8.9  Total Protein 6.0 - 8.3 g/dL - - 6.6  Total Bilirubin 0.3 - 1.2 mg/dL - - 0.3  Alkaline Phos 39 - 117 U/L - - 74  AST 0 - 37 U/L - - 18  ALT 0 - 35 U/L - - 14    Lab Results  Component Value Date   HGBA1C 6.8* 08/03/2014   Assessment & Plan 1. Primary osteoarthritis of right knee S/p Right TKA. Pain is adequately controlled with current regimen. Continue oxycontin 61m every 12 hours with percocet 10/325 1/2 to 1 tab every six hours as  needed for pain and tramadol 577m1-2 tabs every 8 hours as needed for pain. Continue robaxin 50048m times daily for muscle spasms and aspirin 325m42mice daily for DVT prophylaxis. Continue to work with outpatient therapy for gait/strength/balance training to restore/maximize function. Continue to f/u with ortho  2. Essential hypertension, benign Stable. Some elevated readings-mostly likely r/t to pain. Continue valsartan-hctz 80/12.5mg 63mly. PCP to monitor BP and adjust antihypertensives as necessary   3. Depression Mood stable. Continue lexapro 10mg 65my.   4. Constipation, unspecified constipation type Stable. Continue colace, senokot, and miralax. Encourage hydration and ambulation as tolerated  5. Gastroesophageal reflux disease without esophagitis Stable. Continue prevacid 30mg d101m  6. Acute blood loss anemia Stable. Last hgb 8.0. Patient is aware and states that she will schedule a f/u appt with her doctor for iron transfusion (chronic issue). PCP to monitor h&h  7. Type 2 diabetes mellitus with diabetic neuropathy Last a1c 6.8. Continue saxagliptin 5mg dai20mwith self-managed insulin pump. Continue to monitor cbgs  8. Allergic rhinitis, unspecified allergic rhinitis type Stable. Continue xyzal 5mg dail8m9. CKD (chronic kidney disease) stage 3, GFR 30-59 ml/min Last creatine 1.4. Avoid nephrotoxic agents, especially NSAIDs. PCP to monitor renal function  10. HLD. Continue lipitor 40mg dail39m Health services: Outpatient therapy DME required: 4-prong cane PCP follow-up: Please schedule f/u appt with PCP w/in 1-2 weeks of discharge from skilled nursing facility  30-day supply of prescription medications provided (#90 robaxin 500mg, #30 62montin, #30 Percocet 10/325mg, #30 T84mdol 50mg)   [id number] Communication Plan of care discussed with patient and nursing staff. Patient and nursing  staff verbalized understanding and agree with plan of care. No additional  questions or concerns reported.    Arthur Holms, MSN, AGNP-C [facility] [address] [place], [place] [address] [phone] [8am-5pm] After hours: [phone]

## 2014-09-07 ENCOUNTER — Encounter: Payer: Self-pay | Admitting: *Deleted

## 2014-10-09 ENCOUNTER — Other Ambulatory Visit: Payer: Self-pay

## 2014-10-09 DIAGNOSIS — Z1231 Encounter for screening mammogram for malignant neoplasm of breast: Secondary | ICD-10-CM

## 2014-10-27 ENCOUNTER — Ambulatory Visit
Admission: RE | Admit: 2014-10-27 | Discharge: 2014-10-27 | Disposition: A | Discharge status: Home / Self Care | Payer: 59 | Source: Ambulatory Visit

## 2014-10-27 DIAGNOSIS — Z1231 Encounter for screening mammogram for malignant neoplasm of breast: Secondary | ICD-10-CM

## 2015-01-04 ENCOUNTER — Telehealth: Payer: Self-pay | Admitting: Hematology & Oncology

## 2015-01-04 NOTE — Telephone Encounter (Signed)
Patient called and cx 01/05/15 and resch for 01/14/15

## 2015-01-05 ENCOUNTER — Ambulatory Visit: Payer: Self-pay | Admitting: Family

## 2015-01-05 ENCOUNTER — Ambulatory Visit: Payer: Self-pay

## 2015-01-05 ENCOUNTER — Other Ambulatory Visit: Payer: Self-pay

## 2015-01-13 ENCOUNTER — Ambulatory Visit: Payer: Self-pay

## 2015-01-13 ENCOUNTER — Ambulatory Visit: Payer: Self-pay | Admitting: Family

## 2015-01-13 ENCOUNTER — Other Ambulatory Visit: Payer: Self-pay

## 2015-01-14 ENCOUNTER — Ambulatory Visit (HOSPITAL_BASED_OUTPATIENT_CLINIC_OR_DEPARTMENT_OTHER): Payer: 59

## 2015-01-14 ENCOUNTER — Other Ambulatory Visit: Payer: Self-pay | Admitting: Family

## 2015-01-14 ENCOUNTER — Encounter: Payer: Self-pay | Admitting: Family

## 2015-01-14 ENCOUNTER — Other Ambulatory Visit (HOSPITAL_BASED_OUTPATIENT_CLINIC_OR_DEPARTMENT_OTHER): Payer: 59

## 2015-01-14 ENCOUNTER — Ambulatory Visit: Payer: 59

## 2015-01-14 ENCOUNTER — Ambulatory Visit (HOSPITAL_BASED_OUTPATIENT_CLINIC_OR_DEPARTMENT_OTHER): Payer: 59 | Admitting: Family

## 2015-01-14 VITALS — BP 110/82 | HR 93 | Temp 97.4°F | Resp 16 | Ht 65.0 in | Wt 232.0 lb

## 2015-01-14 DIAGNOSIS — D649 Anemia, unspecified: Secondary | ICD-10-CM

## 2015-01-14 DIAGNOSIS — D509 Iron deficiency anemia, unspecified: Secondary | ICD-10-CM

## 2015-01-14 DIAGNOSIS — N189 Chronic kidney disease, unspecified: Secondary | ICD-10-CM | POA: Diagnosis not present

## 2015-01-14 LAB — CBC WITH DIFFERENTIAL (CANCER CENTER ONLY)
BASO#: 0 10*3/uL (ref 0.0–0.2)
BASO%: 0.5 % (ref 0.0–2.0)
EOS ABS: 0 10*3/uL (ref 0.0–0.5)
EOS%: 0.5 % (ref 0.0–7.0)
HCT: 35.9 % (ref 34.8–46.6)
HEMOGLOBIN: 11.2 g/dL — AB (ref 11.6–15.9)
LYMPH#: 1 10*3/uL (ref 0.9–3.3)
LYMPH%: 27.2 % (ref 14.0–48.0)
MCH: 27.3 pg (ref 26.0–34.0)
MCHC: 31.2 g/dL — AB (ref 32.0–36.0)
MCV: 88 fL (ref 81–101)
MONO#: 0.4 10*3/uL (ref 0.1–0.9)
MONO%: 10 % (ref 0.0–13.0)
NEUT#: 2.3 10*3/uL (ref 1.5–6.5)
NEUT%: 61.8 % (ref 39.6–80.0)
PLATELETS: 216 10*3/uL (ref 145–400)
RBC: 4.1 10*6/uL (ref 3.70–5.32)
RDW: 14.9 % (ref 11.1–15.7)
WBC: 3.8 10*3/uL — ABNORMAL LOW (ref 3.9–10.0)

## 2015-01-14 LAB — CHCC SATELLITE - SMEAR

## 2015-01-14 MED ORDER — SODIUM CHLORIDE 0.9 % IV SOLN
510.0000 mg | Freq: Once | INTRAVENOUS | Status: AC
  Administered 2015-01-14: 510 mg via INTRAVENOUS
  Filled 2015-01-14: qty 17

## 2015-01-14 NOTE — Patient Instructions (Signed)

## 2015-01-14 NOTE — Progress Notes (Signed)
Hematology/Oncology Consultation   Name: TANGANIKA BARRADAS      MRN: [id number]    Location: Room/bed info not found  Date: 01/14/2015 Time:1:46 PM   REFERRING PHYSICIAN: Annetta Maw, MD  REASON FOR CONSULT: Iron deficiency anemia   DIAGNOSIS: Iron deficiency anemia   HISTORY OF PRESENT ILLNESS: Ms. Schoonmaker is a very pleasant 59 yo African American female with history of iron deficiency anemia. She was seen by Dr. Marin Olp 6 years ago for this same issues. She was given iron infusions during that time.  She is symptomatic with fatigue and ice cravings. She states that at her recent follow-up with her PCP her iron studies were low. We do not have access to that lab that report at this time. Her Hgb today is 11.2 with an MCV of 88.  She has no family history of anemia or sickle cell disease/trait.  She has no personal cancer history. Her maternal grandmother had cancer but she is unsure as to what type.  She states that she had diarrhea several weeks ago after eating oatmeal and had a little blood in her toilet tissue. She has had no other episodes of bleeding. She is scheduled to have her routine colonoscopy in November.  Her mammogram in July was negative.   She had a right knee replacement and right ankle reconstruction after a fall while on vacation in May. She is still having pain and some swelling in that knee when she is on her feet for an extended period of time. She has not been able to exercise like she used to.  She is eating healthy and staying well hydrated with water. She does not eat red meats. She eats mostly fish and some chicken with vegetables.  She has had no issue with infections. No fever, chills, n/v, cough, rash, dizziness, SOB, chest pain, palpitations, abdominal pain or constipation.  No numbness or tingling in her extremities.   ROS: All other 10 point review of systems is negative.   PAST MEDICAL HISTORY:   Past Medical History  Diagnosis Date  . Hypertension    . Arthritis   . GERD (gastroesophageal reflux disease)   . Numbness in both hands     mostly at night  . Anxiety   . Diabetes mellitus without complication (HCC)     Type 2 on insulin pump  . Shortness of breath dyspnea     with exertion  . Anemia     has had to have iron infusions-sees dr Jonette Eva  . Iron deficiency anemia   . CKD (chronic kidney disease)     ALLERGIES: Allergies  Allergen Reactions  . Lactose Intolerance (Gi)   . Oatmeal Other (See Comments)    "Gas"       MEDICATIONS:  Current Outpatient Prescriptions on File Prior to Visit  Medication Sig Dispense Refill  . aspirin EC 325 MG tablet Take 1 tablet (325 mg total) by mouth 2 (two) times daily. 30 tablet 0  . atorvastatin (LIPITOR) 40 MG tablet Take 40 mg by mouth daily at 6 PM.    . bisacodyl (DULCOLAX) 5 MG EC tablet Take 5 mg by mouth daily as needed for moderate constipation.    Marland Kitchen CALCIUM PO Take 1 capsule by mouth at bedtime.    . docusate sodium (COLACE) 100 MG capsule Take 100 mg by mouth 2 (two) times daily.    Marland Kitchen escitalopram (LEXAPRO) 10 MG tablet Take 10 mg by mouth daily.    Marland Kitchen glucose blood (  ONETOUCH VERIO) test strip Use as instructed to check blood sugar once a day dx code E11.65 50 each 2  . Insulin Disposable Pump (V-GO 20) KIT Use one per day 1 kit 2  . insulin lispro (HUMALOG) 100 UNIT/ML injection Inject 4-6 Units into the skin 3 (three) times daily before meals. 4 units with breakfast, 4 units with lunch, and 6 units with dinner via self-managed insulin pump    . insulin lispro (HUMALOG) 100 UNIT/ML injection 20 Units by Pump Prime route daily.    . lansoprazole (PREVACID) 30 MG capsule Take 30 mg by mouth daily.    Marland Kitchen levocetirizine (XYZAL) 5 MG tablet Take 5 mg by mouth daily.  0  . methocarbamol (ROBAXIN) 500 MG tablet Take 500 mg by mouth 4 (four) times daily.    Glory Rosebush DELICA LANCETS FINE MISC Use to check blood sugar once a day dx code E11.65 100 each 3  . OxyCODONE (OXYCONTIN) 10  mg T12A 12 hr tablet TAKE 1 TABLET BY MOUTH TWICE DAILY FOR ANKLE FRACTURE S/P ORIF **DO NOT CRUSH** 60 tablet 0  . oxyCODONE-acetaminophen (PERCOCET) 10-325 MG per tablet Take 1 tablet by mouth every 6 (six) hours as needed for pain. DO NOT EXCEED 4 GM OF TYLENOL IN 24 HOURS 120 tablet 0  . polyethylene glycol (MIRALAX / GLYCOLAX) packet Take 17 g by mouth daily.    . saxagliptin HCl (ONGLYZA) 2.5 MG TABS tablet Take 5 mg by mouth daily.    . sennosides-docusate sodium (SENOKOT-S) 8.6-50 MG tablet Take 2 tablets by mouth daily.     . traMADol (ULTRAM) 50 MG tablet Take 50-100 mg by mouth 3 (three) times daily as needed (pain).   1  . valsartan-hydrochlorothiazide (DIOVAN-HCT) 80-12.5 MG per tablet Take 1 tablet by mouth daily.    . VOLTAREN 1 % GEL Apply 1 application topically 2 (two) times daily as needed (inflammation).   0   No current facility-administered medications on file prior to visit.     PAST SURGICAL HISTORY Past Surgical History  Procedure Laterality Date  . Hernia repair      umb hernia as child  . Colonoscopy    . Upper gi endoscopy    . Knee arthroscopy  02/12/2012    Procedure: ARTHROSCOPY KNEE;  Surgeon: Kerin Salen, MD;  Location: [place];  Service: Orthopedics;  Laterality: Right;  Partial Lateral Meniscectomy, Debridement chondromalacia  . Orif ankle fracture Right 02/11/2014    Procedure: OPEN REDUCTION INTERNAL FIXATION (ORIF) RIGHT ANKLE FRACTURE;  Surgeon: Kerin Salen, MD;  Location: [place];  Service: Orthopedics;  Laterality: Right;  . Eye surgery Bilateral     Lazer   . Total knee arthroplasty Right 08/05/2014  . Total knee arthroplasty Right 08/05/2014    Procedure: TOTAL KNEE ARTHROPLASTY;  Surgeon: Frederik Pear, MD;  Location: [place];  Service: Orthopedics;  Laterality: Right;    FAMILY HISTORY: No family history on file.  SOCIAL HISTORY:  reports that she quit smoking about 42 years ago. She has never used smokeless tobacco. She  reports that she does not drink alcohol or use illicit drugs.  PERFORMANCE STATUS: The patient's performance status is 1 - Symptomatic but completely ambulatory  PHYSICAL EXAM: Most Recent Vital Signs: Blood pressure 110/82, pulse 93, temperature 97.4 F (36.3 C), temperature source Oral, resp. rate 16, height $RemoveBe'5\' 5"'vvutSJZOr$  (1.651 m), weight 232 lb (105.235 kg). BP 110/82 mmHg  Pulse 93  Temp(Src) 97.4 F (36.3 C) (  Oral)  Resp 16  Ht 5' 5"  (1.651 m)  Wt 232 lb (105.235 kg)  BMI 38.61 kg/m2  General Appearance:    Alert, cooperative, no distress, appears stated age  Head:    Normocephalic, without obvious abnormality, atraumatic  Eyes:    PERRL, conjunctiva/corneas clear, EOM's intact, fundi    benign, both eyes        Throat:   Lips, mucosa, and tongue normal; teeth and gums normal  Neck:   Supple, symmetrical, trachea midline, no adenopathy;    thyroid:  no enlargement/tenderness/nodules; no carotid   bruit or JVD  Back:     Symmetric, no curvature, ROM normal, no CVA tenderness  Lungs:     Clear to auscultation bilaterally, respirations unlabored  Chest Wall:    No tenderness or deformity   Heart:    Regular rate and rhythm, S1 and S2 normal, no murmur, rub   or gallop     Abdomen:     Soft, non-tender, bowel sounds active all four quadrants,    no masses, no organomegaly        Extremities:   Extremities normal, atraumatic, no cyanosis or edema  Pulses:   2+ and symmetric all extremities  Skin:   Skin color, texture, turgor normal, no rashes or lesions  Lymph nodes:   Cervical, supraclavicular, and axillary nodes normal  Neurologic:   CNII-XII intact, normal strength, sensation and reflexes    throughout    LABORATORY DATA:  Results for orders placed or performed in visit on 01/14/15 (from the past 48 hour(s))  CBC with Differential [facility])     Status: Abnormal   Collection Time: 01/14/15  1:20 PM  Result Value Ref Range   WBC 3.8 (L) 3.9 - 10.0 10e3/uL   RBC  4.10 3.70 - 5.32 10e6/uL   HGB 11.2 (L) 11.6 - 15.9 g/dL   HCT 35.9 34.8 - 46.6 %   MCV 88 81 - 101 fL   MCH 27.3 26.0 - 34.0 pg   MCHC 31.2 (L) 32.0 - 36.0 g/dL   RDW 14.9 11.1 - 15.7 %   Platelets 216 145 - 400 10e3/uL   NEUT# 2.3 1.5 - 6.5 10e3/uL   LYMPH# 1.0 0.9 - 3.3 10e3/uL   MONO# 0.4 0.1 - 0.9 10e3/uL   Eosinophils Absolute 0.0 0.0 - 0.5 10e3/uL   BASO# 0.0 0.0 - 0.2 10e3/uL   NEUT% 61.8 39.6 - 80.0 %   LYMPH% 27.2 14.0 - 48.0 %   MONO% 10.0 0.0 - 13.0 %   EOS% 0.5 0.0 - 7.0 %   BASO% 0.5 0.0 - 2.0 %  [facility] - Smear     Status: None   Collection Time: 01/14/15  1:20 PM  Result Value Ref Range   Smear Result Smear Available       RADIOGRAPHY: No results found.     PATHOLOGY: None  ASSESSMENT/PLAN: Ms. Thursby is a very pleasant 59 yo [place] American female with history of iron deficiency anemia. Her last iron infusion was 7 years ago.  She had recent iron studies drawn with her PCP which came back low. We did redraw these today and the results are pending.  She is symptomatic with fatigue and ice cravings.  We will go ahead and give her a dose of Feraheme today and possibly a second dose in 8 days depending on her lab work.  We will plan to see her back in 6 weeks for labs and follow-up.  All questions were answered. She knows to contact us with any problems, questions or concerns. We can certainly see her much sooner if necessary.  The patient was discussed with and also seen by Dr. Marin Olp and he is in agreement with the aforementioned.   [facility]    Addendum:  I saw and examined the patient with Sarah. We looked at her blood smear. The blister was consistent with iron deficiency. She had microcytic red blood cells. She had hypochromic red blood cells. There is no nucleated red blood cells. White cells and platelets looked okay.  Her ferritin was only 14. Iron saturation was 25%.  We will go ahead and give her iron. I this will help her out.  I think will help her quality of life. We will then give her a second dose in 8 days.  We'll plan to get her back to see [place] in another 6 weeks.  She will have a colonoscopy in November. This will clearly be very helpful.  Lum Keas

## 2015-01-15 LAB — IRON AND TIBC CHCC
%SAT: 25 % (ref 21–57)
Iron: 88 ug/dL (ref 41–142)
TIBC: 354 ug/dL (ref 236–444)
UIBC: 266 ug/dL (ref 120–384)

## 2015-01-15 LAB — FERRITIN CHCC: Ferritin: 14 ng/ml (ref 9–269)

## 2015-01-21 ENCOUNTER — Other Ambulatory Visit: Payer: Self-pay | Admitting: Family

## 2015-01-21 DIAGNOSIS — D560 Alpha thalassemia: Secondary | ICD-10-CM

## 2015-01-21 LAB — HEMOGLOBINOPATHY EVALUATION
HGB A2 QUANT: 2.7 % (ref 2.2–3.2)
HGB A: 97.3 % (ref 96.8–97.8)
HGB F QUANT: 0 % (ref 0.0–2.0)
HGB S QUANTITAION: 0 %
Hemoglobin Other: 0 %

## 2015-01-21 LAB — ALPHA-THALASSEMIA GENOTYPR

## 2015-01-21 LAB — RETICULOCYTES (CHCC)
ABS Retic: 63.5 10*3/uL (ref 19.0–186.0)
RBC.: 4.23 MIL/uL (ref 3.87–5.11)
Retic Ct Pct: 1.5 % (ref 0.4–2.3)

## 2015-01-21 LAB — ERYTHROPOIETIN: ERYTHROPOIETIN: 16.2 m[IU]/mL (ref 2.6–18.5)

## 2015-01-21 MED ORDER — FOLIC ACID 1 MG PO TABS: 1.0000 mg | ORAL_TABLET | Freq: Every day | ORAL | Status: DC

## 2015-02-19 ENCOUNTER — Other Ambulatory Visit (HOSPITAL_BASED_OUTPATIENT_CLINIC_OR_DEPARTMENT_OTHER): Payer: 59

## 2015-02-19 ENCOUNTER — Encounter: Payer: Self-pay | Admitting: Family

## 2015-02-19 ENCOUNTER — Ambulatory Visit (HOSPITAL_BASED_OUTPATIENT_CLINIC_OR_DEPARTMENT_OTHER): Payer: 59 | Admitting: Family

## 2015-02-19 VITALS — BP 151/79 | HR 100 | Temp 97.7°F | Resp 16 | Ht 65.0 in | Wt 234.0 lb

## 2015-02-19 DIAGNOSIS — D509 Iron deficiency anemia, unspecified: Secondary | ICD-10-CM

## 2015-02-19 LAB — CBC WITH DIFFERENTIAL (CANCER CENTER ONLY)
BASO#: 0 10*3/uL (ref 0.0–0.2)
BASO%: 0.8 % (ref 0.0–2.0)
EOS ABS: 0 10*3/uL (ref 0.0–0.5)
EOS%: 1.1 % (ref 0.0–7.0)
HCT: 37.4 % (ref 34.8–46.6)
HEMOGLOBIN: 11.9 g/dL (ref 11.6–15.9)
LYMPH#: 0.9 10*3/uL (ref 0.9–3.3)
LYMPH%: 33 % (ref 14.0–48.0)
MCH: 28.2 pg (ref 26.0–34.0)
MCHC: 31.8 g/dL — AB (ref 32.0–36.0)
MCV: 89 fL (ref 81–101)
MONO#: 0.4 10*3/uL (ref 0.1–0.9)
MONO%: 13.4 % — AB (ref 0.0–13.0)
NEUT%: 51.7 % (ref 39.6–80.0)
NEUTROS ABS: 1.4 10*3/uL — AB (ref 1.5–6.5)
Platelets: 155 10*3/uL (ref 145–400)
RBC: 4.22 10*6/uL (ref 3.70–5.32)
RDW: 13.9 % (ref 11.1–15.7)
WBC: 2.6 10*3/uL — ABNORMAL LOW (ref 3.9–10.0)

## 2015-02-19 LAB — IRON AND TIBC CHCC
%SAT: 48 % (ref 21–57)
IRON: 132 ug/dL (ref 41–142)
TIBC: 278 ug/dL (ref 236–444)
UIBC: 145 ug/dL (ref 120–384)

## 2015-02-19 LAB — FERRITIN CHCC: Ferritin: 100 ng/ml (ref 9–269)

## 2015-02-19 NOTE — Progress Notes (Signed)
Hematology and Oncology Follow Up Visit  Karen Gardner [id number] 30-Dec-1955 59 y.o. 02/19/2015   Principle Diagnosis:  Iron deficiency anemia  Current Therapy:   IV iron as indicated     Interim History:  Karen Gardner is here today for a follow-up. She is feeling much better since receiving iron in October. She denies fatigue and is no longer chewing ice. She denies fever, chills, n/v, cough, rash, headaches, dizziness, SOB, chest pain, palpitations, abdominal pain or changes in bowel or bladder habits. No episodes of bleeding or bruising.  She states that her knee pain is improving. The swelling in her left knee is down. She has started a walking regimen in the evenings that she feels is helping.  She has some mild numbness and tingling in her hands at times that is tolerable.  She has had healthy appetite and is staying well hydrated. Her weight is stable.    Medications:    Medication List       This list is accurate as of: 02/19/15 11:35 AM.  Always use your most recent med list.               aspirin EC 325 MG tablet  Take 1 tablet (325 mg total) by mouth 2 (two) times daily.     atorvastatin 40 MG tablet  Commonly known as:  LIPITOR  Take 40 mg by mouth daily at 6 PM.     bisacodyl 5 MG EC tablet  Commonly known as:  DULCOLAX  Take 5 mg by mouth daily as needed for moderate constipation.     CALCIUM PO  Take 1 capsule by mouth at bedtime.     docusate sodium 100 MG capsule  Commonly known as:  COLACE  Take 100 mg by mouth 2 (two) times daily.     escitalopram 10 MG tablet  Commonly known as:  LEXAPRO  Take 10 mg by mouth daily.     folic acid 1 MG tablet  Commonly known as:  FOLVITE  Take 1 tablet (1 mg total) by mouth daily.     glucose blood test strip  Commonly known as:  ONETOUCH VERIO  Use as instructed to check blood sugar once a day dx code E11.65     insulin lispro 100 UNIT/ML injection  Commonly known as:  HUMALOG  Inject 4-6 Units  into the skin 3 (three) times daily before meals. 4 units with breakfast, 4 units with lunch, and 6 units with dinner via self-managed insulin pump     insulin lispro 100 UNIT/ML injection  Commonly known as:  HUMALOG  20 Units by Pump Prime route daily.     lansoprazole 30 MG capsule  Commonly known as:  PREVACID  Take 30 mg by mouth daily.     levocetirizine 5 MG tablet  Commonly known as:  XYZAL  Take 5 mg by mouth daily.     methocarbamol 500 MG tablet  Commonly known as:  ROBAXIN  Take 500 mg by mouth 4 (four) times daily.     ONETOUCH DELICA LANCETS FINE Misc  Use to check blood sugar once a day dx code E11.65     oxyCODONE 10 mg 12 hr tablet  Commonly known as:  OXYCONTIN  TAKE 1 TABLET BY MOUTH TWICE DAILY FOR ANKLE FRACTURE S/P ORIF **DO NOT CRUSH**     oxyCODONE-acetaminophen 10-325 MG tablet  Commonly known as:  PERCOCET  Take 1 tablet by mouth every 6 (six) hours as needed for  pain. DO NOT EXCEED 4 GM OF TYLENOL IN 24 HOURS     polyethylene glycol packet  Commonly known as:  MIRALAX / GLYCOLAX  Take 17 g by mouth daily.     saxagliptin HCl 2.5 MG Tabs tablet  Commonly known as:  ONGLYZA  Take 5 mg by mouth daily.     sennosides-docusate sodium 8.6-50 MG tablet  Commonly known as:  SENOKOT-S  Take 2 tablets by mouth daily.     traMADol 50 MG tablet  Commonly known as:  ULTRAM  Take 50-100 mg by mouth 3 (three) times daily as needed (pain).     V-GO 20 Kit  Use one per day     valsartan-hydrochlorothiazide 80-12.5 MG tablet  Commonly known as:  DIOVAN-HCT  Take 1 tablet by mouth daily.     VOLTAREN 1 % Gel  Generic drug:  diclofenac sodium  Apply 1 application topically 2 (two) times daily as needed (inflammation).     zolpidem 5 MG tablet  Commonly known as:  AMBIEN  TK 1 T PO QD HS        Allergies:  Allergies  Allergen Reactions  . Lactose Intolerance (Gi)   . Oatmeal Other (See Comments)    "Gas"     Past Medical History, Surgical  history, Social history, and Family History were reviewed and updated.  Review of Systems: All other 10 point review of systems is negative.   Physical Exam:  vitals were not taken for this visit.  Wt Readings from Last 3 Encounters:  01/14/15 232 lb (105.235 kg)  08/20/14 227 lb (102.967 kg)  08/10/14 243 lb 12.8 oz (110.587 kg)    Ocular: Sclerae unicteric, pupils equal, round and reactive to light Ear-nose-throat: Oropharynx clear, dentition fair Lymphatic: No cervical or supraclavicular adenopathy Lungs no rales or rhonchi, good excursion bilaterally Heart regular rate and rhythm, no murmur appreciated Abd soft, nontender, positive bowel sounds MSK no focal spinal tenderness, no joint edema Neuro: non-focal, well-oriented, appropriate affect Breasts: Deferred  Lab Results  Component Value Date   WBC 2.6* 02/19/2015   HGB 11.9 02/19/2015   HCT 37.4 02/19/2015   MCV 89 02/19/2015   PLT 155 02/19/2015   Lab Results  Component Value Date   FERRITIN 14 01/14/2015   IRON 88 01/14/2015   TIBC 354 01/14/2015   UIBC 266 01/14/2015   IRONPCTSAT 25 01/14/2015   Lab Results  Component Value Date   RETICCTPCT 1.5 01/14/2015   RBC 4.22 02/19/2015   RETICCTABS 63.5 01/14/2015   No results found for: KPAFRELGTCHN, LAMBDASER, KAPLAMBRATIO No results found for: IGGSERUM, IGA, IGMSERUM No results found for: Odetta Pink, SPEI   Chemistry      Component Value Date/Time   NA 141 08/17/2014   NA 139 08/03/2014 1524   K 4.0 08/17/2014   CL 109 08/03/2014 1524   CO2 25 08/03/2014 1524   BUN 14 08/17/2014   BUN 35* 08/03/2014 1524   CREATININE 1.4* 08/17/2014   CREATININE 1.60* 08/03/2014 1524      Component Value Date/Time   CALCIUM 8.7* 08/03/2014 1524   ALKPHOS 74 02/12/2014 0510   AST 18 02/12/2014 0510   ALT 14 02/12/2014 0510   BILITOT 0.3 02/12/2014 0510     Impression and Plan: Karen Gardner is a very  pleasant 59 yo African American female with history of iron deficiency anemia. She received Feraheme in October and has responded nicely. She is asymptomatic at  this time.  Her Hgb has come up nicely to 11.9 with an MCV of 89.  Her iron studies look good today. She will not need an infusion at this time.  We will plan to see her back in 3 months for labs and follow-up.  She will contact us with any questions or concerns. We can certainly see her much sooner if necessary.  Eliezer Bottom, NP 11/18/201611:35 AM

## 2015-02-26 ENCOUNTER — Ambulatory Visit: Payer: Self-pay | Admitting: Family

## 2015-02-26 ENCOUNTER — Other Ambulatory Visit: Payer: Self-pay

## 2015-05-26 ENCOUNTER — Ambulatory Visit (HOSPITAL_BASED_OUTPATIENT_CLINIC_OR_DEPARTMENT_OTHER): Payer: BLUE CROSS/BLUE SHIELD | Admitting: Hematology & Oncology

## 2015-05-26 ENCOUNTER — Other Ambulatory Visit (HOSPITAL_BASED_OUTPATIENT_CLINIC_OR_DEPARTMENT_OTHER): Payer: BLUE CROSS/BLUE SHIELD

## 2015-05-26 ENCOUNTER — Encounter: Payer: Self-pay | Admitting: Hematology & Oncology

## 2015-05-26 VITALS — BP 125/79 | HR 101 | Temp 98.1°F | Resp 18 | Ht 65.0 in | Wt 222.0 lb

## 2015-05-26 DIAGNOSIS — D509 Iron deficiency anemia, unspecified: Secondary | ICD-10-CM

## 2015-05-26 DIAGNOSIS — K909 Intestinal malabsorption, unspecified: Secondary | ICD-10-CM

## 2015-05-26 HISTORY — DX: Iron deficiency anemia, unspecified: D50.9

## 2015-05-26 HISTORY — DX: Intestinal malabsorption, unspecified: K90.9

## 2015-05-26 LAB — CBC WITH DIFFERENTIAL (CANCER CENTER ONLY)
BASO#: 0 10*3/uL (ref 0.0–0.2)
BASO%: 0.6 % (ref 0.0–2.0)
EOS%: 1.8 % (ref 0.0–7.0)
Eosinophils Absolute: 0.1 10*3/uL (ref 0.0–0.5)
HEMATOCRIT: 37.3 % (ref 34.8–46.6)
HGB: 12.1 g/dL (ref 11.6–15.9)
LYMPH#: 1.1 10*3/uL (ref 0.9–3.3)
LYMPH%: 33.1 % (ref 14.0–48.0)
MCH: 28.4 pg (ref 26.0–34.0)
MCHC: 32.4 g/dL (ref 32.0–36.0)
MCV: 88 fL (ref 81–101)
MONO#: 0.4 10*3/uL (ref 0.1–0.9)
MONO%: 13.4 % — AB (ref 0.0–13.0)
NEUT%: 51.1 % (ref 39.6–80.0)
NEUTROS ABS: 1.7 10*3/uL (ref 1.5–6.5)
Platelets: 185 10*3/uL (ref 145–400)
RBC: 4.26 10*6/uL (ref 3.70–5.32)
RDW: 13 % (ref 11.1–15.7)
WBC: 3.3 10*3/uL — ABNORMAL LOW (ref 3.9–10.0)

## 2015-05-26 LAB — FERRITIN: Ferritin: 86 ng/ml (ref 9–269)

## 2015-05-26 LAB — IRON AND TIBC
%SAT: 43 % (ref 21–57)
IRON: 121 ug/dL (ref 41–142)
TIBC: 281 ug/dL (ref 236–444)
UIBC: 160 ug/dL (ref 120–384)

## 2015-05-26 NOTE — Progress Notes (Signed)
Hematology and Oncology Follow Up Visit  Karen Gardner [id number] 1955-07-17 60 y.o. 05/26/2015   Principle Diagnosis:  Iron deficiency anemia Iron malabsorption  Current Therapy:   IV iron as indicated     Interim History:  Karen Gardner is here today for a follow-up.  She is doing okay. We last saw her back in November. She had iron back in October. We saw her in November, her ferritin was 100 with iron saturation of 48%.  She is having some problems with her left hand. Mostly her left thumb. I gave her the name of a hand orthopedic specialist in [place]. She also is having some continued problems with her right knee and right lower leg. Hopefully, the with these can help with this.  She's had no obvious bleeding.  His been no mouth sores. She is not chewing ice. She's had no rashes. There's been no weight loss wakening. She try to watch her blood sugars.  Overall, her performance status is ECOG 1.     Medications:    Medication List       This list is accurate as of: 05/26/15 11:13 AM.  Always use your most recent med list.               aspirin EC 325 MG tablet  Take 1 tablet (325 mg total) by mouth 2 (two) times daily.     atorvastatin 40 MG tablet  Commonly known as:  LIPITOR  Take 40 mg by mouth daily at 6 PM.     CALCIUM PO  Take 1 capsule by mouth at bedtime.     escitalopram 10 MG tablet  Commonly known as:  LEXAPRO  Take 10 mg by mouth daily.     glucose blood test strip  Commonly known as:  ONETOUCH VERIO  Use as instructed to check blood sugar once a day dx code E11.65     insulin lispro 100 UNIT/ML injection  Commonly known as:  HUMALOG  Inject 4-6 Units into the skin 3 (three) times daily before meals. 4 units with breakfast, 4 units with lunch, and 6 units with dinner via self-managed insulin pump     insulin lispro 100 UNIT/ML injection  Commonly known as:  HUMALOG  20 Units by Pump Prime route daily.     lansoprazole 30 MG capsule    Commonly known as:  PREVACID  Take 30 mg by mouth daily.     methocarbamol 500 MG tablet  Commonly known as:  ROBAXIN  Take 500 mg by mouth 4 (four) times daily.     ONETOUCH DELICA LANCETS FINE Misc  Use to check blood sugar once a day dx code E11.65     saxagliptin HCl 2.5 MG Tabs tablet  Commonly known as:  ONGLYZA  Take 5 mg by mouth daily.     sennosides-docusate sodium 8.6-50 MG tablet  Commonly known as:  SENOKOT-S  Take 2 tablets by mouth daily.     V-GO 20 Kit  Use one per day     valsartan-hydrochlorothiazide 80-12.5 MG tablet  Commonly known as:  DIOVAN-HCT  Take 1 tablet by mouth daily.     VOLTAREN 1 % Gel  Generic drug:  diclofenac sodium  Apply 1 application topically 2 (two) times daily as needed (inflammation).        Allergies:  Allergies  Allergen Reactions  . Lactose Intolerance (Gi)   . Oatmeal Other (See Comments)    "Gas"     Past Medical  History, Surgical history, Social history, and Family History were reviewed and updated.  Review of Systems: All other 10 point review of systems is negative.   Physical Exam:  height is 5' 5"  (1.651 m) and weight is 222 lb (100.699 kg). Her oral temperature is 98.1 F (36.7 C). Her blood pressure is 125/79 and her pulse is 101. Her respiration is 18.   Wt Readings from Last 3 Encounters:  05/26/15 222 lb (100.699 kg)  02/19/15 234 lb (106.142 kg)  01/14/15 232 lb (105.235 kg)    Ocular: Sclerae unicteric, pupils equal, round and reactive to light Ear-nose-throat: Oropharynx clear, dentition fair Lymphatic: No cervical or supraclavicular adenopathy Lungs no rales or rhonchi, good excursion bilaterally Heart regular rate and rhythm, no murmur appreciated Abd soft, nontender, positive bowel sounds MSK no focal spinal tenderness, no joint edema Neuro: non-focal, well-oriented, appropriate affect Breasts: Deferred  Lab Results  Component Value Date   WBC 3.3* 05/26/2015   HGB 12.1 05/26/2015    HCT 37.3 05/26/2015   MCV 88 05/26/2015   PLT 185 05/26/2015   Lab Results  Component Value Date   FERRITIN 100 02/19/2015   IRON 132 02/19/2015   TIBC 278 02/19/2015   UIBC 145 02/19/2015   IRONPCTSAT 48 02/19/2015   Lab Results  Component Value Date   RETICCTPCT 1.5 01/14/2015   RBC 4.26 05/26/2015   RETICCTABS 63.5 01/14/2015   No results found for: KPAFRELGTCHN, LAMBDASER, KAPLAMBRATIO No results found for: IGGSERUM, IGA, IGMSERUM No results found for: Odetta Pink, SPEI   Chemistry      Component Value Date/Time   NA 141 08/17/2014   NA 139 08/03/2014 1524   K 4.0 08/17/2014   CL 109 08/03/2014 1524   CO2 25 08/03/2014 1524   BUN 14 08/17/2014   BUN 35* 08/03/2014 1524   CREATININE 1.4* 08/17/2014   CREATININE 1.60* 08/03/2014 1524      Component Value Date/Time   CALCIUM 8.7* 08/03/2014 1524   ALKPHOS 74 02/12/2014 0510   AST 18 02/12/2014 0510   ALT 14 02/12/2014 0510   BILITOT 0.3 02/12/2014 0510     Impression and Plan: Karen Gardner is a very pleasant 60 yo African American female with history of iron deficiency anemia. She received Feraheme in October and has responded nicely. She is asymptomatic at this time.  Her Hgb has come up nicely to 12.1 with an MCV of 89.   For now, I think we can watch her. I'm sure that her iron studies should be okay.  We'll plan to see her back now in 4 months.  Volanda Napoleon, MD 2/22/201711:13 AM

## 2015-07-16 ENCOUNTER — Ambulatory Visit: Payer: Self-pay | Admitting: Family

## 2015-07-16 ENCOUNTER — Other Ambulatory Visit: Payer: Self-pay

## 2015-07-29 ENCOUNTER — Other Ambulatory Visit: Payer: Self-pay

## 2015-07-29 DIAGNOSIS — Z1231 Encounter for screening mammogram for malignant neoplasm of breast: Secondary | ICD-10-CM

## 2015-09-07 ENCOUNTER — Emergency Department (HOSPITAL_COMMUNITY): Payer: BLUE CROSS/BLUE SHIELD

## 2015-09-07 ENCOUNTER — Emergency Department (HOSPITAL_COMMUNITY)
Admission: EM | Admit: 2015-09-07 | Discharge: 2015-09-07 | Disposition: A | Discharge status: Home / Self Care | Payer: BLUE CROSS/BLUE SHIELD | Attending: Emergency Medicine | Admitting: Emergency Medicine

## 2015-09-07 ENCOUNTER — Encounter (HOSPITAL_COMMUNITY): Payer: Self-pay

## 2015-09-07 DIAGNOSIS — I129 Hypertensive chronic kidney disease with stage 1 through stage 4 chronic kidney disease, or unspecified chronic kidney disease: Secondary | ICD-10-CM | POA: Diagnosis not present

## 2015-09-07 DIAGNOSIS — Z794 Long term (current) use of insulin: Secondary | ICD-10-CM | POA: Diagnosis not present

## 2015-09-07 DIAGNOSIS — Z79899 Other long term (current) drug therapy: Secondary | ICD-10-CM | POA: Insufficient documentation

## 2015-09-07 DIAGNOSIS — Z87891 Personal history of nicotine dependence: Secondary | ICD-10-CM | POA: Diagnosis not present

## 2015-09-07 DIAGNOSIS — Z96651 Presence of right artificial knee joint: Secondary | ICD-10-CM | POA: Insufficient documentation

## 2015-09-07 DIAGNOSIS — E119 Type 2 diabetes mellitus without complications: Secondary | ICD-10-CM | POA: Diagnosis not present

## 2015-09-07 DIAGNOSIS — Z7982 Long term (current) use of aspirin: Secondary | ICD-10-CM | POA: Insufficient documentation

## 2015-09-07 DIAGNOSIS — N189 Chronic kidney disease, unspecified: Secondary | ICD-10-CM | POA: Diagnosis not present

## 2015-09-07 DIAGNOSIS — R55 Syncope and collapse: Secondary | ICD-10-CM | POA: Diagnosis present

## 2015-09-07 DIAGNOSIS — R112 Nausea with vomiting, unspecified: Secondary | ICD-10-CM | POA: Diagnosis not present

## 2015-09-07 DIAGNOSIS — M1990 Unspecified osteoarthritis, unspecified site: Secondary | ICD-10-CM | POA: Insufficient documentation

## 2015-09-07 LAB — CBC WITH DIFFERENTIAL/PLATELET
BASOS ABS: 0 10*3/uL (ref 0.0–0.1)
Basophils Relative: 0 %
Eosinophils Absolute: 0 10*3/uL (ref 0.0–0.7)
Eosinophils Relative: 1 %
HEMATOCRIT: 40.6 % (ref 36.0–46.0)
Hemoglobin: 13.1 g/dL (ref 12.0–15.0)
LYMPHS PCT: 14 %
Lymphs Abs: 1 10*3/uL (ref 0.7–4.0)
MCH: 28.3 pg (ref 26.0–34.0)
MCHC: 32.3 g/dL (ref 30.0–36.0)
MCV: 87.7 fL (ref 78.0–100.0)
Monocytes Absolute: 0.4 10*3/uL (ref 0.1–1.0)
Monocytes Relative: 6 %
NEUTROS ABS: 5.2 10*3/uL (ref 1.7–7.7)
Neutrophils Relative %: 79 %
PLATELETS: 180 10*3/uL (ref 150–400)
RBC: 4.63 MIL/uL (ref 3.87–5.11)
RDW: 12.8 % (ref 11.5–15.5)
WBC: 6.6 10*3/uL (ref 4.0–10.5)

## 2015-09-07 LAB — URINALYSIS, ROUTINE W REFLEX MICROSCOPIC
Bilirubin Urine: NEGATIVE
GLUCOSE, UA: NEGATIVE mg/dL
Hgb urine dipstick: NEGATIVE
KETONES UR: 15 mg/dL — AB
LEUKOCYTES UA: NEGATIVE
Nitrite: NEGATIVE
PH: 6 (ref 5.0–8.0)
Protein, ur: 30 mg/dL — AB
SPECIFIC GRAVITY, URINE: 1.024 (ref 1.005–1.030)

## 2015-09-07 LAB — COMPREHENSIVE METABOLIC PANEL
ALBUMIN: 4.1 g/dL (ref 3.5–5.0)
ALT: 23 U/L (ref 14–54)
AST: 28 U/L (ref 15–41)
Alkaline Phosphatase: 92 U/L (ref 38–126)
Anion gap: 8 (ref 5–15)
BILIRUBIN TOTAL: 0.6 mg/dL (ref 0.3–1.2)
BUN: 20 mg/dL (ref 6–20)
CHLORIDE: 105 mmol/L (ref 101–111)
CO2: 25 mmol/L (ref 22–32)
CREATININE: 1.26 mg/dL — AB (ref 0.44–1.00)
Calcium: 9.7 mg/dL (ref 8.9–10.3)
GFR calc Af Amer: 52 mL/min — ABNORMAL LOW (ref >60)
GFR, EST NON AFRICAN AMERICAN: 45 mL/min — AB (ref >60)
GLUCOSE: 180 mg/dL — AB (ref 65–99)
Potassium: 3.6 mmol/L (ref 3.5–5.1)
Sodium: 138 mmol/L (ref 135–145)
Total Protein: 7.9 g/dL (ref 6.5–8.1)

## 2015-09-07 LAB — URINE MICROSCOPIC-ADD ON
RBC / HPF: NONE SEEN RBC/hpf (ref 0–5)
Squamous Epithelial / LPF: NONE SEEN
WBC, UA: NONE SEEN WBC/hpf (ref 0–5)

## 2015-09-07 LAB — I-STAT TROPONIN, ED: TROPONIN I, POC: 0 ng/mL (ref 0.00–0.08)

## 2015-09-07 LAB — CBG MONITORING, ED: GLUCOSE-CAPILLARY: 190 mg/dL — AB (ref 65–99)

## 2015-09-07 MED ORDER — SODIUM CHLORIDE 0.9 % IV BOLUS (SEPSIS)
500.0000 mL | Freq: Once | INTRAVENOUS | Status: AC
  Administered 2015-09-07: 500 mL via INTRAVENOUS

## 2015-09-07 NOTE — ED Notes (Signed)
Per EMS-The patient reported that she went waking this AM and when she returned home she had N/V and a witnessed syncopal episode. ptient has a hematoma to the head. EKG-ST. Patient c/o dizziness when standing or lying flat. No head or neck pain.

## 2015-09-07 NOTE — ED Notes (Signed)
Bed: WA08 Expected date:  Expected time:  Means of arrival:  Comments: 31F/syncope/n/v/cp

## 2015-09-07 NOTE — Discharge Instructions (Signed)
You were seen and evaluated today for your episode of passing out spells weakness and vomiting. This seems likely related to dehydration. Please drink plenty of water. Monitor to make sure that your urine is not dark in color that it is almost clear. Please follow-up with her primary care physician for further evaluation.  Also make sure you drink water before you go on any walks. It would be advised to drink a bottle of water while you walk as well. Return here with headache, chest pain, shortness of breath.   Syncope Syncope is a medical term for fainting or passing out. This means you lose consciousness and drop to the ground. People are generally unconscious for less than 5 minutes. You may have some muscle twitches for up to 15 seconds before waking up and returning to normal. Syncope occurs more often in older adults, but it can happen to anyone. While most causes of syncope are not dangerous, syncope can be a sign of a serious medical problem. It is important to seek medical care.  CAUSES  Syncope is caused by a sudden drop in blood flow to the brain. The specific cause is often not determined. Factors that can bring on syncope include:  Taking medicines that lower blood pressure.  Sudden changes in posture, such as standing up quickly.  Taking more medicine than prescribed.  Standing in one place for too long.  Seizure disorders.  Dehydration and excessive exposure to heat.  Low blood sugar (hypoglycemia).  Straining to have a bowel movement.  Heart disease, irregular heartbeat, or other circulatory problems.  Fear, emotional distress, seeing blood, or severe pain. SYMPTOMS  Right before fainting, you may:  Feel dizzy or light-headed.  Feel nauseous.  See all white or all black in your field of vision.  Have cold, clammy skin. DIAGNOSIS  Your health care provider will ask about your symptoms, perform a physical exam, and perform an electrocardiogram (ECG) to record the  electrical activity of your heart. Your health care provider may also perform other heart or blood tests to determine the cause of your syncope which may include:  Transthoracic echocardiogram (TTE). During echocardiography, sound waves are used to evaluate how blood flows through your heart.  Transesophageal echocardiogram (TEE).  Cardiac monitoring. This allows your health care provider to monitor your heart rate and rhythm in real time.  Holter monitor. This is a portable device that records your heartbeat and can help diagnose heart arrhythmias. It allows your health care provider to track your heart activity for several days, if needed.  Stress tests by exercise or by giving medicine that makes the heart beat faster. TREATMENT  In most cases, no treatment is needed. Depending on the cause of your syncope, your health care provider may recommend changing or stopping some of your medicines. HOME CARE INSTRUCTIONS  Have someone stay with you until you feel stable.  Do not drive, use machinery, or play sports until your health care provider says it is okay.  Keep all follow-up appointments as directed by your health care provider.  Lie down right away if you start feeling like you might faint. Breathe deeply and steadily. Wait until all the symptoms have passed.  Drink enough fluids to keep your urine clear or pale yellow.  If you are taking blood pressure or heart medicine, get up slowly and take several minutes to sit and then stand. This can reduce dizziness. SEEK IMMEDIATE MEDICAL CARE IF:   You have a severe headache.  You have unusual pain in the chest, abdomen, or back.  You are bleeding from your mouth or rectum, or you have black or tarry stool.  You have an irregular or very fast heartbeat.  You have pain with breathing.  You have repeated fainting or seizure-like jerking during an episode.  You faint when sitting or lying down.  You have confusion.  You have  trouble walking.  You have severe weakness.  You have vision problems. If you fainted, call your local emergency services (911 in [place] Do not drive yourself to the hospital.    This information is not intended to replace advice given to you by your health care provider. Make sure you discuss any questions you have with your health care provider.   Document Released: 03/20/2005 Document Revised: 08/04/2014 Document Reviewed: 05/19/2011 [employer] Interactive Patient Education [employer]2016 [employer]

## 2015-09-07 NOTE — ED Provider Notes (Signed)
CSN: [id number]     Arrival date & time 09/07/15  1034 History   First MD Initiated Contact with Patient 09/07/15 1054     Chief Complaint  Patient presents with  . Loss of Consciousness  . Nausea  . Emesis     (Consider location/radiation/quality/duration/timing/severity/associated sxs/prior Treatment) HPI Comments: 60 year old female with history of hypertension, diabetes mellitus, chronic kidney disease presents after syncopal episode. The patient states that she felt well this morning when she got up. She took her dogs on a long walk without eating or drinking anything. She denies taking any of her medications prior to the walk. She said she goes and walks daily. She reports that she mainly drinks sweetened drinks but tries to drink for 16 ounce water drinks a day. She says she now feels fine but says that when she returned from the walk she suddenly felt lightheaded. She tried to continue to get dressed but then became extremely lightheaded and sat down on the floor. She said that she then believes she passed out she woke up on the floor. She said afterwards she was dry heaving. She denies any abdominal pain. No headache, chest pain, shortness of breath. No history of CHF.   Past Medical History  Diagnosis Date  . Hypertension   . Arthritis   . GERD (gastroesophageal reflux disease)   . Numbness in both hands     mostly at night  . Anxiety   . Diabetes mellitus without complication (HCC)     Type 2 on insulin pump  . Shortness of breath dyspnea     with exertion  . Anemia     has had to have iron infusions-sees dr Jonette Eva  . Iron deficiency anemia   . CKD (chronic kidney disease)   . Iron deficiency anemia 05/26/2015  . Iron malabsorption 05/26/2015   Past Surgical History  Procedure Laterality Date  . Hernia repair      umb hernia as child  . Colonoscopy    . Upper gi endoscopy    . Knee arthroscopy  02/12/2012    Procedure: ARTHROSCOPY KNEE;  Surgeon: Kerin Salen, MD;   Location: [place];  Service: Orthopedics;  Laterality: Right;  Partial Lateral Meniscectomy, Debridement chondromalacia  . Orif ankle fracture Right 02/11/2014    Procedure: OPEN REDUCTION INTERNAL FIXATION (ORIF) RIGHT ANKLE FRACTURE;  Surgeon: Kerin Salen, MD;  Location: [place];  Service: Orthopedics;  Laterality: Right;  . Eye surgery Bilateral     [facility]   . Total knee arthroplasty Right 08/05/2014  . Total knee arthroplasty Right 08/05/2014    Procedure: TOTAL KNEE ARTHROPLASTY;  Surgeon: Frederik Pear, MD;  Location: [place];  Service: Orthopedics;  Laterality: Right;   History reviewed. No pertinent family history. Social History  Substance Use Topics  . Smoking status: Former Smoker    Quit date: 02/08/1972  . Smokeless tobacco: Never Used  . Alcohol Use: No   OB History    No data available     Review of Systems  Constitutional: Negative for fever, chills and fatigue.  HENT: Negative for congestion, rhinorrhea, sinus pressure and sneezing.   Eyes: Negative for visual disturbance.  Respiratory: Negative for cough, chest tightness, shortness of breath and wheezing.   Cardiovascular: Negative for chest pain and palpitations.  Gastrointestinal: Positive for nausea and vomiting. Negative for abdominal pain, diarrhea and constipation.  Genitourinary: Negative for dysuria, urgency and hematuria.  Musculoskeletal: Negative for myalgias and back pain.  Skin:  Negative for rash.  Neurological: Positive for syncope and light-headedness. Negative for dizziness and weakness.  Hematological: Does not bruise/bleed easily.      Allergies  Lactose intolerance (gi)  Home Medications   Prior to Admission medications   Medication Sig Start Date End Date Taking? Authorizing Provider  albuterol (PROVENTIL HFA;VENTOLIN HFA) 108 (90 Base) MCG/ACT inhaler Inhale 2 puffs into the lungs every 6 (six) hours as needed for wheezing or shortness of breath.   Yes Historical Provider,  MD  aspirin EC 81 MG tablet Take 81 mg by mouth daily.   Yes Historical Provider, MD  atorvastatin (LIPITOR) 40 MG tablet Take 40 mg by mouth daily at 6 PM.   Yes Historical Provider, MD  CALCIUM PO Take 1 capsule by mouth at bedtime.   Yes Historical Provider, MD  COCONUT OIL PO Take 1 tablet by mouth daily.   Yes Historical Provider, MD  diphenhydrAMINE (BENADRYL) 25 MG tablet Take 25 mg by mouth every 6 (six) hours as needed for allergies.   Yes Historical Provider, MD  escitalopram (LEXAPRO) 10 MG tablet Take 10 mg by mouth daily.   Yes Historical Provider, MD  glucose blood (ONETOUCH VERIO) test strip Use as instructed to check blood sugar once a day dx code E11.65 02/04/14  Yes Elayne Snare, MD  Insulin Disposable Pump (V-GO 20) KIT Use one per day Patient taking differently: Use one per day - Humalog 01/28/14  Yes Elayne Snare, MD  insulin lispro (HUMALOG) 100 UNIT/ML injection 2-4 Units by Pump Prime route 3 (three) times daily with meals. Takes 3 units in the morning, 2 units at lunch, 4 units at supper   Yes Historical Provider, MD  JANUVIA 50 MG tablet Take 50 mg by mouth daily. 08/13/15  Yes Historical Provider, MD  lansoprazole (PREVACID) 30 MG capsule Take 30 mg by mouth daily.   Yes Historical Provider, MD  methocarbamol (ROBAXIN) 500 MG tablet Take 500 mg by mouth 2 (two) times daily as needed for muscle spasms.    Yes Historical Provider, MD  Jonetta Speak LANCETS FINE MISC Use to check blood sugar once a day dx code E11.65 01/28/14  Yes Elayne Snare, MD  phentermine (ADIPEX-P) 37.5 MG tablet Take 37.5 mg by mouth daily.   Yes Historical Provider, MD  POTASSIUM PO Take 2 tablets by mouth daily.   Yes Historical Provider, MD  valsartan-hydrochlorothiazide (DIOVAN-HCT) 80-12.5 MG per tablet Take 1 tablet by mouth daily.   Yes Historical Provider, MD  aspirin EC 325 MG tablet Take 1 tablet (325 mg total) by mouth 2 (two) times daily. Patient not taking: Reported on 09/07/2015 08/05/14   Leighton Parody, PA-C   BP 166/97 mmHg  Pulse 74  Temp(Src) 97.6 F (36.4 C) (Oral)  Resp 14  SpO2 100% Physical Exam  Constitutional: She is oriented to person, place, and time. She appears well-developed and well-nourished. No distress.  HENT:  Head: Normocephalic and atraumatic.  Right Ear: External ear normal.  Left Ear: External ear normal.  Nose: Nose normal.  Mouth/Throat: Oropharynx is clear and moist. No oropharyngeal exudate.  Eyes: EOM are normal. Pupils are equal, round, and reactive to light.  Neck: Normal range of motion. Neck supple.  Cardiovascular: Normal rate, regular rhythm, normal heart sounds and intact distal pulses.   No murmur heard. Pulmonary/Chest: Effort normal. No respiratory distress. She has no wheezes. She has no rales.  Abdominal: Soft. She exhibits no distension. There is no tenderness.  Musculoskeletal: Normal  range of motion. She exhibits no edema or tenderness.  Neurological: She is alert and oriented to person, place, and time. She has normal strength. No cranial nerve deficit or sensory deficit. She exhibits normal muscle tone. Coordination and gait normal.  Normal finger to nose on examination  Skin: Skin is warm and dry. No rash noted. She is not diaphoretic.  Vitals reviewed.   ED Course  Procedures (including critical care time) Labs Review Labs Reviewed  COMPREHENSIVE METABOLIC PANEL - Abnormal; Notable for the following:    Glucose, Bld 180 (*)    Creatinine, Ser 1.26 (*)    GFR calc non Af Amer 45 (*)    GFR calc Af Amer 52 (*)    All other components within normal limits  URINALYSIS, ROUTINE W REFLEX MICROSCOPIC (NOT AT [facility]) - Abnormal; Notable for the following:    Ketones, ur 15 (*)    Protein, ur 30 (*)    All other components within normal limits  URINE MICROSCOPIC-ADD ON - Abnormal; Notable for the following:    Bacteria, UA RARE (*)    Casts HYALINE CASTS (*)    All other components within normal limits  CBG MONITORING, ED -  Abnormal; Notable for the following:    Glucose-Capillary 190 (*)    All other components within normal limits  CBC WITH DIFFERENTIAL/PLATELET  I-STAT TROPOININ, ED    Imaging Review Ct Head Wo Contrast  09/07/2015  CLINICAL DATA:  Syncope with nausea and vomiting. Dizziness upon standing EXAM: CT HEAD WITHOUT CONTRAST TECHNIQUE: Contiguous axial images were obtained from the base of the skull through the vertex without intravenous contrast. COMPARISON:  Head CT December 29, 2013 and brain MRI and December 29, 2013 FINDINGS: Mild diffuse atrophy is stable. There is no intracranial mass, hemorrhage, extra-axial fluid collection, or midline shift. There is mild patchy small vessel disease in the centra semiovale bilaterally. There is no new gray-white compartment lesion. No acute infarct evident. The bony calvarium appears intact. The mastoid air cells are clear. No intraorbital lesions are evident. IMPRESSION: Atrophy with patchy periventricular small vessel disease. No intracranial mass, hemorrhage, or acute appearing infarct. Electronically Signed   By: William  Woodruff III M.D.   On: 09/07/2015 14:34   Dg Abd Acute W/chest  09/07/2015  CLINICAL DATA:  Abdominal pain with vomiting and diarrhea. Shortness of breath. EXAM: DG ABDOMEN ACUTE W/ 1V CHEST COMPARISON:  CT abdomen and pelvis April 03, 2009; chest radiograph December 29, 2013 FINDINGS: PA chest: Lungs are clear. Heart size and pulmonary vascularity are normal. There is a hiatal hernia present. No adenopathy. Supine and upright abdomen: There is diffuse stool throughout the colon. There is no bowel dilatation or air-fluid level suggesting obstruction. No free air. Small hiatal hernia. There are pelvic calcifications most likely representing phleboliths. IMPRESSION: Diffuse stool throughout colon. No bowel obstruction or free air. Small hiatal hernia. No edema or consolidation. Electronically Signed   By: William  Woodruff III M.D.   On:  09/07/2015 12:50   I have personally reviewed and evaluated these images and lab results as part of my medical decision-making.   EKG Interpretation   Date/Time:  Tuesday September 07 2015 11:52:00 EDT Ventricular Rate:  75 PR Interval:  149 QRS Duration: 85 QT Interval:  430 QTC Calculation: 480 R Axis:   35 Text Interpretation:  Sinus rhythm Confirmed by KNOTT MD, DANIEL ([id number]),  editor LOGAN, KIMBERLY ([id number]) on 09/07/2015 11:55:40 AM      MDM     Patient was seen and evaluated in stable condition. Acute abdominal series without acute process. CT head without acute process. Laboratory studies consistent with possible mild dehydration but otherwise unremarkable. Patient was given a fluid bolus and reported significant improvement afterwards. She had a benign examination. She was able to ambulate to the bathroom without difficulty. She was discharged home in stable condition with strict return precautions. By [facility] syncope rules she was low risk for serious event. Likely dehydration and lack of eating or drinking anything intervening to her symptoms this morning. She was instructed to keep herself well-hydrated and eat and drink before walking in the morning.  Final diagnoses:  None    1. Syncope    Harvel Quale, MD 09/07/15 1911

## 2015-09-29 ENCOUNTER — Ambulatory Visit: Payer: BLUE CROSS/BLUE SHIELD | Admitting: Hematology & Oncology

## 2015-09-29 ENCOUNTER — Other Ambulatory Visit: Payer: BLUE CROSS/BLUE SHIELD

## 2015-10-29 ENCOUNTER — Ambulatory Visit: Payer: Self-pay

## 2015-11-16 ENCOUNTER — Ambulatory Visit
Admission: RE | Admit: 2015-11-16 | Discharge: 2015-11-16 | Disposition: A | Discharge status: Home / Self Care | Payer: BLUE CROSS/BLUE SHIELD | Source: Ambulatory Visit

## 2015-11-16 DIAGNOSIS — Z1231 Encounter for screening mammogram for malignant neoplasm of breast: Secondary | ICD-10-CM

## 2015-12-27 ENCOUNTER — Other Ambulatory Visit (HOSPITAL_COMMUNITY): Payer: Self-pay | Admitting: Surgery

## 2016-01-12 ENCOUNTER — Ambulatory Visit (HOSPITAL_COMMUNITY)
Admission: RE | Admit: 2016-01-12 | Discharge: 2016-01-12 | Disposition: A | Discharge status: Home / Self Care | Payer: BLUE CROSS/BLUE SHIELD | Source: Ambulatory Visit | Attending: Surgery | Admitting: Surgery

## 2016-01-12 ENCOUNTER — Other Ambulatory Visit: Payer: Self-pay

## 2016-01-12 DIAGNOSIS — Z01810 Encounter for preprocedural cardiovascular examination: Secondary | ICD-10-CM | POA: Insufficient documentation

## 2016-01-12 DIAGNOSIS — K449 Diaphragmatic hernia without obstruction or gangrene: Secondary | ICD-10-CM | POA: Insufficient documentation

## 2016-01-12 DIAGNOSIS — I700 Atherosclerosis of aorta: Secondary | ICD-10-CM | POA: Diagnosis not present

## 2016-01-24 ENCOUNTER — Encounter: Payer: BLUE CROSS/BLUE SHIELD | Attending: Surgery | Admitting: Dietician

## 2016-01-24 DIAGNOSIS — Z713 Dietary counseling and surveillance: Secondary | ICD-10-CM | POA: Insufficient documentation

## 2016-01-24 DIAGNOSIS — E119 Type 2 diabetes mellitus without complications: Secondary | ICD-10-CM | POA: Insufficient documentation

## 2016-01-24 NOTE — Progress Notes (Signed)
  Pre-Op Assessment Visit:  Pre-Operative Sleeve Gastrectomy Surgery  Medical Nutrition Therapy:  Appt start time: 1125   End time:  1220.  Patient was seen on 01/24/2016 for Pre-Operative Nutrition Assessment. [facility] on 01/24/2016.   Preferred Learning Style:   No preference indicated   Learning Readiness:   Ready  Handouts given during visit include:  Pre-Op Goals Bariatric Surgery Protein Shakes   During the appointment today the following Pre-Op Goals were reviewed with the patient: Maintain or lose weight as instructed by your surgeon Make healthy food choices Begin to limit portion sizes Limited concentrated sugars and fried foods Keep fat/sugar in the single digits per serving on   food labels Practice CHEWING your food  (aim for 30 chews per bite or until applesauce consistency) Practice not drinking 15 minutes before, during, and 30 minutes after each meal/snack Avoid all carbonated beverages  Avoid/limit caffeinated beverages  Avoid all sugar-sweetened beverages Consume 3 meals per day; eat every 3-5 hours Make a list of non-food related activities Aim for 64-100 ounces of FLUID daily  Aim for at least 60-80 grams of PROTEIN daily Look for a liquid protein source that contain ?15 g protein and ?5 g carbohydrate  (ex: shakes, drinks, shots)   Demonstrated degree of understanding via:  Teach Back  Teaching Method Utilized:  Visual Auditory Hands on  Barriers to learning/adherence to lifestyle change: none  Patient to call the [facility] to enroll in Pre-Op and Post-Op Nutrition Education when surgery date is scheduled.

## 2016-01-24 NOTE — Patient Instructions (Signed)
Follow Pre-Op Goals Try Protein Shakes Call [facility] at [phone] when surgery is scheduled to enroll in Pre-Op Class  Things to remember:  Please always be honest with us. We want to support you!  If you have any questions or concerns in between appointments, please call or email Liz, Leslie, or Laurie.  The diet after surgery will be high protein and low in carbohydrate.  Vitamins and calcium need to be taken for the rest of your life.  Feel free to include support people in any classes or appointments.   Supplement recommendations:  Before Surgery   1 Complete Multivitamin with Iron  3000 IU Vitamin D3  After Surgery   2 Chewable Multivitamins  **Best Choice - Bariatric Advantage Advanced Multi EA      3 Chewable Calcium (500 mg each, total 1200-1500 mg per day)  **Best Choice - Celebrate, Bariatric Advantage, or Wellesse  Other Options:    2 Flinstones Complete + up to 100 mg Thiamin + 2000-3000 IU Vitamin D3 + 350-500 mcg Vitamin B12 + 30-45 mg Iron (with history of deficiency)  2 Celebrate MultiComplete with 18 mg Iron (this provides 6000 IU of  Vitamin D3)  4 Celebrate Essential Multi 2 in 1 (has calcium) + 18-60 mg separate  iron  Vitamins and Calcium are available at:    Outpatient Pharmacy   [address], [place], [place] [address]   [email or web address]  [email or web address]  [email or web address]   

## 2016-02-08 NOTE — Progress Notes (Signed)
Pt is being scheduled for preop appt; please place surgical orders in epic. Thanks.  

## 2016-02-28 ENCOUNTER — Encounter: Payer: BLUE CROSS/BLUE SHIELD | Attending: Surgery | Admitting: Dietician

## 2016-02-28 DIAGNOSIS — Z713 Dietary counseling and surveillance: Secondary | ICD-10-CM | POA: Diagnosis not present

## 2016-02-28 DIAGNOSIS — E119 Type 2 diabetes mellitus without complications: Secondary | ICD-10-CM | POA: Insufficient documentation

## 2016-02-29 ENCOUNTER — Encounter: Payer: Self-pay | Admitting: Dietician

## 2016-02-29 NOTE — Progress Notes (Signed)
  Pre-Operative Nutrition Class:  Appt start time: 3833   End time:  1830.  Patient was seen on 02/28/2016 for Pre-Operative Bariatric Surgery Education at the [facility].   Surgery date: 03/14/2016 Surgery type: sleeve gastrectomy [facility] weight at [facility]: 236 lbs on 01/24/2016 Weight today: 236 lbs  TANITA  BODY COMP RESULTS  02/28/16   BMI (kg/m^2) 38.8   Fat Mass (lbs) 114.8   Fat Free Mass (lbs) 121.8   Total Body Water (lbs) 88.2   Samples given per MNT protocol. Patient educated on appropriate usage: Bariatric Advantage Multivitamin (mixed fruit - qty 1) Lot #: [id number] Exp: 02/2017  Celebrate Vitamins Calcium Citrate chew (berry - qty 1) Lot #: [id number] Exp: 06/2017  Renee Pain Protein Powder (unflavored - qty 1) Lot #: [id number] Exp: 06/2017  Premier protein shake (vanilla - qty 1) Lot#: [id number] Exp: 11/2016  The following the learning objectives were met by the patient during this course:  Identify Pre-Op Dietary Goals and will begin 2 weeks pre-operatively  Identify appropriate sources of fluids and proteins   State protein recommendations and appropriate sources pre and post-operatively  Identify Post-Operative Dietary Goals and will follow for 2 weeks post-operatively  Identify appropriate multivitamin and calcium sources  Describe the need for physical activity post-operatively and will follow MD recommendations  State when to call healthcare provider regarding medication questions or post-operative complications  Handouts given during class include:  Pre-Op Bariatric Surgery Diet Handout  Protein Shake Handout  Post-Op Bariatric Surgery Nutrition Handout  [facility] Program Information Flyer  Support Group Information Flyer  [facility] Bariatric Supplements Price List  Follow-Up Plan: Patient will follow-up at [facility] 2 weeks post operatively for diet advancement per MD.

## 2016-03-01 ENCOUNTER — Ambulatory Visit: Payer: Self-pay | Admitting: Surgery

## 2016-03-01 NOTE — H&P (Signed)
Chief Complaint:  Obesity and GERD  History of Present Illness:  Karen Gardner is an 60 y.o. female who presents for sleeve gastrectomy.  Associated symptoms include heartburn (takes prevacid daily). Surgical history: ventral hernia repair (as a child). Her obesity dates back to childhood and her weight ballooned up to over 300 lbs. She is currently on an insuling pump with a weight of 232 (BMI 38). She has had her right ankle and then her knee repaired. She has GERD and upper GI shows hiatal hernia. Denies DVT  I explained both roux en Y and sleeve gastrectomy to her in detail. I told her that I would recommend roux en Y as it potentially would help her DM better than the sleeve but there is a group of DM patients for whom they don't respond. She would like to pursue sleeve gastrectomy and she is aware of the risk of leaking sleeve. She denies snoring or problems with OSA.   She wants a sleeve gastectomy and I have discussed this with her and explained and answered her questions.     Past Medical History:  Diagnosis Date  . Anemia    has had to have iron infusions-sees dr Jonette Eva  . Anxiety   . Arthritis   . CKD (chronic kidney disease)   . Diabetes mellitus without complication (HCC)    Type 2 on insulin pump  . GERD (gastroesophageal reflux disease)   . Hypertension   . Iron deficiency anemia   . Iron deficiency anemia 05/26/2015  . 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 Numbness in both hands    mostly at night  . Shortness of breath dyspnea    with exertion    Past Surgical History:  Procedure Laterality Date  . COLONOSCOPY    . EYE SURGERY Bilateral    [facility]   . HERNIA REPAIR     umb hernia as child  . KNEE ARTHROSCOPY  02/12/2012   Procedure: ARTHROSCOPY KNEE;  Surgeon: Kerin Salen, MD;  Location: [place];  Service: Orthopedics;  Laterality: Right;  Partial Lateral Meniscectomy, Debridement chondromalacia  . ORIF ANKLE FRACTURE Right 02/11/2014   Procedure: OPEN REDUCTION INTERNAL FIXATION (ORIF) RIGHT ANKLE FRACTURE;  Surgeon: Kerin Salen, MD;  Location: [place];  Service: Orthopedics;  Laterality: Right;  . TOTAL KNEE ARTHROPLASTY Right 08/05/2014  . TOTAL KNEE ARTHROPLASTY Right 08/05/2014   Procedure: TOTAL KNEE ARTHROPLASTY;  Surgeon: Frederik Pear, MD;  Location: [place];  Service: Orthopedics;  Laterality: Right;  . UPPER GI ENDOSCOPY      Current Outpatient Prescriptions  Medication Sig Dispense Refill  . albuterol (PROVENTIL HFA;VENTOLIN HFA) 108 (90 Base) MCG/ACT inhaler Inhale 2 puffs into the lungs every 6 (six) hours as needed for wheezing or shortness of breath.    Marland Kitchen aspirin EC 325 MG tablet Take 1 tablet (325 mg total) by mouth 2 (two) times daily. (Patient not taking: Reported on 09/07/2015) 30 tablet 0  . aspirin EC 81 MG tablet Take 81 mg by mouth daily.    Marland Kitchen atorvastatin (LIPITOR) 40 MG tablet Take 40 mg by mouth daily at 6 PM.    . CALCIUM PO Take 1 capsule by mouth at bedtime.    . COCONUT OIL PO Take 1 tablet by mouth daily.    . diphenhydrAMINE (BENADRYL) 25 MG tablet Take 25 mg by mouth every 6 (six) hours as needed for allergies.    Marland Kitchen escitalopram (LEXAPRO) 10 MG tablet Take 10 mg by mouth  daily.    . glucose blood (ONETOUCH VERIO) test strip Use as instructed to check blood sugar once a day dx code E11.65 50 each 2  . Insulin Disposable Pump (V-GO 20) KIT Use one per day (Patient taking differently: Use one per day - Humalog) 1 kit 2  . insulin lispro (HUMALOG) 100 UNIT/ML injection 2-4 Units by Pump Prime route 3 (three) times daily with meals. Takes 3 units in the morning, 2 units at lunch, 4 units at supper    . JANUVIA 50 MG tablet Take 50 mg by mouth daily.  1  . lansoprazole (PREVACID) 30 MG capsule Take 30 mg by mouth daily.    . methocarbamol (ROBAXIN) 500 MG tablet Take 500 mg by mouth 2 (two) times daily as needed for muscle spasms.     Glory Rosebush DELICA LANCETS FINE MISC Use to check blood sugar once a day  dx code E11.65 100 each 3  . phentermine (ADIPEX-P) 37.5 MG tablet Take 37.5 mg by mouth daily.    Marland Kitchen POTASSIUM PO Take 2 tablets by mouth daily.    . valsartan-hydrochlorothiazide (DIOVAN-HCT) 80-12.5 MG per tablet Take 1 tablet by mouth daily.     No current facility-administered medications for this visit.    Lactose intolerance (gi) No family history on file. Social History:   reports that she quit smoking about 44 years ago. She has never used smokeless tobacco. She reports that she does not drink alcohol or use drugs.   REVIEW OF SYSTEMS : Negative except for see problem list  Physical Exam:   There were no vitals taken for this visit. There is no height or weight on file to calculate BMI.  Gen:  WDWN AAF NAD  Neurological: Alert and oriented to person, place, and time. Motor and sensory function is grossly intact  Head: Normocephalic and atraumatic.  Eyes: Conjunctivae are normal. Pupils are equal, round, and reactive to light. No scleral icterus.  Neck: Normal range of motion. Neck supple. No tracheal deviation or thyromegaly present.  Cardiovascular:  SR without murmurs or gallops.  No carotid bruits Breast:  Not examined Respiratory: Effort normal.  No respiratory distress. No chest wall tenderness. Breath sounds normal.  No wheezes, rales or rhonchi.  Abdomen:  nontender GU:  Not examined Musculoskeletal: Normal range of motion. Extremities are nontender. No cyanosis, edema or clubbing noted Lymphadenopathy: No cervical, preauricular, postauricular or axillary adenopathy is present Skin: Skin is warm and dry. No rash noted. No diaphoresis. No erythema. No pallor. Pscyh: Normal mood and affect. Behavior is normal. Judgment and thought content normal.   LABORATORY RESULTS: No results found for this or any previous visit (from the past 48 hour(s)).   RADIOLOGY RESULTS: No results found.  Problem List: Patient Active Problem List   Diagnosis Date Noted  . Iron  deficiency anemia 05/26/2015  . 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 Primary osteoarthritis of right knee 08/05/2014  . Primary osteoarthritis of knee 08/05/2014  . Diabetes mellitus type 2, uncontrolled ([place]) 02/12/2014  . Essential hypertension 02/12/2014  . Ankle fracture, bimalleolar, closed 02/11/2014  . Essential hypertension, benign 01/28/2014  . Type II diabetes mellitus, uncontrolled ([place]) 01/21/2014    Assessment & Plan: GERD with HH and morbid obesity.  Plan repair of hiatal hernia and sleeve gastrectomy    Matt B. Hassell Done, MD, [facility] [phone] beeper [phone]  03/01/2016 2:59 PM

## 2016-03-07 ENCOUNTER — Ambulatory Visit: Payer: Self-pay | Admitting: Surgery

## 2016-03-10 ENCOUNTER — Encounter (HOSPITAL_COMMUNITY): Payer: Self-pay

## 2016-03-10 ENCOUNTER — Encounter (HOSPITAL_COMMUNITY)
Admission: RE | Admit: 2016-03-10 | Discharge: 2016-03-10 | Disposition: A | Discharge status: Home / Self Care | Payer: BLUE CROSS/BLUE SHIELD | Source: Ambulatory Visit | Attending: Surgery | Admitting: Surgery

## 2016-03-10 DIAGNOSIS — Z01812 Encounter for preprocedural laboratory examination: Secondary | ICD-10-CM | POA: Insufficient documentation

## 2016-03-10 DIAGNOSIS — E119 Type 2 diabetes mellitus without complications: Secondary | ICD-10-CM | POA: Insufficient documentation

## 2016-03-10 HISTORY — DX: Low back pain, unspecified: M54.50

## 2016-03-10 HISTORY — DX: Headache: R51

## 2016-03-10 HISTORY — DX: Allergic rhinitis, unspecified: J30.9

## 2016-03-10 HISTORY — DX: Obesity, unspecified: E66.9

## 2016-03-10 HISTORY — DX: Unspecified asthma, uncomplicated: J45.909

## 2016-03-10 HISTORY — DX: Pure hypercholesterolemia, unspecified: E78.00

## 2016-03-10 HISTORY — DX: Vitamin D deficiency, unspecified: E55.9

## 2016-03-10 HISTORY — DX: Headache, unspecified: R51.9

## 2016-03-10 HISTORY — DX: Other chronic pain: G89.29

## 2016-03-10 HISTORY — DX: Asymptomatic menopausal state: Z78.0

## 2016-03-10 HISTORY — DX: Low back pain: M54.5

## 2016-03-10 LAB — CBC WITH DIFFERENTIAL/PLATELET
Basophils Absolute: 0 10*3/uL (ref 0.0–0.1)
Basophils Relative: 0 %
Eosinophils Absolute: 0.1 10*3/uL (ref 0.0–0.7)
Eosinophils Relative: 1 %
HCT: 36.8 % (ref 36.0–46.0)
HEMOGLOBIN: 12.6 g/dL (ref 12.0–15.0)
LYMPHS ABS: 1.5 10*3/uL (ref 0.7–4.0)
LYMPHS PCT: 36 %
MCH: 29.1 pg (ref 26.0–34.0)
MCHC: 34.2 g/dL (ref 30.0–36.0)
MCV: 85 fL (ref 78.0–100.0)
Monocytes Absolute: 0.4 10*3/uL (ref 0.1–1.0)
Monocytes Relative: 10 %
NEUTROS PCT: 53 %
Neutro Abs: 2.2 10*3/uL (ref 1.7–7.7)
Platelets: 206 10*3/uL (ref 150–400)
RBC: 4.33 MIL/uL (ref 3.87–5.11)
RDW: 12.8 % (ref 11.5–15.5)
WBC: 4.1 10*3/uL (ref 4.0–10.5)

## 2016-03-10 LAB — COMPREHENSIVE METABOLIC PANEL
ALK PHOS: 66 U/L (ref 38–126)
ALT: 22 U/L (ref 14–54)
ANION GAP: 6 (ref 5–15)
AST: 27 U/L (ref 15–41)
Albumin: 4.2 g/dL (ref 3.5–5.0)
BILIRUBIN TOTAL: 0.8 mg/dL (ref 0.3–1.2)
BUN: 41 mg/dL — ABNORMAL HIGH (ref 6–20)
CALCIUM: 9.5 mg/dL (ref 8.9–10.3)
CO2: 29 mmol/L (ref 22–32)
CREATININE: 1.29 mg/dL — AB (ref 0.44–1.00)
Chloride: 103 mmol/L (ref 101–111)
GFR, EST AFRICAN AMERICAN: 50 mL/min — AB (ref >60)
GFR, EST NON AFRICAN AMERICAN: 43 mL/min — AB (ref >60)
Glucose, Bld: 72 mg/dL (ref 65–99)
Potassium: 4.6 mmol/L (ref 3.5–5.1)
Sodium: 138 mmol/L (ref 135–145)
TOTAL PROTEIN: 7.8 g/dL (ref 6.5–8.1)

## 2016-03-10 LAB — GLUCOSE, CAPILLARY: Glucose-Capillary: 99 mg/dL (ref 65–99)

## 2016-03-10 NOTE — Progress Notes (Addendum)
CMP results in epic per PAT visit 03/10/2016 sent to Dr Daphine DeutscherMartin Also [facility] Daph[facility] DeutscherMartin

## 2016-03-10 NOTE — Patient Instructions (Addendum)
Keona Bilyeu Campoy  03/10/2016   Your procedure is scheduled on: Tuesday March 14, 2016  Report to [address] [facility] Main  Entrance take [place]  elevators to 3rd floor to  Short Stay Center at 5:15 AM.  Call this number if you have problems the morning of surgery [phone]   Remember: ONLY 1 PERSON MAY GO WITH YOU TO SHORT STAY TO GET  READY MORNING OF YOUR SURGERY.  Do not eat food or drink liquids :After Midnight.     Take these medicines the morning of surgery with A SIP OF WATER: Escitalopram (Lexapro); Lansoprazole (Prevacid); May use albuterol inhaler if needed                How to Manage Your Diabetes Before and After Surgery  Why is it important to control my blood sugar before and after surgery? . Improving blood sugar levels before and after surgery helps healing and can limit problems. . A way of improving blood sugar control is eating a healthy diet by: o  Eating less sugar and carbohydrates o  Increasing activity/exercise o  Talking with your doctor about reaching your blood sugar goals . High blood sugars (greater than 180 mg/dL) can raise your risk of infections and slow your recovery, so you will need to focus on controlling your diabetes during the weeks before surgery. . Make sure that the doctor who takes care of your diabetes knows about your planned surgery including the date and location.  How do I manage my blood sugar before surgery? . Check your blood sugar at least 4 times a day, starting 2 days before surgery, to make sure that the level is not too high or low. o Check your blood sugar the morning of your surgery when you wake up and every 2 hours until you get to the Short Stay unit. . If your blood sugar is less than 70 mg/dL, you will need to treat for low blood sugar: o Do not take insulin. o Treat a low blood sugar (less than 70 mg/dL) with  cup of clear juice (cranberry or apple), 4 glucose tablets, OR glucose gel. o Recheck blood  sugar in 15 minutes after treatment (to make sure it is greater than 70 mg/dL). If your blood sugar is not greater than 70 mg/dL on recheck, call [phone] for further instructions. . Report your blood sugar to the short stay nurse when you get to Short Stay.  . If you are admitted to the hospital after surgery: o Your blood sugar will be checked by the staff and you will probably be given insulin after surgery (instead of oral diabetes medicines) to make sure you have good blood sugar levels. o The goal for blood sugar control after surgery is 80-180 mg/dL.   WHAT DO I DO ABOUT MY DIABETES MEDICATION?  Marland Kitchen Do not take oral diabetes medicines (pills) the morning of surgery.  . The day of surgery, do not take other diabetes injectables, including Byetta (exenatide), Bydureon (exenatide ER), Victoza (liraglutide), or Trulicity (dulaglutide).  . If your CBG is greater than 220 mg/dL, you may take  of your sliding scale  . (correction) dose of insulin.    For patients with insulin pumps: Contact your diabetes doctor for specific instructions before surgery.  Make sure to bring insulin pump supplies to the hospital with you in case the site needs to be changed.  Patient  Signature:  Date:   Nurse Signature:  Date:   Reviewed and Endorsed by [facility] Patient Education Committee, August 2015                                 You may not have any metal on your body including hair pins and              piercings  Do not wear jewelry, make-up, lotions, powders or perfumes, deodorant             Do not wear nail polish.  Do not shave  48 hours prior to surgery.     Do not bring valuables to the hospital. [facility] IS NOT             RESPONSIBLE   FOR VALUABLES.  Contacts, dentures or bridgework may not be worn into surgery.  Leave suitcase in the car. After surgery it may be brought to your room.    _____________________________________________________________________              [facility] - Preparing [facility] Before surgery, you can play an important role.  Because skin is not sterile, your skin needs to be as free of germs as possible.  You can reduce the number of germs on your skin by washing with CHG (chlorahexidine gluconate) soap before surgery.  CHG is an antiseptic cleaner which kills germs and bonds with the skin to continue killing germs even after washing. Please DO NOT use if you have an allergy to CHG or antibacterial soaps.  If your skin becomes reddened/irritated stop using the CHG and inform your nurse when you arrive at Short Stay. Do not shave (including legs and underarms) for at least 48 hours prior to the first CHG shower.  You may shave your face/neck. Please follow these instructions carefully:  1.  Shower with CHG Soap the night before surgery and the  morning of Surgery.  2.  If you choose to wash your hair, wash your hair first as usual with your  normal  shampoo.  3.  After you shampoo, rinse your hair and body thoroughly to remove the  shampoo.                           4.  Use CHG as you would any other liquid soap.  You can apply chg directly  to the skin and wash                       Gently with a scrungie or clean washcloth.  5.  Apply the CHG Soap to your body ONLY FROM THE NECK DOWN.   Do not use on face/ open                           Wound or open sores. Avoid contact with eyes, ears mouth and genitals (private parts).                       Wash face,  Genitals (private parts) with your normal soap.             6.  Wash thoroughly, paying special attention to the area where your surgery  will be performed.  7.  Thoroughly rinse your body with warm water from the neck  down.  8.  DO NOT shower/wash with your normal soap after using and rinsing off  the CHG Soap.                9.  Pat yourself dry with a clean towel.            10.  Wear clean pajamas.            11.  Place clean sheets on your bed the night of your first shower  and do not  sleep with pets. Day of Surgery : Do not apply any lotions/deodorants the morning of surgery.  Please wear clean clothes to the hospital/surgery center.  FAILURE TO FOLLOW THESE INSTRUCTIONS MAY RESULT IN THE CANCELLATION OF YOUR SURGERY PATIENT SIGNATURE_________________________________  NURSE SIGNATURE__________________________________  ________________________________________________________________________

## 2016-03-10 NOTE — Progress Notes (Signed)
[facility] 02/01/2016 with A1C report

## 2016-03-14 ENCOUNTER — Inpatient Hospital Stay (HOSPITAL_COMMUNITY): Payer: BLUE CROSS/BLUE SHIELD | Admitting: Registered Nurse

## 2016-03-14 ENCOUNTER — Encounter (HOSPITAL_COMMUNITY)
Admission: RE | Disposition: A | Discharge status: Home / Self Care | Payer: Self-pay | Source: Ambulatory Visit | Attending: Surgery

## 2016-03-14 ENCOUNTER — Inpatient Hospital Stay (HOSPITAL_COMMUNITY)
Admission: RE | Admit: 2016-03-14 | Discharge: 2016-03-16 | DRG: 620 | Disposition: A | Discharge status: Home / Self Care | Payer: BLUE CROSS/BLUE SHIELD | Source: Ambulatory Visit | Attending: Surgery | Admitting: Surgery

## 2016-03-14 ENCOUNTER — Encounter (HOSPITAL_COMMUNITY): Payer: Self-pay | Admitting: *Deleted

## 2016-03-14 DIAGNOSIS — E119 Type 2 diabetes mellitus without complications: Secondary | ICD-10-CM | POA: Diagnosis present

## 2016-03-14 DIAGNOSIS — D649 Anemia, unspecified: Secondary | ICD-10-CM | POA: Diagnosis present

## 2016-03-14 DIAGNOSIS — J449 Chronic obstructive pulmonary disease, unspecified: Secondary | ICD-10-CM | POA: Diagnosis present

## 2016-03-14 DIAGNOSIS — I10 Essential (primary) hypertension: Secondary | ICD-10-CM | POA: Diagnosis present

## 2016-03-14 DIAGNOSIS — K449 Diaphragmatic hernia without obstruction or gangrene: Secondary | ICD-10-CM | POA: Diagnosis present

## 2016-03-14 DIAGNOSIS — Z23 Encounter for immunization: Secondary | ICD-10-CM | POA: Diagnosis not present

## 2016-03-14 DIAGNOSIS — Z794 Long term (current) use of insulin: Secondary | ICD-10-CM | POA: Diagnosis not present

## 2016-03-14 DIAGNOSIS — Q398 Other congenital malformations of esophagus: Secondary | ICD-10-CM

## 2016-03-14 DIAGNOSIS — K219 Gastro-esophageal reflux disease without esophagitis: Secondary | ICD-10-CM | POA: Diagnosis present

## 2016-03-14 DIAGNOSIS — Z9884 Bariatric surgery status: Secondary | ICD-10-CM

## 2016-03-14 DIAGNOSIS — Z96659 Presence of unspecified artificial knee joint: Secondary | ICD-10-CM | POA: Diagnosis not present

## 2016-03-14 DIAGNOSIS — Z7982 Long term (current) use of aspirin: Secondary | ICD-10-CM

## 2016-03-14 DIAGNOSIS — Z87891 Personal history of nicotine dependence: Secondary | ICD-10-CM

## 2016-03-14 HISTORY — PX: LAPAROSCOPIC GASTRIC SLEEVE RESECTION: SHX5895

## 2016-03-14 LAB — CREATININE, SERUM
Creatinine, Ser: 1.38 mg/dL — ABNORMAL HIGH (ref 0.44–1.00)
GFR, EST AFRICAN AMERICAN: 46 mL/min — AB (ref >60)
GFR, EST NON AFRICAN AMERICAN: 40 mL/min — AB (ref >60)

## 2016-03-14 LAB — CBC
HEMATOCRIT: 36.2 % (ref 36.0–46.0)
Hemoglobin: 11.7 g/dL — ABNORMAL LOW (ref 12.0–15.0)
MCH: 28.8 pg (ref 26.0–34.0)
MCHC: 32.3 g/dL (ref 30.0–36.0)
MCV: 89.2 fL (ref 78.0–100.0)
PLATELETS: 170 10*3/uL (ref 150–400)
RBC: 4.06 MIL/uL (ref 3.87–5.11)
RDW: 13.3 % (ref 11.5–15.5)
WBC: 9.3 10*3/uL (ref 4.0–10.5)

## 2016-03-14 LAB — GLUCOSE, CAPILLARY
GLUCOSE-CAPILLARY: 79 mg/dL (ref 65–99)
GLUCOSE-CAPILLARY: 138 mg/dL — AB (ref 65–99)
GLUCOSE-CAPILLARY: 179 mg/dL — AB (ref 65–99)
Glucose-Capillary: 75 mg/dL (ref 65–99)
Glucose-Capillary: 165 mg/dL — ABNORMAL HIGH (ref 65–99)
Glucose-Capillary: 252 mg/dL — ABNORMAL HIGH (ref 65–99)
Glucose-Capillary: 301 mg/dL — ABNORMAL HIGH (ref 65–99)

## 2016-03-14 SURGERY — GASTRECTOMY, SLEEVE, LAPAROSCOPIC
Anesthesia: General | Site: Abdomen

## 2016-03-14 MED ORDER — ROCURONIUM BROMIDE 50 MG/5ML IV SOSY
PREFILLED_SYRINGE | INTRAVENOUS | Status: AC
  Filled 2016-03-14: qty 5

## 2016-03-14 MED ORDER — ACETAMINOPHEN 160 MG/5ML PO SOLN: 325.0000 mg | ORAL | Status: DC | PRN

## 2016-03-14 MED ORDER — MIDAZOLAM HCL 2 MG/2ML IJ SOLN
INTRAMUSCULAR | Status: AC
  Filled 2016-03-14: qty 2

## 2016-03-14 MED ORDER — SUGAMMADEX SODIUM 500 MG/5ML IV SOLN
INTRAVENOUS | Status: AC
Start: 2016-03-14 — End: 2016-03-14
  Filled 2016-03-14: qty 5

## 2016-03-14 MED ORDER — CHLORHEXIDINE GLUCONATE CLOTH 2 % EX PADS: 6.0000 | MEDICATED_PAD | Freq: Once | CUTANEOUS | Status: DC

## 2016-03-14 MED ORDER — 0.9 % SODIUM CHLORIDE (POUR BTL) OPTIME
TOPICAL | Status: DC | PRN
  Administered 2016-03-14: 1000 mL

## 2016-03-14 MED ORDER — APREPITANT 80 MG PO CAPS
80.0000 mg | ORAL_CAPSULE | ORAL | Status: AC
  Administered 2016-03-14: 80 mg via ORAL
  Filled 2016-03-14: qty 1

## 2016-03-14 MED ORDER — SODIUM CHLORIDE 0.9 % IJ SOLN
INTRAMUSCULAR | Status: AC
  Filled 2016-03-14: qty 10

## 2016-03-14 MED ORDER — KETAMINE HCL 10 MG/ML IJ SOLN
INTRAMUSCULAR | Status: AC
  Filled 2016-03-14: qty 1

## 2016-03-14 MED ORDER — MORPHINE SULFATE (PF) 2 MG/ML IV SOLN
2.0000 mg | INTRAVENOUS | Status: DC | PRN
  Administered 2016-03-14 (×2): 2 mg via INTRAVENOUS
  Filled 2016-03-14 (×2): qty 1

## 2016-03-14 MED ORDER — SUGAMMADEX SODIUM 200 MG/2ML IV SOLN
INTRAVENOUS | Status: DC | PRN
  Administered 2016-03-14: 400 mg via INTRAVENOUS

## 2016-03-14 MED ORDER — LIDOCAINE 2% (20 MG/ML) 5 ML SYRINGE
INTRAMUSCULAR | Status: AC
  Filled 2016-03-14: qty 5

## 2016-03-14 MED ORDER — PROPOFOL 10 MG/ML IV BOLUS
INTRAVENOUS | Status: DC | PRN
  Administered 2016-03-14: 200 mg via INTRAVENOUS

## 2016-03-14 MED ORDER — ORAL CARE MOUTH RINSE
15.0000 mL | Freq: Two times a day (BID) | OROMUCOSAL | Status: DC
  Administered 2016-03-14 – 2016-03-15 (×4): 15 mL via OROMUCOSAL

## 2016-03-14 MED ORDER — BUPIVACAINE LIPOSOME 1.3 % IJ SUSP
20.0000 mL | Freq: Once | INTRAMUSCULAR | Status: AC
  Administered 2016-03-14: 17 mL
  Filled 2016-03-14: qty 20

## 2016-03-14 MED ORDER — CEFOTETAN DISODIUM-DEXTROSE 2-2.08 GM-% IV SOLR
2.0000 g | INTRAVENOUS | Status: AC
  Administered 2016-03-14: 2 g via INTRAVENOUS

## 2016-03-14 MED ORDER — ROCURONIUM BROMIDE 10 MG/ML (PF) SYRINGE
PREFILLED_SYRINGE | INTRAVENOUS | Status: DC | PRN
  Administered 2016-03-14: 20 mg via INTRAVENOUS
  Administered 2016-03-14: 50 mg via INTRAVENOUS
  Administered 2016-03-14: 20 mg via INTRAVENOUS

## 2016-03-14 MED ORDER — MIDAZOLAM HCL 5 MG/5ML IJ SOLN
INTRAMUSCULAR | Status: DC | PRN
  Administered 2016-03-14: 2 mg via INTRAVENOUS

## 2016-03-14 MED ORDER — PANTOPRAZOLE SODIUM 40 MG IV SOLR
40.0000 mg | Freq: Every day | INTRAVENOUS | Status: DC
  Administered 2016-03-14 – 2016-03-15 (×2): 40 mg via INTRAVENOUS
  Filled 2016-03-14 (×2): qty 40

## 2016-03-14 MED ORDER — EPHEDRINE SULFATE-NACL 50-0.9 MG/10ML-% IV SOSY
PREFILLED_SYRINGE | INTRAVENOUS | Status: DC | PRN
  Administered 2016-03-14 (×2): 10 mg via INTRAVENOUS

## 2016-03-14 MED ORDER — SODIUM CHLORIDE 0.9 % IJ SOLN
INTRAMUSCULAR | Status: DC | PRN
  Administered 2016-03-14: 6 mL

## 2016-03-14 MED ORDER — HYDROMORPHONE HCL 1 MG/ML IJ SOLN
0.2500 mg | INTRAMUSCULAR | Status: DC | PRN
Start: 2016-03-14 — End: 2016-03-14

## 2016-03-14 MED ORDER — MEPERIDINE HCL 50 MG/ML IJ SOLN: 6.2500 mg | INTRAMUSCULAR | Status: DC | PRN

## 2016-03-14 MED ORDER — KCL IN DEXTROSE-NACL 20-5-0.45 MEQ/L-%-% IV SOLN
INTRAVENOUS | Status: DC
  Administered 2016-03-14 – 2016-03-16 (×4): via INTRAVENOUS
  Filled 2016-03-14 (×5): qty 1000

## 2016-03-14 MED ORDER — PNEUMOCOCCAL VAC POLYVALENT 25 MCG/0.5ML IJ INJ
0.5000 mL | INJECTION | INTRAMUSCULAR | Status: DC
  Filled 2016-03-14: qty 0.5

## 2016-03-14 MED ORDER — FENTANYL CITRATE (PF) 100 MCG/2ML IJ SOLN
INTRAMUSCULAR | Status: DC | PRN
  Administered 2016-03-14: 50 ug via INTRAVENOUS
  Administered 2016-03-14: 250 ug via INTRAVENOUS
  Administered 2016-03-14: 50 ug via INTRAVENOUS

## 2016-03-14 MED ORDER — HEPARIN SODIUM (PORCINE) 5000 UNIT/ML IJ SOLN
5000.0000 [IU] | Freq: Three times a day (TID) | INTRAMUSCULAR | Status: DC
  Administered 2016-03-14 – 2016-03-16 (×6): 5000 [IU] via SUBCUTANEOUS
  Filled 2016-03-14 (×6): qty 1

## 2016-03-14 MED ORDER — PROMETHAZINE HCL 25 MG/ML IJ SOLN
6.2500 mg | INTRAMUSCULAR | Status: DC | PRN
  Administered 2016-03-14: 12.5 mg via INTRAVENOUS

## 2016-03-14 MED ORDER — LACTATED RINGERS IV SOLN
INTRAVENOUS | Status: DC | PRN
  Administered 2016-03-14 (×2): via INTRAVENOUS

## 2016-03-14 MED ORDER — MIDAZOLAM HCL 2 MG/2ML IJ SOLN: 0.5000 mg | Freq: Once | INTRAMUSCULAR | Status: DC | PRN

## 2016-03-14 MED ORDER — PROMETHAZINE HCL 25 MG/ML IJ SOLN
INTRAMUSCULAR | Status: AC
  Filled 2016-03-14: qty 1

## 2016-03-14 MED ORDER — LIDOCAINE 2% (20 MG/ML) 5 ML SYRINGE
INTRAMUSCULAR | Status: DC | PRN
  Administered 2016-03-14: 20 mg via INTRAVENOUS

## 2016-03-14 MED ORDER — DEXAMETHASONE SODIUM PHOSPHATE 10 MG/ML IJ SOLN
INTRAMUSCULAR | Status: AC
  Filled 2016-03-14: qty 1

## 2016-03-14 MED ORDER — HEPARIN SODIUM (PORCINE) 5000 UNIT/ML IJ SOLN
5000.0000 [IU] | INTRAMUSCULAR | Status: AC
  Administered 2016-03-14: 5000 [IU] via SUBCUTANEOUS
  Filled 2016-03-14: qty 1

## 2016-03-14 MED ORDER — PHENYLEPHRINE 40 MCG/ML (10ML) SYRINGE FOR IV PUSH (FOR BLOOD PRESSURE SUPPORT)
PREFILLED_SYRINGE | INTRAVENOUS | Status: AC
  Filled 2016-03-14: qty 10

## 2016-03-14 MED ORDER — FENTANYL CITRATE (PF) 100 MCG/2ML IJ SOLN
INTRAMUSCULAR | Status: AC
  Filled 2016-03-14: qty 2

## 2016-03-14 MED ORDER — ACETAMINOPHEN 160 MG/5ML PO SOLN
650.0000 mg | ORAL | Status: DC | PRN
  Administered 2016-03-15: 650 mg via ORAL
  Filled 2016-03-14: qty 20.3

## 2016-03-14 MED ORDER — PREMIER PROTEIN SHAKE
2.0000 [oz_av] | ORAL | Status: DC
  Administered 2016-03-16 (×3): 2 [oz_av] via ORAL

## 2016-03-14 MED ORDER — EPHEDRINE 5 MG/ML INJ
INTRAVENOUS | Status: AC
  Filled 2016-03-14: qty 10

## 2016-03-14 MED ORDER — PHENYLEPHRINE 40 MCG/ML (10ML) SYRINGE FOR IV PUSH (FOR BLOOD PRESSURE SUPPORT)
PREFILLED_SYRINGE | INTRAVENOUS | Status: DC | PRN
  Administered 2016-03-14: 120 ug via INTRAVENOUS
  Administered 2016-03-14: 80 ug via INTRAVENOUS

## 2016-03-14 MED ORDER — CEFOTETAN DISODIUM-DEXTROSE 2-2.08 GM-% IV SOLR
INTRAVENOUS | Status: AC
  Filled 2016-03-14: qty 50

## 2016-03-14 MED ORDER — INSULIN ASPART 100 UNIT/ML ~~LOC~~ SOLN
SUBCUTANEOUS | Status: AC
  Filled 2016-03-14: qty 1

## 2016-03-14 MED ORDER — ONDANSETRON HCL 4 MG/2ML IJ SOLN
4.0000 mg | INTRAMUSCULAR | Status: DC | PRN
  Administered 2016-03-15 (×2): 4 mg via INTRAVENOUS
  Filled 2016-03-14 (×2): qty 2

## 2016-03-14 MED ORDER — INSULIN ASPART 100 UNIT/ML ~~LOC~~ SOLN
0.0000 [IU] | SUBCUTANEOUS | Status: DC
  Administered 2016-03-14: 15 [IU] via SUBCUTANEOUS
  Administered 2016-03-14: 11 [IU] via SUBCUTANEOUS
  Administered 2016-03-14 – 2016-03-15 (×2): 4 [IU] via SUBCUTANEOUS
  Administered 2016-03-15: 7 [IU] via SUBCUTANEOUS
  Administered 2016-03-15: 11 [IU] via SUBCUTANEOUS
  Administered 2016-03-15: 7 [IU] via SUBCUTANEOUS
  Administered 2016-03-15: 4 [IU] via SUBCUTANEOUS
  Administered 2016-03-15: 11 [IU] via SUBCUTANEOUS
  Administered 2016-03-16 (×3): 4 [IU] via SUBCUTANEOUS

## 2016-03-14 MED ORDER — INSULIN GLARGINE 100 UNIT/ML ~~LOC~~ SOLN
10.0000 [IU] | Freq: Every day | SUBCUTANEOUS | Status: DC
  Administered 2016-03-14 – 2016-03-15 (×2): 10 [IU] via SUBCUTANEOUS
  Filled 2016-03-14 (×3): qty 0.1

## 2016-03-14 MED ORDER — PROPOFOL 10 MG/ML IV BOLUS
INTRAVENOUS | Status: AC
  Filled 2016-03-14: qty 40

## 2016-03-14 MED ORDER — DEXAMETHASONE SODIUM PHOSPHATE 10 MG/ML IJ SOLN
INTRAMUSCULAR | Status: DC | PRN
  Administered 2016-03-14: 10 mg via INTRAVENOUS

## 2016-03-14 MED ORDER — CHLORHEXIDINE GLUCONATE 0.12 % MT SOLN
15.0000 mL | Freq: Two times a day (BID) | OROMUCOSAL | Status: DC
  Administered 2016-03-14 – 2016-03-16 (×4): 15 mL via OROMUCOSAL
  Filled 2016-03-14 (×4): qty 15

## 2016-03-14 MED ORDER — FENTANYL CITRATE (PF) 250 MCG/5ML IJ SOLN
INTRAMUSCULAR | Status: AC
  Filled 2016-03-14: qty 5

## 2016-03-14 MED ORDER — ONDANSETRON HCL 4 MG/2ML IJ SOLN
INTRAMUSCULAR | Status: AC
  Filled 2016-03-14: qty 2

## 2016-03-14 MED ORDER — LACTATED RINGERS IR SOLN
Status: DC | PRN
  Administered 2016-03-14: 3000 mL

## 2016-03-14 MED ORDER — ONDANSETRON HCL 4 MG/2ML IJ SOLN
INTRAMUSCULAR | Status: DC | PRN
  Administered 2016-03-14: 4 mg via INTRAVENOUS

## 2016-03-14 MED ORDER — OXYCODONE HCL 5 MG/5ML PO SOLN
5.0000 mg | ORAL | Status: DC | PRN
  Administered 2016-03-15 (×2): 10 mg via ORAL
  Filled 2016-03-14 (×3): qty 10

## 2016-03-14 SURGICAL SUPPLY — 62 items
APPLICATOR COTTON TIP 6IN STRL (MISCELLANEOUS) IMPLANT
APPLIER CLIP 5 13 M/L LIGAMAX5 (MISCELLANEOUS)
APPLIER CLIP ROT 10 11.4 M/L (STAPLE)
APPLIER CLIP ROT 13.4 12 LRG (CLIP)
BLADE SURG 15 STRL LF DISP TIS (BLADE) ×1 IMPLANT
BLADE SURG 15 STRL SS (BLADE) ×1
CABLE HIGH FREQUENCY MONO STRZ (ELECTRODE) ×2 IMPLANT
CLIP APPLIE 5 13 M/L LIGAMAX5 (MISCELLANEOUS) IMPLANT
CLIP APPLIE ROT 10 11.4 M/L (STAPLE) IMPLANT
CLIP APPLIE ROT 13.4 12 LRG (CLIP) IMPLANT
COVER SURGICAL LIGHT HANDLE (MISCELLANEOUS) ×2 IMPLANT
DERMABOND ADVANCED (GAUZE/BANDAGES/DRESSINGS) ×1
DERMABOND ADVANCED .7 DNX12 (GAUZE/BANDAGES/DRESSINGS) ×1 IMPLANT
DEVICE SUT QUICK LOAD TK 5 (STAPLE) ×8 IMPLANT
DEVICE SUT TI-KNOT TK 5X26 (MISCELLANEOUS) ×2 IMPLANT
DEVICE SUTURE ENDOST 10MM (ENDOMECHANICALS) ×2 IMPLANT
DEVICE TROCAR PUNCTURE CLOSURE (ENDOMECHANICALS) ×4 IMPLANT
DISSECTOR BLUNT TIP ENDO 5MM (MISCELLANEOUS) ×2 IMPLANT
ELECT REM PT RETURN 9FT ADLT (ELECTROSURGICAL) ×2
ELECTRODE REM PT RTRN 9FT ADLT (ELECTROSURGICAL) ×1 IMPLANT
GAUZE SPONGE 4X4 12PLY STRL (GAUZE/BANDAGES/DRESSINGS) IMPLANT
GLOVE BIOGEL M 8.0 STRL (GLOVE) ×2 IMPLANT
GOWN STRL REUS W/TWL XL LVL3 (GOWN DISPOSABLE) ×8 IMPLANT
HANDLE STAPLE EGIA 4 XL (STAPLE) ×2 IMPLANT
HOVERMATT SINGLE USE (MISCELLANEOUS) ×2 IMPLANT
IRRIG SUCT STRYKERFLOW 2 WTIP (MISCELLANEOUS) ×2
IRRIGATION SUCT STRKRFLW 2 WTP (MISCELLANEOUS) ×1 IMPLANT
KIT BASIN OR (CUSTOM PROCEDURE TRAY) ×2 IMPLANT
MARKER SKIN DUAL TIP RULER LAB (MISCELLANEOUS) ×2 IMPLANT
NEEDLE SPNL 22GX3.5 QUINCKE BK (NEEDLE) ×2 IMPLANT
PACK UNIVERSAL I (CUSTOM PROCEDURE TRAY) ×2 IMPLANT
POUCH SPECIMEN RETRIEVAL 10MM (ENDOMECHANICALS) IMPLANT
RELOAD TRI 45 ART MED THCK BLK (STAPLE) ×2 IMPLANT
RELOAD TRI 45 ART MED THCK PUR (STAPLE) ×4 IMPLANT
RELOAD TRI 60 ART MED THCK BLK (STAPLE) ×2 IMPLANT
RELOAD TRI 60 ART MED THCK PUR (STAPLE) ×6 IMPLANT
SCISSORS LAP 5X45 EPIX DISP (ENDOMECHANICALS) IMPLANT
SEALANT SURGICAL APPL DUAL CAN (MISCELLANEOUS) IMPLANT
SHEARS HARMONIC ACE PLUS 45CM (MISCELLANEOUS) ×2 IMPLANT
SLEEVE ADV FIXATION 5X100MM (TROCAR) ×4 IMPLANT
SLEEVE GASTRECTOMY 36FR VISIGI (MISCELLANEOUS) ×2 IMPLANT
SOLUTION ANTI FOG 6CC (MISCELLANEOUS) ×2 IMPLANT
SPONGE LAP 18X18 X RAY DECT (DISPOSABLE) ×2 IMPLANT
STAPLER VISISTAT 35W (STAPLE) ×2 IMPLANT
SUT SURGIDAC NAB ES-9 0 48 120 (SUTURE) ×8 IMPLANT
SUT VIC AB 4-0 SH 18 (SUTURE) ×2 IMPLANT
SUT VICRYL 0 TIES 12 18 (SUTURE) ×2 IMPLANT
SYR 10ML ECCENTRIC (SYRINGE) ×2 IMPLANT
SYR 20CC LL (SYRINGE) ×2 IMPLANT
SYR 50ML LL SCALE MARK (SYRINGE) ×2 IMPLANT
SYSTEM WECK SHIELD CLOSURE (TROCAR) IMPLANT
TOWEL OR 17X26 10 PK STRL BLUE (TOWEL DISPOSABLE) ×4 IMPLANT
TOWEL OR NON WOVEN STRL DISP B (DISPOSABLE) ×2 IMPLANT
TRAY FOLEY W/METER SILVER 16FR (SET/KITS/TRAYS/PACK) IMPLANT
TROCAR ADV FIXATION 12X100MM (TROCAR) ×2 IMPLANT
TROCAR ADV FIXATION 5X100MM (TROCAR) ×2 IMPLANT
TROCAR BLADELESS 15MM (ENDOMECHANICALS) ×2 IMPLANT
TROCAR BLADELESS OPT 5 100 (ENDOMECHANICALS) ×2 IMPLANT
TUBE CALIBRATION LAPBAND (TUBING) ×2 IMPLANT
TUBING CONNECTING 10 (TUBING) ×2 IMPLANT
TUBING ENDO SMARTCAP (MISCELLANEOUS) ×2 IMPLANT
TUBING INSUF HEATED (TUBING) ×2 IMPLANT

## 2016-03-14 NOTE — Op Note (Signed)
Surgeon: Wenda LowMatt Seanpaul Preece, MD, FACS  Asst:  Ovidio Kinavid Newman, MD, FACS  Anes:  General endotracheal  Procedure: Laparoscopic repair of moderately large hiatal hernia, Laparoscopic sleeve gastrectomy and upper endoscopy  Diagnosis: Morbid obesity  Complications: none  EBL:   12 cc  Description of Procedure:  The patient was take to OR 1 and given general anesthesia.  The abdomen was prepped with Technicare and draped sterilely.  A timeout was performed.  Access to the abdomen was achieved with a 5 mm Optiview through the left upper quadrant.  Following insufflation, the state of the abdomen was found to be relatively free of adhesions but there was a prominent hiatal hernia present with large anterior defect.  This was dissected posteriorly and a four suture closure effected.  This caused the balloon to hang up on subsequent test so we felt that the closure was gee..  The ViSiGi 36Fr tube was inserted to deflate the stomach and was pulled back into the esophagus.    The pylorus was identified and we measured 5 cm back and marked the antrum.  At that point we began dissection to take down the greater curvature of the stomach using the Harmonic scalpel.  This dissection was taken all the way up to the left crus.  Posterior attachments of the stomach were also taken down.    The ViSiGi tube was then passed into the antrum and suction applied so that it was snug along the lessor curvature.  The "crow's foot" or incisura was identified.  The sleeve gastrectomy was begun using the [employer] International[employer]ovidien platform stapler beginning with a 4.5 cm black with TRS followed by a 6 cm black with TRS.  Subsequent applications of the purple with TRS were performed.  .  When the sleeve was complete the tube was taken off suction and insufflated briefly.  The tube was withdrawn.  Upper endoscopy was then performed by Dr. Ezzard StandingNewman.     The specimen was extracted through the 15 trocar site.  Wounds were infiltrated with Exparel and  closed with Monocryl 4-0.  Fascia on the 15 was closed with 0 vicryl with Endoclose.    Karen B. Daphine DeutscherMartin, MD, [facility] [place], [place] [phone]

## 2016-03-14 NOTE — Op Note (Signed)
Name:  Karen Gardner MRN: [id number] Date of Surgery: 03/14/2016  Preop Diagnosis:  Morbid Obesity  Postop Diagnosis:  Morbid Obesity, S/P Gastric Sleeve and repair of hiatal hernai  Procedure:  Upper endoscopy  (Intraoperative)  Surgeon:  Ovidio Kinavid Dejanira Pamintuan, M.D.  Anesthesia:  GET  Indications for procedure: Karen Gardner is a 60 y.o. female whose primary care physician is Dolan AmenNORWOOD, DOROTHY, MD and has completed a Gastric Sleeve today by Dr. Daphine DeutscherMartin.  I am doing an intraoperative upper endoscopy to evaluate the gastric pouch.  Operative Note: The patient is under general anesthesia.  Dr. Daphine DeutscherMartin is laparoscoping the patient while I do an upper endoscopy to evaluate the stomach pouch.  With the patient intubated, I passed the Olympus upper endoscope without difficulty down the esophagus.  The esophagus was unremarkable.  The esophago-gastric junction was at 32 cm.   The patient had a short esophagus and appeared to have about 3 cm of stomach above the diaphragm as representing a hiatal hernia.   The mucosa of the stomach looked viable and the staple line was intact without bleeding.  I advanced to the pylorus, but did not go through it.  While I insufflated the stomach pouch with air, Dr. Daphine DeutscherMartin  flooded the upper abdomen with saline to put the gastric pouch under saline.  There was no bubbling or evidence of a leak.  There was no evidence of narrowing of the pouch and the gastric sleeve looked tubular.  The scope was then withdrawn.  The esophagus was unremarkable and the patient tolerated the endoscopy without difficulty.  Ovidio Kinavid Kelty Szafran, MD, [facility] [place] [facility] Pager: [phone] Office phone:  [phone]

## 2016-03-14 NOTE — Transfer of Care (Signed)
Immediate Anesthesia Transfer of Care Note  Patient: Karen Gardner  Procedure(s) Performed: Procedure(s): LAPAROSCOPIC GASTRIC SLEEVE RESECTION, WITH REPAIR OF HIATAL HERNIA REPAI, AND UPPER ENDO (N/A)  Patient Location: PACU  Anesthesia Type:General  Level of Consciousness:  sedated, patient cooperative and responds to stimulation  Airway & Oxygen Therapy:Patient Spontanous Breathing and Patient connected to face mask oxgen  Post-op Assessment:  Report given to PACU RN and Post -op Vital signs reviewed and stable  Post vital signs:  Reviewed and stable  Last Vitals:  Vitals:   03/14/16 0559  BP: (!) 144/80  Pulse: 83  Resp: 18  Temp: 36.7 C    Complications: No apparent anesthesia complications

## 2016-03-14 NOTE — Discharge Instructions (Addendum)

## 2016-03-14 NOTE — Anesthesia Postprocedure Evaluation (Signed)
Anesthesia Post Note  Patient: Karen Gardner  Procedure(s) Performed: Procedure(s) (LRB): LAPAROSCOPIC GASTRIC SLEEVE RESECTION, WITH REPAIR OF HIATAL HERNIA REPAI, AND UPPER ENDO (N/A)  Patient location during evaluation: PACU Anesthesia Type: General Level of consciousness: awake and alert, oriented and patient cooperative Pain management: pain level controlled Vital Signs Assessment: post-procedure vital signs reviewed and stable Respiratory status: spontaneous breathing, nonlabored ventilation, respiratory function stable and patient connected to nasal cannula oxygen Cardiovascular status: blood pressure returned to baseline and stable Postop Assessment: no signs of nausea or vomiting Anesthetic complications: no    Last Vitals:  Vitals:   03/14/16 1100 03/14/16 1122  BP: (!) 145/82 (!) 161/87  Pulse: 82 85  Resp: 15 15  Temp: 36.4 C 36.3 C    Last Pain:  Vitals:   03/14/16 1128  TempSrc:   PainSc: Asleep                 Joell Usman,E. Nikiesha Milford

## 2016-03-14 NOTE — Anesthesia Preprocedure Evaluation (Addendum)
Anesthesia Evaluation  Patient identified by MRN, date of birth, ID band Patient awake    Reviewed: Allergy & Precautions, NPO status , Patient's Chart, lab work & pertinent test results  History of Anesthesia Complications Negative for: history of anesthetic complications  Airway Mallampati: I  TM Distance: >3 FB Neck ROM: Full    Dental  (+) Dental Advisory Given   Pulmonary COPD (rarely uses inhaler), former smoker,    breath sounds clear to auscultation       Cardiovascular hypertension, Pt. on medications (-) angina Rhythm:Regular Rate:Normal     Neuro/Psych Anxiety negative neurological ROS     GI/Hepatic Neg liver ROS, GERD  Controlled and Medicated,  Endo/Other  diabetes, Insulin DependentMorbid obesity  Renal/GU Renal InsufficiencyRenal disease (creat 1.29)     Musculoskeletal   Abdominal (+) + obese,   Peds  Hematology  (+) anemia ,   Anesthesia Other Findings   Reproductive/Obstetrics                            Anesthesia Physical Anesthesia Plan  ASA: III  Anesthesia Plan: General   Post-op Pain Management:    Induction: Intravenous  Airway Management Planned: Oral ETT  Additional Equipment:   Intra-op Plan:   Post-operative Plan: Extubation in OR  Informed Consent: I have reviewed the patients History and Physical, chart, labs and discussed the procedure including the risks, benefits and alternatives for the proposed anesthesia with the patient or authorized representative who has indicated his/her understanding and acceptance.   Dental advisory given  Plan Discussed with: CRNA and Surgeon  Anesthesia Plan Comments: (Plan routine monitors, GETA)        Anesthesia Quick Evaluation

## 2016-03-14 NOTE — Progress Notes (Signed)
Inpatient Diabetes Program Recommendations  AACE/ADA: New Consensus Statement on Inpatient Glycemic Control (2015)  Target Ranges:  Prepandial:   less than 140 mg/dL      Peak postprandial:   less than 180 mg/dL (1-2 hours)      Critically ill patients:  140 - 180 mg/dL   Lab Results  Component Value Date   GLUCAP 179 (H) 03/14/2016   HGBA1C 6.8 (H) 08/03/2014    Review of Glycemic Control  Spoke briefly with pt regarding her insulin pump. Explained that Lantus 10 units QHS and Novolog 0-20 units Q4H have been ordered d/t changing insulin needs post surgery. Pt's family said pt did not want to put insulin pump back on at present. Agree with orders.  Gave above info to [employer]. Will follow. Thank you. Ailene Ardshonda Anahis Furgeson, RD, LDN, CDE Inpatient Diabetes Coordinator [phone]

## 2016-03-14 NOTE — Anesthesia Procedure Notes (Signed)
Procedure Name: Intubation Date/Time: 03/14/2016 7:35 AM Performed by: Jhonnie GarnerMARSHALL, Elvera Almario M Pre-anesthesia Checklist: Patient identified, Emergency Drugs available, Suction available and Patient being monitored Patient Re-evaluated:Patient Re-evaluated prior to inductionOxygen Delivery Method: Circle system utilized Preoxygenation: Pre-oxygenation with 100% oxygen Intubation Type: IV induction Ventilation: Mask ventilation without difficulty Laryngoscope Size: Mac and 3 Grade View: Grade I Tube size: 7.5 mm Airway Equipment and Method: Stylet Placement Confirmation: ETT inserted through vocal cords under direct vision,  positive ETCO2 and breath sounds checked- equal and bilateral Secured at: 21 cm Tube secured with: Tape Dental Injury: Teeth and Oropharynx as per pre-operative assessment

## 2016-03-14 NOTE — H&P (View-Only) (Signed)
Chief Complaint:  Obesity and GERD  History of Present Illness:  Karen Gardner is an 60 y.o. female who presents for sleeve gastrectomy.  Associated symptoms include heartburn (takes prevacid daily). Surgical history: ventral hernia repair (as a child). Her obesity dates back to childhood and her weight ballooned up to over 300 lbs. She is currently on an insuling pump with a weight of 232 (BMI 38). She has had her right ankle and then her knee repaired. She has GERD and upper GI shows hiatal hernia. Denies DVT  I explained both roux en Y and sleeve gastrectomy to her in detail. I told her that I would recommend roux en Y as it potentially would help her DM better than the sleeve but there is a group of DM patients for whom they don't respond. She would like to pursue sleeve gastrectomy and she is aware of the risk of leaking sleeve. She denies snoring or problems with OSA.   She wants a sleeve gastectomy and I have discussed this with her and explained and answered her questions.     Past Medical History:  Diagnosis Date  . Anemia    has had to have iron infusions-sees dr enever  . Anxiety   . Arthritis   . CKD (chronic kidney disease)   . Diabetes mellitus without complication (HCC)    Type 2 on insulin pump  . GERD (gastroesophageal reflux disease)   . Hypertension   . Iron deficiency anemia   . Iron deficiency anemia 05/26/2015  . 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 Numbness in both hands    mostly at night  . Shortness of breath dyspnea    with exertion    Past Surgical History:  Procedure Laterality Date  . COLONOSCOPY    . EYE SURGERY Bilateral    [place]   . HERNIA REPAIR     umb hernia as child  . KNEE ARTHROSCOPY  02/12/2012   Procedure: ARTHROSCOPY KNEE;  Surgeon: Frank J Rowan, MD;  Location: [facility];  Service: Orthopedics;  Laterality: Right;  Partial Lateral Meniscectomy, Debridement chondromalacia  . ORIF ANKLE FRACTURE Right 02/11/2014   Procedure: OPEN REDUCTION INTERNAL FIXATION (ORIF) RIGHT ANKLE FRACTURE;  Surgeon: Frank J Rowan, MD;  Location: [facility] OR;  Service: Orthopedics;  Laterality: Right;  . TOTAL KNEE ARTHROPLASTY Right 08/05/2014  . TOTAL KNEE ARTHROPLASTY Right 08/05/2014   Procedure: TOTAL KNEE ARTHROPLASTY;  Surgeon: Frank Rowan, MD;  Location: [facility] OR;  Service: Orthopedics;  Laterality: Right;  . UPPER GI ENDOSCOPY      Current Outpatient Prescriptions  Medication Sig Dispense Refill  . albuterol (PROVENTIL HFA;VENTOLIN HFA) 108 (90 Base) MCG/ACT inhaler Inhale 2 puffs into the lungs every 6 (six) hours as needed for wheezing or shortness of breath.    . aspirin EC 325 MG tablet Take 1 tablet (325 mg total) by mouth 2 (two) times daily. (Patient not taking: Reported on 09/07/2015) 30 tablet 0  . aspirin EC 81 MG tablet Take 81 mg by mouth daily.    . atorvastatin (LIPITOR) 40 MG tablet Take 40 mg by mouth daily at 6 PM.    . CALCIUM PO Take 1 capsule by mouth at bedtime.    . COCONUT OIL PO Take 1 tablet by mouth daily.    . diphenhydrAMINE (BENADRYL) 25 MG tablet Take 25 mg by mouth every 6 (six) hours as needed for allergies.    . escitalopram (LEXAPRO) 10 MG tablet Take 10 mg by mouth   daily.    . glucose blood (ONETOUCH VERIO) test strip Use as instructed to check blood sugar once a day dx code E11.65 50 each 2  . Insulin Disposable Pump (V-GO 20) KIT Use one per day (Patient taking differently: Use one per day - Humalog) 1 kit 2  . insulin lispro (HUMALOG) 100 UNIT/ML injection 2-4 Units by Pump Prime route 3 (three) times daily with meals. Takes 3 units in the morning, 2 units at lunch, 4 units at supper    . JANUVIA 50 MG tablet Take 50 mg by mouth daily.  1  . lansoprazole (PREVACID) 30 MG capsule Take 30 mg by mouth daily.    . methocarbamol (ROBAXIN) 500 MG tablet Take 500 mg by mouth 2 (two) times daily as needed for muscle spasms.     Glory Rosebush DELICA LANCETS FINE MISC Use to check blood sugar once a day  dx code E11.65 100 each 3  . phentermine (ADIPEX-P) 37.5 MG tablet Take 37.5 mg by mouth daily.    Marland Kitchen POTASSIUM PO Take 2 tablets by mouth daily.    . valsartan-hydrochlorothiazide (DIOVAN-HCT) 80-12.5 MG per tablet Take 1 tablet by mouth daily.     No current facility-administered medications for this visit.    Lactose intolerance (gi) No family history on file. Social History:   reports that she quit smoking about 44 years ago. She has never used smokeless tobacco. She reports that she does not drink alcohol or use drugs.   REVIEW OF SYSTEMS : Negative except for see problem list  Physical Exam:   There were no vitals taken for this visit. There is no height or weight on file to calculate BMI.  Gen:  WDWN AAF NAD  Neurological: Alert and oriented to person, place, and time. Motor and sensory function is grossly intact  Head: Normocephalic and atraumatic.  Eyes: Conjunctivae are normal. Pupils are equal, round, and reactive to light. No scleral icterus.  Neck: Normal range of motion. Neck supple. No tracheal deviation or thyromegaly present.  Cardiovascular:  SR without murmurs or gallops.  No carotid bruits Breast:  Not examined Respiratory: Effort normal.  No respiratory distress. No chest wall tenderness. Breath sounds normal.  No wheezes, rales or rhonchi.  Abdomen:  nontender GU:  Not examined Musculoskeletal: Normal range of motion. Extremities are nontender. No cyanosis, edema or clubbing noted Lymphadenopathy: No cervical, preauricular, postauricular or axillary adenopathy is present Skin: Skin is warm and dry. No rash noted. No diaphoresis. No erythema. No pallor. Pscyh: Normal mood and affect. Behavior is normal. Judgment and thought content normal.   LABORATORY RESULTS: No results found for this or any previous visit (from the past 48 hour(s)).   RADIOLOGY RESULTS: No results found.  Problem List: Patient Active Problem List   Diagnosis Date Noted  . Iron  deficiency anemia 05/26/2015  . 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 Primary osteoarthritis of right knee 08/05/2014  . Primary osteoarthritis of knee 08/05/2014  . Diabetes mellitus type 2, uncontrolled ([place]) 02/12/2014  . Essential hypertension 02/12/2014  . Ankle fracture, bimalleolar, closed 02/11/2014  . Essential hypertension, benign 01/28/2014  . Type II diabetes mellitus, uncontrolled ([place]) 01/21/2014    Assessment & Plan: GERD with HH and morbid obesity.  Plan repair of hiatal hernia and sleeve gastrectomy    Matt B. Hassell Done, MD, [facility] [phone] beeper [phone]  03/01/2016 2:59 PM

## 2016-03-14 NOTE — Interval H&P Note (Signed)
History and Physical Interval Note:  03/14/2016 7:24 AM  Karen Gardner  has presented today for surgery, with the diagnosis of Morbid Obesity, DM, HTN, GERD  The various methods of treatment have been discussed with the patient and family. After consideration of risks, benefits and other options for treatment, the patient has consented to  Procedure(s): LAPAROSCOPIC GASTRIC SLEEVE RESECTION, UPPER ENDO (N/A) as a surgical intervention .  The patient's history has been reviewed, patient examined, no change in status, stable for surgery.  I have reviewed the patient's chart and labs.  Questions were answered to the patient's satisfaction.     Jael Waldorf B

## 2016-03-15 LAB — CBC WITH DIFFERENTIAL/PLATELET
Basophils Absolute: 0 10*3/uL (ref 0.0–0.1)
Basophils Relative: 0 %
Eosinophils Absolute: 0 10*3/uL (ref 0.0–0.7)
Eosinophils Relative: 0 %
HEMATOCRIT: 35.6 % — AB (ref 36.0–46.0)
Hemoglobin: 11.5 g/dL — ABNORMAL LOW (ref 12.0–15.0)
LYMPHS ABS: 0.9 10*3/uL (ref 0.7–4.0)
LYMPHS PCT: 13 %
MCH: 28.9 pg (ref 26.0–34.0)
MCHC: 32.3 g/dL (ref 30.0–36.0)
MCV: 89.4 fL (ref 78.0–100.0)
MONO ABS: 1.2 10*3/uL — AB (ref 0.1–1.0)
Monocytes Relative: 16 %
NEUTROS ABS: 5.1 10*3/uL (ref 1.7–7.7)
Neutrophils Relative %: 71 %
Platelets: 212 10*3/uL (ref 150–400)
RBC: 3.98 MIL/uL (ref 3.87–5.11)
RDW: 13.5 % (ref 11.5–15.5)
WBC: 7.2 10*3/uL (ref 4.0–10.5)

## 2016-03-15 LAB — GLUCOSE, CAPILLARY
GLUCOSE-CAPILLARY: 163 mg/dL — AB (ref 65–99)
GLUCOSE-CAPILLARY: 291 mg/dL — AB (ref 65–99)
Glucose-Capillary: 193 mg/dL — ABNORMAL HIGH (ref 65–99)
Glucose-Capillary: 210 mg/dL — ABNORMAL HIGH (ref 65–99)
Glucose-Capillary: 233 mg/dL — ABNORMAL HIGH (ref 65–99)
Glucose-Capillary: 263 mg/dL — ABNORMAL HIGH (ref 65–99)
Glucose-Capillary: 68 mg/dL (ref 65–99)

## 2016-03-15 MED ORDER — IRBESARTAN 150 MG PO TABS
75.0000 mg | ORAL_TABLET | Freq: Every day | ORAL | Status: DC
  Administered 2016-03-15 – 2016-03-16 (×2): 75 mg via ORAL
  Filled 2016-03-15 (×2): qty 1

## 2016-03-15 MED ORDER — ALUM & MAG HYDROXIDE-SIMETH 200-200-20 MG/5ML PO SUSP
30.0000 mL | ORAL | Status: DC | PRN
Start: 2016-03-15 — End: 2016-03-16
  Administered 2016-03-15 – 2016-03-16 (×2): 30 mL via ORAL
  Filled 2016-03-15 (×2): qty 30

## 2016-03-15 MED ORDER — HYDROCHLOROTHIAZIDE 12.5 MG PO CAPS
12.5000 mg | ORAL_CAPSULE | Freq: Every day | ORAL | Status: DC
  Administered 2016-03-15 – 2016-03-16 (×2): 12.5 mg via ORAL
  Filled 2016-03-15 (×2): qty 1

## 2016-03-15 MED ORDER — HYDRALAZINE HCL 20 MG/ML IJ SOLN
10.0000 mg | Freq: Four times a day (QID) | INTRAMUSCULAR | Status: DC | PRN
  Administered 2016-03-15: 10 mg via INTRAVENOUS
  Filled 2016-03-15: qty 1

## 2016-03-15 MED ORDER — INSULIN PUMP
Freq: Three times a day (TID) | SUBCUTANEOUS | Status: DC
  Filled 2016-03-15: qty 1

## 2016-03-15 NOTE — Progress Notes (Addendum)
Inpatient Diabetes Program Recommendations  AACE/ADA: New Consensus Statement on Inpatient Glycemic Control (2015)  Target Ranges:  Prepandial:   less than 140 mg/dL      Peak postprandial:   less than 180 mg/dL (1-2 hours)      Critically ill patients:  140 - 180 mg/dL   Lab Results  Component Value Date   GLUCAP 163 (H) 03/15/2016   HGBA1C 6.8 (H) 08/03/2014   Results for Karen Gardner, Karen D (MRN [id number]) as of 03/15/2016 10:19  Ref. Range 03/14/2016 09:58 03/14/2016 12:00 03/14/2016 16:04 03/14/2016 20:20 03/15/2016 00:09 03/15/2016 04:18 03/15/2016 07:52  Glucose-Capillary Latest Ref Range: 65 - 99 mg/dL 409165 (H) 811179 (H) 914252 (H) 301 (H) 233 (H) 210 (H) 163 (H)   Review of Glycemic Control  Diabetes history: DM2, Obesity, steroids this admission 12/12 one time), BUN=41, Creatinine=1.29 Outpatient Diabetes medications: V-GO 20 - patient removed prior to surgery, Novolog 20 units after meals TID, Januvia 50 mg daily Current orders for Inpatient glycemic control:  Novolog 0-20 Q4H, Lantus 10 units QHS   Inpatient Diabetes Program Recommendations:      Will continue to watch, patient trending down after decadron on 12/12. Note: Spoke with patient at bedside.  Patient stated that Endocrine MD (Dr. Sharl MaKerr) did not give insulin pump settings following surgery because MD did not think that pump would be necessary.  Patient was thinking she would need pump, though.  This RN discussed decreased insulin needs following bariatric surgery, signs/symptoms of hypoglycemia and hyperglycemia, how to switch insulin pump sites, when to discontinue pump (if patient is to be discharged with pump), and when to call MD.  Thank you,  Kristine LineaKaren Maziyah Vessel, RN, BSN Diabetes Coordinator Inpatient Diabetes Program [phone] (Team Pager)

## 2016-03-15 NOTE — Progress Notes (Signed)
Patient ID: Karen Gardner, female   DOB: 04-14-1955, 60 y.o.   MRN: [id number] [facility] Progress Note:   1 Day Post-Op  Subjective: Mental status is clear. No complaints.   Objective: Vital signs in last 24 hours: Temp:  [97.3 F (36.3 C)-99.1 F (37.3 C)] 99.1 F (37.3 C) (12/13 1000) Pulse Rate:  [82-98] 88 (12/13 1000) Resp:  [14-16] 16 (12/13 1000) BP: (140-187)/(63-92) 176/92 (12/13 1000) SpO2:  [95 %-100 %] 97 % (12/13 1000)  Intake/Output from previous day: 12/12 0701 - 12/13 0700 In: 2420 [P.O.:120; I.V.:2300] Out: 1390 [Urine:1365; Blood:25] Intake/Output this shift: Total I/O In: 800 [I.V.:800] Out: 580 [Urine:580]  Physical Exam: Work of breathing is normal.  Incisions ok  Lab Results:  Results for orders placed or performed during the hospital encounter of 03/14/16 (from the past 48 hour(s))  Glucose, capillary     Status: None   Collection Time: 03/14/16  5:18 AM  Result Value Ref Range   Glucose-Capillary 79 65 - 99 mg/dL   Comment 1 Notify RN   Glucose, capillary     Status: None   Collection Time: 03/14/16  7:47 AM  Result Value Ref Range   Glucose-Capillary 75 65 - 99 mg/dL   Comment 1 Document in Chart   Glucose, capillary     Status: Abnormal   Collection Time: 03/14/16  8:51 AM  Result Value Ref Range   Glucose-Capillary 138 (H) 65 - 99 mg/dL  Glucose, capillary     Status: Abnormal   Collection Time: 03/14/16  9:58 AM  Result Value Ref Range   Glucose-Capillary 165 (H) 65 - 99 mg/dL  CBC     Status: Abnormal   Collection Time: 03/14/16 11:58 AM  Result Value Ref Range   WBC 9.3 4.0 - 10.5 K/uL   RBC 4.06 3.87 - 5.11 MIL/uL   Hemoglobin 11.7 (L) 12.0 - 15.0 g/dL   HCT 36.2 36.0 - 46.0 %   MCV 89.2 78.0 - 100.0 fL   MCH 28.8 26.0 - 34.0 pg   MCHC 32.3 30.0 - 36.0 g/dL   RDW 13.3 11.5 - 15.5 %   Platelets 170 150 - 400 K/uL  Creatinine, serum     Status: Abnormal   Collection Time: 03/14/16 11:58 AM  Result Value Ref Range    Creatinine, Ser 1.38 (H) 0.44 - 1.00 mg/dL   GFR calc non Af Amer 40 (L) >60 mL/min   GFR calc Af Amer 46 (L) >60 mL/min    Comment: (NOTE) The eGFR has been calculated using the CKD EPI equation. This calculation has not been validated in all clinical situations. eGFR's persistently <60 mL/min signify possible Chronic Kidney Disease.   Glucose, capillary     Status: Abnormal   Collection Time: 03/14/16 12:00 PM  Result Value Ref Range   Glucose-Capillary 179 (H) 65 - 99 mg/dL  Glucose, capillary     Status: Abnormal   Collection Time: 03/14/16  4:04 PM  Result Value Ref Range   Glucose-Capillary 252 (H) 65 - 99 mg/dL  Glucose, capillary     Status: Abnormal   Collection Time: 03/14/16  8:20 PM  Result Value Ref Range   Glucose-Capillary 301 (H) 65 - 99 mg/dL  Glucose, capillary     Status: Abnormal   Collection Time: 03/15/16 12:09 AM  Result Value Ref Range   Glucose-Capillary 233 (H) 65 - 99 mg/dL  Glucose, capillary     Status: Abnormal   Collection Time: 03/15/16  4:18 AM  Result Value Ref Range   Glucose-Capillary 210 (H) 65 - 99 mg/dL  CBC WITH DIFFERENTIAL     Status: Abnormal   Collection Time: 03/15/16  4:46 AM  Result Value Ref Range   WBC 7.2 4.0 - 10.5 K/uL   RBC 3.98 3.87 - 5.11 MIL/uL   Hemoglobin 11.5 (L) 12.0 - 15.0 g/dL   HCT 35.6 (L) 36.0 - 46.0 %   MCV 89.4 78.0 - 100.0 fL   MCH 28.9 26.0 - 34.0 pg   MCHC 32.3 30.0 - 36.0 g/dL   RDW 13.5 11.5 - 15.5 %   Platelets 212 150 - 400 K/uL   Neutrophils Relative % 71 %   Neutro Abs 5.1 1.7 - 7.7 K/uL   Lymphocytes Relative 13 %   Lymphs Abs 0.9 0.7 - 4.0 K/uL   Monocytes Relative 16 %   Monocytes Absolute 1.2 (H) 0.1 - 1.0 K/uL   Eosinophils Relative 0 %   Eosinophils Absolute 0.0 0.0 - 0.7 K/uL   Basophils Relative 0 %   Basophils Absolute 0.0 0.0 - 0.1 K/uL  Glucose, capillary     Status: Abnormal   Collection Time: 03/15/16  7:52 AM  Result Value Ref Range   Glucose-Capillary 163 (H) 65 - 99  mg/dL    Radiology/Results: No results found.  Anti-infectives: Anti-infectives    Start     Dose/Rate Route Frequency Ordered Stop   03/14/16 0530  cefoTEtan in Dextrose 5% (CEFOTAN) IVPB 2 g     2 g Intravenous On call to O.R. 03/14/16 0530 03/14/16 0745      Assessment/Plan: Problem List: Patient Active Problem List   Diagnosis Date Noted  . S/P laparoscopic sleeve gastrectomy Dec 2017 03/14/2016  . Iron deficiency anemia 05/26/2015  . 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 Primary osteoarthritis of right knee 08/05/2014  . Primary osteoarthritis of knee 08/05/2014  . Diabetes mellitus type 2, uncontrolled ([place]) 02/12/2014  . Essential hypertension 02/12/2014  . Ankle fracture, bimalleolar, closed 02/11/2014  . Essential hypertension, benign 01/28/2014  . Type II diabetes mellitus, uncontrolled ([place]) 01/21/2014    Will restart insulin pump therapy.  Possible discharge later today for tomorrow.   1 Day Post-Op    LOS: 1 day   Matt B. Hassell Done, MD, [facility] [phone] beeper [phone]  03/15/2016 10:45 AM

## 2016-03-15 NOTE — Progress Notes (Signed)
Patient with order to restart home insulin pump.  Pharmacy Alisia Ferrarirew Woffard asked nurse not to let patient restart pump until information obtained from Diabetes Coordinator.  Patient does have pump insulin, insulin, and needed supplies. She is alert and oriented and able to care for pump. Await further instruction from pharmacy.  Patient made aware of the above information regarding pump.  Pump contract provided to patient.

## 2016-03-15 NOTE — Progress Notes (Signed)
Pt is alert and oriented, ambulating in hallways.  Pt has tolerated 12 oz of water and 11 oz of protein shake.  Patients pain is controlled with oral pain medication.  Will continue to monitor.

## 2016-03-15 NOTE — Plan of Care (Signed)
Problem: Food- and Nutrition-Related Knowledge Deficit (NB-1.1) Goal: Nutrition education Formal process to instruct or train a patient/client in a skill or to impart knowledge to help patients/clients voluntarily manage or modify food choices and eating behavior to maintain or improve health. Outcome: Completed/Met Date Met: 03/15/16 Nutrition Education Note  Received consult for diet education per DROP protocol.   Discussed 2 week post op diet with pt. Emphasized that liquids must be non carbonated, non caffeinated, and sugar free. Fluid goals discussed. Reviewed progression of diet to include soft proteins at 7-10 days post-op. Pt to follow up with outpatient bariatric RD for further diet progression after 2 weeks. Multivitamins and minerals also reviewed. Teach back method used, pt expressed understanding, expect good compliance.   Diet: First 2 Weeks  You will see the dietitian about two (2) weeks after your surgery. The dietitian will increase the types of foods you can eat if you are handling liquids well:  If you have severe vomiting or nausea and cannot handle clear liquids lasting longer than 1 day, call your surgeon  Protein Shake  Drink at least 2 ounces of shake 5-6 times per day  Each serving of protein shakes (usually 8 - 12 ounces) should have a minimum of:  15 grams of protein  And no more than 5 grams of carbohydrate  Goal for protein each day:  Men = 80 grams per day  Women = 60 grams per day  Protein powder may be added to fluids such as non-fat milk or Lactaid milk or Soy milk (limit to 35 grams added protein powder per serving)   Hydration  Slowly increase the amount of water and other clear liquids as tolerated (See Acceptable Fluids)  Slowly increase the amount of protein shake as tolerated  Sip fluids slowly and throughout the day  May use sugar substitutes in small amounts (no more than 6 - 8 packets per day; i.e. Splenda)   Fluid Goal  The first goal is to  drink at least 8 ounces of protein shake/drink per day (or as directed by the nutritionist); some examples of protein shakes are [employer], [employer], EAS Edge HP, and Unjury. See handout from pre-op Bariatric Education Class:  Slowly increase the amount of protein shake you drink as tolerated  You may find it easier to slowly sip shakes throughout the day  It is important to get your proteins in first  Your fluid goal is to drink 64 - 100 ounces of fluid daily  It may take a few weeks to build up to this  32 oz (or more) should be clear liquids  And  32 oz (or more) should be full liquids (see below for examples)  Liquids should not contain sugar, caffeine, or carbonation   Clear Liquids:  Water or Sugar-free flavored water (i.e. Fruit H2O, Propel)  Decaffeinated coffee or tea (sugar-free)  Crystal Lite, Wyler's Lite, Minute Maid Lite  Sugar-free Jell-O  Bouillon or broth  Sugar-free Popsicle: *Less than 20 calories each; Limit 1 per day   Full Liquids:  Protein Shakes/Drinks + 2 choices per day of other full liquids  Full liquids must be:  No More Than 12 grams of Carbs per serving  No More Than 3 grams of Fat per serving  Strained low-fat cream soup  Non-Fat milk  Fat-free Lactaid Milk  Sugar-free yogurt (Dannon Lite & Fit, Greek yogurt)     Clayton Bibles, MS, RD, LDN Pager: [phone] After Hours Pager: [phone]

## 2016-03-15 NOTE — Progress Notes (Signed)
Patient not to start insulin pump per diabetes coordinator. Patient made aware as was pharmacy.  MD aware per coordinator

## 2016-03-15 NOTE — Progress Notes (Addendum)
Spoke with patient again at bedside to discuss CBG trends over last 24 hours.  Patient requiring only half the amount of correction and basal insulin dosages.  V Go pumps do not have the basal dosage patient currently requiring.  Patient understands that Dr. Daphine DeutscherMartin will continue to watch trends and that she may be discharged on SQ insulin and patient agrees with this plan of care. If patient is discharged on SQ insulin, insurance will cover insulin pens.  Order number  for pen needles is [phone].  Lantus Solostar and Novolog Flexpen are covered by [employer]'s insurance.  Thank you,  Kristine LineaKaren Ailen Strauch, RN, BSN Diabetes Coordinator Inpatient Diabetes Program [phone] (Team Pager)

## 2016-03-15 NOTE — Progress Notes (Signed)
Paged MD for patient requesting something for indigestion. Received verbal orders for 30mL of Mylanta every 4 hours as needed.

## 2016-03-16 LAB — CBC WITH DIFFERENTIAL/PLATELET
BASOS ABS: 0 10*3/uL (ref 0.0–0.1)
BASOS PCT: 0 %
EOS ABS: 0 10*3/uL (ref 0.0–0.7)
Eosinophils Relative: 1 %
HEMATOCRIT: 33.2 % — AB (ref 36.0–46.0)
Hemoglobin: 10.8 g/dL — ABNORMAL LOW (ref 12.0–15.0)
Lymphocytes Relative: 19 %
Lymphs Abs: 1.3 10*3/uL (ref 0.7–4.0)
MCH: 28.3 pg (ref 26.0–34.0)
MCHC: 32.5 g/dL (ref 30.0–36.0)
MCV: 86.9 fL (ref 78.0–100.0)
MONO ABS: 1 10*3/uL (ref 0.1–1.0)
MONOS PCT: 14 %
NEUTROS ABS: 4.3 10*3/uL (ref 1.7–7.7)
Neutrophils Relative %: 66 %
PLATELETS: 164 10*3/uL (ref 150–400)
RBC: 3.82 MIL/uL — ABNORMAL LOW (ref 3.87–5.11)
RDW: 13.3 % (ref 11.5–15.5)
WBC: 6.6 10*3/uL (ref 4.0–10.5)

## 2016-03-16 LAB — GLUCOSE, CAPILLARY
GLUCOSE-CAPILLARY: 164 mg/dL — AB (ref 65–99)
GLUCOSE-CAPILLARY: 197 mg/dL — AB (ref 65–99)
GLUCOSE-CAPILLARY: 65 mg/dL (ref 65–99)
Glucose-Capillary: 169 mg/dL — ABNORMAL HIGH (ref 65–99)
Glucose-Capillary: 167 mg/dL — ABNORMAL HIGH (ref 65–99)

## 2016-03-16 MED ORDER — INSULIN GLARGINE 100 UNIT/ML ~~LOC~~ SOLN: 10.0000 [IU] | Freq: Every day | SUBCUTANEOUS | 11 refills | Status: DC

## 2016-03-16 MED ORDER — INSULIN ASPART 100 UNIT/ML ~~LOC~~ SOLN: 0.0000 [IU] | SUBCUTANEOUS | 11 refills | Status: DC

## 2016-03-16 NOTE — Discharge Summary (Signed)
Physician Discharge Summary  Patient ID: Karen Gardner MRN: [id number] DOB/AGE: 1955/07/05 60 y.o.  Admit date: 03/14/2016 Discharge date: 03/16/2016  Admission Diagnoses:  DM and obesity  Discharge Diagnoses:  same  Principal Problem:   S/P laparoscopic sleeve gastrectomy Dec 2017   Surgery:  Sleeve gastrectomy  Discharged Condition: improved  Hospital Course:   Had surgery.  VGO insulin pump removed.  Sliding scale used.  Taking liquids OK  Consults: none  Significant Diagnostic Studies: none    Discharge Exam: Blood pressure 120/86, pulse 97, temperature 98.7 F (37.1 C), temperature source Oral, resp. rate 18, height 5' 5.5" (1.664 m), weight 105.7 kg (233 lb), SpO2 99 %. Incisions OK  Disposition: 01-Home or Self Care  Discharge Instructions    Ambulate hourly while awake    Complete by:  As directed    Call MD for:  difficulty breathing, headache or visual disturbances    Complete by:  As directed    Call MD for:  persistant dizziness or light-headedness    Complete by:  As directed    Call MD for:  persistant nausea and vomiting    Complete by:  As directed    Call MD for:  redness, tenderness, or signs of infection (pain, swelling, redness, odor or green/yellow discharge around incision site)    Complete by:  As directed    Call MD for:  severe uncontrolled pain    Complete by:  As directed    Call MD for:  temperature >101 F    Complete by:  As directed    Diet bariatric full liquid    Complete by:  As directed    Incentive spirometry    Complete by:  As directed    Perform hourly while awake       Medication List    STOP taking these medications   aspirin EC 81 MG tablet   insulin pump Soln   V-GO 20 Kit     TAKE these medications   albuterol 108 (90 Base) MCG/ACT inhaler Commonly known as:  PROVENTIL HFA;VENTOLIN HFA Inhale 2 puffs into the lungs every 6 (six) hours as needed for wheezing or shortness of breath.   atorvastatin 40 MG  tablet Commonly known as:  LIPITOR Take 40 mg by mouth at bedtime.   CALCIUM PO Take 1 capsule by mouth 3 (three) times daily.   diphenhydrAMINE 25 MG tablet Commonly known as:  BENADRYL Take 25 mg by mouth every 6 (six) hours as needed for allergies.   escitalopram 10 MG tablet Commonly known as:  LEXAPRO Take 10 mg by mouth daily.   glucose blood test strip Commonly known as:  ONETOUCH VERIO Use as instructed to check blood sugar once a day dx code E11.65   insulin glargine 100 UNIT/ML injection Commonly known as:  LANTUS Inject 0.1 mLs (10 Units total) into the skin at bedtime.   JANUVIA 50 MG tablet Generic drug:  sitaGLIPtin Take 50 mg by mouth daily. Notes to patient:  Monitor Blood Sugar Frequently and keep a log for primary care physician, you may need to adjust medication dosage with rapid weight loss.     lansoprazole 15 MG capsule Commonly known as:  PREVACID Take 15 mg by mouth daily.   methocarbamol 500 MG tablet Commonly known as:  ROBAXIN Take 500 mg by mouth at bedtime as needed for muscle spasms.   multivitamin with minerals Tabs tablet Take 1 tablet by mouth 2 (two) times daily.  NOVOLOG 100 UNIT/ML injection Generic drug:  insulin aspart Inject 20 Units into the skin daily. Boluses after meals What changed:  Another medication with the same name was added. Make sure you understand how and when to take each.   insulin aspart 100 UNIT/ML injection Commonly known as:  novoLOG Inject 0-20 Units into the skin every 4 (four) hours. What changed:  You were already taking a medication with the same name, and this prescription was added. Make sure you understand how and when to take each.   ONETOUCH DELICA LANCETS FINE Misc Use to check blood sugar once a day dx code E11.65   valsartan-hydrochlorothiazide 80-12.5 MG tablet Commonly known as:  DIOVAN-HCT Take 1 tablet by mouth daily. Notes to patient:  Monitor Blood Pressure Daily and keep a log for  primary care physician.  Monitor for symptoms of dehydration.  You may need to make changes to your medications with rapid weight loss.        Follow-up Information    Pedro Earls, MD Follow up on 03/30/2016.   Specialty:  General Surgery Why:  follow up appointment @ 1:30pm  Contact information: [address] [place] [address] [phone]        Pedro Earls, MD Follow up.   Specialty:  General Surgery Contact information: [address] [address] [phone]           Signed: Pedro Earls 03/16/2016, 11:19 AM

## 2016-03-16 NOTE — Progress Notes (Signed)
Patient alert and oriented, pain is controlled. Patient is tolerating fluids, advanced to protein shake today, patient is tolerating well.  Reviewed Gastric sleeve discharge instructions with patient and patient is able to articulate understanding.  Provided information on BELT program, Support Group and [facility] outpatient pharmacy. All questions answered, will continue to monitor.  

## 2016-03-16 NOTE — Progress Notes (Signed)
Pt was discharged home today. Instructions were reviewed with patient, and questions were answered. Pt was taken to main entrance via wheelchair by NT.  

## 2016-03-16 NOTE — Progress Notes (Signed)
Inpatient Diabetes Program Recommendations  AACE/ADA: New Consensus Statement on Inpatient Glycemic Control (2015)  Target Ranges:  Prepandial:   less than 140 mg/dL      Peak postprandial:   less than 180 mg/dL (1-2 hours)      Critically ill patients:  140 - 180 mg/dL   Lab Results  Component Value Date   GLUCAP 167 (H) 03/16/2016   HGBA1C 6.8 (H) 08/03/2014    Review of Glycemic Control  Spoke with pt regarding her insulin needs when discharged. Instructed pt to call Dr. Daune PerchKerr's office and ask about what he wants her to be on as a replacement for the V-go pump. Post-op, pt has been on Lantus 10 units QHS and Novolog resistant Q4H. Had hypoglycemia last night around [place]. Discussed hypoglycemia awareness and treatment. Encouraged pt to check blood sugars more frequently to assess continued insulin adjustments.  Pt called endo this am prior to discharge and requested recommendations on insulin since V-go pump will no longer be needed. Endo's office to call pt back today with recs.  Discussed with RN.  Thank you. Karen Gardner, RD, LDN, CDE Inpatient Diabetes Coordinator [phone]

## 2016-03-16 NOTE — Progress Notes (Signed)
Patient called Dr. Sharl MaKerr and requested that he give her a sliding scale for her Novolog and bedtime dosage for Lantus. I personally wrote down every blood sugar she had taken after her surgery so that she could inform Dr. Sharl MaKerr that 10 units of Lantus dropped her sugars into the 60's. Patient also asked for syringes to be called in as well by Dr. Sharl MaKerr. The patient had to leave a message for the Dr. to call her back and she felt comfortable enough to go home and wait for his reply to her message. Dhaval Woo R McClean

## 2016-03-28 ENCOUNTER — Encounter: Payer: Self-pay | Admitting: Skilled Nursing Facility1

## 2016-03-28 ENCOUNTER — Encounter: Payer: BLUE CROSS/BLUE SHIELD | Attending: Surgery | Admitting: Skilled Nursing Facility1

## 2016-03-28 DIAGNOSIS — Z713 Dietary counseling and surveillance: Secondary | ICD-10-CM | POA: Insufficient documentation

## 2016-03-28 DIAGNOSIS — E119 Type 2 diabetes mellitus without complications: Secondary | ICD-10-CM | POA: Insufficient documentation

## 2016-03-28 DIAGNOSIS — E1165 Type 2 diabetes mellitus with hyperglycemia: Secondary | ICD-10-CM

## 2016-03-28 DIAGNOSIS — IMO0001 Reserved for inherently not codable concepts without codable children: Secondary | ICD-10-CM

## 2016-03-28 NOTE — Progress Notes (Signed)
Bariatric Class:  Appt start time: 1530 end time:  1630.  2 Week Post-Operative Nutrition Class  Patient was seen on 03/28/2016 for Post-[facility]ative Nutrition [facility].   Surgery date: 03/14/2016 Surgery type: sleeve gastrectomy [facility] weight at [facility]: 236 lbs on 01/24/2016 Weight today: 225.2lbs Wt change: (pre-op to today) 10.8 pounds  TANITA  BODY COMP RESULTS  02/28/16 03/28/2016   BMI (kg/m^2) 38.8 36.9   Fat Mass (lbs) 114.8 111.6   Fat Free Mass (lbs) 121.8 113.6   Total Body Water (lbs) 88.2 82    The following the learning objectives were met by the patient during this course:  Identifies Phase 3A (Soft, High Proteins) Dietary Goals and will begin from 2 weeks post-operatively to 2 months post-operatively  Identifies appropriate sources of fluids and proteins   States protein recommendations and appropriate sources post-operatively  Identifies the need for appropriate texture modifications, mastication, and bite sizes when consuming solids  Identifies appropriate multivitamin and calcium sources post-operatively  Describes the need for physical activity post-operatively and will follow MD recommendations  States when to call healthcare provider regarding medication questions or post-operative complications  Handouts given during class include:  Phase 3A: Soft, High Protein Diet Handout  Follow-Up Plan: Patient will follow-up at [facility] in 6 weeks for 2 month post-op nutrition visit for diet advancement per MD.

## 2016-04-03 DIAGNOSIS — R55 Syncope and collapse: Secondary | ICD-10-CM

## 2016-04-03 HISTORY — DX: Syncope and collapse: R55

## 2016-04-05 ENCOUNTER — Telehealth (HOSPITAL_COMMUNITY): Payer: Self-pay

## 2016-04-28 ENCOUNTER — Encounter (HOSPITAL_COMMUNITY): Payer: Self-pay | Admitting: Emergency Medicine

## 2016-04-28 ENCOUNTER — Emergency Department (HOSPITAL_COMMUNITY)
Admission: EM | Admit: 2016-04-28 | Discharge: 2016-04-28 | Disposition: A | Discharge status: Home / Self Care | Payer: BLUE CROSS/BLUE SHIELD | Attending: Emergency Medicine | Admitting: Emergency Medicine

## 2016-04-28 DIAGNOSIS — E860 Dehydration: Secondary | ICD-10-CM | POA: Insufficient documentation

## 2016-04-28 DIAGNOSIS — Z87891 Personal history of nicotine dependence: Secondary | ICD-10-CM | POA: Diagnosis not present

## 2016-04-28 DIAGNOSIS — I129 Hypertensive chronic kidney disease with stage 1 through stage 4 chronic kidney disease, or unspecified chronic kidney disease: Secondary | ICD-10-CM | POA: Insufficient documentation

## 2016-04-28 DIAGNOSIS — N189 Chronic kidney disease, unspecified: Secondary | ICD-10-CM | POA: Diagnosis not present

## 2016-04-28 DIAGNOSIS — Z79899 Other long term (current) drug therapy: Secondary | ICD-10-CM | POA: Insufficient documentation

## 2016-04-28 DIAGNOSIS — E1122 Type 2 diabetes mellitus with diabetic chronic kidney disease: Secondary | ICD-10-CM | POA: Insufficient documentation

## 2016-04-28 DIAGNOSIS — R55 Syncope and collapse: Secondary | ICD-10-CM | POA: Diagnosis present

## 2016-04-28 DIAGNOSIS — Z96651 Presence of right artificial knee joint: Secondary | ICD-10-CM | POA: Insufficient documentation

## 2016-04-28 DIAGNOSIS — J45909 Unspecified asthma, uncomplicated: Secondary | ICD-10-CM | POA: Diagnosis not present

## 2016-04-28 DIAGNOSIS — Z794 Long term (current) use of insulin: Secondary | ICD-10-CM | POA: Insufficient documentation

## 2016-04-28 LAB — COMPREHENSIVE METABOLIC PANEL
ALBUMIN: 4 g/dL (ref 3.5–5.0)
ALK PHOS: 57 U/L (ref 38–126)
ALT: 20 U/L (ref 14–54)
AST: 26 U/L (ref 15–41)
Anion gap: 11 (ref 5–15)
BILIRUBIN TOTAL: 1.1 mg/dL (ref 0.3–1.2)
BUN: 23 mg/dL — AB (ref 6–20)
CO2: 26 mmol/L (ref 22–32)
CREATININE: 1.07 mg/dL — AB (ref 0.44–1.00)
Calcium: 9.2 mg/dL (ref 8.9–10.3)
Chloride: 103 mmol/L (ref 101–111)
GFR calc Af Amer: >60 mL/min (ref >60)
GFR calc non Af Amer: 54 mL/min — ABNORMAL LOW (ref >60)
GLUCOSE: 203 mg/dL — AB (ref 65–99)
POTASSIUM: 3.6 mmol/L (ref 3.5–5.1)
Sodium: 140 mmol/L (ref 135–145)
TOTAL PROTEIN: 7.4 g/dL (ref 6.5–8.1)

## 2016-04-28 LAB — URINALYSIS, ROUTINE W REFLEX MICROSCOPIC
Bacteria, UA: NONE SEEN
Bilirubin Urine: NEGATIVE
Glucose, UA: 150 mg/dL — AB
Hgb urine dipstick: NEGATIVE
KETONES UR: 80 mg/dL — AB
NITRITE: NEGATIVE
Protein, ur: 30 mg/dL — AB
SPECIFIC GRAVITY, URINE: 1.023 (ref 1.005–1.030)
pH: 5 (ref 5.0–8.0)

## 2016-04-28 LAB — CBC
HCT: 38.4 % (ref 36.0–46.0)
Hemoglobin: 12.5 g/dL (ref 12.0–15.0)
MCH: 28.3 pg (ref 26.0–34.0)
MCHC: 32.6 g/dL (ref 30.0–36.0)
MCV: 86.9 fL (ref 78.0–100.0)
PLATELETS: 175 10*3/uL (ref 150–400)
RBC: 4.42 MIL/uL (ref 3.87–5.11)
RDW: 13.1 % (ref 11.5–15.5)
WBC: 5.2 10*3/uL (ref 4.0–10.5)

## 2016-04-28 MED ORDER — SODIUM CHLORIDE 0.9 % IV BOLUS (SEPSIS)
1000.0000 mL | Freq: Once | INTRAVENOUS | Status: AC
  Administered 2016-04-28: 1000 mL via INTRAVENOUS

## 2016-04-28 NOTE — ED Notes (Signed)
PT GIVEN CRACKERS AND WATER.

## 2016-04-28 NOTE — ED Triage Notes (Signed)
Patient states that after she got back from walking her dog, she took few sips of water then didn't feel right and went to bathroom and started "regurgatating". Patient states the next thing she knew she woke up on the floor.  Patient reports that on Wed she felt weak, like she was going to pass out and Regurgitating. Patient denies any anticoagulants.

## 2016-04-28 NOTE — ED Notes (Signed)
PT DISCHARGED. INSTRUCTIONS GIVEN. AAOX4. PT IN NO APPARENT DISTRESS OR PAIN. THE OPPORTUNITY TO ASK QUESTIONS WAS PROVIDED. 

## 2016-04-28 NOTE — ED Notes (Signed)
Bed: WA19 Expected date:  Expected time:  Means of arrival:  Comments: 

## 2016-04-28 NOTE — ED Provider Notes (Signed)
[facility]-EMERGENCY DEPT Provider Note   CSN: [id number] Arrival date & time: 04/28/16  1040     History   Chief Complaint Chief Complaint  Patient presents with  . Emesis  . Loss of Consciousness    HPI Karen Gardner is a 61 y.o. female.  The history is provided by the patient. No language interpreter was used.  Emesis    Loss of Consciousness   Associated symptoms include vomiting.    Karen Gardner is a 61 y.o. female who presents to the Emergency Department complaining of syncope. She presents for evaluation of syncope that occurred today. She has a history of recent bariatric surgery with gastric sleeve 6 weeks ago. She was doing well after surgery but had an episode of dizziness when she was walking her dog. When she went home she sats down on the toilet and woke up on the floor. Following waking she had nausea and vomiting. She denies any headache, chest pain, abdominal pain, diarrhea. 2 days ago she also had an episode of lightheadedness but was able to sit down before she passed out. No fevers, no lower extremity swelling or pain.  Past Medical History:  Diagnosis Date  . Allergic rhinitis   . Anemia    has had to have iron infusions-sees dr Twanna Hy  . Anxiety   . Arthritis   . Asthma    childhood  . Chronic low back pain   . CKD (chronic kidney disease)   . Diabetes mellitus without complication (HCC)    Type 2 on insulin pump  . GERD (gastroesophageal reflux disease)   . Headache    sinus headaches   . Hypercholesterolemia   . Hypertension   . Iron deficiency anemia   . Iron deficiency anemia 05/26/2015  . 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 Menopause   . Numbness in both hands    mostly at night  . Obesity   . Shortness of breath dyspnea    with exertion  . Vitamin D deficiency   . Wears glasses     Patient Active Problem List   Diagnosis Date Noted  . S/P laparoscopic sleeve gastrectomy Dec 2017 03/14/2016  . Iron deficiency anemia 05/26/2015  .  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 Primary osteoarthritis of right knee 08/05/2014  . Primary osteoarthritis of knee 08/05/2014  . Diabetes mellitus type 2, uncontrolled (HCC) 02/12/2014  . Essential hypertension 02/12/2014  . Ankle fracture, bimalleolar, closed 02/11/2014  . Essential hypertension, benign 01/28/2014  . Type II diabetes mellitus, uncontrolled (HCC) 01/21/2014    Past Surgical History:  Procedure Laterality Date  . COLONOSCOPY    . EYE SURGERY Bilateral    [facility]   . HERNIA REPAIR     umb hernia as child  . KNEE ARTHROSCOPY  02/12/2012   Procedure: ARTHROSCOPY KNEE;  Surgeon: Nestor Lewandowsky, MD;  Location: [facility];  Service: Orthopedics;  Laterality: Right;  Partial Lateral Meniscectomy, Debridement chondromalacia  . LAPAROSCOPIC GASTRIC SLEEVE RESECTION N/A 03/14/2016   Procedure: LAPAROSCOPIC GASTRIC SLEEVE RESECTION, WITH REPAIR OF HIATAL HERNIA REPAI, AND UPPER ENDO;  Surgeon: Luretha Murphy, MD;  Location: [facility];  Service: General;  Laterality: N/A;  . ORIF ANKLE FRACTURE Right 02/11/2014   Procedure: OPEN REDUCTION INTERNAL FIXATION (ORIF) RIGHT ANKLE FRACTURE;  Surgeon: Nestor Lewandowsky, MD;  Location: [facility];  Service: Orthopedics;  Laterality: Right;  . TOTAL KNEE ARTHROPLASTY Right 08/05/2014  . TOTAL KNEE ARTHROPLASTY Right 08/05/2014   Procedure: TOTAL KNEE ARTHROPLASTY;  Surgeon: Gean Birchwood, MD;  Location: [facility];  Service: Orthopedics;  Laterality: Right;  . UPPER GI ENDOSCOPY      OB History    No data available       Home Medications    Prior to Admission medications   Medication Sig Start Date End Date Taking? Authorizing Provider  albuterol (PROVENTIL HFA;VENTOLIN HFA) 108 (90 Base) MCG/ACT inhaler Inhale 2 puffs into the lungs every 6 (six) hours as needed for wheezing or shortness of breath.   Yes Historical Provider, MD  atorvastatin (LIPITOR) 40 MG tablet Take 40 mg by mouth at bedtime.    Yes Historical Provider, MD  CALCIUM PO  Take 1 capsule by mouth 3 (three) times daily.    Yes Historical Provider, MD  diphenhydrAMINE (BENADRYL) 25 MG tablet Take 25 mg by mouth every 6 (six) hours as needed for allergies.   Yes Historical Provider, MD  escitalopram (LEXAPRO) 10 MG tablet Take 10 mg by mouth daily.   Yes Historical Provider, MD  insulin aspart (NOVOLOG) 100 UNIT/ML injection Inject 0-20 Units into the skin every 4 (four) hours. 03/16/16  Yes Luretha Murphy, MD  lansoprazole (PREVACID) 15 MG capsule Take 15 mg by mouth daily.   Yes Historical Provider, MD  methocarbamol (ROBAXIN) 500 MG tablet Take 500 mg by mouth at bedtime.    Yes Historical Provider, MD  Multiple Vitamin (MULTIVITAMIN WITH MINERALS) TABS tablet Take 1 tablet by mouth 2 (two) times daily.   Yes Historical Provider, MD  valsartan (DIOVAN) 80 MG tablet Take 80 mg by mouth daily.   Yes Historical Provider, MD  glucose blood (ONETOUCH VERIO) test strip Use as instructed to check blood sugar once a day dx code E11.65 02/04/14   Reather Littler, MD  insulin glargine (LANTUS) 100 UNIT/ML injection Inject 0.1 mLs (10 Units total) into the skin at bedtime. 03/16/16   Luretha Murphy, MD  [facility] DELICA LANCETS FINE MISC Use to check blood sugar once a day dx code E11.65 Patient not taking: Reported on 04/28/2016 01/28/14   Reather Littler, MD    Family History No family history on file.  Social History Social History  Substance Use Topics  . Smoking status: Former Smoker    Packs/day: 0.25    Years: 1.00    Types: Cigarettes    Quit date: 02/08/1972  . Smokeless tobacco: Never Used  . Alcohol use No     Allergies   Lactose intolerance (gi) and Oatmeal   Review of Systems Review of Systems  Cardiovascular: Positive for syncope.  Gastrointestinal: Positive for vomiting.  All other systems reviewed and are negative.    Physical Exam Updated Vital Signs BP (!) 163/102   Pulse 75   Temp 97.9 F (36.6 C)   Resp 16   Ht 5' 5.5" (1.664 m)   Wt 217 lb  (98.4 kg)   SpO2 100%   BMI 35.56 kg/m   Physical Exam  Constitutional: She is oriented to person, place, and time. She appears well-developed and well-nourished.  HENT:  Head: Normocephalic and atraumatic.  Cardiovascular: Normal rate and regular rhythm.   No murmur heard. Pulmonary/Chest: Effort normal and breath sounds normal. No respiratory distress.  Abdominal: Soft. There is no tenderness. There is no rebound and no guarding.  Musculoskeletal: She exhibits no edema or tenderness.  Neurological: She is alert and oriented to person, place, and time.  Skin: Skin is warm and dry.  Psychiatric: She has a normal mood and affect. Her  behavior is normal.  Nursing note and vitals reviewed.    ED Treatments / Results  Labs (all labs ordered are listed, but only abnormal results are displayed) Labs Reviewed  URINALYSIS, ROUTINE W REFLEX MICROSCOPIC - Abnormal; Notable for the following:       Result Value   APPearance HAZY (*)    Glucose, UA 150 (*)    Ketones, ur 80 (*)    Protein, ur 30 (*)    Leukocytes, UA TRACE (*)    Squamous Epithelial / LPF 0-5 (*)    All other components within normal limits  COMPREHENSIVE METABOLIC PANEL - Abnormal; Notable for the following:    Glucose, Bld 203 (*)    BUN 23 (*)    Creatinine, Ser 1.07 (*)    GFR calc non Af Amer 54 (*)    All other components within normal limits  CBC    EKG  EKG Interpretation  Date/Time:  Friday April 28 2016 10:51:04 EST Ventricular Rate:  63 PR Interval:    QRS Duration: 105 QT Interval:  473 QTC Calculation: 485 R Axis:   29 Text Interpretation:  Sinus rhythm Low voltage, precordial leads Anteroseptal infarct, old Confirmed by Lincoln Brighamees, Liz [phone]([id number]) on 04/28/2016 11:26:13 AM Also confirmed by Lincoln Brighamees, Liz [phone]), editor BREWER, CCT, GLENN [phone]([id number])  on 04/28/2016 12:27:20 PM       Radiology No results found.  Procedures Procedures (including critical care time)  Medications Ordered in ED Medications   sodium chloride 0.9 % bolus 1,000 mL (0 mLs Intravenous Stopped 04/28/16 1403)     Initial Impression / Assessment and Plan / ED Course  I have reviewed the triage vital signs and the nursing notes.  Pertinent labs & imaging results that were available during my care of the patient were reviewed by me and considered in my medical decision making (see chart for details).     Patient here following a near syncopal episode 2 days ago and syncope today. She is feeling improved in the emergency department after IV fluids. Presentation is not consistent with PE, ACS, cardiogenic syncope. BMP is improved compared to priors. Discussed home care, outpatient follow up and return precautions.     Final Clinical Impressions(s) / ED Diagnoses   Final diagnoses:  Syncope and collapse  Dehydration    New Prescriptions Discharge Medication List as of 04/28/2016  1:37 PM       Tilden FossaElizabeth Keiden Deskin, MD 04/28/16 1757

## 2016-05-04 HISTORY — PX: TRANSTHORACIC ECHOCARDIOGRAM: SHX275

## 2016-05-10 ENCOUNTER — Telehealth: Payer: Self-pay | Admitting: Cardiology

## 2016-05-10 ENCOUNTER — Encounter: Payer: Self-pay | Admitting: Dietician

## 2016-05-10 ENCOUNTER — Encounter: Payer: BLUE CROSS/BLUE SHIELD | Attending: Surgery | Admitting: Dietician

## 2016-05-10 DIAGNOSIS — IMO0001 Reserved for inherently not codable concepts without codable children: Secondary | ICD-10-CM

## 2016-05-10 DIAGNOSIS — E1165 Type 2 diabetes mellitus with hyperglycemia: Secondary | ICD-10-CM

## 2016-05-10 DIAGNOSIS — E119 Type 2 diabetes mellitus without complications: Secondary | ICD-10-CM | POA: Insufficient documentation

## 2016-05-10 DIAGNOSIS — Z713 Dietary counseling and surveillance: Secondary | ICD-10-CM | POA: Diagnosis not present

## 2016-05-10 NOTE — Progress Notes (Signed)
  Follow-up visit:  8 Weeks Post-Operative sleeve gastrectomy Surgery  Medical Nutrition Therapy:  Appt start time: 0950 end time:  1030.  Primary concerns today: Post-operative Bariatric Surgery Nutrition Management. Karen ReichertMarian returns today having lost a total of 23 pounds. States that she has passed out twice since surgery. Went to emergency department and was diagnosed with "mid-grade dehydration" per patient. Both episodes occurred after drinking her protein shake and then walking her dog in the morning. Begins by feeling lightheaded and dizzy and wakes up on the floor a few minutes later. One episode was accompanied by vomiting. Learned that she had a mild heart attack in the past and did not know it. Has seen her surgeon and plans to follow up with a cardiologist, endocrinologist, and neurologist. No longer on insulin pump, taking insulin on sliding scale. Karen ReichertMarian checks her blood sugars 2-3x per day and has not seen any lows. Blood sugars and blood pressure unknown during syncope episodes.   Tried some salad and tolerated. Not tolerating eggs. Can tolerate 3-4 oz of meat at a time.  Surgery date: 03/14/2016 Surgery type: [facility] on 01/24/2016 (highest weight 238 lbs per patient) Weight today: 213.6 lbs Wt change: 12 lbs Total weight lost: 23 lbs  Weight loss goal: 160-165 lbs  TANITA  BODY COMP RESULTS  02/28/16 03/28/16 05/10/16   BMI (kg/m^2) 38.8 36.9 35   Fat Mass (lbs) 114.8 111.6 98.6   Fat Free Mass (lbs) 121.8 113.6 115   Total Body Water (lbs) 88.2 82 82.4    Preferred Learning Style:   No preference indicated   Learning Readiness:   Ready  24-hr recall: B (AM): [place] ready-to-drink protein shake (20g) Snk (AM):   L (PM): 3 pieces Malaw[place]key meat with cheese OR 3-4 oz chicken OR chili with extra beans OR cottage cheese with fruit (28g) Snk (PM):   D (PM): [place] yogurt or chili (15g) Snk (PM):   Fluid intake: 17 oz water, 33 oz  Pedialyte (6g sugar), three 8-oz protein shakes Estimated total protein intake: ~90g/day  Medications: see list Supplementation: taking  CBG monitoring: 2-3x per day Average CBG per patient: fasting in the morning: 109-360 mg/dL and [id number] mg/dL in the evening or during the day Last patient reported A1c: unknown  Using straws: no Drinking while eating: no Hair loss: yes Carbonated beverages: no N/V/D/C: some constipation  Dumping syndrome: no  Recent physical activity:  Walking dog in the morning; plans to join the gym  Progress Towards Goal(s):  In progress.  Handouts given during visit include:  Phase 3B lean protein + non starchy vegetables   Nutritional Diagnosis:  [place]-3.3 Overweight/obesity related to past poor dietary habits and physical inactivity as evidenced by patient w/ recent sleeve gastrectomy surgery following dietary guidelines for continued weight loss.  Intervention:  Nutrition counseling provided.  Teaching Method Utilized:  Visual Auditory Hands on  Barriers to learning/adherence to lifestyle change: none  Demonstrated degree of understanding via:  Teach Back   Monitoring/Evaluation:  Dietary intake, exercise, and body weight. Follow up in 1 months for 3 month post-op visit.

## 2016-05-10 NOTE — Telephone Encounter (Signed)
Received records from [facility] for appointment on 05/11/16 with Corine ShelterLuke Kilroy, PA.  Records put with Luke's schedule for 05/11/16. lp

## 2016-05-10 NOTE — Patient Instructions (Addendum)
Goals:  Follow Phase 3B: High Protein + Non-Starchy Vegetables  Eat 3-6 small meals/snacks, every 3-5 hrs  Increase lean protein foods to meet 60g goal  Increase fluid intake to 64oz +  Avoid drinking 15 minutes before, during and 30 minutes after eating  Aim for >30 min of physical activity daily  Try eating yogurt in the morning  Surgery date: 03/14/2016 Surgery type: sleeve gastrectomy [facility]: 236 lbs on 01/24/2016 Weight today: 213.6 lbs Wt change: 12 lbs Total weight lost: 25 lbs   TANITA  BODY COMP RESULTS  02/28/16 03/28/16 05/10/16   BMI (kg/m^2) 38.8 36.9 35   Fat Mass (lbs) 114.8 111.6 98.6   Fat Free Mass (lbs) 121.8 113.6 115   Total Body Water (lbs) 88.2 82 82.4

## 2016-05-11 ENCOUNTER — Ambulatory Visit (INDEPENDENT_AMBULATORY_CARE_PROVIDER_SITE_OTHER): Payer: BLUE CROSS/BLUE SHIELD | Admitting: Cardiology

## 2016-05-11 ENCOUNTER — Encounter (INDEPENDENT_AMBULATORY_CARE_PROVIDER_SITE_OTHER): Payer: BLUE CROSS/BLUE SHIELD

## 2016-05-11 ENCOUNTER — Encounter: Payer: Self-pay | Admitting: Cardiology

## 2016-05-11 VITALS — BP 155/84 | HR 72 | Ht 65.5 in | Wt 215.4 lb

## 2016-05-11 DIAGNOSIS — I951 Orthostatic hypotension: Secondary | ICD-10-CM | POA: Diagnosis not present

## 2016-05-11 DIAGNOSIS — R55 Syncope and collapse: Secondary | ICD-10-CM | POA: Diagnosis not present

## 2016-05-11 DIAGNOSIS — R9431 Abnormal electrocardiogram [ECG] [EKG]: Secondary | ICD-10-CM | POA: Diagnosis not present

## 2016-05-11 NOTE — Progress Notes (Signed)
Cardiology Office Note   Date:  05/11/2016   ID:  Karen LoboMarian D Sayegh, DOB May 09, 1955, MRN [id number]  PCP:  Dolan AmenNORWOOD, DOROTHY, MD  Cardiologist:   Rollene RotundaJames Shataya Winkles, MD  Referring:  Baxter KailMathew Martin, MD  Chief Complaint  Patient presents with  . Loss of Consciousness      History of Present Illness: Karen Gardner is a [age] y.o. female who presents for evaluation of syncope. [facility]  She's had 3 syncopal episode. I was able to review some records from the emergency room on January 26. She had an episode then about 1 week before and in June of last year when she also was in the emergency room.  All 3 episodes of Her neck she's been walking her dog and has come in. She'll feel lightheaded and have some warning. Last time she was able to sit down and then passed out briefly. She feels fine prior to this. She doesn't describe orthostatic symptoms. She might be out for a few minutes. She did get nauseated and rule out with the last episode. With the first episode she had diaphoresis. She doesn't describe palpitations. She otherwise doesn't describe chest pressure, neck or arm discomfort. Her labs in the emergency room were okay. Of note she did have gastric sleeve surgery and has been trying to hydrate. She's been having watery stools. He's not been having any shortness of breath, PND or orthopnea. She's had no prior cardiac workup or history.  Past Medical History:  Diagnosis Date  . Allergic rhinitis   . Anemia    has had to have iron infusions-sees dr Twanna Hyenever  . Anxiety   . Arthritis   . Asthma    childhood  . Chronic low back pain   . CKD (chronic kidney disease)   . Diabetes mellitus without complication (HCC)    Type 2 on insulin pump  . GERD (gastroesophageal reflux disease)   . Headache    sinus headaches   . Hypercholesterolemia   . Hypertension   . Iron deficiency anemia 05/26/2015  . 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 Menopause   . Numbness in  both hands    mostly at night  . Obesity   . Shortness of breath dyspnea    with exertion  . Vitamin D deficiency     Past Surgical History:  Procedure Laterality Date  . COLONOSCOPY    . EYE SURGERY Bilateral    [facility]   . HERNIA REPAIR     umb hernia as child  . KNEE ARTHROSCOPY  02/12/2012   Procedure: ARTHROSCOPY KNEE;  Surgeon: Nestor LewandowskyFrank J Rowan, MD;  Location: [facility];  Service: Orthopedics;  Laterality: Right;  Partial Lateral Meniscectomy, Debridement chondromalacia  . LAPAROSCOPIC GASTRIC SLEEVE RESECTION N/A 03/14/2016   Procedure: LAPAROSCOPIC GASTRIC SLEEVE RESECTION, WITH REPAIR OF HIATAL HERNIA REPAI, AND UPPER ENDO;  Surgeon: Luretha MurphyMatthew Martin, MD;  Location: [facility];  Service: General;  Laterality: N/A;  . ORIF ANKLE FRACTURE Right 02/11/2014   Procedure: OPEN REDUCTION INTERNAL FIXATION (ORIF) RIGHT ANKLE FRACTURE;  Surgeon: Nestor LewandowskyFrank J Rowan, MD;  Location: [facility];  Service: Orthopedics;  Laterality: Right;  . TOTAL KNEE ARTHROPLASTY Right 08/05/2014   Procedure: TOTAL KNEE ARTHROPLASTY;  Surgeon: Gean BirchwoodFrank Rowan, MD;  Location: [facility];  Service: Orthopedics;  Laterality: Right;  . UPPER GI ENDOSCOPY       Current Outpatient Prescriptions  Medication Sig Dispense Refill  . atorvastatin (LIPITOR) 40 MG tablet  Take 40 mg by mouth at bedtime.     Marland Kitchen CALCIUM PO Take 1 capsule by mouth 3 (three) times daily.     . diphenhydrAMINE (BENADRYL) 25 MG tablet Take 25 mg by mouth every 6 (six) hours as needed for allergies.    Marland Kitchen escitalopram (LEXAPRO) 10 MG tablet Take 10 mg by mouth daily.    Marland Kitchen glucose blood (ONETOUCH VERIO) test strip Use as instructed to check blood sugar once a day dx code E11.65 50 each 2  . insulin aspart (NOVOLOG) 100 UNIT/ML injection Inject 0-20 Units into the skin every 4 (four) hours. 10 mL 11  . lansoprazole (PREVACID) 15 MG capsule Take 15 mg by mouth daily.    . methocarbamol (ROBAXIN) 500 MG tablet Take 500 mg by mouth at bedtime.     . Multiple  Vitamin (MULTIVITAMIN WITH MINERALS) TABS tablet Take 1 tablet by mouth 2 (two) times daily.    . ondansetron (ZOFRAN-ODT) 4 MG disintegrating tablet DIS 1 T ON THE TONGUE Q 6 H PRN  0  . ONETOUCH DELICA LANCETS FINE MISC Use to check blood sugar once a day dx code E11.65 100 each 3  . valsartan (DIOVAN) 80 MG tablet Take 80 mg by mouth daily.     No current facility-administered medications for this visit.     Allergies:   Lactose intolerance (gi) and Oatmeal    Social History:  The patient  reports that she quit smoking about 44 years ago. Her smoking use included Cigarettes. She has a 0.25 pack-year smoking history. She has never used smokeless tobacco. She reports that she does not drink alcohol or use drugs.   Family History:  The patient's family history includes Alcohol abuse in her father; Heart attack (age of onset: 70) in her sister.    ROS:  Please see the history of present illness.   Otherwise, review of systems are positive for none.   All other systems are reviewed and negative.    PHYSICAL EXAM: VS:  BP (!) 155/84 (BP Location: Left Arm, Patient Position: Sitting, Cuff Size: Normal)   Pulse 72   Ht 5' 5.5" (1.664 m)   Wt 215 lb 6 oz (97.7 kg)   BMI 35.30 kg/m  , BMI Body mass index is 35.3 kg/m. GENERAL:  Well appearing HEENT:  Pupils equal round and reactive, fundi not visualized, oral mucosa unremarkable NECK:  No jugular venous distention, waveform within normal limits, carotid upstroke brisk and symmetric, no bruits, no thyromegaly LYMPHATICS:  No cervical, inguinal adenopathy LUNGS:  Clear to auscultation bilaterally BACK:  No CVA tenderness CHEST:  Unremarkable HEART:  PMI not displaced or sustained,S1 and S2 within normal limits, no S3, no S4, no clicks, no rubs, no murmurs ABD:  Flat, positive bowel sounds normal in frequency in pitch, no bruits, no rebound, no guarding, no midline pulsatile mass, no hepatomegaly, no splenomegaly EXT:  2 plus pulses  throughout, no edema, no cyanosis no clubbing SKIN:  No rashes no nodules NEURO:  Cranial nerves II through XII grossly intact, motor grossly intact throughout PSYCH:  Cognitively intact, oriented to person place and time    EKG:  EKG is ordered today. The ekg ordered today demonstrates Sinus rhythm, rate 60, axis within normal limits, short PR interval, poor anterior R-wave progression, cannot exclude old anteroseptal infarct, no acute ST-T wave changes.   Recent Labs: 04/28/2016: ALT 20; BUN 23; Creatinine, Ser 1.07; Hemoglobin 12.5; Platelets 175; Potassium 3.6; Sodium 140  Lipid Panel No results found for: CHOL, TRIG, HDL, CHOLHDL, VLDL, LDLCALC, LDLDIRECT    Wt Readings from Last 3 Encounters:  05/11/16 215 lb 6 oz (97.7 kg)  05/10/16 213 lb 9.6 oz (96.9 kg)  04/28/16 217 lb (98.4 kg)      Other studies Reviewed: Additional studies/ records that were reviewed today include: ED records. Review of the above records demonstrates:  Please see elsewhere in the note.     ASSESSMENT AND PLAN:  SYNCOPE:    These sound like vagal episodes.  I am going to start with a 21 day event monitor. In anticipation of these being related to some vasovagal episode and going to have her wear compression stockings. She'll come back to see me after the event monitor.  She also had a mild orthostatic blood pressure drop.  ABNORMAL EKG:  I will order an echocardiogram.    HTN:    Her blood pressure was elevated. However, given the orthostasis and probable vagal episodes are not going to change her medications at this point.    Current medicines are reviewed at length with the patient today.  The patient does not have concerns regarding medicines.  The following changes have been made:  no change  Labs/ tests ordered today include:   Orders Placed This Encounter  Procedures  . Cardiac event monitor  . EKG 12-Lead  . ECHOCARDIOGRAM COMPLETE     Disposition:   FU with me after the  event monitor.      Signed, Rollene Rotunda, MD  05/11/2016 5:32 PM    [facility]

## 2016-05-11 NOTE — Patient Instructions (Addendum)
Medication Instructions:  Continue current medications  Labwork: None Ordered  Testing/Procedures: Your physician has requested that you have an echocardiogram. Echocardiography is a painless test that uses sound waves to create images of your heart. It provides your doctor with information about the size and shape of your heart and how well your heart's chambers and valves are working. This procedure takes approximately one hour. There are no restrictions for this procedure.  Your physician has recommended that you wear an event monitor. Event monitors are medical devices that record the heart's electrical activity. Doctors most often us these monitors to diagnose arrhythmias. Arrhythmias are problems with the speed or rhythm of the heartbeat. The monitor is a small, portable device. You can wear one while you do your normal daily activities. This is usually used to diagnose what is causing palpitations/syncope (passing out).  Follow-Up: Your physician recommends that you schedule a follow-up appointment in: After Test and Monitor   Any Other Special Instructions Will Be Listed Below (If Applicable).   If you need a refill on your cardiac medications before your next appointment, please call your pharmacy.

## 2016-05-30 ENCOUNTER — Other Ambulatory Visit: Payer: Self-pay

## 2016-05-30 ENCOUNTER — Ambulatory Visit (HOSPITAL_COMMUNITY): Payer: BLUE CROSS/BLUE SHIELD | Attending: Cardiovascular Disease

## 2016-05-30 DIAGNOSIS — R9431 Abnormal electrocardiogram [ECG] [EKG]: Secondary | ICD-10-CM | POA: Diagnosis present

## 2016-05-30 DIAGNOSIS — I071 Rheumatic tricuspid insufficiency: Secondary | ICD-10-CM | POA: Insufficient documentation

## 2016-06-01 DIAGNOSIS — R55 Syncope and collapse: Secondary | ICD-10-CM

## 2016-06-05 NOTE — Progress Notes (Signed)
Cardiology Office Note   Date:  06/06/2016   ID:  Karen Gardner, DOB 1955/07/08, MRN [id number]  PCP:  Dolan AmenNORWOOD, DOROTHY, MD  Cardiologist:   Rollene RotundaJames Arieanna Pressey, MD    Chief Complaint  Patient presents with  . Loss of Consciousness      History of Present Illness: Karen LoboMarian D Pope is a 61 y.o. female who presents for evaluation of syncope.  After the last visit she had a normal echocardiogram.   Since I last saw her she has had no further syncope.  She is walking her dog, which was when she had syncope, and she feels well.  The patient denies any new symptoms such as chest discomfort, neck or arm discomfort. There has been no new shortness of breath, PND or orthopnea. There have been no reported palpitations, presyncope or syncope.     Past Medical History:  Diagnosis Date  . Allergic rhinitis   . Anemia    has had to have iron infusions-sees dr Twanna Hyenever  . Anxiety   . Arthritis   . Asthma    childhood  . Chronic low back pain   . CKD (chronic kidney disease)   . Diabetes mellitus without complication (HCC)    Type 2 on insulin pump  . GERD (gastroesophageal reflux disease)   . Headache    sinus headaches   . Hypercholesterolemia   . Hypertension   . Iron deficiency anemia 05/26/2015  . 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 Menopause   . Numbness in both hands    mostly at night  . Obesity   . Shortness of breath dyspnea    with exertion  . Vitamin D deficiency     Past Surgical History:  Procedure Laterality Date  . COLONOSCOPY    . EYE SURGERY Bilateral    [facility]   . HERNIA REPAIR     umb hernia as child  . KNEE ARTHROSCOPY  02/12/2012   Procedure: ARTHROSCOPY KNEE;  Surgeon: Nestor LewandowskyFrank J Rowan, MD;  Location: [facility];  Service: Orthopedics;  Laterality: Right;  Partial Lateral Meniscectomy, Debridement chondromalacia  . LAPAROSCOPIC GASTRIC SLEEVE RESECTION N/A 03/14/2016   Procedure: LAPAROSCOPIC GASTRIC SLEEVE RESECTION, WITH REPAIR OF HIATAL HERNIA  REPAI, AND UPPER ENDO;  Surgeon: Luretha MurphyMatthew Martin, MD;  Location: [facility];  Service: General;  Laterality: N/A;  . ORIF ANKLE FRACTURE Right 02/11/2014   Procedure: OPEN REDUCTION INTERNAL FIXATION (ORIF) RIGHT ANKLE FRACTURE;  Surgeon: Nestor LewandowskyFrank J Rowan, MD;  Location: [facility];  Service: Orthopedics;  Laterality: Right;  . TOTAL KNEE ARTHROPLASTY Right 08/05/2014   Procedure: TOTAL KNEE ARTHROPLASTY;  Surgeon: Gean BirchwoodFrank Rowan, MD;  Location: [facility];  Service: Orthopedics;  Laterality: Right;  . UPPER GI ENDOSCOPY       Current Outpatient Prescriptions  Medication Sig Dispense Refill  . atorvastatin (LIPITOR) 40 MG tablet Take 40 mg by mouth at bedtime.     Marland Kitchen. CALCIUM PO Take 1 capsule by mouth 3 (three) times daily.     . diphenhydrAMINE (BENADRYL) 25 MG tablet Take 25 mg by mouth every 6 (six) hours as needed for allergies.    Marland Kitchen. escitalopram (LEXAPRO) 10 MG tablet Take 10 mg by mouth daily.    Marland Kitchen. glucose blood (ONETOUCH VERIO) test strip Use as instructed to check blood sugar once a day dx code E11.65 50 each 2  . insulin aspart (NOVOLOG) 100 UNIT/ML injection Inject 0-20 Units into the skin every 4 (four) hours. 10 mL 11  .  lansoprazole (PREVACID) 15 MG capsule Take 15 mg by mouth daily.    . methocarbamol (ROBAXIN) 500 MG tablet Take 500 mg by mouth at bedtime.     . Multiple Vitamin (MULTIVITAMIN WITH MINERALS) TABS tablet Take 1 tablet by mouth 2 (two) times daily.    . ondansetron (ZOFRAN-ODT) 4 MG disintegrating tablet DIS 1 T ON THE TONGUE Q 6 H PRN  0  . ONETOUCH DELICA LANCETS FINE MISC Use to check blood sugar once a day dx code E11.65 100 each 3  . valsartan (DIOVAN) 80 MG tablet Take 80 mg by mouth daily.     No current facility-administered medications for this visit.     Allergies:   Lactose intolerance (gi) and Oatmeal    ROS:  Please see the history of present illness.   Otherwise, review of systems are positive for none.   All other systems are reviewed and negative.    PHYSICAL  EXAM: VS:  BP (!) 150/86   Pulse 72   Ht 5' 5.5" (1.664 m)   Wt 211 lb (95.7 kg)   BMI 34.58 kg/m  , BMI Body mass index is 34.58 kg/m. HEENT:  Pupils equal round and reactive, fundi not visualized, oral mucosa unremarkable NECK:  No jugular venous distention, waveform within normal limits, carotid upstroke brisk and symmetric, no bruits, no thyromegaly LUNGS:  Clear to auscultation bilaterally CHEST:  Unremarkable HEART:  PMI not displaced or sustained,S1 and S2 within normal limits, no S3, no S4, no clicks, no rubs, no murmurs ABD:  Flat, positive bowel sounds normal in frequency in pitch, no bruits, no rebound, no guarding, no midline pulsatile mass, no hepatomegaly, no splenomegaly EXT:  2 plus pulses throughout, no edema, no cyanosis no clubbing    EKG:  EKG is not ordered today.   Recent Labs: 04/28/2016: ALT 20; BUN 23; Creatinine, Ser 1.07; Hemoglobin 12.5; Platelets 175; Potassium 3.6; Sodium 140    Lipid Panel No results found for: CHOL, TRIG, HDL, CHOLHDL, VLDL, LDLCALC, LDLDIRECT    Wt Readings from Last 3 Encounters:  06/06/16 211 lb (95.7 kg)  05/11/16 215 lb 6 oz (97.7 kg)  05/10/16 213 lb 9.6 oz (96.9 kg)      Other studies Reviewed: Additional studies/ records that were reviewed today include: None. Review of the above records demonstrates:      ASSESSMENT AND PLAN:  SYNCOPE:    These sound like vagal episodes.  The event monitor demonstrated NSR.  She has had no further events.  She will stay hydrated and use compression stockings as she did have some orthostasis.  No other work up is indicated.   ABNORMAL EKG:  She had a normal echo.   No further work up.   HTN:    Her blood pressure was elevated. However, given the orthostasis and probable vagal episodes are not going to change her medications at this point.     Current medicines are reviewed at length with the patient today.  The patient does not have concerns regarding medicines.  The following  changes have been made:   None  Labs/ tests ordered today include:   None No orders of the defined types were placed in this encounter.    Disposition:   FU with me as needed.     Signed, Rollene Rotunda, MD  06/06/2016 8:39 PM    [facility]

## 2016-06-06 ENCOUNTER — Ambulatory Visit (INDEPENDENT_AMBULATORY_CARE_PROVIDER_SITE_OTHER): Payer: BLUE CROSS/BLUE SHIELD | Admitting: Cardiology

## 2016-06-06 ENCOUNTER — Encounter: Payer: Self-pay | Admitting: Cardiology

## 2016-06-06 VITALS — BP 150/86 | HR 72 | Ht 65.5 in | Wt 211.0 lb

## 2016-06-06 DIAGNOSIS — R9431 Abnormal electrocardiogram [ECG] [EKG]: Secondary | ICD-10-CM

## 2016-06-06 DIAGNOSIS — R55 Syncope and collapse: Secondary | ICD-10-CM | POA: Diagnosis not present

## 2016-06-06 DIAGNOSIS — I951 Orthostatic hypotension: Secondary | ICD-10-CM | POA: Diagnosis not present

## 2016-06-06 NOTE — Patient Instructions (Signed)
Medication Instructions:  Continue current medications  Labwork: None Ordered  Testing/Procedures: None Ordered  Follow-Up: Your physician recommends that you schedule a follow-up appointment in: As Needed   Any Other Special Instructions Will Be Listed Below (If Applicable).   If you need a refill on your cardiac medications before your next appointment, please call your pharmacy.   

## 2016-06-07 ENCOUNTER — Encounter: Payer: BLUE CROSS/BLUE SHIELD | Attending: Surgery | Admitting: Skilled Nursing Facility1

## 2016-06-07 ENCOUNTER — Encounter: Payer: Self-pay | Admitting: Skilled Nursing Facility1

## 2016-06-07 DIAGNOSIS — E1165 Type 2 diabetes mellitus with hyperglycemia: Secondary | ICD-10-CM

## 2016-06-07 DIAGNOSIS — IMO0001 Reserved for inherently not codable concepts without codable children: Secondary | ICD-10-CM

## 2016-06-07 DIAGNOSIS — Z713 Dietary counseling and surveillance: Secondary | ICD-10-CM | POA: Diagnosis not present

## 2016-06-07 DIAGNOSIS — E119 Type 2 diabetes mellitus without complications: Secondary | ICD-10-CM | POA: Diagnosis present

## 2016-06-07 NOTE — Patient Instructions (Addendum)
-  Keep trying to get in at least 64 fluid ounces in a day: sip on some drink all day long  -Make an appointment with your eye doctor  -anything below 70 is low: treat it   -Have beans to ensure you do not get low blood sugar  -Try kefir  -Try sugar free low fat yogurt and put peanut butter powder in it  -Get back to the gym and play in the water

## 2016-06-07 NOTE — Progress Notes (Signed)
  Follow-up visit:  8 Weeks Post-Operative sleeve gastrectomy Surgery  Medical Nutrition Therapy:  Appt start time: 0950 end time:  1030.  Primary concerns today: Post-operative Bariatric Surgery Nutrition Management. Pt states she has an appointment with her endocrinologist in a few weeks. According to pts tanita she has lost water. Pt states her bowel movements seems to be to "creamy". Pt states a couple of nights ago she had low blood sugar needing to treat with glucose tablets.   Surgery date: 03/14/2016 Surgery type: sleeve gastrectomy [facility]: 236 lbs on 01/24/2016 (highest weight 238 lbs per patient) Weight today: 209.2 lbs Wt change: 4.3 lbs  Weight loss goal: 160-165 lbs  TANITA  BODY COMP RESULTS  02/28/16 03/28/16 05/10/16 06/07/2016   BMI (kg/m^2) 38.8 36.9 35 34.3   Fat Mass (lbs) 114.8 111.6 98.6 98.4   Fat Free Mass (lbs) 121.8 113.6 115 110.8   Total Body Water (lbs) 88.2 82 82.4 79.4    Preferred Learning Style:   No preference indicated   Learning Readiness:   Ready  24-hr recall: B (AM): Unjury ready-to-drink protein shake-----yogurt(20g) Snk (AM):   L (PM): pack of tuna with crackers OR 3-4 oz chicken OR chili with extra beans OR cottage cheese with fruit (28g) Snk (PM): chicken or  Protein drink D (PM):  Salmon or riced cauliflower and beans (15g) Snk (PM):   Fluid intake: 32 oz water, 2  8-oz protein shakes, decaf coffee: maybe 48 ounces of fluid Estimated total protein intake: ~90g/day  Medications: see list Supplementation: taking calcium and soft chew multi with iron  CBG monitoring: 2-3x per day Average CBG per patient: fasting in the morning: 93-115 mg/dL and 300mg /dL in the evening or during the day Last patient reported A1c: unknown  Using straws: no Drinking while eating: tried a couple times but it was uncomfortable  Hair loss: A lot Continuously  Carbonated beverages: no N/V/D/C/GAS: once with eggs, twice with eggs, no  just soft stool, no  Dumping syndrome: no Having you been chewing well: no Chewing/swallowing difficulties: no Changes in vision:  Seeing the eye doctor soon Changes to mood/headaches: sinus issues Hair loss/Changes to skin/Changes to nails: normal dryness, unknown due to nail polish Any difficulty focusing or concentrating: sometimes with progression describing memory in not what it used to be  Sweating: no Dizziness/Lightheaded: no Palpitations:  no Abdominal Pain:  no  Recent physical activity:  Walking dog in the morning; plans to join the gym  Progress Towards Goal(s):  In progress.  Handouts given during visit include:  Phase 3B lean protein + non starchy vegetables   Nutritional Diagnosis:  Barton-3.3 Overweight/obesity related to past poor dietary habits and physical inactivity as evidenced by patient w/ recent sleeve gastrectomy surgery following dietary guidelines for continued weight loss.  Intervention:  Nutrition counseling provided. Goals: -Keep trying to get in at least 64 fluid ounces in a day: sip on some drink all day long -Make an appointment with your eye doctor -anything below 70 is low: treat it  -Have beans to ensure you do not get low blood sugar -Try kefir -Try sugar free low fat yogurt and put peanut butter powder in it -Get back to the gym and play in the water  Teaching Method Utilized:  Visual Auditory Hands on  Barriers to learning/adherence to lifestyle change: none  Demonstrated degree of understanding via:  Teach Back   Monitoring/Evaluation:  Dietary intake, exercise, and body weight.

## 2016-06-08 ENCOUNTER — Encounter: Payer: Self-pay | Admitting: Cardiology

## 2016-07-11 ENCOUNTER — Ambulatory Visit
Admission: RE | Admit: 2016-07-11 | Discharge: 2016-07-11 | Disposition: A | Discharge status: Home / Self Care | Payer: BLUE CROSS/BLUE SHIELD | Source: Ambulatory Visit | Attending: Internal Medicine | Admitting: Internal Medicine

## 2016-07-11 ENCOUNTER — Other Ambulatory Visit: Payer: Self-pay | Admitting: Internal Medicine

## 2016-07-11 DIAGNOSIS — M79674 Pain in right toe(s): Secondary | ICD-10-CM

## 2016-07-14 ENCOUNTER — Encounter: Payer: Self-pay | Admitting: Podiatry

## 2016-07-14 ENCOUNTER — Ambulatory Visit (INDEPENDENT_AMBULATORY_CARE_PROVIDER_SITE_OTHER): Payer: BLUE CROSS/BLUE SHIELD | Admitting: Podiatry

## 2016-07-14 VITALS — BP 152/83 | HR 69 | Resp 16

## 2016-07-14 DIAGNOSIS — S92514A Nondisplaced fracture of proximal phalanx of right lesser toe(s), initial encounter for closed fracture: Secondary | ICD-10-CM | POA: Diagnosis not present

## 2016-07-14 NOTE — Progress Notes (Signed)
   Subjective:    Patient ID: Karen Gardner, female    DOB: 16-Nov-1955, 61 y.o.   MRN: [id number]  HPI 61 year old female presents the also concerns an evaluation of right fifth toe fracture. She states that she stubbed her toe about 2-3 weeks ago. While she was seen Dr. Harrel Carina yesterday patient for which reveal fracture she presents today for follow-up. She states the toe is still sore and his bruise. She's been trying to take the toe at home but has not been helping much. She denies any other recent injury at time the accident. No other complaints at this time. She denies any numbness or tingling or any claudication symptoms.   Review of Systems  All other systems reviewed and are negative.      Objective:   Physical Exam General: AAO x3, NAD  Dermatological: Skin is warm, dry and supple bilateral. Nails x 10 are well manicured; remaining integument appears unremarkable at this time. There are no open sores, no preulcerative lesions, no rash or signs of infection present.  Vascular: Dorsalis Pedis artery and Posterior Tibial artery pedal pulses are 2/4 bilateral with immedate capillary fill time.  There is no pain with calf compression, swelling, warmth, erythema.   Neruologic: Grossly intact via light touch bilateral. Vibratory intact via tuning fork bilateral. Protective threshold with Semmes Wienstein monofilament intact to all pedal sites bilateral.   Musculoskeletal: There is tenderness to the base of the fifth toe on the right side. There is mild edema to the fifth toe but there is no erythema. There is no open sores identified. There is no pain to the metatarsals are to other digits. No other areas of tenderness identified bilaterally. Muscular strength 5/5 in all groups tested bilateral.  Gait: Unassisted, Nonantalgic.     Assessment & Plan:  61 year old female right fifth digit fracture. -Treatment options discussed including all alternatives, risks, and complications -Chart  and previous x-rays from yesterday were reviewed. -Continue taping the toe. Also placed her in a surgical shoe today. -Ice to the area. -RTC as scheduled or sooner if needed.   *x-ray next appointment   Ovid Curd, DPM

## 2016-07-14 NOTE — Patient Instructions (Signed)
Toe Fracture A toe fracture is a break in one of the toe bones (phalanges). What are the causes? This condition may be caused by:  Dropping a heavy object on your toe.  Stubbing your toe.  Overusing your toe or doing repetitive exercise.  Twisting or stretching your toe out of place. What increases the risk? This condition is more likely to develop in people who:  Play contact sports.  Have a bone disease.  Have a low calcium level. What are the signs or symptoms? The main symptoms of this condition are swelling and pain in the toe. The pain may get worse with standing or walking. Other symptoms include:  Bruising.  Stiffness.  Numbness.  A change in the way the toe looks.  Broken bones that poke through the skin.  Blood beneath the toenail. How is this diagnosed? This condition is diagnosed with a physical exam. You may also have X-rays. How is this treated? Treatment for this condition depends on the type of fracture and its severity. Treatment may involve:  Taping the broken toe to a toe that is next to it (buddy taping). This is the most common treatment for fractures in which the bone has not moved out of place (nondisplaced fracture).  Wearing a shoe that has a wide, rigid sole to protect the toe and to limit its movement.  Wearing a walking cast.  Having a procedure to move the toe back into place.  Surgery. This may be needed:  If there are many pieces of broken bone that are out of place (displaced).  If the toe joint breaks.  If the bone breaks through the skin.  Physical therapy. This is done to help regain movement and strength in the toe. You may need follow-up X-rays to make sure that the bone is healing well and staying in position. Follow these instructions at home: If you have a cast:   Do not stick anything inside the cast to scratch your skin. Doing that increases your risk of infection.  Check the skin around the cast every day. Report  any concerns to your health care provider. You may put lotion on dry skin around the edges of the cast. Do not apply lotion to the skin underneath the cast.  Do not put pressure on any part of the cast until it is fully hardened. This may take several hours.  Keep the cast clean and dry. Bathing   Do not take baths, swim, or use a hot tub until your health care provider approves. Ask your health care provider if you can take showers. You may only be allowed to take sponge baths for bathing.  If your health care provider approves bathing and showering, cover the cast or bandage (dressing) with a watertight plastic bag to protect it from water. Do not let the cast or dressing get wet. Managing pain, stiffness, and swelling   If you do not have a cast, apply ice to the injured area, if directed.  Put ice in a plastic bag.  Place a towel between your skin and the bag.  Leave the ice on for 20 minutes, 2-3 times per day.  Move your toes often to avoid stiffness and to lessen swelling.  Raise (elevate) the injured area above the level of your heart while you are sitting or lying down. Driving   Do not drive or operate heavy machinery while taking pain medicine.  Do not drive while wearing a cast on a foot that you   use for driving. Activity   Return to your normal activities as directed by your health care provider. Ask your health care provider what activities are safe for you.  Perform exercises daily as directed by your health care provider or physical therapist. Safety   Do not use the injured limb to support your body weight until your health care provider says that you can. Use crutches or other assistive devices as directed by your health care provider. General instructions   If your toe was treated with buddy taping, follow your health care provider's instructions for changing the gauze and tape. Change it more often:  The gauze and tape get wet. If this happens, dry the  space between the toes.  The gauze and tape are too tight and cause your toe to become pale or numb.  Wear a protective shoe as directed by your health care provider. If you were not given a protective shoe, wear sturdy, supportive shoes. Your shoes should not pinch your toes and should not fit tightly against your toes.  Do not use any tobacco products, including cigarettes, chewing tobacco, or e-cigarettes. Tobacco can delay bone healing. If you need help quitting, ask your health care provider.  Take medicines only as directed by your health care provider.  Keep all follow-up visits as directed by your health care provider. This is important. Contact a health care provider if:  You have a fever.  Your pain medicine is not helping.  Your toe is cold.  Your toe is numb.  You still have pain after one week of rest and treatment.  You still have pain after your health care provider has said that you can start walking again.  You have pain, tingling, or numbness in your foot that is not going away. Get help right away if:  You have severe pain.  You have redness or inflammation in your toe that is getting worse.  You have pain or numbness in your toe that is getting worse.  Your toe turns blue. This information is not intended to replace advice given to you by your health care provider. Make sure you discuss any questions you have with your health care provider. Document Released: 03/17/2000 Document Revised: 11/22/2015 Document Reviewed: 01/14/2014 Elsevier Interactive Patient Education  2017 [employer]  

## 2016-08-04 ENCOUNTER — Ambulatory Visit: Payer: BLUE CROSS/BLUE SHIELD | Admitting: Podiatry
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
## 2017-03-08 ENCOUNTER — Other Ambulatory Visit: Payer: Self-pay | Admitting: Internal Medicine

## 2017-03-08 DIAGNOSIS — Z1231 Encounter for screening mammogram for malignant neoplasm of breast: Secondary | ICD-10-CM

## 2017-05-09 ENCOUNTER — Encounter: Payer: BLUE CROSS/BLUE SHIELD | Attending: Internal Medicine | Admitting: *Deleted

## 2017-05-09 DIAGNOSIS — Z9884 Bariatric surgery status: Secondary | ICD-10-CM | POA: Insufficient documentation

## 2017-05-09 DIAGNOSIS — Z794 Long term (current) use of insulin: Secondary | ICD-10-CM | POA: Insufficient documentation

## 2017-05-09 DIAGNOSIS — Z713 Dietary counseling and surveillance: Secondary | ICD-10-CM | POA: Insufficient documentation

## 2017-05-09 DIAGNOSIS — E1165 Type 2 diabetes mellitus with hyperglycemia: Secondary | ICD-10-CM | POA: Diagnosis not present

## 2017-05-09 DIAGNOSIS — E669 Obesity, unspecified: Secondary | ICD-10-CM | POA: Diagnosis not present

## 2017-05-09 NOTE — Progress Notes (Signed)
Diabetes Self-Management Education  Visit Type: First/Initial  Appt. Start Time: 0930 Appt. End Time: 1055  05/09/2017  [name], identified by name and date of birth, is a 62 y.o. female with a diagnosis of Diabetes: Type 2. Patient states history of diabetes for at least 45 years. She states she lost about 100 pounds several years ago, then gained some of it back. She then had Bariatric surgery (sleeve) and her insulin requirements have decreased significantly since then. She is an avid walker, stating she walks about 45 minutes twice daily with her dog, unless the weather is too cold. She feels her increase in her A1c recently may be partially due to limited walking with winter storms.   ASSESSMENT  There were no vitals taken for this visit. There is no height or weight on file to calculate BMI.  Diabetes Self-Management Education - 05/09/17 0933      Visit Information   Visit Type  First/Initial      Initial Visit   Diabetes Type  Type 2    Are you currently following a meal plan?  Yes    What type of meal plan do you follow?  bariatric    Are you taking your medications as prescribed?  Yes    Date Diagnosed  45 years ago      Health Coping   How would you rate your overall health?  Fair      Psychosocial Assessment   Patient Belief/Attitude about Diabetes  Motivated to manage diabetes    Self-care barriers  None    Self-management support  Doctor's office    Other persons present  Patient    Patient Concerns  Nutrition/Meal planning;Glycemic Control;Weight Control    Special Needs  None    Preferred Learning Style  No preference indicated    Learning Readiness  Change in progress    How often do you need to have someone help you when you read instructions, pamphlets, or other written materials from your doctor or pharmacy?  1 - Never    What is the last grade level you completed in school?  college      Pre-Education Assessment   Patient understands the  diabetes disease and treatment process.  Needs Review    Patient understands incorporating nutritional management into lifestyle.  Needs Instruction    Patient undertands incorporating physical activity into lifestyle.  Demonstrates understanding / competency    Patient understands using medications safely.  Needs Review    Patient understands monitoring blood glucose, interpreting and using results  Needs Review    Patient understands prevention, detection, and treatment of acute complications.  Demonstrates understanding / competency    Patient understands prevention, detection, and treatment of chronic complications.  Needs Review    Patient understands how to develop strategies to address psychosocial issues.  Needs Review    Patient understands how to develop strategies to promote health/change behavior.  Demonstrates understanding / competency      Complications   Last HgB A1C per patient/outside source  7.9 %    How often do you check your blood sugar?  > 4 times/day Has Libre CGM    Fasting Blood glucose range (mg/dL)  16-109    Postprandial Blood glucose range (mg/dL)  60-454    Number of hypoglycemic episodes per month  4    Can you tell when your blood sugar is low?  Yes    What do you do if your blood sugar is low?  4 Glucose tablets    Have you had a dilated eye exam in the past 12 months?  No    Have you had a dental exam in the past 12 months?  Yes    Are you checking your feet?  Yes    How many days per week are you checking your feet?  7      Dietary Intake   Breakfast  protein drink, regular oatmeal OR Glucerna OR yogurt OR [place] sausage added to oatmeal, no bread    Snack (morning)  protein bar or occasionally fresh fruit    Lunch  salad OR oatmeal OR protein drink    Snack (afternoon)  almonds OR protein chips OR unsalted Pringle chips    Dinner  lean meat, occasioanlly vegetables, whole grain starch occasionally OR egg omelet with [place] sausage and cheese    Snack  (evening)  popcorn OR 2 handfuls of almonds OR Pringles    Beverage(s)  beverages are after meals due to bariatric surgery      Exercise   Exercise Type  Moderate (swimming / aerobic walking)    How many days per week to you exercise?  7    How many minutes per day do you exercise?  40    Total minutes per week of exercise  280      Patient Education   Previous Diabetes Education  Yes (please comment)    Disease state   Explored patient's options for treatment of their diabetes    Nutrition management   Role of diet in the treatment of diabetes and the relationship between the three main macronutrients and blood glucose level;Food label reading, portion sizes and measuring food.;Carbohydrate counting    Physical activity and exercise   Role of exercise on diabetes management, blood pressure control and cardiac health.    Medications  Reviewed patients medication for diabetes, action, purpose, timing of dose and side effects.    Monitoring  Identified appropriate SMBG and/or A1C goals.    Acute complications  Taught treatment of hypoglycemia - the 15 rule.    Chronic complications  Relationship between chronic complications and blood glucose control;Retinopathy and reason for yearly dilated eye exams    Psychosocial adjustment  Role of stress on diabetes;Identified and addressed patients feelings and concerns about diabetes      Individualized Goals (developed by patient)   Nutrition  Follow meal plan discussed    Physical Activity  Exercise 3-5 times per week    Medications  take my medication as prescribed    Monitoring   test blood glucose pre and post meals as discussed      Post-Education Assessment   Patient understands the diabetes disease and treatment process.  Demonstrates understanding / competency    Patient understands incorporating nutritional management into lifestyle.  Demonstrates understanding / competency    Patient undertands incorporating physical activity into  lifestyle.  Demonstrates understanding / competency    Patient understands using medications safely.  Demonstrates understanding / competency    Patient understands monitoring blood glucose, interpreting and using results  Demonstrates understanding / competency    Patient understands prevention, detection, and treatment of acute complications.  Demonstrates understanding / competency    Patient understands prevention, detection, and treatment of chronic complications.  Demonstrates understanding / competency    Patient understands how to develop strategies to address psychosocial issues.  Demonstrates understanding / competency    Patient understands how to develop strategies to promote health/change behavior.  Demonstrates understanding / competency      Outcomes   Expected Outcomes  Demonstrated interest in learning. Expect positive outcomes    Future DMSE  PRN    Program Status  Completed      Individualized Plan for Diabetes Self-Management Training:   Learning Objective:  Patient will have a greater understanding of diabetes self-management. Patient education plan is to attend individual and/or group sessions per assessed needs and concerns.   Plan:   Patient Instructions  Plan:  Aim for 2 Carb Choices per meal (30 grams) +/- 1 either way  Aim for 0-1 Carbs per snack if hungry  Include protein in moderation with your meals and snacks Consider reading food labels for Total Carbohydrate of foods Continue with your activity level by power walking for 45 minutes twice daily as tolerated Continue checking BG at alternate times per day as directed by MD  Continue taking medication as directed by MD and consider asking Dr. Sharl Ma about moving Basaglar to AM to see if that helps night time low BG's  Expected Outcomes:  Demonstrated interest in learning. Expect positive outcomes  Education material provided: Living Well with Diabetes, A1C conversion sheet, Meal plan card, Snack sheet and  Carbohydrate counting sheet, Insulin Action handout  If problems or questions, patient to contact team via:  Phone  Future DSME appointment: PRN

## 2017-05-09 NOTE — Patient Instructions (Signed)
Plan:  Aim for 2 Carb Choices per meal (30 grams) +/- 1 either way  Aim for 0-1 Carbs per snack if hungry  Include protein in moderation with your meals and snacks Consider reading food labels for Total Carbohydrate of foods Continue with your activity level by power walking for 45 minutes twice daily as tolerated Continue checking BG at alternate times per day as directed by MD  Continue taking medication as directed by MD and consider asking Dr. Sharl MaKerr about moving Basaglar to AM to see if that helps night time low BG's

## 2017-07-12 ENCOUNTER — Ambulatory Visit
Admission: RE | Admit: 2017-07-12 | Discharge: 2017-07-12 | Disposition: A | Discharge status: Home / Self Care | Payer: BLUE CROSS/BLUE SHIELD | Source: Ambulatory Visit | Attending: Internal Medicine | Admitting: Internal Medicine

## 2017-07-12 DIAGNOSIS — Z1231 Encounter for screening mammogram for malignant neoplasm of breast: Secondary | ICD-10-CM

## 2017-07-23 ENCOUNTER — Other Ambulatory Visit: Payer: Self-pay | Admitting: *Deleted

## 2017-07-23 DIAGNOSIS — D509 Iron deficiency anemia, unspecified: Secondary | ICD-10-CM

## 2017-07-24 ENCOUNTER — Encounter: Payer: Self-pay | Admitting: Hematology & Oncology

## 2017-07-24 ENCOUNTER — Other Ambulatory Visit: Payer: Self-pay

## 2017-07-24 ENCOUNTER — Inpatient Hospital Stay: Payer: BLUE CROSS/BLUE SHIELD

## 2017-07-24 ENCOUNTER — Inpatient Hospital Stay: Payer: BLUE CROSS/BLUE SHIELD | Attending: Hematology & Oncology | Admitting: Hematology & Oncology

## 2017-07-24 VITALS — BP 163/71 | HR 91 | Temp 98.2°F

## 2017-07-24 VITALS — BP 146/84 | HR 77 | Temp 98.4°F | Resp 18 | Wt 204.0 lb

## 2017-07-24 DIAGNOSIS — K909 Intestinal malabsorption, unspecified: Secondary | ICD-10-CM | POA: Diagnosis not present

## 2017-07-24 DIAGNOSIS — E119 Type 2 diabetes mellitus without complications: Secondary | ICD-10-CM | POA: Diagnosis not present

## 2017-07-24 DIAGNOSIS — Z794 Long term (current) use of insulin: Secondary | ICD-10-CM | POA: Diagnosis not present

## 2017-07-24 DIAGNOSIS — R39198 Other difficulties with micturition: Secondary | ICD-10-CM | POA: Diagnosis not present

## 2017-07-24 DIAGNOSIS — D508 Other iron deficiency anemias: Secondary | ICD-10-CM | POA: Diagnosis present

## 2017-07-24 DIAGNOSIS — M791 Myalgia, unspecified site: Secondary | ICD-10-CM | POA: Diagnosis not present

## 2017-07-24 DIAGNOSIS — R0602 Shortness of breath: Secondary | ICD-10-CM | POA: Diagnosis not present

## 2017-07-24 DIAGNOSIS — R5383 Other fatigue: Secondary | ICD-10-CM

## 2017-07-24 DIAGNOSIS — D500 Iron deficiency anemia secondary to blood loss (chronic): Secondary | ICD-10-CM

## 2017-07-24 DIAGNOSIS — D509 Iron deficiency anemia, unspecified: Secondary | ICD-10-CM

## 2017-07-24 LAB — CBC WITH DIFFERENTIAL (CANCER CENTER ONLY)
Basophils Absolute: 0 10*3/uL (ref 0.0–0.1)
Basophils Relative: 0 %
Eosinophils Absolute: 0.1 10*3/uL (ref 0.0–0.5)
Eosinophils Relative: 3 %
HEMATOCRIT: 36.5 % (ref 34.8–46.6)
HEMOGLOBIN: 11.9 g/dL (ref 11.6–15.9)
LYMPHS ABS: 1.2 10*3/uL (ref 0.9–3.3)
LYMPHS PCT: 37 %
MCH: 29.3 pg (ref 26.0–34.0)
MCHC: 32.6 g/dL (ref 32.0–36.0)
MCV: 89.9 fL (ref 81.0–101.0)
Monocytes Absolute: 0.4 10*3/uL (ref 0.1–0.9)
Monocytes Relative: 11 %
NEUTROS ABS: 1.6 10*3/uL (ref 1.5–6.5)
NEUTROS PCT: 49 %
Platelet Count: 171 10*3/uL (ref 145–400)
RBC: 4.06 MIL/uL (ref 3.70–5.32)
RDW: 12.9 % (ref 11.1–15.7)
WBC: 3.2 10*3/uL — AB (ref 3.9–10.0)

## 2017-07-24 LAB — COMPREHENSIVE METABOLIC PANEL
ALK PHOS: 94 U/L (ref 40–150)
ALT: 30 U/L (ref 0–55)
AST: 31 U/L (ref 5–34)
Albumin: 3.5 g/dL (ref 3.5–5.0)
Anion gap: 10 (ref 3–11)
BUN: 29 mg/dL — ABNORMAL HIGH (ref 7–26)
CALCIUM: 9.6 mg/dL (ref 8.4–10.4)
CO2: 24 mmol/L (ref 22–29)
CREATININE: 1.4 mg/dL — AB (ref 0.60–1.10)
Chloride: 104 mmol/L (ref 98–109)
GFR, EST AFRICAN AMERICAN: 45 mL/min — AB (ref >60)
GFR, EST NON AFRICAN AMERICAN: 39 mL/min — AB (ref >60)
Glucose, Bld: 187 mg/dL — ABNORMAL HIGH (ref 70–140)
Potassium: 3.7 mmol/L (ref 3.5–5.1)
SODIUM: 138 mmol/L (ref 136–145)
Total Bilirubin: 0.5 mg/dL (ref 0.2–1.2)
Total Protein: 7.2 g/dL (ref 6.4–8.3)

## 2017-07-24 LAB — IRON AND TIBC
IRON: 96 ug/dL (ref 41–142)
SATURATION RATIOS: 30 % (ref 21–57)
TIBC: 321 ug/dL (ref 236–444)
UIBC: 226 ug/dL

## 2017-07-24 LAB — FERRITIN: FERRITIN: 12 ng/mL (ref 9–269)

## 2017-07-24 MED ORDER — FERRIC CARBOXYMALTOSE 750 MG/15ML IV SOLN
750.0000 mg | Freq: Once | INTRAVENOUS | Status: AC
  Administered 2017-07-24: 750 mg via INTRAVENOUS
  Filled 2017-07-24: qty 15

## 2017-07-24 NOTE — Progress Notes (Addendum)
Hematology and Oncology Follow Up Visit  Karen LoboMarian D Gardner [id number] [birth year] 62 y.o. 07/24/2017   Principle Diagnosis:   Iron deficiency anemia secondary to iron malabsorption  Current Therapy:    IV iron as indicated     Interim History:  Karen Gardner is back for a long weighted follow-up.  Is been over 2 years since we last saw her.  She has been doing okay until recently.  She began to feel more tired.  She gets more fatigued.  She has less stamina.  She does have diabetes.  She only takes insulin for this now.  She saw her family doctor.  He said that her iron was probably low again.  I do not see any iron studies that were done.  Lites are said that we have done her were over 2 years ago.  She is had no bleeding.  She had a colonoscopy 2 years ago.  Her mammogram was just done a month ago.  This all looked fine.  She has had no weight loss or weight gain.  She is trying to watch what she eats because of the diabetes.  She has had no rashes.  She has had no leg swelling.  She has had no fever.  Overall, her performance status is ECOG 0.  She plans on doing a lot of traveling this year.  She wants to feel better.  Medications:  Current Outpatient Medications:  .  atorvastatin (LIPITOR) 40 MG tablet, Take 40 mg by mouth at bedtime. , Disp: , Rfl:  .  CALCIUM PO, Take 1 capsule by mouth 3 (three) times daily. , Disp: , Rfl:  .  diphenhydrAMINE (BENADRYL) 25 MG tablet, Take 25 mg by mouth every 6 (six) hours as needed for allergies., Disp: , Rfl:  .  escitalopram (LEXAPRO) 10 MG tablet, Take 10 mg by mouth daily., Disp: , Rfl:  .  glucose blood (ONETOUCH VERIO) test strip, Use as instructed to check blood sugar once a day dx code E11.65, Disp: 50 each, Rfl: 2 .  insulin aspart (NOVOLOG) 100 UNIT/ML injection, Inject 0-20 Units into the skin every 4 (four) hours., Disp: 10 mL, Rfl: 11 .  lansoprazole (PREVACID) 15 MG capsule, Take 15 mg by mouth daily., Disp: , Rfl:  .   Multiple Vitamin (MULTIVITAMIN WITH MINERALS) TABS tablet, Take 1 tablet by mouth 2 (two) times daily., Disp: , Rfl:  .  ONETOUCH DELICA LANCETS FINE MISC, Use to check blood sugar once a day dx code E11.65, Disp: 100 each, Rfl: 3 .  valsartan (DIOVAN) 80 MG tablet, Take 80 mg by mouth daily., Disp: , Rfl:   Allergies:  Allergies  Allergen Reactions  . Lactose Intolerance (Gi) Diarrhea and Other (See Comments)    *Gas*   . Oatmeal Other (See Comments)    "Gas"     Past Medical History, Surgical history, Social history, and Family History were reviewed and updated.  Review of Systems: Review of Systems  Constitutional: Positive for fatigue.  HENT:  Negative.   Eyes: Negative.   Respiratory: Positive for shortness of breath.   Cardiovascular: Negative.   Gastrointestinal: Negative.   Endocrine: Negative.   Genitourinary: Positive for difficulty urinating.   Musculoskeletal: Positive for myalgias.  Skin: Negative.   Neurological: Negative.   Hematological: Negative.   Psychiatric/Behavioral: Negative.     Physical Exam:  weight is 204 lb (92.5 kg). Her oral temperature is 98.4 F (36.9 C). Her blood pressure is 146/84 (abnormal) and her  pulse is 77. Her respiration is 18 and oxygen saturation is 100%.   Wt Readings from Last 3 Encounters:  07/24/17 204 lb (92.5 kg)  06/07/16 209 lb 3.2 oz (94.9 kg)  06/06/16 211 lb (95.7 kg)    Physical Exam  Constitutional: She is oriented to person, place, and time.  HENT:  Head: Normocephalic and atraumatic.  Mouth/Throat: Oropharynx is clear and moist.  Eyes: Pupils are equal, round, and reactive to light. EOM are normal.  Neck: Normal range of motion.  Cardiovascular: Normal rate, regular rhythm and normal heart sounds.  Pulmonary/Chest: Effort normal and breath sounds normal.  Abdominal: Soft. Bowel sounds are normal.  Musculoskeletal: Normal range of motion. She exhibits no edema, tenderness or deformity.  Lymphadenopathy:     She has no cervical adenopathy.  Neurological: She is alert and oriented to person, place, and time.  Skin: Skin is warm and dry. No rash noted. No erythema.  Psychiatric: She has a normal mood and affect. Her behavior is normal. Judgment and thought content normal.  Vitals reviewed.    Lab Results  Component Value Date   WBC 3.2 (L) 07/24/2017   HGB 11.9 07/24/2017   HCT 36.5 07/24/2017   MCV 89.9 07/24/2017   PLT 171 07/24/2017     Chemistry      Component Value Date/Time   NA 140 04/28/2016 1202   NA 141 08/17/2014   K 3.6 04/28/2016 1202   CL 103 04/28/2016 1202   CO2 26 04/28/2016 1202   BUN 23 (H) 04/28/2016 1202   BUN 14 08/17/2014   CREATININE 1.07 (H) 04/28/2016 1202      Component Value Date/Time   CALCIUM 9.2 04/28/2016 1202   ALKPHOS 57 04/28/2016 1202   AST 26 04/28/2016 1202   ALT 20 04/28/2016 1202   BILITOT 1.1 04/28/2016 1202         Impression and Plan: Karen Gardner is a  62 year old African-American female with iron deficiency anemia.  We will see what her iron studies show.  We will try to give her iron today if possible.  I will plan to get her back in about 6 weeks.  Hopefully, she will be feeling better.   Josph Macho, MD 4/23/20199:20 AM

## 2017-07-24 NOTE — Patient Instructions (Signed)
Ferric carboxymaltose injection What is this medicine? FERRIC CARBOXYMALTOSE (ferr-ik car-box-ee-mol-toes) is an iron complex. Iron is used to make healthy red blood cells, which carry oxygen and nutrients throughout the body. This medicine is used to treat anemia in people with chronic kidney disease or people who cannot take iron by mouth. This medicine may be used for other purposes; ask your health care provider or pharmacist if you have questions. COMMON BRAND NAME(S): Injectafer What should I tell my health care provider before I take this medicine? They need to know if you have any of these conditions: -anemia not caused by low iron levels -high levels of iron in the blood -liver disease -an unusual or allergic reaction to iron, other medicines, foods, dyes, or preservatives -pregnant or trying to get pregnant -breast-feeding How should I use this medicine? This medicine is for infusion into a vein. It is given by a health care professional in a hospital or clinic setting. Talk to your pediatrician regarding the use of this medicine in children. Special care may be needed. Overdosage: If you think you have taken too much of this medicine contact a poison control center or emergency room at once. NOTE: This medicine is only for you. Do not share this medicine with others. What if I miss a dose? It is important not to miss your dose. Call your doctor or health care professional if you are unable to keep an appointment. What may interact with this medicine? Do not take this medicine with any of the following medications: -deferoxamine -dimercaprol -other iron products This medicine may also interact with the following medications: -chloramphenicol -deferasirox This list may not describe all possible interactions. Give your health care provider a list of all the medicines, herbs, non-prescription drugs, or dietary supplements you use. Also tell them if you smoke, drink alcohol, or use  illegal drugs. Some items may interact with your medicine. What should I watch for while using this medicine? Visit your doctor or health care professional regularly. Tell your doctor if your symptoms do not start to get better or if they get worse. You may need blood work done while you are taking this medicine. You may need to follow a special diet. Talk to your doctor. Foods that contain iron include: whole grains/cereals, dried fruits, beans, or peas, leafy green vegetables, and organ meats (liver, kidney). What side effects may I notice from receiving this medicine? Side effects that you should report to your doctor or health care professional as soon as possible: -allergic reactions like skin rash, itching or hives, swelling of the face, lips, or tongue -breathing problems -changes in blood pressure -feeling faint or lightheaded, falls -flushing, sweating, or hot feelings Side effects that usually do not require medical attention (report to your doctor or health care professional if they continue or are bothersome): -changes in taste -constipation -dizziness -headache -nausea -pain, redness, or irritation at site where injected -vomiting This list may not describe all possible side effects. Call your doctor for medical advice about side effects. You may report side effects to FDA at 1-800-FDA-1088. Where should I keep my medicine? This drug is given in a hospital or clinic and will not be stored at home. NOTE: This sheet is a summary. It may not cover all possible information. If you have questions about this medicine, talk to your doctor, pharmacist, or health care provider.  2018 Elsevier/Gold Standard (2015-04-22 11:20:47)  

## 2017-10-17 ENCOUNTER — Other Ambulatory Visit: Payer: Self-pay | Admitting: Orthopedic Surgery

## 2017-11-15 ENCOUNTER — Encounter (HOSPITAL_BASED_OUTPATIENT_CLINIC_OR_DEPARTMENT_OTHER): Admission: RE | Payer: Self-pay | Source: Ambulatory Visit

## 2017-11-15 ENCOUNTER — Ambulatory Visit (HOSPITAL_BASED_OUTPATIENT_CLINIC_OR_DEPARTMENT_OTHER)
Admission: RE | Admit: 2017-11-15 | Payer: BLUE CROSS/BLUE SHIELD | Source: Ambulatory Visit | Admitting: Orthopedic Surgery

## 2017-11-15 SURGERY — RELEASE, A1 PULLEY, FOR TRIGGER FINGER
Anesthesia: Choice | Laterality: Right

## 2018-04-19 ENCOUNTER — Emergency Department (HOSPITAL_COMMUNITY)
Admission: EM | Admit: 2018-04-19 | Discharge: 2018-04-19 | Disposition: A | Discharge status: Home / Self Care | Payer: Medicare Other | Attending: Emergency Medicine | Admitting: Emergency Medicine

## 2018-04-19 ENCOUNTER — Encounter (HOSPITAL_COMMUNITY): Payer: Self-pay | Admitting: *Deleted

## 2018-04-19 DIAGNOSIS — R739 Hyperglycemia, unspecified: Secondary | ICD-10-CM

## 2018-04-19 DIAGNOSIS — N189 Chronic kidney disease, unspecified: Secondary | ICD-10-CM | POA: Diagnosis not present

## 2018-04-19 DIAGNOSIS — E1165 Type 2 diabetes mellitus with hyperglycemia: Secondary | ICD-10-CM | POA: Insufficient documentation

## 2018-04-19 DIAGNOSIS — Z79899 Other long term (current) drug therapy: Secondary | ICD-10-CM | POA: Insufficient documentation

## 2018-04-19 DIAGNOSIS — R55 Syncope and collapse: Secondary | ICD-10-CM

## 2018-04-19 DIAGNOSIS — J45909 Unspecified asthma, uncomplicated: Secondary | ICD-10-CM | POA: Diagnosis not present

## 2018-04-19 DIAGNOSIS — Z87891 Personal history of nicotine dependence: Secondary | ICD-10-CM | POA: Insufficient documentation

## 2018-04-19 DIAGNOSIS — I129 Hypertensive chronic kidney disease with stage 1 through stage 4 chronic kidney disease, or unspecified chronic kidney disease: Secondary | ICD-10-CM | POA: Insufficient documentation

## 2018-04-19 DIAGNOSIS — Z794 Long term (current) use of insulin: Secondary | ICD-10-CM | POA: Insufficient documentation

## 2018-04-19 DIAGNOSIS — E7800 Pure hypercholesterolemia, unspecified: Secondary | ICD-10-CM | POA: Diagnosis not present

## 2018-04-19 LAB — BASIC METABOLIC PANEL
Anion gap: 10 (ref 5–15)
BUN: 21 mg/dL (ref 8–23)
CO2: 25 mmol/L (ref 22–32)
Calcium: 9.1 mg/dL (ref 8.9–10.3)
Chloride: 103 mmol/L (ref 98–111)
Creatinine, Ser: 1.23 mg/dL — ABNORMAL HIGH (ref 0.44–1.00)
GFR calc Af Amer: 54 mL/min — ABNORMAL LOW (ref >60)
GFR calc non Af Amer: 46 mL/min — ABNORMAL LOW (ref >60)
GLUCOSE: 294 mg/dL — AB (ref 70–99)
Potassium: 3.9 mmol/L (ref 3.5–5.1)
Sodium: 138 mmol/L (ref 135–145)

## 2018-04-19 LAB — CBC
HCT: 42 % (ref 36.0–46.0)
Hemoglobin: 12.8 g/dL (ref 12.0–15.0)
MCH: 28.6 pg (ref 26.0–34.0)
MCHC: 30.5 g/dL (ref 30.0–36.0)
MCV: 93.8 fL (ref 80.0–100.0)
Platelets: 161 10*3/uL (ref 150–400)
RBC: 4.48 MIL/uL (ref 3.87–5.11)
RDW: 12.9 % (ref 11.5–15.5)
WBC: 2.8 10*3/uL — ABNORMAL LOW (ref 4.0–10.5)
nRBC: 0 % (ref 0.0–0.2)

## 2018-04-19 LAB — URINALYSIS, ROUTINE W REFLEX MICROSCOPIC
BILIRUBIN URINE: NEGATIVE
Glucose, UA: >=500 mg/dL — AB
Hgb urine dipstick: NEGATIVE
Ketones, ur: 20 mg/dL — AB
Nitrite: NEGATIVE
Protein, ur: 100 mg/dL — AB
Specific Gravity, Urine: 1.02 (ref 1.005–1.030)
pH: 5 (ref 5.0–8.0)

## 2018-04-19 LAB — CBG MONITORING, ED: Glucose-Capillary: 278 mg/dL — ABNORMAL HIGH (ref 70–99)

## 2018-04-19 MED ORDER — SODIUM CHLORIDE 0.9% FLUSH
3.0000 mL | Freq: Once | INTRAVENOUS | Status: AC
  Administered 2018-04-19: 3 mL via INTRAVENOUS

## 2018-04-19 NOTE — ED Notes (Signed)
PT discharged in good condition. No s/sx distress noted. Ambulatory and left with family

## 2018-04-19 NOTE — ED Provider Notes (Signed)
[facility] EMERGENCY DEPARTMENT Provider Note   CSN: [id number] Arrival date & time: 04/19/18  1033   History   Chief Complaint Chief Complaint  Patient presents with  . Loss of Consciousness    HPI ANNALESE FANTE is a 63 y.o. female.  HPI    63 year old female presents today status post syncopal episode.  Patient notes she woke up this morning was feeling slightly fatigued and lightheaded.  She notes that she was sitting in her kitchen counter feeling slightly nauseous and woke up on the ground.  She denies any trauma from the fall.  She notes several episodes of vomiting thereafter.  Patient notes she has had similar symptoms in the past after her gastric sleeve.  She denies any preceding chest pain shortness of breath, denies any presently.  She notes she did not take her medications this morning, did not eat or drink.  She notes she is feeling close to her baseline at the time my evaluation but still feeling slightly tired.   Past Medical History:  Diagnosis Date  . Allergic rhinitis   . Anemia    has had to have iron infusions-sees dr Twanna Hy  . Anxiety   . Arthritis   . Asthma    childhood  . Chronic low back pain   . CKD (chronic kidney disease)   . Diabetes mellitus without complication (HCC)    Type 2 on insulin pump  . GERD (gastroesophageal reflux disease)   . Headache    sinus headaches   . Hypercholesterolemia   . Hypertension   . Iron deficiency anemia 05/26/2015  . 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 Menopause   . Numbness in both hands    mostly at night  . Obesity   . Shortness of breath dyspnea    with exertion  . Vitamin D deficiency     Patient Active Problem List   Diagnosis Date Noted  . S/P laparoscopic sleeve gastrectomy Dec 2017 03/14/2016  . Iron deficiency anemia 05/26/2015  . 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 Primary osteoarthritis of right knee 08/05/2014  . Primary osteoarthritis of knee 08/05/2014  . Diabetes  mellitus type 2, uncontrolled (HCC) 02/12/2014  . Essential hypertension 02/12/2014  . Ankle fracture, bimalleolar, closed 02/11/2014  . Essential hypertension, benign 01/28/2014  . Type II diabetes mellitus, uncontrolled (HCC) 01/21/2014    Past Surgical History:  Procedure Laterality Date  . COLONOSCOPY    . EYE SURGERY Bilateral    [facility]   . HERNIA REPAIR     umb hernia as child  . KNEE ARTHROSCOPY  02/12/2012   Procedure: ARTHROSCOPY KNEE;  Surgeon: Nestor Lewandowsky, MD;  Location: [facility];  Service: Orthopedics;  Laterality: Right;  Partial Lateral Meniscectomy, Debridement chondromalacia  . LAPAROSCOPIC GASTRIC SLEEVE RESECTION N/A 03/14/2016   Procedure: LAPAROSCOPIC GASTRIC SLEEVE RESECTION, WITH REPAIR OF HIATAL HERNIA REPAI, AND UPPER ENDO;  Surgeon: Luretha Murphy, MD;  Location: [facility];  Service: General;  Laterality: N/A;  . ORIF ANKLE FRACTURE Right 02/11/2014   Procedure: OPEN REDUCTION INTERNAL FIXATION (ORIF) RIGHT ANKLE FRACTURE;  Surgeon: Nestor Lewandowsky, MD;  Location: [facility];  Service: Orthopedics;  Laterality: Right;  . TOTAL KNEE ARTHROPLASTY Right 08/05/2014   Procedure: TOTAL KNEE ARTHROPLASTY;  Surgeon: Gean Birchwood, MD;  Location: [facility];  Service: Orthopedics;  Laterality: Right;  . UPPER GI ENDOSCOPY       OB History   No obstetric history on file.  Home Medications    Prior to Admission medications   Medication Sig Start Date End Date Taking? Authorizing Provider  atorvastatin (LIPITOR) 40 MG tablet Take 40 mg by mouth at bedtime.    Yes [provider]  CALCIUM PO Take 1 capsule by mouth 3 (three) times daily.    Yes [provider]  diphenhydrAMINE (BENADRYL) 25 MG tablet Take 25 mg by mouth every 6 (six) hours as needed for allergies.   Yes [provider]  escitalopram (LEXAPRO) 10 MG tablet Take 10 mg by mouth at bedtime.    Yes [provider]  Homeopathic Products (LEG CRAMPS) TABS Take 1  tablet by mouth at bedtime.   Yes [provider]  insulin lispro (HUMALOG KWIKPEN) 100 UNIT/ML KwikPen Inject 8 Units into the skin 3 (three) times daily.   Yes [provider]  lansoprazole (PREVACID) 15 MG capsule Take 15 mg by mouth at bedtime.    Yes [provider]  levocetirizine (XYZAL) 5 MG tablet Take 5 mg by mouth at bedtime as needed for allergies.   Yes [provider]  losartan (COZAAR) 100 MG tablet Take 100 mg by mouth at bedtime.   Yes [provider]  Multiple Vitamin (MULTIVITAMIN WITH MINERALS) TABS tablet Take 1 tablet by mouth 2 (two) times daily.   Yes [provider]  OVER THE COUNTER MEDICATION Take 1 tablet by mouth at bedtime. Restless leg   Yes [provider]  glucose blood (ONETOUCH VERIO) test strip Use as instructed to check blood sugar once a day dx code E11.65 02/04/14   Reather Littler, MD  insulin aspart (NOVOLOG) 100 UNIT/ML injection Inject 0-20 Units into the skin every 4 (four) hours. Patient not taking: Reported on 04/19/2018 03/16/16   Luretha Murphy, MD  [facility] DELICA LANCETS FINE MISC Use to check blood sugar once a day dx code E11.65 01/28/14   Reather Littler, MD    Family History Family History  Problem Relation Age of Onset  . Alcohol abuse Father   . Heart attack Sister 61    Social History Social History   Tobacco Use  . Smoking status: Former Smoker    Packs/day: 0.25    Years: 1.00    Pack years: 0.25    Types: Cigarettes    Last attempt to quit: 02/08/1972    Years since quitting: 46.2  . Smokeless tobacco: Never Used  Substance Use Topics  . Alcohol use: No    Alcohol/week: 0.0 standard drinks  . Drug use: No     Allergies   Lactose intolerance (gi) and Oatmeal   Review of Systems Review of Systems  All other systems reviewed and are negative.  Physical Exam Updated Vital Signs BP (!) 155/83   Pulse 70   Temp (!) 97.4 F (36.3 C) (Oral)   Resp 12   Ht 5'  5" (1.651 m)   Wt 90.7 kg   SpO2 100%   BMI 33.28 kg/m   Physical Exam Vitals signs and nursing note reviewed.  Constitutional:      Appearance: She is well-developed.  HENT:     Head: Normocephalic and atraumatic.     Comments: Head is atraumatic Eyes:     General: No scleral icterus.       Right eye: No discharge.        Left eye: No discharge.     Conjunctiva/sclera: Conjunctivae normal.     Pupils: Pupils are equal, round, and reactive to  light.  Neck:     Musculoskeletal: Normal range of motion.     Vascular: No JVD.     Trachea: No tracheal deviation.  Cardiovascular:     Rate and Rhythm: Normal rate and regular rhythm.  Pulmonary:     Effort: Pulmonary effort is normal.     Breath sounds: No stridor.  Musculoskeletal:     Comments: No CT or L-spine tenderness palpation, hip stable bilateral with AP and lateral compression bilateral lower extremities nontender to palpation  Neurological:     Mental Status: She is alert and oriented to person, place, and time.     Coordination: Coordination normal.  Psychiatric:        Behavior: Behavior normal.        Thought Content: Thought content normal.        Judgment: Judgment normal.      ED Treatments / Results  Labs (all labs ordered are listed, but only abnormal results are displayed) Labs Reviewed  BASIC METABOLIC PANEL - Abnormal; Notable for the following components:      Result Value   Glucose, Bld 294 (*)    Creatinine, Ser 1.23 (*)    GFR calc non Af Amer 46 (*)    GFR calc Af Amer 54 (*)    All other components within normal limits  CBC - Abnormal; Notable for the following components:   WBC 2.8 (*)    All other components within normal limits  URINALYSIS, ROUTINE W REFLEX MICROSCOPIC - Abnormal; Notable for the following components:   Glucose, UA >=500 (*)    Ketones, ur 20 (*)    Protein, ur 100 (*)    Leukocytes, UA TRACE (*)    Bacteria, UA RARE (*)    All other components within normal limits    CBG MONITORING, ED - Abnormal; Notable for the following components:   Glucose-Capillary 278 (*)    All other components within normal limits    EKG EKG Interpretation  Date/Time:  Friday April 19 2018 10:53:55 EST Ventricular Rate:  73 PR Interval:    QRS Duration: 86 QT Interval:  431 QTC Calculation: 475 R Axis:   34 Text Interpretation:  Sinus rhythm Anteroseptal infarct, old No acute changes No significant change since last tracing Confirmed by Derwood KaplanNanavati, Ankit [phone] on 04/19/2018 11:17:44 AM   Radiology No results found.  Procedures Procedures (including critical care time)  Medications Ordered in ED Medications  sodium chloride flush (NS) 0.9 % injection 3 mL (3 mLs Intravenous Given 04/19/18 1129)     Initial Impression / Assessment and Plan / ED Course  I have reviewed the triage vital signs and the nursing notes.  Pertinent labs & imaging results that were available during my care of the patient were reviewed by me and considered in my medical decision making (see chart for details).     Labs: CBG, BMP, CBC, UA  Imaging:  Consults:  Therapeutics:  Discharge Meds:   Assessment/Plan:  63 year old female presents today with complaints of syncopal episode.  She has no traumas from the fall, is well-appearing back to her baseline.  I personally ambulated her with no significant dizziness or ataxia.  She has no neurological deficits.  Patient does have a history of hyperglycemia and is somewhat uncontrolled, question dehydration, she did not have any food or drink this morning as well.  Patient will be encouraged to continue using home insulin, hydration, return immediately with any new or worsening signs or symptoms.  Patient had no signs of arrhythmia.  Verbalized understanding and agreement to today's plan had no further questions or concerns.  Final Clinical Impressions(s) / ED Diagnoses   Final diagnoses:  Syncope, unspecified syncope type   Hyperglycemia    ED Discharge Orders    None       Eyvonne MechanicHedges, Allex Madia, PA-C 04/19/18 1554    Derwood KaplanNanavati, Ankit, MD 04/20/18 1133

## 2018-04-19 NOTE — Discharge Instructions (Addendum)
Please read attached information. If you experience any new or worsening signs or symptoms please return to the emergency room for evaluation. Please follow-up with your primary care provider or specialist as discussed.  °

## 2018-04-19 NOTE — ED Notes (Signed)
Pt is brought in by her coworker.  Pt states that she was "feeling funny" and she states that she had a syncopal episode, when she woke up she had fallen out of the chair she was sitting in and was in a pool of greenish yellow emesis.  Pt states that she had some sob with this.  Pt is alert and oriented at this time.  Pt states that she does not think that she struck her head.

## 2018-04-30 DIAGNOSIS — M8580 Other specified disorders of bone density and structure, unspecified site: Secondary | ICD-10-CM | POA: Diagnosis not present

## 2018-04-30 DIAGNOSIS — Z794 Long term (current) use of insulin: Secondary | ICD-10-CM | POA: Diagnosis not present

## 2018-04-30 DIAGNOSIS — E11319 Type 2 diabetes mellitus with unspecified diabetic retinopathy without macular edema: Secondary | ICD-10-CM | POA: Diagnosis not present

## 2018-04-30 DIAGNOSIS — Z9884 Bariatric surgery status: Secondary | ICD-10-CM | POA: Diagnosis not present

## 2018-05-08 ENCOUNTER — Other Ambulatory Visit: Payer: Self-pay | Admitting: Orthopedic Surgery

## 2018-05-08 DIAGNOSIS — M65321 Trigger finger, right index finger: Secondary | ICD-10-CM | POA: Diagnosis not present

## 2018-05-30 ENCOUNTER — Other Ambulatory Visit (HOSPITAL_COMMUNITY)
Admission: RE | Admit: 2018-05-30 | Discharge: 2018-05-30 | Disposition: A | Discharge status: Home / Self Care | Payer: Medicare Other | Source: Ambulatory Visit | Attending: Obstetrics and Gynecology | Admitting: Obstetrics and Gynecology

## 2018-05-30 ENCOUNTER — Other Ambulatory Visit: Payer: Self-pay | Admitting: Obstetrics and Gynecology

## 2018-05-30 DIAGNOSIS — Z01411 Encounter for gynecological examination (general) (routine) with abnormal findings: Secondary | ICD-10-CM | POA: Diagnosis not present

## 2018-05-30 DIAGNOSIS — Z01419 Encounter for gynecological examination (general) (routine) without abnormal findings: Secondary | ICD-10-CM | POA: Insufficient documentation

## 2018-05-30 DIAGNOSIS — Z124 Encounter for screening for malignant neoplasm of cervix: Secondary | ICD-10-CM | POA: Diagnosis not present

## 2018-05-30 DIAGNOSIS — M8588 Other specified disorders of bone density and structure, other site: Secondary | ICD-10-CM | POA: Diagnosis not present

## 2018-06-04 LAB — CYTOLOGY - PAP
Diagnosis: NEGATIVE
HPV: NOT DETECTED

## 2018-06-27 ENCOUNTER — Encounter (HOSPITAL_BASED_OUTPATIENT_CLINIC_OR_DEPARTMENT_OTHER): Payer: Self-pay

## 2018-06-27 ENCOUNTER — Ambulatory Visit (HOSPITAL_BASED_OUTPATIENT_CLINIC_OR_DEPARTMENT_OTHER): Admit: 2018-06-27 | Payer: Medicare Other | Admitting: Orthopedic Surgery

## 2018-06-27 SURGERY — RELEASE, A1 PULLEY, FOR TRIGGER FINGER
Anesthesia: Choice | Laterality: Right

## 2018-08-02 DIAGNOSIS — M8580 Other specified disorders of bone density and structure, unspecified site: Secondary | ICD-10-CM | POA: Diagnosis not present

## 2018-08-02 DIAGNOSIS — Z794 Long term (current) use of insulin: Secondary | ICD-10-CM | POA: Diagnosis not present

## 2018-08-02 DIAGNOSIS — Z9884 Bariatric surgery status: Secondary | ICD-10-CM | POA: Diagnosis not present

## 2018-08-02 DIAGNOSIS — E11319 Type 2 diabetes mellitus with unspecified diabetic retinopathy without macular edema: Secondary | ICD-10-CM | POA: Diagnosis not present

## 2018-09-18 DIAGNOSIS — D508 Other iron deficiency anemias: Secondary | ICD-10-CM | POA: Diagnosis not present

## 2018-09-18 DIAGNOSIS — Z1239 Encounter for other screening for malignant neoplasm of breast: Secondary | ICD-10-CM | POA: Diagnosis not present

## 2018-09-18 DIAGNOSIS — Z1389 Encounter for screening for other disorder: Secondary | ICD-10-CM | POA: Diagnosis not present

## 2018-09-18 DIAGNOSIS — Z794 Long term (current) use of insulin: Secondary | ICD-10-CM | POA: Diagnosis not present

## 2018-09-18 DIAGNOSIS — Z23 Encounter for immunization: Secondary | ICD-10-CM | POA: Diagnosis not present

## 2018-09-18 DIAGNOSIS — F4321 Adjustment disorder with depressed mood: Secondary | ICD-10-CM | POA: Diagnosis not present

## 2018-09-18 DIAGNOSIS — J309 Allergic rhinitis, unspecified: Secondary | ICD-10-CM | POA: Diagnosis not present

## 2018-09-18 DIAGNOSIS — M8580 Other specified disorders of bone density and structure, unspecified site: Secondary | ICD-10-CM | POA: Diagnosis not present

## 2018-09-18 DIAGNOSIS — Z0000 Encounter for general adult medical examination without abnormal findings: Secondary | ICD-10-CM | POA: Diagnosis not present

## 2018-09-18 DIAGNOSIS — E113299 Type 2 diabetes mellitus with mild nonproliferative diabetic retinopathy without macular edema, unspecified eye: Secondary | ICD-10-CM | POA: Diagnosis not present

## 2018-09-18 DIAGNOSIS — I10 Essential (primary) hypertension: Secondary | ICD-10-CM | POA: Diagnosis not present

## 2018-10-07 ENCOUNTER — Other Ambulatory Visit: Payer: Self-pay

## 2018-10-07 ENCOUNTER — Encounter (HOSPITAL_BASED_OUTPATIENT_CLINIC_OR_DEPARTMENT_OTHER): Payer: Self-pay

## 2018-10-10 ENCOUNTER — Other Ambulatory Visit (HOSPITAL_COMMUNITY)
Admission: RE | Admit: 2018-10-10 | Discharge: 2018-10-10 | Disposition: A | Discharge status: Home / Self Care | Payer: Medicare Other | Source: Ambulatory Visit | Attending: Orthopedic Surgery | Admitting: Orthopedic Surgery

## 2018-10-10 ENCOUNTER — Other Ambulatory Visit: Payer: Self-pay | Admitting: Orthopedic Surgery

## 2018-10-10 ENCOUNTER — Encounter (HOSPITAL_BASED_OUTPATIENT_CLINIC_OR_DEPARTMENT_OTHER)
Admission: RE | Admit: 2018-10-10 | Discharge: 2018-10-10 | Disposition: A | Discharge status: Home / Self Care | Payer: Medicare Other | Source: Ambulatory Visit

## 2018-10-10 ENCOUNTER — Other Ambulatory Visit: Payer: Self-pay

## 2018-10-10 DIAGNOSIS — M65321 Trigger finger, right index finger: Secondary | ICD-10-CM | POA: Diagnosis not present

## 2018-10-10 DIAGNOSIS — J309 Allergic rhinitis, unspecified: Secondary | ICD-10-CM | POA: Diagnosis not present

## 2018-10-10 DIAGNOSIS — Z794 Long term (current) use of insulin: Secondary | ICD-10-CM | POA: Diagnosis not present

## 2018-10-10 DIAGNOSIS — E669 Obesity, unspecified: Secondary | ICD-10-CM | POA: Diagnosis not present

## 2018-10-10 DIAGNOSIS — F419 Anxiety disorder, unspecified: Secondary | ICD-10-CM | POA: Diagnosis not present

## 2018-10-10 DIAGNOSIS — Z79899 Other long term (current) drug therapy: Secondary | ICD-10-CM | POA: Diagnosis not present

## 2018-10-10 DIAGNOSIS — Z1159 Encounter for screening for other viral diseases: Secondary | ICD-10-CM | POA: Insufficient documentation

## 2018-10-10 DIAGNOSIS — I10 Essential (primary) hypertension: Secondary | ICD-10-CM | POA: Diagnosis not present

## 2018-10-10 DIAGNOSIS — E7800 Pure hypercholesterolemia, unspecified: Secondary | ICD-10-CM | POA: Diagnosis not present

## 2018-10-10 DIAGNOSIS — Z7984 Long term (current) use of oral hypoglycemic drugs: Secondary | ICD-10-CM | POA: Diagnosis not present

## 2018-10-10 DIAGNOSIS — E119 Type 2 diabetes mellitus without complications: Secondary | ICD-10-CM | POA: Diagnosis not present

## 2018-10-10 DIAGNOSIS — Z01812 Encounter for preprocedural laboratory examination: Secondary | ICD-10-CM | POA: Insufficient documentation

## 2018-10-10 DIAGNOSIS — Z96651 Presence of right artificial knee joint: Secondary | ICD-10-CM | POA: Diagnosis not present

## 2018-10-10 DIAGNOSIS — Z6834 Body mass index (BMI) 34.0-34.9, adult: Secondary | ICD-10-CM | POA: Diagnosis not present

## 2018-10-10 DIAGNOSIS — Z9641 Presence of insulin pump (external) (internal): Secondary | ICD-10-CM | POA: Diagnosis not present

## 2018-10-10 DIAGNOSIS — Z87891 Personal history of nicotine dependence: Secondary | ICD-10-CM | POA: Diagnosis not present

## 2018-10-10 LAB — BASIC METABOLIC PANEL
Anion gap: 10 (ref 5–15)
BUN: 36 mg/dL — ABNORMAL HIGH (ref 8–23)
CO2: 23 mmol/L (ref 22–32)
Calcium: 9.5 mg/dL (ref 8.9–10.3)
Chloride: 106 mmol/L (ref 98–111)
Creatinine, Ser: 1.34 mg/dL — ABNORMAL HIGH (ref 0.44–1.00)
GFR calc Af Amer: 48 mL/min — ABNORMAL LOW (ref >60)
GFR calc non Af Amer: 41 mL/min — ABNORMAL LOW (ref >60)
Glucose, Bld: 196 mg/dL — ABNORMAL HIGH (ref 70–99)
Potassium: 4.2 mmol/L (ref 3.5–5.1)
Sodium: 139 mmol/L (ref 135–145)

## 2018-10-10 NOTE — Progress Notes (Signed)
Gatorade 2 drink given with instructions to complete by 0600 dos, pt verbalized understanding. 

## 2018-10-11 LAB — SARS CORONAVIRUS 2 (TAT 6-24 HRS): SARS Coronavirus 2: NEGATIVE

## 2018-10-14 ENCOUNTER — Ambulatory Visit (HOSPITAL_BASED_OUTPATIENT_CLINIC_OR_DEPARTMENT_OTHER)
Admission: RE | Admit: 2018-10-14 | Discharge: 2018-10-14 | Disposition: A | Discharge status: Home / Self Care | Payer: Medicare Other | Attending: Orthopedic Surgery | Admitting: Orthopedic Surgery

## 2018-10-14 ENCOUNTER — Encounter (HOSPITAL_BASED_OUTPATIENT_CLINIC_OR_DEPARTMENT_OTHER): Payer: Self-pay

## 2018-10-14 ENCOUNTER — Encounter (HOSPITAL_BASED_OUTPATIENT_CLINIC_OR_DEPARTMENT_OTHER)
Admission: RE | Disposition: A | Discharge status: Home / Self Care | Payer: Self-pay | Source: Home / Self Care | Attending: Orthopedic Surgery

## 2018-10-14 ENCOUNTER — Ambulatory Visit (HOSPITAL_BASED_OUTPATIENT_CLINIC_OR_DEPARTMENT_OTHER): Payer: Medicare Other | Admitting: Certified Registered"

## 2018-10-14 ENCOUNTER — Other Ambulatory Visit: Payer: Self-pay

## 2018-10-14 DIAGNOSIS — M65321 Trigger finger, right index finger: Secondary | ICD-10-CM | POA: Insufficient documentation

## 2018-10-14 DIAGNOSIS — E669 Obesity, unspecified: Secondary | ICD-10-CM | POA: Insufficient documentation

## 2018-10-14 DIAGNOSIS — Z79899 Other long term (current) drug therapy: Secondary | ICD-10-CM | POA: Insufficient documentation

## 2018-10-14 DIAGNOSIS — Z794 Long term (current) use of insulin: Secondary | ICD-10-CM | POA: Diagnosis not present

## 2018-10-14 DIAGNOSIS — J309 Allergic rhinitis, unspecified: Secondary | ICD-10-CM | POA: Insufficient documentation

## 2018-10-14 DIAGNOSIS — I10 Essential (primary) hypertension: Secondary | ICD-10-CM | POA: Diagnosis not present

## 2018-10-14 DIAGNOSIS — Z6834 Body mass index (BMI) 34.0-34.9, adult: Secondary | ICD-10-CM | POA: Insufficient documentation

## 2018-10-14 DIAGNOSIS — F419 Anxiety disorder, unspecified: Secondary | ICD-10-CM | POA: Diagnosis not present

## 2018-10-14 DIAGNOSIS — Z87891 Personal history of nicotine dependence: Secondary | ICD-10-CM | POA: Insufficient documentation

## 2018-10-14 DIAGNOSIS — Z96651 Presence of right artificial knee joint: Secondary | ICD-10-CM | POA: Diagnosis not present

## 2018-10-14 DIAGNOSIS — Z9641 Presence of insulin pump (external) (internal): Secondary | ICD-10-CM | POA: Insufficient documentation

## 2018-10-14 DIAGNOSIS — E119 Type 2 diabetes mellitus without complications: Secondary | ICD-10-CM | POA: Diagnosis not present

## 2018-10-14 DIAGNOSIS — E7800 Pure hypercholesterolemia, unspecified: Secondary | ICD-10-CM | POA: Insufficient documentation

## 2018-10-14 DIAGNOSIS — Z7984 Long term (current) use of oral hypoglycemic drugs: Secondary | ICD-10-CM | POA: Insufficient documentation

## 2018-10-14 HISTORY — PX: TRIGGER FINGER RELEASE: SHX641

## 2018-10-14 LAB — GLUCOSE, CAPILLARY
Glucose-Capillary: 176 mg/dL — ABNORMAL HIGH (ref 70–99)
Glucose-Capillary: 137 mg/dL — ABNORMAL HIGH (ref 70–99)

## 2018-10-14 SURGERY — RELEASE, A1 PULLEY, FOR TRIGGER FINGER
Anesthesia: Regional | Site: Hand | Laterality: Right

## 2018-10-14 MED ORDER — DEXAMETHASONE SODIUM PHOSPHATE 10 MG/ML IJ SOLN
INTRAMUSCULAR | Status: AC
  Filled 2018-10-14: qty 1

## 2018-10-14 MED ORDER — FENTANYL CITRATE (PF) 100 MCG/2ML IJ SOLN
INTRAMUSCULAR | Status: DC | PRN
  Administered 2018-10-14 (×2): 50 ug via INTRAVENOUS

## 2018-10-14 MED ORDER — LACTATED RINGERS IV SOLN
INTRAVENOUS | Status: DC
  Administered 2018-10-14: 09:00:00 via INTRAVENOUS

## 2018-10-14 MED ORDER — PROPOFOL 500 MG/50ML IV EMUL
INTRAVENOUS | Status: AC
  Filled 2018-10-14: qty 50

## 2018-10-14 MED ORDER — MIDAZOLAM HCL 5 MG/5ML IJ SOLN
INTRAMUSCULAR | Status: DC | PRN
  Administered 2018-10-14: 2 mg via INTRAVENOUS

## 2018-10-14 MED ORDER — MIDAZOLAM HCL 2 MG/2ML IJ SOLN
INTRAMUSCULAR | Status: AC
  Filled 2018-10-14: qty 2

## 2018-10-14 MED ORDER — METOCLOPRAMIDE HCL 5 MG/ML IJ SOLN: 10.0000 mg | Freq: Once | INTRAMUSCULAR | Status: DC | PRN

## 2018-10-14 MED ORDER — CHLORHEXIDINE GLUCONATE 4 % EX LIQD: 60.0000 mL | Freq: Once | CUTANEOUS | Status: DC

## 2018-10-14 MED ORDER — BUPIVACAINE HCL (PF) 0.25 % IJ SOLN
INTRAMUSCULAR | Status: AC
  Filled 2018-10-14: qty 30

## 2018-10-14 MED ORDER — LIDOCAINE HCL (PF) 0.5 % IJ SOLN
INTRAMUSCULAR | Status: DC | PRN
  Administered 2018-10-14: 35 mL via INTRAVENOUS

## 2018-10-14 MED ORDER — ONDANSETRON HCL 4 MG/2ML IJ SOLN
INTRAMUSCULAR | Status: DC | PRN
  Administered 2018-10-14: 4 mg via INTRAVENOUS

## 2018-10-14 MED ORDER — HYDROCODONE-ACETAMINOPHEN 5-325 MG PO TABS: ORAL_TABLET | ORAL | 0 refills | Status: DC

## 2018-10-14 MED ORDER — MEPERIDINE HCL 25 MG/ML IJ SOLN: 6.2500 mg | INTRAMUSCULAR | Status: DC | PRN

## 2018-10-14 MED ORDER — BUPIVACAINE HCL (PF) 0.25 % IJ SOLN
INTRAMUSCULAR | Status: DC | PRN
  Administered 2018-10-14: 10 mL

## 2018-10-14 MED ORDER — CEFAZOLIN SODIUM-DEXTROSE 2-4 GM/100ML-% IV SOLN
2.0000 g | INTRAVENOUS | Status: AC
  Administered 2018-10-14: 2 g via INTRAVENOUS

## 2018-10-14 MED ORDER — PROPOFOL 500 MG/50ML IV EMUL
INTRAVENOUS | Status: DC | PRN
  Administered 2018-10-14: 75 ug/kg/min via INTRAVENOUS

## 2018-10-14 MED ORDER — CEFAZOLIN SODIUM-DEXTROSE 2-4 GM/100ML-% IV SOLN
INTRAVENOUS | Status: AC
  Filled 2018-10-14: qty 100

## 2018-10-14 MED ORDER — FENTANYL CITRATE (PF) 100 MCG/2ML IJ SOLN
INTRAMUSCULAR | Status: AC
  Filled 2018-10-14: qty 2

## 2018-10-14 MED ORDER — FENTANYL CITRATE (PF) 100 MCG/2ML IJ SOLN
25.0000 ug | INTRAMUSCULAR | Status: DC | PRN
  Administered 2018-10-14: 50 ug via INTRAVENOUS

## 2018-10-14 MED ORDER — ONDANSETRON HCL 4 MG/2ML IJ SOLN
INTRAMUSCULAR | Status: AC
  Filled 2018-10-14: qty 6

## 2018-10-14 SURGICAL SUPPLY — 32 items
BLADE SURG 15 STRL LF DISP TIS (BLADE) ×2 IMPLANT
BLADE SURG 15 STRL SS (BLADE) ×2
BNDG COHESIVE 2X5 TAN STRL LF (GAUZE/BANDAGES/DRESSINGS) ×2 IMPLANT
BNDG ESMARK 4X9 LF (GAUZE/BANDAGES/DRESSINGS) ×2 IMPLANT
CHLORAPREP W/TINT 26 (MISCELLANEOUS) ×2 IMPLANT
CORD BIPOLAR FORCEPS 12FT (ELECTRODE) ×2 IMPLANT
COVER BACK TABLE REUSABLE LG (DRAPES) ×2 IMPLANT
COVER MAYO STAND REUSABLE (DRAPES) ×2 IMPLANT
COVER WAND RF STERILE (DRAPES) IMPLANT
CUFF TOURN SGL QUICK 18X4 (TOURNIQUET CUFF) ×2 IMPLANT
DRAPE EXTREMITY T 121X128X90 (DISPOSABLE) ×2 IMPLANT
DRAPE SURG 17X23 STRL (DRAPES) ×2 IMPLANT
GAUZE SPONGE 4X4 12PLY STRL (GAUZE/BANDAGES/DRESSINGS) ×2 IMPLANT
GAUZE XEROFORM 1X8 LF (GAUZE/BANDAGES/DRESSINGS) ×2 IMPLANT
GLOVE BIO SURGEON STRL SZ 6.5 (GLOVE) ×2 IMPLANT
GLOVE BIO SURGEON STRL SZ7.5 (GLOVE) ×2 IMPLANT
GLOVE BIOGEL PI IND STRL 7.0 (GLOVE) ×2 IMPLANT
GLOVE BIOGEL PI IND STRL 8 (GLOVE) ×1 IMPLANT
GLOVE BIOGEL PI INDICATOR 7.0 (GLOVE) ×2
GLOVE BIOGEL PI INDICATOR 8 (GLOVE) ×1
GOWN STRL REUS W/ TWL LRG LVL3 (GOWN DISPOSABLE) ×1 IMPLANT
GOWN STRL REUS W/TWL LRG LVL3 (GOWN DISPOSABLE) ×1
GOWN STRL REUS W/TWL XL LVL3 (GOWN DISPOSABLE) ×2 IMPLANT
NEEDLE HYPO 25X1 1.5 SAFETY (NEEDLE) ×2 IMPLANT
NS IRRIG 1000ML POUR BTL (IV SOLUTION) ×2 IMPLANT
PACK BASIN DAY SURGERY FS (CUSTOM PROCEDURE TRAY) ×2 IMPLANT
STOCKINETTE 4X48 STRL (DRAPES) ×2 IMPLANT
SUT ETHILON 4 0 PS 2 18 (SUTURE) ×2 IMPLANT
SYR BULB 3OZ (MISCELLANEOUS) ×2 IMPLANT
SYR CONTROL 10ML LL (SYRINGE) ×2 IMPLANT
TOWEL GREEN STERILE FF (TOWEL DISPOSABLE) ×4 IMPLANT
UNDERPAD 30X30 (UNDERPADS AND DIAPERS) ×2 IMPLANT

## 2018-10-14 NOTE — Progress Notes (Signed)
Notified Dr. Marcell Barlow that pt's blood presssure is still high (182/78). Continue with discharge per Dr. Marcell Barlow.

## 2018-10-14 NOTE — Anesthesia Postprocedure Evaluation (Signed)
Anesthesia Post Note  Patient: Karen Gardner  Procedure(s) Performed: RELEASE TRIGGER FINGER/A-1 PULLEY (Right Hand)     Patient location during evaluation: PACU Anesthesia Type: Bier Block Level of consciousness: awake and alert Pain management: pain level controlled Vital Signs Assessment: post-procedure vital signs reviewed and stable Respiratory status: spontaneous breathing, nonlabored ventilation, respiratory function stable and patient connected to nasal cannula oxygen Cardiovascular status: stable and blood pressure returned to baseline Postop Assessment: no apparent nausea or vomiting Anesthetic complications: no    Last Vitals:  Vitals:   10/14/18 1200 10/14/18 1225  BP: (!) 182/88 (!) 182/78  Pulse: 62 (!) 57  Resp: 16 16  Temp:  36.8 C  SpO2: 100% 99%    Last Pain:  Vitals:   10/14/18 1225  TempSrc: Oral  PainSc: 3                  Montez Hageman

## 2018-10-14 NOTE — Progress Notes (Signed)
Discharge instructions reviewed with patient by Vickii Chafe, RN. I instructed patient to call her Dr. in charge of regulating her BP medication to notify that her BP is high for possible re-assessment and medication change. Patient verbalizes understanding.

## 2018-10-14 NOTE — H&P (Signed)
Karen Gardner is an 63 y.o. female.   Chief Complaint: right index trigger digit HPI: 63 yo female with triggering right index finger.  This has been injected without lasting resolution.  She wishes to have a trigger release.  Allergies:  Allergies  Allergen Reactions  . Lactose Intolerance (Gi) Diarrhea and Other (See Comments)    *Gas*   . Oatmeal Other (See Comments)    "Gas"     Past Medical History:  Diagnosis Date  . Allergic rhinitis   . Anemia    has had to have iron infusions-sees dr Twanna Hyenever  . Anxiety   . Arthritis   . Asthma    childhood  . Chronic low back pain   . CKD (chronic kidney disease)    pt denies  . Diabetes mellitus without complication (HCC)    Type 2 on insulin pump  . GERD (gastroesophageal reflux disease)   . Headache    sinus headaches   . Hypercholesterolemia   . Hypertension   . Iron deficiency anemia 05/26/2015  . 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 Menopause   . Numbness in both hands    mostly at night  . Obesity   . Shortness of breath dyspnea    with exertion  . Vitamin D deficiency     Past Surgical History:  Procedure Laterality Date  . COLONOSCOPY    . EYE SURGERY Bilateral    [facility]   . HERNIA REPAIR     umb hernia as child  . KNEE ARTHROSCOPY  02/12/2012   Procedure: ARTHROSCOPY KNEE;  Surgeon: Nestor LewandowskyFrank J Rowan, MD;  Location: [facility];  Service: Orthopedics;  Laterality: Right;  Partial Lateral Meniscectomy, Debridement chondromalacia  . LAPAROSCOPIC GASTRIC SLEEVE RESECTION N/A 03/14/2016   Procedure: LAPAROSCOPIC GASTRIC SLEEVE RESECTION, WITH REPAIR OF HIATAL HERNIA REPAI, AND UPPER ENDO;  Surgeon: Luretha MurphyMatthew Martin, MD;  Location: [facility];  Service: General;  Laterality: N/A;  . ORIF ANKLE FRACTURE Right 02/11/2014   Procedure: OPEN REDUCTION INTERNAL FIXATION (ORIF) RIGHT ANKLE FRACTURE;  Surgeon: Nestor LewandowskyFrank J Rowan, MD;  Location: [facility];  Service: Orthopedics;  Laterality: Right;  . TOTAL KNEE ARTHROPLASTY Right  08/05/2014   Procedure: TOTAL KNEE ARTHROPLASTY;  Surgeon: Gean BirchwoodFrank Rowan, MD;  Location: [facility];  Service: Orthopedics;  Laterality: Right;  . UPPER GI ENDOSCOPY      Family History: Family History  Problem Relation Age of Onset  . Alcohol abuse Father   . Heart attack Sister [id number]    Social History:   reports that she quit smoking about 46 years ago. Her smoking use included cigarettes. She has a 0.25 pack-year smoking history. She has never used smokeless tobacco. She reports that she does not drink alcohol or use drugs.  Medications: Medications Prior to Admission  Medication Sig Dispense Refill  . atorvastatin (LIPITOR) 40 MG tablet Take 40 mg by mouth at bedtime.     Marland Kitchen. CALCIUM PO Take 1 capsule by mouth 3 (three) times daily.     Marland Kitchen. escitalopram (LEXAPRO) 10 MG tablet Take 10 mg by mouth at bedtime.     . Homeopathic Products (LEG CRAMPS) TABS Take 1 tablet by mouth at bedtime.    . insulin lispro (HUMALOG KWIKPEN) 100 UNIT/ML KwikPen Inject 8 Units into the skin 3 (three) times daily.    . lansoprazole (PREVACID) 15 MG capsule Take 15 mg by mouth at bedtime.     Marland Kitchen. levocetirizine (XYZAL) 5 MG tablet Take 5 mg by mouth  at bedtime as needed for allergies.    Marland Kitchen losartan (COZAAR) 100 MG tablet Take 100 mg by mouth at bedtime.    . metFORMIN (GLUCOPHAGE) 500 MG tablet Take by mouth daily with breakfast.    . Multiple Vitamin (MULTIVITAMIN WITH MINERALS) TABS tablet Take 1 tablet by mouth 2 (two) times daily.    Marland Kitchen OVER THE COUNTER MEDICATION Take 1 tablet by mouth at bedtime. Restless leg    . diphenhydrAMINE (BENADRYL) 25 MG tablet Take 25 mg by mouth every 6 (six) hours as needed for allergies.    Marland Kitchen glucose blood (ONETOUCH VERIO) test strip Use as instructed to check blood sugar once a day dx code E11.65 50 each 2  . ONETOUCH DELICA LANCETS FINE MISC Use to check blood sugar once a day dx code E11.65 100 each 3    Results for orders placed or performed during the hospital encounter of  10/14/18 (from the past 48 hour(s))  Glucose, capillary     Status: Abnormal   Collection Time: 10/14/18  8:28 AM  Result Value Ref Range   Glucose-Capillary 176 (H) 70 - 99 mg/dL    No results found.   A comprehensive review of systems was negative.  Blood pressure 123/78, pulse 87, temperature (!) 97.1 F (36.2 C), temperature source Oral, resp. rate 18, height 5' 5.5" (1.664 m), weight 95.3 kg, SpO2 100 %.  General appearance: alert, cooperative and appears stated age Head: Normocephalic, without obvious abnormality, atraumatic Neck: supple, symmetrical, trachea midline Cardio: regular rate and rhythm Resp: clear to auscultation bilaterally Extremities: Intact sensation and capillary refill all digits.  +epl/fpl/io.  No wounds.  Pulses: 2+ and symmetric Skin: Skin color, texture, turgor normal. No rashes or lesions Neurologic: Grossly normal Incision/Wound: none  Assessment/Plan Right index finger trigger digit.  Non operative and operative treatment options have been discussed with the patient and patient wishes to proceed with operative treatment. Risks, benefits, and alternatives of surgery have been discussed and the patient agrees with the plan of care.   Karen Gardner 10/14/2018, 10:54 AM

## 2018-10-14 NOTE — Discharge Instructions (Addendum)
No Zofran before 5pm today  [facility] Instructions Hand Surgery  Wound Care: Keep your hand elevated above the level of your heart.  Do not allow it to dangle by your side.  Keep the dressing dry and do not remove it unless your doctor advises you to do so.  He will usually change it at the time of your post-op visit.  Moving your fingers is advised to stimulate circulation but will depend on the site of your surgery.  If you have a splint applied, your doctor will advise you regarding movement.  Activity: Do not drive or operate machinery today.  Rest today and then you may return to your normal activity and work as indicated by your physician.  Diet:  Drink liquids today or eat a light diet.  You may resume a regular diet tomorrow.    General expectations: Pain for two to three days. Fingers may become slightly swollen.  Call your doctor if any of the following occur: Severe pain not relieved by pain medication. Elevated temperature. Dressing soaked with blood. Inability to move fingers. White or bluish color to fingers.   Post Anesthesia Home Care Instructions  Activity: Get plenty of rest for the remainder of the day. A responsible individual must stay with you for 24 hours following the procedure.  For the next 24 hours, DO NOT: -Drive a car -Paediatric nurse -Drink alcoholic beverages -Take any medication unless instructed by your physician -Make any legal decisions or sign important papers.  Meals: Start with liquid foods such as gelatin or soup. Progress to regular foods as tolerated. Avoid greasy, spicy, heavy foods. If nausea and/or vomiting occur, drink only clear liquids until the nausea and/or vomiting subsides. Call your physician if vomiting continues.  Special Instructions/Symptoms: Your throat may feel dry or sore from the anesthesia or the breathing tube placed in your throat during surgery. If this causes discomfort, gargle with warm salt water. The  discomfort should disappear within 24 hours.  If you had a scopolamine patch placed behind your ear for the management of post- operative nausea and/or vomiting:  1. The medication in the patch is effective for 72 hours, after which it should be removed.  Wrap patch in a tissue and discard in the trash. Wash hands thoroughly with soap and water. 2. You may remove the patch earlier than 72 hours if you experience unpleasant side effects which may include dry mouth, dizziness or visual disturbances. 3. Avoid touching the patch. Wash your hands with soap and water after contact with the patch.

## 2018-10-14 NOTE — Anesthesia Preprocedure Evaluation (Signed)
Anesthesia Evaluation  Patient identified by MRN, date of birth, ID band Patient awake    Reviewed: Allergy & Precautions, NPO status , Patient's Chart, lab work & pertinent test results  Airway Mallampati: II  TM Distance: >3 FB Neck ROM: Full    Dental no notable dental hx.    Pulmonary asthma , former smoker,    Pulmonary exam normal breath sounds clear to auscultation       Cardiovascular hypertension, Pt. on medications Normal cardiovascular exam Rhythm:Regular Rate:Normal     Neuro/Psych negative neurological ROS  negative psych ROS   GI/Hepatic negative GI ROS, Neg liver ROS,   Endo/Other  diabetes, Well Controlled, Type 2, Oral Hypoglycemic Agents, Insulin Dependent  Renal/GU negative Renal ROS  negative genitourinary   Musculoskeletal negative musculoskeletal ROS (+)   Abdominal   Peds negative pediatric ROS (+)  Hematology negative hematology ROS (+)   Anesthesia Other Findings   Reproductive/Obstetrics negative OB ROS                             Anesthesia Physical Anesthesia Plan  ASA: II  Anesthesia Plan: Bier Block and Bier Block-LIDOCAINE ONLY   Post-op Pain Management:    Induction:   PONV Risk Score and Plan: 2 and Treatment may vary due to age or medical condition  Airway Management Planned: Simple Face Mask and Nasal Cannula  Additional Equipment:   Intra-op Plan:   Post-operative Plan:   Informed Consent: I have reviewed the patients History and Physical, chart, labs and discussed the procedure including the risks, benefits and alternatives for the proposed anesthesia with the patient or authorized representative who has indicated his/her understanding and acceptance.     Dental advisory given  Plan Discussed with:   Anesthesia Plan Comments:         Anesthesia Quick Evaluation

## 2018-10-14 NOTE — Transfer of Care (Signed)
Immediate Anesthesia Transfer of Care Note  Patient: Karen Gardner  Procedure(s) Performed: RELEASE TRIGGER FINGER/A-1 PULLEY (Right Hand)  Patient Location: PACU  Anesthesia Type:MAC and Regional  Level of Consciousness: awake, alert  and oriented  Airway & Oxygen Therapy: Patient Spontanous Breathing and Patient connected to face mask oxygen  Post-op Assessment: Report given to RN and Post -op Vital signs reviewed and stable  Post vital signs: Reviewed and stable  Last Vitals:  Vitals Value Taken Time  BP    Temp    Pulse 130 10/14/18 1127  Resp    SpO2 87 % 10/14/18 1127  Vitals shown include unvalidated device data.  Last Pain:  Vitals:   10/14/18 0827  TempSrc: Oral  PainSc: 5       Patients Stated Pain Goal: 3 (70/35/00 9381)  Complications: No apparent anesthesia complications

## 2018-10-14 NOTE — Op Note (Signed)
10/14/2018 [facility]  Operative Note  PREOPERATIVE DIAGNOSIS: TRIGGER RIGHT INDEX FINGER  POSTOPERATIVE DIAGNOSIS:  TRIGGER RIGHT INDEX FINGER  PROCEDURE: Procedure(s): RELEASE TRIGGER FINGER/A-1 PULLEY  SURGEON:  Leanora Cover, MD  ASSISTANT:  none.  ANESTHESIA:  Bier block with sedation.  IV FLUIDS:  Per anesthesia flow sheet.  ESTIMATED BLOOD LOSS:  Minimal.  COMPLICATIONS:  None.  SPECIMENS:  None.  TOURNIQUET TIME:  Total Tourniquet Time Documented: Forearm (Right) - 18 minutes Total: Forearm (Right) - 18 minutes   DISPOSITION:  Stable to PACU.  LOCATION: [facility]  INDICATIONS: Karen Gardner is a 63 y.o. female with triggering right index finger.  This has been injected without lasting resolution.  She wishes to have a trigger release.  Risks, benefits and alternatives of surgery were discussed including the risk of blood loss, infection, damage to nerves, vessels, tendons, ligaments, bone, failure of surgery, need for additional surgery, complications with wound healing, continued pain, continued triggering and need for repeat surgery.  She voiced understanding of these risks and elected to proceed.  OPERATIVE COURSE:  After being identified preoperatively by myself, the patient and I agreed upon the procedure and site of procedure.  The surgical site was marked. The risks, benefits, and alternatives of surgery were reviewed and she wished to proceed.  Surgical consent had been signed. She was given IV Ancef as preoperative antibiotic prophylaxis. She was transported to the operating room and placed on the operating room table in supine position with the Right upper extremity on an arm board. Bier block anesthesia was induced by the anesthesiologist.  The Right upper extremity was prepped and draped in normal sterile orthopedic fashion. A surgical pause was performed between surgeons, anesthesia, and operating room staff, and all were in  agreement as to the patient, procedure, and site of procedure.  Tourniquet at the proximal aspect of the forearm had been inflated for the Bier block.  An incision was made at the volar aspect of the MP joint of the index finger.  This was carried into the subcutaneous tissues by preading technique.  Bipolar electrocautery was used to obtain hemostasis.  The radial and ulnar digital nerves were protected throughout the case. The flexor sheath was identified.  The A1 pulley was identified and sharply incised.  It was released in its entirety.  The proximal 1-2 mm of the A2 pulley was vented to allow better excursion of the tendons.  The finger was placed through a range of motion and there was noted to be no catching.  The tendons were brought through the wound and any adherences released.  The wound was then copiously irrigated with sterile saline. It was closed with 4-0 nylon in a horizontal mattress fashion.  It was injected with 0.25% plain Marcaine to aid in postoperative analgesia.  It was dressed with sterile Xeroform, 4x4s, and wrapped lightly with a Coban dressing.  Tourniquet was deflated at 18 minutes.  The fingertips were pink with brisk capillary refill after deflation of the tourniquet.  The operative drapes were broken down and the patient was awoken from anesthesia safely.  She was transferred back to the stretcher and taken to the PACU in stable condition.   I will see her back in the office in 1 week for postoperative followup.  I will give her a prescription for Norco 5/325 1-2 tabs PO q6 hours prn pain, dispense # 15.    Leanora Cover, MD Electronically signed, 10/14/18

## 2018-10-15 ENCOUNTER — Encounter (HOSPITAL_BASED_OUTPATIENT_CLINIC_OR_DEPARTMENT_OTHER): Payer: Self-pay | Admitting: Orthopedic Surgery

## 2018-10-18 DIAGNOSIS — E113592 Type 2 diabetes mellitus with proliferative diabetic retinopathy without macular edema, left eye: Secondary | ICD-10-CM | POA: Diagnosis not present

## 2018-11-05 DIAGNOSIS — M8580 Other specified disorders of bone density and structure, unspecified site: Secondary | ICD-10-CM | POA: Diagnosis not present

## 2018-11-05 DIAGNOSIS — Z794 Long term (current) use of insulin: Secondary | ICD-10-CM | POA: Diagnosis not present

## 2018-11-05 DIAGNOSIS — E11319 Type 2 diabetes mellitus with unspecified diabetic retinopathy without macular edema: Secondary | ICD-10-CM | POA: Diagnosis not present

## 2018-11-05 DIAGNOSIS — Z9884 Bariatric surgery status: Secondary | ICD-10-CM | POA: Diagnosis not present

## 2018-12-13 ENCOUNTER — Other Ambulatory Visit: Payer: Self-pay | Admitting: Internal Medicine

## 2018-12-13 DIAGNOSIS — Z1231 Encounter for screening mammogram for malignant neoplasm of breast: Secondary | ICD-10-CM

## 2018-12-17 ENCOUNTER — Ambulatory Visit
Admission: RE | Admit: 2018-12-17 | Discharge: 2018-12-17 | Disposition: A | Discharge status: Home / Self Care | Payer: Medicare Other | Source: Ambulatory Visit | Attending: Internal Medicine | Admitting: Internal Medicine

## 2018-12-17 ENCOUNTER — Other Ambulatory Visit: Payer: Self-pay

## 2018-12-17 DIAGNOSIS — Z1231 Encounter for screening mammogram for malignant neoplasm of breast: Secondary | ICD-10-CM

## 2018-12-20 DIAGNOSIS — Z23 Encounter for immunization: Secondary | ICD-10-CM | POA: Diagnosis not present

## 2018-12-20 DIAGNOSIS — E1165 Type 2 diabetes mellitus with hyperglycemia: Secondary | ICD-10-CM | POA: Diagnosis not present

## 2018-12-20 DIAGNOSIS — Z794 Long term (current) use of insulin: Secondary | ICD-10-CM | POA: Diagnosis not present

## 2018-12-20 DIAGNOSIS — I10 Essential (primary) hypertension: Secondary | ICD-10-CM | POA: Diagnosis not present

## 2018-12-20 DIAGNOSIS — J309 Allergic rhinitis, unspecified: Secondary | ICD-10-CM | POA: Diagnosis not present

## 2018-12-20 DIAGNOSIS — Z9884 Bariatric surgery status: Secondary | ICD-10-CM | POA: Diagnosis not present

## 2018-12-20 DIAGNOSIS — F419 Anxiety disorder, unspecified: Secondary | ICD-10-CM | POA: Diagnosis not present

## 2018-12-27 ENCOUNTER — Encounter (HOSPITAL_COMMUNITY): Payer: Self-pay

## 2019-01-03 ENCOUNTER — Other Ambulatory Visit: Payer: Self-pay

## 2019-01-03 DIAGNOSIS — Z20822 Contact with and (suspected) exposure to covid-19: Secondary | ICD-10-CM

## 2019-01-04 LAB — NOVEL CORONAVIRUS, NAA: SARS-CoV-2, NAA: NOT DETECTED

## 2019-02-07 DIAGNOSIS — Z9884 Bariatric surgery status: Secondary | ICD-10-CM | POA: Diagnosis not present

## 2019-02-13 DIAGNOSIS — F419 Anxiety disorder, unspecified: Secondary | ICD-10-CM | POA: Diagnosis not present

## 2019-02-13 DIAGNOSIS — Z794 Long term (current) use of insulin: Secondary | ICD-10-CM | POA: Diagnosis not present

## 2019-02-13 DIAGNOSIS — Z9884 Bariatric surgery status: Secondary | ICD-10-CM | POA: Diagnosis not present

## 2019-02-13 DIAGNOSIS — G4700 Insomnia, unspecified: Secondary | ICD-10-CM | POA: Diagnosis not present

## 2019-02-13 DIAGNOSIS — E11319 Type 2 diabetes mellitus with unspecified diabetic retinopathy without macular edema: Secondary | ICD-10-CM | POA: Diagnosis not present

## 2019-02-13 DIAGNOSIS — Z79899 Other long term (current) drug therapy: Secondary | ICD-10-CM | POA: Diagnosis not present

## 2019-02-13 DIAGNOSIS — M810 Age-related osteoporosis without current pathological fracture: Secondary | ICD-10-CM | POA: Diagnosis not present

## 2019-04-22 DIAGNOSIS — H40013 Open angle with borderline findings, low risk, bilateral: Secondary | ICD-10-CM | POA: Diagnosis not present

## 2019-05-20 DIAGNOSIS — E11319 Type 2 diabetes mellitus with unspecified diabetic retinopathy without macular edema: Secondary | ICD-10-CM | POA: Diagnosis not present

## 2019-05-20 DIAGNOSIS — M8580 Other specified disorders of bone density and structure, unspecified site: Secondary | ICD-10-CM | POA: Diagnosis not present

## 2019-05-20 DIAGNOSIS — Z5181 Encounter for therapeutic drug level monitoring: Secondary | ICD-10-CM | POA: Diagnosis not present

## 2019-05-20 DIAGNOSIS — Z794 Long term (current) use of insulin: Secondary | ICD-10-CM | POA: Diagnosis not present

## 2019-05-20 DIAGNOSIS — Z9884 Bariatric surgery status: Secondary | ICD-10-CM | POA: Diagnosis not present

## 2019-07-01 DIAGNOSIS — M7061 Trochanteric bursitis, right hip: Secondary | ICD-10-CM | POA: Diagnosis not present

## 2019-07-01 DIAGNOSIS — M47816 Spondylosis without myelopathy or radiculopathy, lumbar region: Secondary | ICD-10-CM | POA: Diagnosis not present

## 2019-07-09 DIAGNOSIS — M7060 Trochanteric bursitis, unspecified hip: Secondary | ICD-10-CM | POA: Diagnosis not present

## 2019-07-09 DIAGNOSIS — R2689 Other abnormalities of gait and mobility: Secondary | ICD-10-CM | POA: Diagnosis not present

## 2019-07-09 DIAGNOSIS — M6281 Muscle weakness (generalized): Secondary | ICD-10-CM | POA: Diagnosis not present

## 2019-07-09 DIAGNOSIS — M4316 Spondylolisthesis, lumbar region: Secondary | ICD-10-CM | POA: Diagnosis not present

## 2019-07-15 DIAGNOSIS — M67441 Ganglion, right hand: Secondary | ICD-10-CM | POA: Diagnosis not present

## 2019-07-15 DIAGNOSIS — M65321 Trigger finger, right index finger: Secondary | ICD-10-CM | POA: Diagnosis not present

## 2019-07-16 ENCOUNTER — Other Ambulatory Visit: Payer: Self-pay | Admitting: Orthopedic Surgery

## 2019-07-16 DIAGNOSIS — M7060 Trochanteric bursitis, unspecified hip: Secondary | ICD-10-CM | POA: Diagnosis not present

## 2019-07-16 DIAGNOSIS — M4316 Spondylolisthesis, lumbar region: Secondary | ICD-10-CM | POA: Diagnosis not present

## 2019-07-16 DIAGNOSIS — M6281 Muscle weakness (generalized): Secondary | ICD-10-CM | POA: Diagnosis not present

## 2019-07-16 DIAGNOSIS — R2689 Other abnormalities of gait and mobility: Secondary | ICD-10-CM | POA: Diagnosis not present

## 2019-07-16 DIAGNOSIS — M67441 Ganglion, right hand: Secondary | ICD-10-CM

## 2019-07-18 DIAGNOSIS — R2689 Other abnormalities of gait and mobility: Secondary | ICD-10-CM | POA: Diagnosis not present

## 2019-07-18 DIAGNOSIS — M7060 Trochanteric bursitis, unspecified hip: Secondary | ICD-10-CM | POA: Diagnosis not present

## 2019-07-18 DIAGNOSIS — M4316 Spondylolisthesis, lumbar region: Secondary | ICD-10-CM | POA: Diagnosis not present

## 2019-07-18 DIAGNOSIS — M6281 Muscle weakness (generalized): Secondary | ICD-10-CM | POA: Diagnosis not present

## 2019-07-21 ENCOUNTER — Ambulatory Visit
Admission: RE | Admit: 2019-07-21 | Discharge: 2019-07-21 | Disposition: A | Discharge status: Home / Self Care | Payer: Medicare Other | Source: Ambulatory Visit | Attending: Orthopedic Surgery | Admitting: Orthopedic Surgery

## 2019-07-21 ENCOUNTER — Other Ambulatory Visit: Payer: Self-pay

## 2019-07-21 DIAGNOSIS — M67441 Ganglion, right hand: Secondary | ICD-10-CM

## 2019-07-25 DIAGNOSIS — M4316 Spondylolisthesis, lumbar region: Secondary | ICD-10-CM | POA: Diagnosis not present

## 2019-07-25 DIAGNOSIS — M7060 Trochanteric bursitis, unspecified hip: Secondary | ICD-10-CM | POA: Diagnosis not present

## 2019-07-25 DIAGNOSIS — M6281 Muscle weakness (generalized): Secondary | ICD-10-CM | POA: Diagnosis not present

## 2019-07-25 DIAGNOSIS — R2689 Other abnormalities of gait and mobility: Secondary | ICD-10-CM | POA: Diagnosis not present

## 2019-07-29 DIAGNOSIS — M65321 Trigger finger, right index finger: Secondary | ICD-10-CM | POA: Diagnosis not present

## 2019-07-30 DIAGNOSIS — M4316 Spondylolisthesis, lumbar region: Secondary | ICD-10-CM | POA: Diagnosis not present

## 2019-07-30 DIAGNOSIS — M7060 Trochanteric bursitis, unspecified hip: Secondary | ICD-10-CM | POA: Diagnosis not present

## 2019-07-30 DIAGNOSIS — R2689 Other abnormalities of gait and mobility: Secondary | ICD-10-CM | POA: Diagnosis not present

## 2019-07-30 DIAGNOSIS — M6281 Muscle weakness (generalized): Secondary | ICD-10-CM | POA: Diagnosis not present

## 2019-08-01 DIAGNOSIS — R2689 Other abnormalities of gait and mobility: Secondary | ICD-10-CM | POA: Diagnosis not present

## 2019-08-01 DIAGNOSIS — M6281 Muscle weakness (generalized): Secondary | ICD-10-CM | POA: Diagnosis not present

## 2019-08-01 DIAGNOSIS — M4316 Spondylolisthesis, lumbar region: Secondary | ICD-10-CM | POA: Diagnosis not present

## 2019-08-01 DIAGNOSIS — M7060 Trochanteric bursitis, unspecified hip: Secondary | ICD-10-CM | POA: Diagnosis not present

## 2019-08-05 DIAGNOSIS — E559 Vitamin D deficiency, unspecified: Secondary | ICD-10-CM | POA: Diagnosis not present

## 2019-08-05 DIAGNOSIS — E1122 Type 2 diabetes mellitus with diabetic chronic kidney disease: Secondary | ICD-10-CM | POA: Diagnosis not present

## 2019-08-05 DIAGNOSIS — R635 Abnormal weight gain: Secondary | ICD-10-CM | POA: Diagnosis not present

## 2019-08-05 DIAGNOSIS — N951 Menopausal and female climacteric states: Secondary | ICD-10-CM | POA: Diagnosis not present

## 2019-08-06 DIAGNOSIS — R2689 Other abnormalities of gait and mobility: Secondary | ICD-10-CM | POA: Diagnosis not present

## 2019-08-06 DIAGNOSIS — M6281 Muscle weakness (generalized): Secondary | ICD-10-CM | POA: Diagnosis not present

## 2019-08-06 DIAGNOSIS — M7060 Trochanteric bursitis, unspecified hip: Secondary | ICD-10-CM | POA: Diagnosis not present

## 2019-08-06 DIAGNOSIS — M4316 Spondylolisthesis, lumbar region: Secondary | ICD-10-CM | POA: Diagnosis not present

## 2019-08-07 DIAGNOSIS — M65321 Trigger finger, right index finger: Secondary | ICD-10-CM | POA: Diagnosis not present

## 2019-08-07 DIAGNOSIS — M25641 Stiffness of right hand, not elsewhere classified: Secondary | ICD-10-CM | POA: Diagnosis not present

## 2019-08-07 DIAGNOSIS — M79644 Pain in right finger(s): Secondary | ICD-10-CM | POA: Diagnosis not present

## 2019-08-08 DIAGNOSIS — M6281 Muscle weakness (generalized): Secondary | ICD-10-CM | POA: Diagnosis not present

## 2019-08-08 DIAGNOSIS — R2689 Other abnormalities of gait and mobility: Secondary | ICD-10-CM | POA: Diagnosis not present

## 2019-08-08 DIAGNOSIS — M7060 Trochanteric bursitis, unspecified hip: Secondary | ICD-10-CM | POA: Diagnosis not present

## 2019-08-08 DIAGNOSIS — M4316 Spondylolisthesis, lumbar region: Secondary | ICD-10-CM | POA: Diagnosis not present

## 2019-08-14 DIAGNOSIS — M79644 Pain in right finger(s): Secondary | ICD-10-CM | POA: Diagnosis not present

## 2019-08-14 DIAGNOSIS — M25641 Stiffness of right hand, not elsewhere classified: Secondary | ICD-10-CM | POA: Diagnosis not present

## 2019-08-14 DIAGNOSIS — M65321 Trigger finger, right index finger: Secondary | ICD-10-CM | POA: Diagnosis not present

## 2019-08-21 DIAGNOSIS — M4316 Spondylolisthesis, lumbar region: Secondary | ICD-10-CM | POA: Diagnosis not present

## 2019-08-21 DIAGNOSIS — M8580 Other specified disorders of bone density and structure, unspecified site: Secondary | ICD-10-CM | POA: Diagnosis not present

## 2019-08-21 DIAGNOSIS — Z794 Long term (current) use of insulin: Secondary | ICD-10-CM | POA: Diagnosis not present

## 2019-08-21 DIAGNOSIS — M6281 Muscle weakness (generalized): Secondary | ICD-10-CM | POA: Diagnosis not present

## 2019-08-21 DIAGNOSIS — E113593 Type 2 diabetes mellitus with proliferative diabetic retinopathy without macular edema, bilateral: Secondary | ICD-10-CM | POA: Diagnosis not present

## 2019-08-21 DIAGNOSIS — Z9884 Bariatric surgery status: Secondary | ICD-10-CM | POA: Diagnosis not present

## 2019-08-21 DIAGNOSIS — M7060 Trochanteric bursitis, unspecified hip: Secondary | ICD-10-CM | POA: Diagnosis not present

## 2019-08-21 DIAGNOSIS — R2689 Other abnormalities of gait and mobility: Secondary | ICD-10-CM | POA: Diagnosis not present

## 2019-08-21 DIAGNOSIS — Z5181 Encounter for therapeutic drug level monitoring: Secondary | ICD-10-CM | POA: Diagnosis not present

## 2019-08-23 DIAGNOSIS — H1089 Other conjunctivitis: Secondary | ICD-10-CM | POA: Diagnosis not present

## 2019-08-23 DIAGNOSIS — H1033 Unspecified acute conjunctivitis, bilateral: Secondary | ICD-10-CM | POA: Diagnosis not present

## 2019-08-23 DIAGNOSIS — J301 Allergic rhinitis due to pollen: Secondary | ICD-10-CM | POA: Diagnosis not present

## 2019-08-26 DIAGNOSIS — E1122 Type 2 diabetes mellitus with diabetic chronic kidney disease: Secondary | ICD-10-CM | POA: Diagnosis not present

## 2019-08-26 DIAGNOSIS — N951 Menopausal and female climacteric states: Secondary | ICD-10-CM | POA: Diagnosis not present

## 2019-08-26 DIAGNOSIS — Z794 Long term (current) use of insulin: Secondary | ICD-10-CM | POA: Diagnosis not present

## 2019-08-26 DIAGNOSIS — E559 Vitamin D deficiency, unspecified: Secondary | ICD-10-CM | POA: Diagnosis not present

## 2019-08-26 DIAGNOSIS — Z1331 Encounter for screening for depression: Secondary | ICD-10-CM | POA: Diagnosis not present

## 2019-08-26 DIAGNOSIS — Z1339 Encounter for screening examination for other mental health and behavioral disorders: Secondary | ICD-10-CM | POA: Diagnosis not present

## 2019-08-26 DIAGNOSIS — N183 Chronic kidney disease, stage 3 unspecified: Secondary | ICD-10-CM | POA: Diagnosis not present

## 2019-08-26 DIAGNOSIS — G479 Sleep disorder, unspecified: Secondary | ICD-10-CM | POA: Diagnosis not present

## 2019-08-26 DIAGNOSIS — M2559 Pain in other specified joint: Secondary | ICD-10-CM | POA: Diagnosis not present

## 2019-08-26 DIAGNOSIS — Z6833 Body mass index (BMI) 33.0-33.9, adult: Secondary | ICD-10-CM | POA: Diagnosis not present

## 2019-08-27 DIAGNOSIS — H40013 Open angle with borderline findings, low risk, bilateral: Secondary | ICD-10-CM | POA: Diagnosis not present

## 2019-08-29 DIAGNOSIS — M47816 Spondylosis without myelopathy or radiculopathy, lumbar region: Secondary | ICD-10-CM | POA: Diagnosis not present

## 2019-10-29 DIAGNOSIS — M47816 Spondylosis without myelopathy or radiculopathy, lumbar region: Secondary | ICD-10-CM | POA: Diagnosis not present

## 2019-11-21 DIAGNOSIS — E113593 Type 2 diabetes mellitus with proliferative diabetic retinopathy without macular edema, bilateral: Secondary | ICD-10-CM | POA: Diagnosis not present

## 2019-11-21 DIAGNOSIS — M8580 Other specified disorders of bone density and structure, unspecified site: Secondary | ICD-10-CM | POA: Diagnosis not present

## 2019-11-21 DIAGNOSIS — Z9884 Bariatric surgery status: Secondary | ICD-10-CM | POA: Diagnosis not present

## 2019-11-21 DIAGNOSIS — Z794 Long term (current) use of insulin: Secondary | ICD-10-CM | POA: Diagnosis not present

## 2019-12-05 ENCOUNTER — Other Ambulatory Visit: Payer: Medicare Other

## 2019-12-05 ENCOUNTER — Other Ambulatory Visit: Payer: Self-pay

## 2019-12-05 DIAGNOSIS — Z20822 Contact with and (suspected) exposure to covid-19: Secondary | ICD-10-CM | POA: Diagnosis not present

## 2019-12-08 LAB — NOVEL CORONAVIRUS, NAA

## 2019-12-09 ENCOUNTER — Ambulatory Visit: Payer: Medicare Other

## 2019-12-10 DIAGNOSIS — H5213 Myopia, bilateral: Secondary | ICD-10-CM | POA: Diagnosis not present

## 2019-12-10 DIAGNOSIS — E113593 Type 2 diabetes mellitus with proliferative diabetic retinopathy without macular edema, bilateral: Secondary | ICD-10-CM | POA: Diagnosis not present

## 2019-12-11 DIAGNOSIS — H4312 Vitreous hemorrhage, left eye: Secondary | ICD-10-CM | POA: Diagnosis not present

## 2019-12-11 DIAGNOSIS — H3582 Retinal ischemia: Secondary | ICD-10-CM | POA: Diagnosis not present

## 2019-12-11 DIAGNOSIS — E113593 Type 2 diabetes mellitus with proliferative diabetic retinopathy without macular edema, bilateral: Secondary | ICD-10-CM | POA: Diagnosis not present

## 2019-12-11 DIAGNOSIS — H43822 Vitreomacular adhesion, left eye: Secondary | ICD-10-CM | POA: Diagnosis not present

## 2019-12-22 DIAGNOSIS — M47816 Spondylosis without myelopathy or radiculopathy, lumbar region: Secondary | ICD-10-CM | POA: Diagnosis not present

## 2019-12-23 ENCOUNTER — Other Ambulatory Visit: Payer: Self-pay

## 2019-12-29 ENCOUNTER — Other Ambulatory Visit: Payer: Self-pay | Admitting: Internal Medicine

## 2019-12-29 DIAGNOSIS — Z1231 Encounter for screening mammogram for malignant neoplasm of breast: Secondary | ICD-10-CM

## 2019-12-31 DIAGNOSIS — Z23 Encounter for immunization: Secondary | ICD-10-CM | POA: Diagnosis not present

## 2020-01-01 DIAGNOSIS — E113593 Type 2 diabetes mellitus with proliferative diabetic retinopathy without macular edema, bilateral: Secondary | ICD-10-CM | POA: Diagnosis not present

## 2020-01-08 DIAGNOSIS — M47816 Spondylosis without myelopathy or radiculopathy, lumbar region: Secondary | ICD-10-CM | POA: Diagnosis not present

## 2020-01-20 ENCOUNTER — Ambulatory Visit
Admission: RE | Admit: 2020-01-20 | Discharge: 2020-01-20 | Disposition: A | Discharge status: Home / Self Care | Payer: Medicare Other | Source: Ambulatory Visit | Attending: Internal Medicine | Admitting: Internal Medicine

## 2020-01-20 ENCOUNTER — Other Ambulatory Visit: Payer: Self-pay

## 2020-01-20 DIAGNOSIS — Z1231 Encounter for screening mammogram for malignant neoplasm of breast: Secondary | ICD-10-CM

## 2020-01-30 DIAGNOSIS — Z23 Encounter for immunization: Secondary | ICD-10-CM | POA: Diagnosis not present

## 2020-03-15 DIAGNOSIS — H43813 Vitreous degeneration, bilateral: Secondary | ICD-10-CM | POA: Diagnosis not present

## 2020-03-15 DIAGNOSIS — H3582 Retinal ischemia: Secondary | ICD-10-CM | POA: Diagnosis not present

## 2020-03-15 DIAGNOSIS — H4312 Vitreous hemorrhage, left eye: Secondary | ICD-10-CM | POA: Diagnosis not present

## 2020-03-15 DIAGNOSIS — E113593 Type 2 diabetes mellitus with proliferative diabetic retinopathy without macular edema, bilateral: Secondary | ICD-10-CM | POA: Diagnosis not present

## 2020-03-16 DIAGNOSIS — J309 Allergic rhinitis, unspecified: Secondary | ICD-10-CM | POA: Diagnosis not present

## 2020-03-16 DIAGNOSIS — Z794 Long term (current) use of insulin: Secondary | ICD-10-CM | POA: Diagnosis not present

## 2020-03-16 DIAGNOSIS — F5101 Primary insomnia: Secondary | ICD-10-CM | POA: Diagnosis not present

## 2020-03-16 DIAGNOSIS — Z9884 Bariatric surgery status: Secondary | ICD-10-CM | POA: Diagnosis not present

## 2020-03-16 DIAGNOSIS — I10 Essential (primary) hypertension: Secondary | ICD-10-CM | POA: Diagnosis not present

## 2020-03-16 DIAGNOSIS — E113593 Type 2 diabetes mellitus with proliferative diabetic retinopathy without macular edema, bilateral: Secondary | ICD-10-CM | POA: Diagnosis not present

## 2020-03-16 DIAGNOSIS — E669 Obesity, unspecified: Secondary | ICD-10-CM | POA: Diagnosis not present

## 2020-03-21 ENCOUNTER — Emergency Department (HOSPITAL_COMMUNITY): Payer: Medicare Other

## 2020-03-21 ENCOUNTER — Other Ambulatory Visit: Payer: Self-pay

## 2020-03-21 ENCOUNTER — Emergency Department (HOSPITAL_COMMUNITY)
Admission: EM | Admit: 2020-03-21 | Discharge: 2020-03-21 | Disposition: A | Discharge status: Home / Self Care | Payer: Medicare Other | Attending: Emergency Medicine | Admitting: Emergency Medicine

## 2020-03-21 ENCOUNTER — Encounter (HOSPITAL_COMMUNITY): Payer: Self-pay

## 2020-03-21 DIAGNOSIS — E1122 Type 2 diabetes mellitus with diabetic chronic kidney disease: Secondary | ICD-10-CM | POA: Diagnosis not present

## 2020-03-21 DIAGNOSIS — Z7984 Long term (current) use of oral hypoglycemic drugs: Secondary | ICD-10-CM | POA: Insufficient documentation

## 2020-03-21 DIAGNOSIS — Z87891 Personal history of nicotine dependence: Secondary | ICD-10-CM | POA: Diagnosis not present

## 2020-03-21 DIAGNOSIS — R519 Headache, unspecified: Secondary | ICD-10-CM | POA: Diagnosis not present

## 2020-03-21 DIAGNOSIS — N189 Chronic kidney disease, unspecified: Secondary | ICD-10-CM | POA: Insufficient documentation

## 2020-03-21 DIAGNOSIS — R55 Syncope and collapse: Secondary | ICD-10-CM | POA: Diagnosis not present

## 2020-03-21 DIAGNOSIS — Z96651 Presence of right artificial knee joint: Secondary | ICD-10-CM | POA: Diagnosis not present

## 2020-03-21 DIAGNOSIS — I129 Hypertensive chronic kidney disease with stage 1 through stage 4 chronic kidney disease, or unspecified chronic kidney disease: Secondary | ICD-10-CM | POA: Diagnosis not present

## 2020-03-21 DIAGNOSIS — Z79899 Other long term (current) drug therapy: Secondary | ICD-10-CM | POA: Insufficient documentation

## 2020-03-21 DIAGNOSIS — W19XXXA Unspecified fall, initial encounter: Secondary | ICD-10-CM | POA: Diagnosis not present

## 2020-03-21 DIAGNOSIS — Z20822 Contact with and (suspected) exposure to covid-19: Secondary | ICD-10-CM | POA: Diagnosis not present

## 2020-03-21 DIAGNOSIS — Z794 Long term (current) use of insulin: Secondary | ICD-10-CM | POA: Diagnosis not present

## 2020-03-21 DIAGNOSIS — R110 Nausea: Secondary | ICD-10-CM | POA: Diagnosis not present

## 2020-03-21 DIAGNOSIS — J45909 Unspecified asthma, uncomplicated: Secondary | ICD-10-CM | POA: Diagnosis not present

## 2020-03-21 DIAGNOSIS — R4020 Unspecified coma: Secondary | ICD-10-CM | POA: Diagnosis not present

## 2020-03-21 DIAGNOSIS — Z743 Need for continuous supervision: Secondary | ICD-10-CM | POA: Diagnosis not present

## 2020-03-21 LAB — RESP PANEL BY RT-PCR (FLU A&B, COVID) ARPGX2
Influenza A by PCR: NEGATIVE
Influenza B by PCR: NEGATIVE
SARS Coronavirus 2 by RT PCR: NEGATIVE

## 2020-03-21 LAB — CBC
HCT: 40.5 % (ref 36.0–46.0)
Hemoglobin: 12.4 g/dL (ref 12.0–15.0)
MCH: 28.4 pg (ref 26.0–34.0)
MCHC: 30.6 g/dL (ref 30.0–36.0)
MCV: 92.7 fL (ref 80.0–100.0)
Platelets: 193 10*3/uL (ref 150–400)
RBC: 4.37 MIL/uL (ref 3.87–5.11)
RDW: 12.6 % (ref 11.5–15.5)
WBC: 3.8 10*3/uL — ABNORMAL LOW (ref 4.0–10.5)
nRBC: 0 % (ref 0.0–0.2)

## 2020-03-21 LAB — BASIC METABOLIC PANEL
Anion gap: 11 (ref 5–15)
BUN: 36 mg/dL — ABNORMAL HIGH (ref 8–23)
CO2: 23 mmol/L (ref 22–32)
Calcium: 9.5 mg/dL (ref 8.9–10.3)
Chloride: 102 mmol/L (ref 98–111)
Creatinine, Ser: 1.5 mg/dL — ABNORMAL HIGH (ref 0.44–1.00)
GFR, Estimated: 38 mL/min — ABNORMAL LOW (ref >60)
Glucose, Bld: 267 mg/dL — ABNORMAL HIGH (ref 70–99)
Potassium: 4.5 mmol/L (ref 3.5–5.1)
Sodium: 136 mmol/L (ref 135–145)

## 2020-03-21 MED ORDER — SODIUM CHLORIDE 0.9 % IV BOLUS
1000.0000 mL | Freq: Once | INTRAVENOUS | Status: AC
  Administered 2020-03-21: 1000 mL via INTRAVENOUS

## 2020-03-21 NOTE — ED Triage Notes (Signed)
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 patint denies feeling ill this weekend but reports fatigue. At breakfast and denies injury following event. Patient alert and oriented. CBG greater than 300 and took insulin this am

## 2020-03-21 NOTE — Discharge Instructions (Addendum)
You were seen in the ER for passing out episode  No cause or explanation was found today in ER  ER work up was reassuring  Stay hydrated. Keep close eye on glucose / sugar readings at home  Call primary care doctor tomorrow for follow up in one week  Return for chest pain, shortness of breath, palpitation, recurrent passing out or any other new or concerning symptoms

## 2020-03-21 NOTE — ED Provider Notes (Addendum)
[facility] EMERGENCY [facility] Provider Note   CSN: [id number] Arrival date & time: 03/21/20  1130     History No chief complaint on file.   Karen Gardner is a 64 y.o. female with pertinent past medical history of diabetes, hypertension presents to the ED for evaluation of syncope.  Brought to the ED by EMS.  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  She then remembers waking up on the floor and people shaking her.  Currently reports nausea and generalized headache but otherwise feels fine.  Feels like this all happened because she hasn't taken much fluids today. States she is ready to go home.  Reports having previous episodes of passing out at least 4 times over the last few years.  In the past she was told that she did not have enough "fluids" and at that time her glucose levels were high.  Does admit feeling "weak" for the last couple of days.  States typically she walks 2 miles every day but 2 days ago she walked 4 miles instead and states this is probably why she is feeling weak.  Also reports for the last week her glucose levels at home have been significantly higher than usual.  Her usual CBGs are around 100-1 50s but in the last week has been closer to 170.  Has been compliant with her diabetic medicines.  Thinks maybe it is related to stress.  Denies recent illnesses, fever, upper respiratory infection symptoms, vomiting, diarrhea, abdominal pain or urinary symptoms.  States in the past she has had cardiac work-up for passing out episodes and everything has come back normal.  Denies exertional chest pain or shortness of breath when she goes on her daily walks.  No palpitations.  Denies physical pain or injury from fall.   Additional information obtained from patient's friend at bedside who was present during the episode.  States patient was standing up reaching and just started leaning towards her left and  fell down.  Her eyes were closed and she passed out for approximately 1 minute.  Reports somebody saw her hitting her head.  When she woke up she could remember who everybody was or what had happened but states now patient is at her baseline.  HPI     Past Medical History:  Diagnosis Date  . Allergic rhinitis   . Anemia    has had to have iron infusions-sees dr Twanna Hyenever  . Anxiety   . Arthritis   . Asthma    childhood  . Chronic low back pain   . CKD (chronic kidney disease)    pt denies  . Diabetes mellitus without complication (HCC)    Type 2 on insulin pump  . GERD (gastroesophageal reflux disease)   . Headache    sinus headaches   . Hypercholesterolemia   . Hypertension   . Iron deficiency anemia 05/26/2015  . 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 Menopause   . Numbness in both hands    mostly at night  . Obesity   . Shortness of breath dyspnea    with exertion  . Vitamin D deficiency     Patient Active Problem List   Diagnosis Date Noted  . S/P laparoscopic sleeve gastrectomy Dec 2017 03/14/2016  . Iron deficiency anemia 05/26/2015  . 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 Primary osteoarthritis of right knee 08/05/2014  . Primary osteoarthritis of knee 08/05/2014  . Diabetes mellitus type 2, uncontrolled (HCC) 02/12/2014  .  Essential hypertension 02/12/2014  . Ankle fracture, bimalleolar, closed 02/11/2014  . Essential hypertension, benign 01/28/2014  . Type II diabetes mellitus, uncontrolled (HCC) 01/21/2014    Past Surgical History:  Procedure Laterality Date  . COLONOSCOPY    . EYE SURGERY Bilateral    [facility]   . HERNIA REPAIR     umb hernia as child  . KNEE ARTHROSCOPY  02/12/2012   Procedure: ARTHROSCOPY KNEE;  Surgeon: Nestor Lewandowsky, MD;  Location: [facility];  Service: Orthopedics;  Laterality: Right;  Partial Lateral Meniscectomy, Debridement chondromalacia  . LAPAROSCOPIC GASTRIC SLEEVE RESECTION N/A 03/14/2016   Procedure: LAPAROSCOPIC  GASTRIC SLEEVE RESECTION, WITH REPAIR OF HIATAL HERNIA REPAI, AND UPPER ENDO;  Surgeon: Luretha Murphy, MD;  Location: [facility];  Service: General;  Laterality: N/A;  . ORIF ANKLE FRACTURE Right 02/11/2014   Procedure: OPEN REDUCTION INTERNAL FIXATION (ORIF) RIGHT ANKLE FRACTURE;  Surgeon: Nestor Lewandowsky, MD;  Location: [facility];  Service: Orthopedics;  Laterality: Right;  . TOTAL KNEE ARTHROPLASTY Right 08/05/2014   Procedure: TOTAL KNEE ARTHROPLASTY;  Surgeon: Gean Birchwood, MD;  Location: [facility];  Service: Orthopedics;  Laterality: Right;  . TRIGGER FINGER RELEASE Right 10/14/2018   Procedure: RELEASE TRIGGER FINGER/A-1 PULLEY;  Surgeon: Betha Loa, MD;  Location: [facility];  Service: Orthopedics;  Laterality: Right;  . UPPER GI ENDOSCOPY       OB History   No obstetric history on file.     Family History  Problem Relation Age of Onset  . Alcohol abuse Father   . Heart attack Sister 45    Social History   Tobacco Use  . Smoking status: Former Smoker    Packs/day: 0.25    Years: 1.00    Pack years: 0.25    Types: Cigarettes    Quit date: 02/08/1972    Years since quitting: 48.1  . Smokeless tobacco: Never Used  Substance Use Topics  . Alcohol use: No    Alcohol/week: 0.0 standard drinks  . Drug use: No    Home Medications Prior to Admission medications   Medication Sig Start Date End Date Taking? Authorizing Provider  atorvastatin (LIPITOR) 40 MG tablet Take 40 mg by mouth at bedtime.     [provider]  CALCIUM PO Take 1 capsule by mouth 3 (three) times daily.     [provider]  diphenhydrAMINE (BENADRYL) 25 MG tablet Take 25 mg by mouth every 6 (six) hours as needed for allergies.    [provider]  escitalopram (LEXAPRO) 10 MG tablet Take 10 mg by mouth at bedtime.     [provider]  glucose blood (ONETOUCH VERIO) test strip Use as instructed to check blood sugar once a day dx code E11.65 02/04/14   Reather Littler, MD   Homeopathic Products (LEG CRAMPS) TABS Take 1 tablet by mouth at bedtime.    [provider]  HYDROcodone-acetaminophen (NORCO) 5-325 MG tablet 1-2 tabs po q6 hours prn pain 10/14/18   Betha Loa, MD  insulin lispro (HUMALOG KWIKPEN) 100 UNIT/ML KwikPen Inject 8 Units into the skin 3 (three) times daily.    [provider]  lansoprazole (PREVACID) 15 MG capsule Take 15 mg by mouth at bedtime.     [provider]  levocetirizine (XYZAL) 5 MG tablet Take 5 mg by mouth at bedtime as needed for allergies.    [provider]  losartan (COZAAR) 100 MG tablet Take 100 mg by mouth at bedtime.  [provider]  metFORMIN (GLUCOPHAGE) 500 MG tablet Take by mouth daily with breakfast.    [provider]  Multiple Vitamin (MULTIVITAMIN WITH MINERALS) TABS tablet Take 1 tablet by mouth 2 (two) times daily.    [provider]  Dola Argyle LANCETS FINE MISC Use to check blood sugar once a day dx code E11.65 01/28/14   Reather Littler, MD  OVER THE COUNTER MEDICATION Take 1 tablet by mouth at bedtime. Restless leg    [provider]    Allergies    Lactose intolerance (gi) and Oatmeal  Review of Systems   Review of Systems  Gastrointestinal: Positive for nausea.  Neurological: Positive for syncope, weakness (generalized) and headaches.  All other systems reviewed and are negative.   Physical Exam Updated Vital Signs BP (!) 169/68 (BP Location: Left Arm)   Pulse (!) 58   Temp 98.5 F (36.9 C) (Oral)   Resp 16   Ht 5' 5.5" (1.664 m)   Wt 89.4 kg   SpO2 100%   BMI 32.28 kg/m   Physical Exam Vitals and nursing note reviewed.  Constitutional:      General: She is not in acute distress.    Appearance: She is well-developed and well-nourished.     Comments: NAD. Patient in hall bed.   HENT:     Head: Normocephalic and atraumatic.     Comments: No facial/scalp bone tenderness or signs of trauma. Patient does not want to  remove wig     Right Ear: External ear normal.     Left Ear: External ear normal.     Nose: Nose normal.  Eyes:     General: No scleral icterus.    Extraocular Movements: EOM normal.     Conjunctiva/sclera: Conjunctivae normal.  Cardiovascular:     Rate and Rhythm: Normal rate and regular rhythm.     Heart sounds: Normal heart sounds.  Pulmonary:     Effort: Pulmonary effort is normal.     Breath sounds: Normal breath sounds. No wheezing.  Abdominal:     Palpations: Abdomen is soft.     Tenderness: There is no abdominal tenderness.  Musculoskeletal:        General: Normal range of motion.     Cervical back: Normal range of motion and neck supple.     Comments: No midline or paraspinal muscle tenderness   Skin:    General: Skin is warm and dry.     Capillary Refill: Capillary refill takes less than 2 seconds.  Neurological:     Mental Status: She is alert and oriented to person, place, and time.     Comments:   Awake, alert. Speech clear. Sensation to light touch intact in face, upper/lower extremities. Strength equal and symmetric bilaterally. No arm or leg drop/drift. Normal FTN. CN 2-12 intact.   Psychiatric:        Mood and Affect: Mood and affect normal.        Behavior: Behavior normal.        Thought Content: Thought content normal.        Judgment: Judgment normal.     ED Results / Procedures / Treatments   Labs (all labs ordered are listed, but only abnormal results are displayed) Labs Reviewed  BASIC METABOLIC PANEL - Abnormal; Notable for the following components:      Result Value   Glucose, Bld 267 (*)    BUN 36 (*)    Creatinine, Ser 1.50 (*)  GFR, Estimated 38 (*)    All other components within normal limits  CBC - Abnormal; Notable for the following components:   WBC 3.8 (*)    All other components within normal limits  RESP PANEL BY RT-PCR (FLU A&B, COVID) [id number]  URINALYSIS, ROUTINE W REFLEX MICROSCOPIC  CBG MONITORING, ED    EKG EKG  Interpretation  Date/Time:  Sunday March 21 2020 12:00:52 EST Ventricular Rate:  70 PR Interval:  146 QRS Duration: 74 QT Interval:  366 QTC Calculation: 395 R Axis:   17 Text Interpretation: Normal sinus rhythm Low voltage QRS Septal infarct , age undetermined Abnormal ECG No significant change from Jan 2020 ecg No STEMI Confirmed by Trifan, Matthew ([id number]) on 03/21/2020 5:51:54 PM   Radiology CT Head Wo Contrast  Result Date: 03/21/2020 CLINICAL DATA:  Syncope, hypertension, headache EXAM: CT HEAD WITHOUT CONTRAST TECHNIQUE: Contiguous axial images were obtained from the base of the skull through the vertex without intravenous contrast. COMPARISON:  09/07/2015 FINDINGS: Brain: Progressive hypodensities throughout the periventricular and subcortical white matter most consistent with chronic small vessel ischemic changes. No other signs of acute infarct or hemorrhage. Lateral ventricles and midline structures are stable. There are no acute extra-axial fluid collections. No mass effect. Vascular: No hyperdense vessel or unexpected calcification. Skull: Normal. Negative for fracture or focal lesion. Sinuses/Orbits: No acute finding. Other: None. IMPRESSION: 1. Chronic small vessel ischemic changes throughout the white matter, more pronounced than previous. 2. Otherwise no acute intracranial process. Electronically Signed   By: Michael  Brown M.D.   On: 03/21/2020 19:52    Procedures Procedures (including critical care time)  Medications Ordered in ED Medications  sodium chloride 0.9 % bolus 1,000 mL (1,000 mLs Intravenous New Bag/Given 03/21/20 2005)    ED Course  I have reviewed the triage vital signs and the nursing notes.  Pertinent labs & imaging results that were available during my care of the patient were reviewed by me and considered in my medical decision making (see chart for details).  Clinical Course as of 03/21/20 2130  Sun Mar 21, 2020  1828 Glucose(!): 267 [CG]   1828 Creatinine(!): 1.50 [CG]  1829 WBC(!): 3.8 [CG]  1829 ED EKG Normal sinus rhythm Low voltage QRS Septal infarct , age undetermined Abnormal ECG No significant change from Jan 2020 ecg No STEMI Confirmed by Trifan, Matthew ([id number]) on 03/21/2020 5:51:54 PM [CG]  2003 64 yo female here with syncope while at church this morning.  She has had multiple episodes in the past 3 years and has had a benign outpatient echocardiogram for this.  She reports hx of lap-band surgery for weight loss and states, "It\'s always because I can\'t drink enough water to stay hydrated, and I know that\'s what it was this time."  She is back to her baseline.  Episode lasted approx 30-60 seconds at church today, and it\'s unclear if it was true syncope or near syncope.  Labs today show mild elevation of BUN and Cr suggestive of prerenal/dehydration.  CTH with chronic findings, no acute injury.  As she is asymptomatic now, plan to give some IV fluids and can discharge home. [MT]    Clinical Course User Index [CG] Quynh Basso J, PA-C [MT] Trifan, Matthew J, MD   MDM Rules/Calculators/A&P                          64  yo F presents to ER for witnessed syncope and collapse with prodrome  of light-headedness and tunnel vision. Occurred while standing. Reports not drinking a lot of fluids today. History of same in the past. Reports nausea and headache after syncope. +Head trauma by bystanders.   Reports feeling weak for the last couple of days, attributes this to walking more than usual two days ago. Usually walks 2 miles daily, no exertional CP, SOB, palpitations.  Elevated CBG at home for last week.   ddx includes orthostasis vs vasovagal vs arrhythmia. Doubt stroke. Given hyperglycemia for last week, weakness possible infection considered however no localizing symptoms of infection.   EMR triage and nursing notes reviewed  Seen by cardiology for syncope. Had echo and holter monitor 2018 that were unremarkable. No  history of seizures. No reported bladder, bowel incontinence, tongue injury.   Labs and imaging ordered as above. Orthostatics. PO challenge. Ambulation trial here.   Medicines ordered: 1 L IVF  2130: ER work up personally visualized and interpreted  Labs reveal - hyperglycemia with AG. WBC 3.8. Creatinine stable. Unable to obtain UA but patient with vague "weakness", no UTI symptoms, will defer.   Imaging reveals - no acute findings on head CT, EKG without ischemia, arrhythmias, normal intervals.   Patient re-evaluated. Patient tolerating PO, ambulatory without symptoms, negative orthostatics.   Given history of syncope in the past, benign work up here and cardiac work up in 2018 that was unrevealing considered appropriate for discharge.  Suspect orthostasis/dehyration, vasovagal episode. Considered seizure, TIA< stroke, life threatening arrhythmias but unlikely. Will recommend close PCP follow up. Return precautions given. Shared with EDP who agrees with ER POC and disposition.    Final Clinical Impression(s) / ED Diagnoses Final diagnoses:  Syncope and collapse    Rx / DC Orders ED Discharge Orders    None       Jerrell Mylar 03/21/20 2131    Terald Sleeper, MD 03/21/20 2255

## 2020-04-09 DIAGNOSIS — E113593 Type 2 diabetes mellitus with proliferative diabetic retinopathy without macular edema, bilateral: Secondary | ICD-10-CM | POA: Diagnosis not present

## 2020-04-09 DIAGNOSIS — Z9884 Bariatric surgery status: Secondary | ICD-10-CM | POA: Diagnosis not present

## 2020-04-09 DIAGNOSIS — M8580 Other specified disorders of bone density and structure, unspecified site: Secondary | ICD-10-CM | POA: Diagnosis not present

## 2020-04-09 DIAGNOSIS — Z794 Long term (current) use of insulin: Secondary | ICD-10-CM | POA: Diagnosis not present

## 2020-04-13 DIAGNOSIS — Z7984 Long term (current) use of oral hypoglycemic drugs: Secondary | ICD-10-CM | POA: Diagnosis not present

## 2020-04-13 DIAGNOSIS — E113593 Type 2 diabetes mellitus with proliferative diabetic retinopathy without macular edema, bilateral: Secondary | ICD-10-CM | POA: Diagnosis not present

## 2020-04-14 DIAGNOSIS — H401134 Primary open-angle glaucoma, bilateral, indeterminate stage: Secondary | ICD-10-CM | POA: Diagnosis not present

## 2020-04-28 DIAGNOSIS — H401134 Primary open-angle glaucoma, bilateral, indeterminate stage: Secondary | ICD-10-CM | POA: Diagnosis not present

## 2020-05-12 DIAGNOSIS — Z20822 Contact with and (suspected) exposure to covid-19: Secondary | ICD-10-CM | POA: Diagnosis not present

## 2020-05-12 DIAGNOSIS — Z03818 Encounter for observation for suspected exposure to other biological agents ruled out: Secondary | ICD-10-CM | POA: Diagnosis not present

## 2020-06-11 DIAGNOSIS — H401134 Primary open-angle glaucoma, bilateral, indeterminate stage: Secondary | ICD-10-CM | POA: Diagnosis not present

## 2020-06-15 DIAGNOSIS — Z471 Aftercare following joint replacement surgery: Secondary | ICD-10-CM | POA: Diagnosis not present

## 2020-06-15 DIAGNOSIS — M1712 Unilateral primary osteoarthritis, left knee: Secondary | ICD-10-CM | POA: Diagnosis not present

## 2020-06-15 DIAGNOSIS — Z96651 Presence of right artificial knee joint: Secondary | ICD-10-CM | POA: Diagnosis not present

## 2020-06-29 DIAGNOSIS — M1712 Unilateral primary osteoarthritis, left knee: Secondary | ICD-10-CM | POA: Diagnosis not present

## 2020-07-01 DIAGNOSIS — H4312 Vitreous hemorrhage, left eye: Secondary | ICD-10-CM | POA: Diagnosis not present

## 2020-07-01 DIAGNOSIS — H3582 Retinal ischemia: Secondary | ICD-10-CM | POA: Diagnosis not present

## 2020-07-01 DIAGNOSIS — E113593 Type 2 diabetes mellitus with proliferative diabetic retinopathy without macular edema, bilateral: Secondary | ICD-10-CM | POA: Diagnosis not present

## 2020-07-01 DIAGNOSIS — H43813 Vitreous degeneration, bilateral: Secondary | ICD-10-CM | POA: Diagnosis not present

## 2020-07-06 DIAGNOSIS — M1712 Unilateral primary osteoarthritis, left knee: Secondary | ICD-10-CM | POA: Diagnosis not present

## 2020-07-13 DIAGNOSIS — M1712 Unilateral primary osteoarthritis, left knee: Secondary | ICD-10-CM | POA: Diagnosis not present

## 2020-09-07 DIAGNOSIS — M1712 Unilateral primary osteoarthritis, left knee: Secondary | ICD-10-CM | POA: Diagnosis not present

## 2020-09-09 DIAGNOSIS — Z1152 Encounter for screening for COVID-19: Secondary | ICD-10-CM | POA: Diagnosis not present

## 2020-09-16 DIAGNOSIS — U071 COVID-19: Secondary | ICD-10-CM | POA: Diagnosis not present

## 2020-09-16 DIAGNOSIS — R197 Diarrhea, unspecified: Secondary | ICD-10-CM | POA: Diagnosis not present

## 2020-09-16 DIAGNOSIS — J0100 Acute maxillary sinusitis, unspecified: Secondary | ICD-10-CM | POA: Diagnosis not present

## 2020-09-16 DIAGNOSIS — E113593 Type 2 diabetes mellitus with proliferative diabetic retinopathy without macular edema, bilateral: Secondary | ICD-10-CM | POA: Diagnosis not present

## 2020-09-16 DIAGNOSIS — I10 Essential (primary) hypertension: Secondary | ICD-10-CM | POA: Diagnosis not present

## 2020-09-21 DIAGNOSIS — I10 Essential (primary) hypertension: Secondary | ICD-10-CM | POA: Diagnosis not present

## 2020-09-21 DIAGNOSIS — E113593 Type 2 diabetes mellitus with proliferative diabetic retinopathy without macular edema, bilateral: Secondary | ICD-10-CM | POA: Diagnosis not present

## 2020-09-21 DIAGNOSIS — Z7984 Long term (current) use of oral hypoglycemic drugs: Secondary | ICD-10-CM | POA: Diagnosis not present

## 2020-09-24 DIAGNOSIS — Z7984 Long term (current) use of oral hypoglycemic drugs: Secondary | ICD-10-CM | POA: Diagnosis not present

## 2020-09-24 DIAGNOSIS — Z1211 Encounter for screening for malignant neoplasm of colon: Secondary | ICD-10-CM | POA: Diagnosis not present

## 2020-09-24 DIAGNOSIS — I10 Essential (primary) hypertension: Secondary | ICD-10-CM | POA: Diagnosis not present

## 2020-09-24 DIAGNOSIS — Z0000 Encounter for general adult medical examination without abnormal findings: Secondary | ICD-10-CM | POA: Diagnosis not present

## 2020-09-24 DIAGNOSIS — E669 Obesity, unspecified: Secondary | ICD-10-CM | POA: Diagnosis not present

## 2020-09-24 DIAGNOSIS — Z1231 Encounter for screening mammogram for malignant neoplasm of breast: Secondary | ICD-10-CM | POA: Diagnosis not present

## 2020-09-24 DIAGNOSIS — F419 Anxiety disorder, unspecified: Secondary | ICD-10-CM | POA: Diagnosis not present

## 2020-09-24 DIAGNOSIS — Z8616 Personal history of COVID-19: Secondary | ICD-10-CM | POA: Diagnosis not present

## 2020-09-24 DIAGNOSIS — E113593 Type 2 diabetes mellitus with proliferative diabetic retinopathy without macular edema, bilateral: Secondary | ICD-10-CM | POA: Diagnosis not present

## 2020-09-24 DIAGNOSIS — Z1389 Encounter for screening for other disorder: Secondary | ICD-10-CM | POA: Diagnosis not present

## 2020-09-30 DIAGNOSIS — E113593 Type 2 diabetes mellitus with proliferative diabetic retinopathy without macular edema, bilateral: Secondary | ICD-10-CM | POA: Diagnosis not present

## 2020-10-07 ENCOUNTER — Encounter (HOSPITAL_COMMUNITY): Payer: Self-pay | Admitting: *Deleted

## 2020-10-21 DIAGNOSIS — M8580 Other specified disorders of bone density and structure, unspecified site: Secondary | ICD-10-CM | POA: Diagnosis not present

## 2020-10-21 DIAGNOSIS — Z01818 Encounter for other preprocedural examination: Secondary | ICD-10-CM | POA: Diagnosis not present

## 2020-10-21 DIAGNOSIS — Z794 Long term (current) use of insulin: Secondary | ICD-10-CM | POA: Diagnosis not present

## 2020-10-21 DIAGNOSIS — M25562 Pain in left knee: Secondary | ICD-10-CM | POA: Diagnosis not present

## 2020-10-21 DIAGNOSIS — E113593 Type 2 diabetes mellitus with proliferative diabetic retinopathy without macular edema, bilateral: Secondary | ICD-10-CM | POA: Diagnosis not present

## 2020-10-21 DIAGNOSIS — Z9884 Bariatric surgery status: Secondary | ICD-10-CM | POA: Diagnosis not present

## 2020-10-21 DIAGNOSIS — E1165 Type 2 diabetes mellitus with hyperglycemia: Secondary | ICD-10-CM | POA: Diagnosis not present

## 2020-10-22 DIAGNOSIS — I10 Essential (primary) hypertension: Secondary | ICD-10-CM | POA: Diagnosis not present

## 2020-10-22 DIAGNOSIS — F5101 Primary insomnia: Secondary | ICD-10-CM | POA: Diagnosis not present

## 2020-10-22 DIAGNOSIS — R0600 Dyspnea, unspecified: Secondary | ICD-10-CM | POA: Diagnosis not present

## 2020-10-22 DIAGNOSIS — F419 Anxiety disorder, unspecified: Secondary | ICD-10-CM | POA: Diagnosis not present

## 2020-10-22 DIAGNOSIS — R9431 Abnormal electrocardiogram [ECG] [EKG]: Secondary | ICD-10-CM | POA: Diagnosis not present

## 2020-10-22 DIAGNOSIS — M1712 Unilateral primary osteoarthritis, left knee: Secondary | ICD-10-CM | POA: Diagnosis not present

## 2020-10-25 ENCOUNTER — Other Ambulatory Visit: Payer: Self-pay | Admitting: Internal Medicine

## 2020-10-25 DIAGNOSIS — R0600 Dyspnea, unspecified: Secondary | ICD-10-CM

## 2020-11-08 DIAGNOSIS — H43813 Vitreous degeneration, bilateral: Secondary | ICD-10-CM | POA: Diagnosis not present

## 2020-11-08 DIAGNOSIS — H3582 Retinal ischemia: Secondary | ICD-10-CM | POA: Diagnosis not present

## 2020-11-08 DIAGNOSIS — H4312 Vitreous hemorrhage, left eye: Secondary | ICD-10-CM | POA: Diagnosis not present

## 2020-11-08 DIAGNOSIS — E113593 Type 2 diabetes mellitus with proliferative diabetic retinopathy without macular edema, bilateral: Secondary | ICD-10-CM | POA: Diagnosis not present

## 2020-11-16 ENCOUNTER — Other Ambulatory Visit: Payer: Self-pay

## 2020-11-16 ENCOUNTER — Encounter: Payer: Self-pay | Admitting: Hematology & Oncology

## 2020-11-16 ENCOUNTER — Ambulatory Visit
Admission: RE | Admit: 2020-11-16 | Discharge: 2020-11-16 | Disposition: A | Discharge status: Home / Self Care | Payer: No Typology Code available for payment source | Source: Ambulatory Visit | Attending: Internal Medicine | Admitting: Internal Medicine

## 2020-11-16 DIAGNOSIS — I25118 Atherosclerotic heart disease of native coronary artery with other forms of angina pectoris: Secondary | ICD-10-CM

## 2020-11-16 DIAGNOSIS — R931 Abnormal findings on diagnostic imaging of heart and coronary circulation: Secondary | ICD-10-CM

## 2020-11-16 DIAGNOSIS — R0600 Dyspnea, unspecified: Secondary | ICD-10-CM

## 2020-11-16 HISTORY — DX: Atherosclerotic heart disease of native coronary artery with other forms of angina pectoris: I25.118

## 2020-11-16 HISTORY — DX: Abnormal findings on diagnostic imaging of heart and coronary circulation: R93.1

## 2020-12-17 ENCOUNTER — Encounter: Payer: Self-pay | Admitting: Hematology & Oncology

## 2020-12-30 ENCOUNTER — Encounter: Payer: Self-pay | Admitting: Cardiology

## 2020-12-30 ENCOUNTER — Ambulatory Visit (INDEPENDENT_AMBULATORY_CARE_PROVIDER_SITE_OTHER): Payer: Medicare Other | Admitting: Cardiology

## 2020-12-30 ENCOUNTER — Other Ambulatory Visit: Payer: Self-pay

## 2020-12-30 VITALS — BP 148/88 | HR 72 | Ht 65.5 in | Wt 202.8 lb

## 2020-12-30 DIAGNOSIS — R931 Abnormal findings on diagnostic imaging of heart and coronary circulation: Secondary | ICD-10-CM

## 2020-12-30 DIAGNOSIS — E1169 Type 2 diabetes mellitus with other specified complication: Secondary | ICD-10-CM | POA: Diagnosis not present

## 2020-12-30 DIAGNOSIS — R079 Chest pain, unspecified: Secondary | ICD-10-CM

## 2020-12-30 DIAGNOSIS — R9431 Abnormal electrocardiogram [ECG] [EKG]: Secondary | ICD-10-CM

## 2020-12-30 DIAGNOSIS — E1165 Type 2 diabetes mellitus with hyperglycemia: Secondary | ICD-10-CM | POA: Diagnosis not present

## 2020-12-30 DIAGNOSIS — R072 Precordial pain: Secondary | ICD-10-CM | POA: Diagnosis not present

## 2020-12-30 DIAGNOSIS — E785 Hyperlipidemia, unspecified: Secondary | ICD-10-CM

## 2020-12-30 DIAGNOSIS — R0600 Dyspnea, unspecified: Secondary | ICD-10-CM

## 2020-12-30 DIAGNOSIS — I10 Essential (primary) hypertension: Secondary | ICD-10-CM | POA: Diagnosis not present

## 2020-12-30 DIAGNOSIS — R0609 Other forms of dyspnea: Secondary | ICD-10-CM

## 2020-12-30 MED ORDER — METOPROLOL TARTRATE 50 MG PO TABS: 100.0000 mg | ORAL_TABLET | Freq: Once | ORAL | 0 refills | Status: DC

## 2020-12-30 NOTE — Patient Instructions (Addendum)
Medication Instructions:   No changes  *If you need a refill on your cardiac medications before your next appointment, please call your pharmacy*   Lab Work: BMP  If you have labs (blood work) drawn today and your tests are completely normal, you will receive your results only by: MyChart Message (if you have MyChart) OR A paper copy in the mail If you have any lab test that is abnormal or we need to change your treatment, we will call you to review the results.   Testing/Procedures: Will be schedule at [facility] radiology dept.  Your physician has requested that you have cardiac CTA. Cardiac computed tomography (CT) is a painless test that uses an x-ray machine to take clear, detailed pictures of your heart. Please follow instruction sheet as given.     Follow-Up: At [facility], you and your health needs are our priority.  As part of our continuing mission to provide you with exceptional heart care, we have created designated Provider Care Teams.  These Care Teams include your primary Cardiologist (physician) and Advanced Practice Providers (APPs -  Physician Assistants and Nurse Practitioners) who all work together to provide you with the care you need, when you need it.     Your next appointment:   2 month(s)  The format for your next appointment:   In Person  Provider:   Bryan Lemma, MD   Other Instructions    Your cardiac CT will be scheduled at  the below location:   [facility] [address] [place], [place] [address] [phone]    Please arrive at the [facility] main entrance (entrance A) of [facility] 30 minutes prior to test start time. Proceed to the [facility] Radiology Department (first floor) to check-in and test prep.  If scheduled at [facility], please arrive 15 mins early for check-in and test prep.  Please follow these instructions carefully (unless otherwise directed):   Please  have BMP lab at least one week prior to test.   On the Night Before the Test: Be sure to Drink plenty of water. Do not consume any caffeinated/decaffeinated beverages or chocolate 12 hours prior to your test. Do not take any antihistamines 12 hours prior to your test.   On the Day of the Test: Drink plenty of water until 1 hour prior to the test. Do not eat any food 4 hours prior to the test. You may take your regular medications prior to the test.  Take metoprolol (Lopressor) 100 mg two hours prior to test. FEMALES- please wear underwire-free bra if available, avoid dresses & tight clothing       After the Test: Drink plenty of water. After receiving IV contrast, you may experience a mild flushed feeling. This is normal. On occasion, you may experience a mild rash up to 24 hours after the test. This is not dangerous. If this occurs, you can take Benadryl 25 mg and increase your fluid intake. If you experience trouble breathing, this can be serious. If it is severe call 911 IMMEDIATELY. If it is mild, please call our office. If you take any of these medications: Metformin, please do not take 48 hours after completing test unless otherwise instructed.  Please allow 2-4 weeks for scheduling of routine cardiac CTs. Some insurance companies require a pre-authorization which may delay scheduling of this test.   For non-scheduling related questions, please contact the cardiac imaging nurse navigator should you have any questions/concerns: Huntley Dec  Juleen China, Cardiac Imaging Nurse Navigator Gordy Clement, Cardiac Imaging Nurse Navigator [facility] Direct Office Dial: [phone]   For scheduling needs, including cancellations and rescheduling, please call [place], [phone].

## 2020-12-30 NOTE — Progress Notes (Signed)
Primary Care Provider: Leeroy Cha, MD [facility] Cardiologist: None.  Previously seen by Dr. Percival Spanish for syncope in 2018 Electrophysiologist: None  Clinic Note: Chief Complaint  Patient presents with   New Patient (Initial Visit)    Abnormal coronary calcium score   Chest Pain    Intermittent episodes lasting 5 to 20 seconds.   Abnormal ECG    Suggests prior anteroseptal infarct-no change from previous    ===================================  ASSESSMENT/PLAN   Problem List Items Addressed This Visit       Cardiology Problems   Essential hypertension - Primary (Chronic)    Her blood pressure is pretty high today, but she admits to being quite anxious when she came in today.  She has not taken her losartan yet this morning.  We can reassess on follow-up visit.  Pending results on coronary CTA, may very well want to initiate beta-blocker such as carvedilol.      Relevant Medications   metoprolol tartrate (LOPRESSOR) 50 MG tablet   Agatston coronary artery calcium score greater than 400 (2245!!) (Chronic)    Pretty significantly elevated coronary calcium score in a patient with longstanding diabetes and hypertension.  With her having intermittent episodes of chest pain although they seem to be musculoskeletal in nature, in order to get a clear sense of preoperative risk, an ischemic evaluation is warranted simply because she cannot exercise due to knee pain.  Plan: Cardiac CT Angiogram with possible FFR CT to evaluate both anatomy and physiology.  (If there is significant amount of calcification, we may need to adjust and use a Myoview for ischemic evaluation once we have the initial angiography). Anticipate being more aggressive with lipid management.  She is currently on atorvastatin 40 mg-Target LDL should probably be less than 70.  Pending results of coronary CTA, would likely consider adding ezetimibe.      Relevant Medications   metoprolol tartrate  (LOPRESSOR) 50 MG tablet   Hyperlipidemia associated with type 2 diabetes mellitus (HCC) (Chronic)    Total cholesterol 170 and LDL of 77 but HDL is 82.  With significant coronary artery calcification patient who has hypertension, diabetes and obesity-she is in the metabolic syndrome category already at high risk.  Goal LDL should be less than 70.  Anticipate rechecking lipid panel in follow-up, if LDL still above 70, would like to consider adding ezetimibe.  She is already on a GLP-1 agonist and metformin along with insulin.  A1c is 8.  Anticipate that she probably needs more aggressive control.      Relevant Medications   metoprolol tartrate (LOPRESSOR) 50 MG tablet   Dulaglutide (TRULICITY) 6.19 JK/9.3OI SOPN   Other Relevant Orders   CT CORONARY MORPH W/CTA COR W/SCORE W/CA W/CM &/OR WO/CM     Other   Diabetes mellitus type 2, uncontrolled (HCC)   Relevant Medications   Dulaglutide (TRULICITY) 7.12 WP/8.0DX SOPN   Precordial pain    Her chest pain sounds very musculoskeletal symptoms and make her double over in pain, not exacerbated by exertion.  However with a significant elevated coronary calcium score ischemic evaluation is clearly indicated.  EKG suggest prior anterior infarct, age indeterminate.  However she has had similar EKGs for years, previously evaluated with an echocardiogram showing no anterior wall motion normality which would mean that this is simply related to body habitus.  Plan: Cardiac CTA for clarification purposes.      Relevant Orders   EKG 12-Lead   CT CORONARY MORPH W/CTA COR W/SCORE W/CA  W/CM &/OR WO/CM   Dyspnea on exertion    I suspect that some of her exertional dyspnea is related to deconditioning since her knees are bothering her, but in light of coronary calcium score being 2000, we do need to exclude ischemic CAD.  Plan: Cardiac CTA with possible CT FFR.  (Anticipate that we may not build to do CT FFR, and may need to convert to Myoview to complete  the ischemic evaluation).      Nonspecific abnormal electrocardiogram (ECG) (EKG)    Her EKG actually is relatively normal.  There is low voltage in the precordial leads and " Q waves" in V1 and V2, but similar findings were noted in 2018.  Evaluated with an echocardiogram showing no evidence of anterior wall motion abnormality.  I would not consider this to be suggestive of prior MI.      Other Visit Diagnoses     Chest pain, unspecified type       Relevant Orders   EKG 12-Lead   CT CORONARY MORPH W/CTA COR W/SCORE W/CA W/CM &/OR WO/CM   Basic metabolic panel       ===================================  HPI:    Karen Gardner is a 65 y.o. female with a PMH notable for DM-2 (complicated by Diabetic Retinopathy), HTN and h/o Vasovagal Syncope (December 21 -> was standing at [employer], began feeling lightheaded with tunnel vision then woke up on the floor with people around her-> reportedly had been at least 3-4 previous episodes.) who is being seen today for ELEVATED CORONARY CALCIUM SCORE 7& POSSIBLE UPCOMING KNEE TKA at the request of Fara Olden, Rupashree,*.  She was seen by cardiology apparently in 2018 with an echo and Holter monitor were unremarkable (see below).  Recent Hospitalizations:  ER visit in December 2021 with witnessed syncope and collapse.  Had a prodrome of lightheadedness and tunnel vision.  This occurred while standing.  She did acknowledge for p.o. intake of food and water over the past few days.  Noted feeling weak tired and walked more briskly over the previous days.  Karen Gardner was seen on October 22, 2020 by Dr. Fara Olden presumably for surgical clearance for potential knee surgery.  There was concerned about EKG showing old anteroseptal infarct.  There was discussion about knee replacement by Dr. Justine Null 1 from [place] on August 7.  (According to the patient, she never agreed to that.)  She was noting some dyspnea on exertion at  that time, and PCP ordered coronary calcium score and referred to cardiology for stress testing.  Reviewed  CV studies:    The following studies were reviewed today: (if available, images/films reviewed: From Epic Chart or Care Everywhere) TTE February 2018: EF 60 to 65%.  Severe basal septal LVH with mild concentric LVH.  No R WMA.  GR 1 DD.  Indeterminate filling pressures. Minimal valve disease. Event Monitor March 2018: NSR with rare ectopy.  No etiology for syncope.  CORONARY CALCIUM SCORE: Agatston Score 2245.  LAD 1093, LCx 959, RCA 193 (99th percentile)   Interval History:   Karen Gardner is here today, not quite sure of the reason for the referral.  She did not seem to be aware of the results of the coronary calcium score.  She also states that she had decided against having the surgery at the time.  She tells me that years ago she was given a diagnosis of "arthritis in the chest "(likely costochondritis).  Just last tried talking to her  she doubled over in pain holding her chest stating that she was having chest pain.  She has intermittent episodes of this type of chest pain that can happen at any time-with or without exertion, but not exacerbated by exertion.  She said these episodes only last few seconds to maybe a minute the most.  Not associate with dyspnea except for the very initial onset worried to take her breath away.  No associated diaphoresis or prolonged dyspnea.  She interestingly says that she was pretty active walking quite a bit until her knee started bothering her.  Now that her left knee has been bothering her for several months, she has been doing less and less activity and less and less exercise.  She now gets short of breath, simply because she is deconditioned, but denies any chest pain or pressure with exertion..  She says it every now and then, she will feel some skipped beats per flip-flopping, nothing prolonged.  No prolonged rapid irregular heartbeats or  palpitations.  No syncope/near syncope or TIA/amaurosis fugax.  No claudication.  No PND, orthopnea or edema.  REVIEWED OF SYSTEMS   Review of Systems  Constitutional:  Negative for malaise/fatigue (She feels like she is getting lazy because of her knee pain keeping her from exercising) and weight loss (Actually gaining weight since her knees been bothering her.).  HENT:  Negative for congestion and nosebleeds.   Respiratory:  Positive for cough (Off-and-on cough that is usually associated with allergies.  Nonproductive.) and shortness of breath (Related to deconditioning). Negative for wheezing (Sometimes when she has bad allergies she wheezes but not routinely.).   Cardiovascular:  Positive for chest pain (Chest pain episodes as noted above.) and orthopnea (She sleeps on 3 pillows more for comfort than dyspnea.).       Per HPI  Gastrointestinal:  Negative for abdominal pain, blood in stool and melena.  Genitourinary:  Negative for hematuria.  Musculoskeletal:  Positive for joint pain (Limited by left knee pain.  She is already had a right knee operated on). Negative for falls.  Neurological:  Negative for dizziness and headaches.  Endo/Heme/Allergies:  Positive for environmental allergies.  Psychiatric/Behavioral:  Negative for memory loss. The patient is not nervous/anxious and does not have insomnia.    I have reviewed and (if needed) personally updated the patient's problem list, medications, allergies, past medical and surgical history, social and family history.   PAST MEDICAL HISTORY   Past Medical History:  Diagnosis Date   Agatston coronary artery calcium score greater than 400 (2245!!) 11/16/2020   11/16/2020: CORONARY CALCIUM SCORE: Agatston Score 2245.  LAD 1093, LCx 959, RCA 193 (99th percentile)    Allergic rhinitis    Anxiety    Arthritis    Asthma    childhood   Chronic low back pain    CKD (chronic kidney disease), stage III ([place])    However, most recent creatinine  1.5.   Depression    With anxiety   GERD (gastroesophageal reflux disease)    Headache    sinus headaches    Hypercholesterolemia    Hypertension    Iron deficiency anemia 05/26/2015   Has required transfusions-followed by Dr. Marin Olp   Iron malabsorption 05/26/2015   Menopause    Numbness in both hands    mostly at night   Obesity    Osteoarthritis of both knees    Status post right TKA (and ankle) with plans for left TKA   Type 2 diabetes mellitus without complication,  with long-term current use of insulin ([place])    Type 2 on insulin pump   Vasovagal syncope 2018   Negative work-up   Vitamin D deficiency     PAST SURGICAL HISTORY   Past Surgical History:  Procedure Laterality Date   COLONOSCOPY     EYE SURGERY Bilateral    [place]    HERNIA REPAIR     umb hernia as child   KNEE ARTHROSCOPY  02/12/2012   Procedure: ARTHROSCOPY KNEE;  Surgeon: Kerin Salen, MD;  Location: [place];  Service: Orthopedics;  Laterality: Right;  Partial Lateral Meniscectomy, Debridement chondromalacia   LAPAROSCOPIC GASTRIC SLEEVE RESECTION N/A 03/14/2016   Procedure: LAPAROSCOPIC GASTRIC SLEEVE RESECTION, WITH REPAIR OF HIATAL HERNIA REPAI, AND UPPER ENDO;  Surgeon: Johnathan Hausen, MD;  Location: [facility];  Service: General;  Laterality: N/A;   ORIF ANKLE FRACTURE Right 02/11/2014   Procedure: OPEN REDUCTION INTERNAL FIXATION (ORIF) RIGHT ANKLE FRACTURE;  Surgeon: Kerin Salen, MD;  Location: [place];  Service: Orthopedics;  Laterality: Right;   TOTAL KNEE ARTHROPLASTY Right 08/05/2014   Procedure: TOTAL KNEE ARTHROPLASTY;  Surgeon: Frederik Pear, MD;  Location: [place];  Service: Orthopedics;  Laterality: Right;   TRIGGER FINGER RELEASE Right 10/14/2018   Procedure: RELEASE TRIGGER FINGER/A-1 PULLEY;  Surgeon: Leanora Cover, MD;  Location: [place];  Service: Orthopedics;  Laterality: Right;   UPPER GI ENDOSCOPY      Immunization History  Administered Date(s)  Administered   Influenza-Unspecified 01/07/2014, 12/03/2014   PPD Test 02/14/2014   Pneumococcal-Unspecified 02/14/2014    MEDICATIONS/ALLERGIES   Current Meds  Medication Sig   atorvastatin (LIPITOR) 40 MG tablet Take 40 mg by mouth at bedtime.    CALCIUM PO Take 1 capsule by mouth 3 (three) times daily.    diphenhydrAMINE (BENADRYL) 25 MG tablet Take 25 mg by mouth every 6 (six) hours as needed for allergies.   Dulaglutide (TRULICITY) 9.32 IZ/1.2WP SOPN Inject 0.75 mg into the skin once a week. Managed by PCP   escitalopram (LEXAPRO) 10 MG tablet Take 10 mg by mouth at bedtime.    Homeopathic Products (LEG CRAMPS) TABS Take 1 tablet by mouth at bedtime.   HYDROcodone-acetaminophen (NORCO) 5-325 MG tablet 1-2 tabs po q6 hours prn pain   insulin lispro (HUMALOG KWIKPEN) 100 UNIT/ML KwikPen Inject 8 Units into the skin 3 (three) times daily.   lansoprazole (PREVACID) 15 MG capsule Take 15 mg by mouth at bedtime.    levocetirizine (XYZAL) 5 MG tablet Take 5 mg by mouth at bedtime as needed for allergies.   losartan (COZAAR) 100 MG tablet Take 100 mg by mouth at bedtime.   metFORMIN (GLUCOPHAGE) 500 MG tablet Take by mouth daily with breakfast.    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  Previously worked as a Theme park manager.   She is single, lives alone.  Used to exercise by walking frequently, but  with her knee arthritis, not walking as much.   Family History  Problem Relation Age of Onset   Alcohol abuse Father    Heart attack Sister 47    OBJCTIVE -PE, EKG, labs   Wt Readings from Last 3 Encounters:  12/30/20 202 lb 12.8 oz (92 kg)  03/21/20 197 lb (89.4 kg)  10/14/18 210 lb 1.6 oz (95.3 kg)    Physical Exam: BP (!) 148/88   Pulse 72   Ht 5' 5.5" (1.664 m)   Wt 202 lb 12.8 oz (92 kg)   SpO2 99%   BMI 33.23 kg/m  Physical Exam Vitals reviewed.  Constitutional:      General: She is not in acute distress (She had that brief 10-second episode where she was doubled over in pain just started talking to her, but then after that looked fine.).    Appearance: Normal appearance. She is obese. She is not ill-appearing or toxic-appearing.     Comments: Obese, well-groomed.  HENT:     Head: Normocephalic and atraumatic.  Neck:     Vascular: No carotid bruit or JVD.  Cardiovascular:     Rate and Rhythm: Normal rate and regular rhythm. No extrasystoles are present.    Chest Wall: PMI is not displaced.     Pulses: Normal pulses.     Heart sounds: S1 normal and S2 normal. Heart sounds are distant. No murmur heard.   No friction rub. No gallop.  Pulmonary:     Effort: Pulmonary effort is normal. No respiratory distress.     Breath sounds: Normal breath sounds. No wheezing, rhonchi or rales.  Chest:     Chest wall: Tenderness (Exquisite point tenderness along bilateral sternal margins from 2nd-4th ribs.) present.  Abdominal:     General: Abdomen is flat. Bowel sounds are normal. There is no distension.     Palpations: Abdomen is soft.     Tenderness: There is no abdominal tenderness.     Comments: Obese.  Difficult to assess HSM  Musculoskeletal:        General: Swelling (Trivial ankle swelling) and tenderness (Left knee is tender) present.     Cervical back: Normal range of motion and neck supple.  Neurological:     General: No focal deficit present.     Mental Status:  She is alert and oriented to person, place, and time.     Motor: No weakness.     Gait: Gait abnormal (Antalgic).  Psychiatric:        Mood and Affect: Mood normal.        Behavior: Behavior normal.        Thought Content: Thought content normal.        Judgment: Judgment normal.     Adult ECG Report  Rate: 72;  Rhythm: normal sinus rhythm, sinus arrhythmia, and normal axis, intervals and durations. ;   Narrative Interpretation: Stable  Recent Labs: Reviewed 09/21/2020: TC 170, TG 48, HDL 82, LDL 77.  A1c 8.0.  Hgb 12.3, BUN 25, Cr 1.15, K+ 4.0.  AST/ALT 16/15.  Alk phos 75 No results found for: CHOL, HDL, LDLCALC, LDLDIRECT, TRIG, CHOLHDL Lab Results  Component Value Date   CREATININE 1.50 (H) 03/21/2020   BUN 36 (H) 03/21/2020   NA 136 03/21/2020   K 4.5 03/21/2020  CL 102 03/21/2020   CO2 23 03/21/2020   CBC Latest Ref Rng & Units 03/21/2020 04/19/2018 07/24/2017  WBC 4.0 - 10.5 K/uL 3.8(L) 2.8(L) 3.2(L)  Hemoglobin 12.0 - 15.0 g/dL 12.4 12.8 11.9  Hematocrit 36.0 - 46.0 % 40.5 42.0 36.5  Platelets 150 - 400 K/uL 193 161 171    Lab Results  Component Value Date   HGBA1C 6.8 (H) 08/03/2014   No results found for: TSH  ==================================================  COVID-19 Education: The signs and symptoms of COVID-19 were discussed with the patient and how to seek care for testing (follow up with PCP or arrange E-visit).    I spent a total of 55 minutes with the patient spent in direct patient consultation.  Additional time spent with chart review  / charting (studies, outside notes, etc): 20 min Total Time: 75 min  Current medicines are reviewed at length with the patient today.  (+/- concerns) N/A  This visit occurred during the SARS-CoV-2 public health emergency.  Safety protocols were in place, including screening questions prior to the visit, additional usage of staff PPE, and extensive cleaning of exam room while observing appropriate contact time as  indicated for disinfecting solutions.  Notice: This dictation was prepared with Dragon dictation along with smart phrase technology. Any transcriptional errors that result from this process are unintentional and may not be corrected upon review.   Studies Ordered:  Orders Placed This Encounter  Procedures   CT CORONARY MORPH W/CTA COR W/SCORE W/CA W/CM &/OR WO/CM   Basic metabolic panel   EKG [id number]    Patient Instructions / Medication Changes & Studies & Tests Ordered   For CTA  Medication Sig   metoprolol tartrate (LOPRESSOR) 50 MG tablet Take 2 tablets (100 mg total) by mouth once for 1 dose. TAKE TWO HOURS PRIOR TO  SCHEDULE CARDIAC TEST    Patient Instructions  Medication Instructions:   No changes  *If you need a refill on your cardiac medications before your next appointment, please call your pharmacy*   Lab Work: BMP  If you have labs (blood work) drawn today and your tests are completely normal, you will receive your results only by: [place] (if you have MyChart) OR A paper copy in the mail If you have any lab test that is abnormal or we need to change your treatment, we will call you to review the results.   Testing/Procedures: Will be schedule at [facility] radiology dept.  Your physician has requested that you have cardiac CTA. Cardiac computed tomography (CT) is a painless test that uses an x-ray machine to take clear, detailed pictures of your heart. Please follow instruction sheet as given.     Follow-Up: At [facility], you and your health needs are our priority.  As part of our continuing mission to provide you with exceptional heart care, we have created designated Provider Care Teams.  These Care Teams include your primary Cardiologist (physician) and Advanced Practice Providers (APPs -  Physician Assistants and Nurse Practitioners) who all work together to provide you with the care you need, when you need it.     Your next appointment:    2 month(s)  The format for your next appointment:   In Person  Provider:   Glenetta Hew, MD   Other Instructions   Your cardiac CT will be scheduled at  the below location:   [facility] [address] [place], [place] [address] [phone]    Glenetta Hew, M.D.,  M.S. Interventional Cardiologist   Pager # [phone] Phone # [phone] [address] [place], [place] [address]   Thank you for choosing Heartcare at [facility]!!

## 2020-12-31 ENCOUNTER — Encounter: Payer: Self-pay | Admitting: Cardiology

## 2020-12-31 DIAGNOSIS — R9431 Abnormal electrocardiogram [ECG] [EKG]: Secondary | ICD-10-CM | POA: Insufficient documentation

## 2020-12-31 MED ORDER — TRULICITY 0.75 MG/0.5ML ~~LOC~~ SOAJ: 0.7500 mg | SUBCUTANEOUS | 11 refills | Status: AC

## 2020-12-31 MED ORDER — METOPROLOL TARTRATE 50 MG PO TABS: 100.0000 mg | ORAL_TABLET | Freq: Once | ORAL | 0 refills | Status: DC

## 2020-12-31 NOTE — Assessment & Plan Note (Addendum)
Pretty significantly elevated coronary calcium score in a patient with longstanding diabetes and hypertension.  With her having intermittent episodes of chest pain although they seem to be musculoskeletal in nature, in order to get a clear sense of preoperative risk, an ischemic evaluation is warranted simply because she cannot exercise due to knee pain.  Plan: Cardiac CT Angiogram with possible FFR CT to evaluate both anatomy and physiology.  (If there is significant amount of calcification, we may need to adjust and use a Myoview for ischemic evaluation once we have the initial angiography).  Anticipate being more aggressive with lipid management.  She is currently on atorvastatin 40 mg-Target LDL should probably be less than 70.  Pending results of coronary CTA, would likely consider adding ezetimibe.

## 2020-12-31 NOTE — Assessment & Plan Note (Signed)
Her blood pressure is pretty high today, but she admits to being quite anxious when she came in today.  She has not taken her losartan yet this morning.  We can reassess on follow-up visit.  Pending results on coronary CTA, may very well want to initiate beta-blocker such as carvedilol.

## 2020-12-31 NOTE — Assessment & Plan Note (Signed)
I suspect that some of her exertional dyspnea is related to deconditioning since her knees are bothering her, but in light of coronary calcium score being 2000, we do need to exclude ischemic CAD.  Plan: Cardiac CTA with possible CT FFR.  (Anticipate that we may not build to do CT FFR, and may need to convert to Myoview to complete the ischemic evaluation).

## 2020-12-31 NOTE — Assessment & Plan Note (Signed)
Her EKG actually is relatively normal.  There is low voltage in the precordial leads and " Q waves" in V1 and V2, but similar findings were noted in 2018.  Evaluated with an echocardiogram showing no evidence of anterior wall motion abnormality.  I would not consider this to be suggestive of prior MI.

## 2020-12-31 NOTE — Assessment & Plan Note (Addendum)
Total cholesterol 170 and LDL of 77 but HDL is 82.  With significant coronary artery calcification patient who has hypertension, diabetes and obesity-she is in the metabolic syndrome category already at high risk.  Goal LDL should be less than 70.  Anticipate rechecking lipid panel in follow-up, if LDL still above 70, would like to consider adding ezetimibe.  She is already on a GLP-1 agonist and metformin along with insulin.  A1c is 8.  Anticipate that she probably needs more aggressive control.

## 2020-12-31 NOTE — Assessment & Plan Note (Signed)
Her chest pain sounds very musculoskeletal symptoms and make her double over in pain, not exacerbated by exertion.  However with a significant elevated coronary calcium score ischemic evaluation is clearly indicated.  EKG suggest prior anterior infarct, age indeterminate.  However she has had similar EKGs for years, previously evaluated with an echocardiogram showing no anterior wall motion normality which would mean that this is simply related to body habitus.  Plan: Cardiac CTA for clarification purposes.

## 2021-01-12 ENCOUNTER — Telehealth (HOSPITAL_COMMUNITY): Payer: Self-pay | Admitting: *Deleted

## 2021-01-12 DIAGNOSIS — Z9884 Bariatric surgery status: Secondary | ICD-10-CM | POA: Diagnosis not present

## 2021-01-12 DIAGNOSIS — M8580 Other specified disorders of bone density and structure, unspecified site: Secondary | ICD-10-CM | POA: Diagnosis not present

## 2021-01-12 DIAGNOSIS — E113593 Type 2 diabetes mellitus with proliferative diabetic retinopathy without macular edema, bilateral: Secondary | ICD-10-CM | POA: Diagnosis not present

## 2021-01-12 DIAGNOSIS — R82998 Other abnormal findings in urine: Secondary | ICD-10-CM | POA: Diagnosis not present

## 2021-01-12 DIAGNOSIS — E1165 Type 2 diabetes mellitus with hyperglycemia: Secondary | ICD-10-CM | POA: Diagnosis not present

## 2021-01-12 DIAGNOSIS — Z794 Long term (current) use of insulin: Secondary | ICD-10-CM | POA: Diagnosis not present

## 2021-01-12 DIAGNOSIS — Z7984 Long term (current) use of oral hypoglycemic drugs: Secondary | ICD-10-CM | POA: Diagnosis not present

## 2021-01-12 DIAGNOSIS — Z23 Encounter for immunization: Secondary | ICD-10-CM | POA: Diagnosis not present

## 2021-01-12 NOTE — Telephone Encounter (Signed)
Attempted to call patient regarding upcoming cardiac CT appointment and remind patient regarding the need for blood work prior to appointment. Left message on voicemail with name and callback number  Larey Brick RN Navigator Cardiac Imaging [facility] [phone] Office [phone] Cell

## 2021-01-13 ENCOUNTER — Telehealth (HOSPITAL_COMMUNITY): Payer: Self-pay | Admitting: *Deleted

## 2021-01-13 NOTE — Telephone Encounter (Signed)
Reaching out to patient to ask her to get blood work prior to her cardiac imaging appointment.  Left message on voicemail with name and call back number.  Larey Brick, RN Nurse navigator Cardiac Imaging Office: [phone]

## 2021-01-13 NOTE — Telephone Encounter (Signed)
Reaching out to patient to offer assistance regarding upcoming cardiac imaging study; pt verbalizes understanding of appt date/time, parking situation and where to check in, pre-test NPO status and medications ordered, and verified current allergies; name and call back number provided for further questions should they arise ° °Amisha Pospisil RN Navigator Cardiac Imaging °[facility] °[phone] office °[phone] cell  ° °Patient to take 100mg metoprolol tartrate two hours prior to cardiac CT scan. °

## 2021-01-14 DIAGNOSIS — E113393 Type 2 diabetes mellitus with moderate nonproliferative diabetic retinopathy without macular edema, bilateral: Secondary | ICD-10-CM | POA: Diagnosis not present

## 2021-01-14 DIAGNOSIS — H401132 Primary open-angle glaucoma, bilateral, moderate stage: Secondary | ICD-10-CM | POA: Diagnosis not present

## 2021-01-17 ENCOUNTER — Ambulatory Visit (HOSPITAL_COMMUNITY)
Admission: RE | Admit: 2021-01-17 | Discharge: 2021-01-17 | Disposition: A | Discharge status: Home / Self Care | Payer: Medicare Other | Source: Ambulatory Visit | Attending: Cardiology | Admitting: Cardiology

## 2021-01-17 ENCOUNTER — Other Ambulatory Visit: Payer: Self-pay | Admitting: Cardiology

## 2021-01-17 ENCOUNTER — Other Ambulatory Visit: Payer: Self-pay

## 2021-01-17 DIAGNOSIS — E785 Hyperlipidemia, unspecified: Secondary | ICD-10-CM | POA: Diagnosis not present

## 2021-01-17 DIAGNOSIS — R079 Chest pain, unspecified: Secondary | ICD-10-CM | POA: Insufficient documentation

## 2021-01-17 DIAGNOSIS — R931 Abnormal findings on diagnostic imaging of heart and coronary circulation: Secondary | ICD-10-CM | POA: Insufficient documentation

## 2021-01-17 DIAGNOSIS — R072 Precordial pain: Secondary | ICD-10-CM | POA: Diagnosis not present

## 2021-01-17 DIAGNOSIS — E1169 Type 2 diabetes mellitus with other specified complication: Secondary | ICD-10-CM | POA: Diagnosis not present

## 2021-01-17 DIAGNOSIS — R0609 Other forms of dyspnea: Secondary | ICD-10-CM | POA: Insufficient documentation

## 2021-01-17 DIAGNOSIS — I2510 Atherosclerotic heart disease of native coronary artery without angina pectoris: Secondary | ICD-10-CM | POA: Diagnosis not present

## 2021-01-17 LAB — POCT I-STAT CREATININE: Creatinine, Ser: 1.2 mg/dL — ABNORMAL HIGH (ref 0.44–1.00)

## 2021-01-17 MED ORDER — NITROGLYCERIN 0.4 MG SL SUBL
SUBLINGUAL_TABLET | SUBLINGUAL | Status: AC
  Administered 2021-01-17: 0.8 mg via SUBLINGUAL
  Filled 2021-01-17: qty 2

## 2021-01-17 MED ORDER — IOHEXOL 350 MG/ML SOLN
80.0000 mL | Freq: Once | INTRAVENOUS | Status: AC | PRN
  Administered 2021-01-17: 80 mL via INTRAVENOUS

## 2021-01-17 MED ORDER — NITROGLYCERIN 0.4 MG SL SUBL: 0.8000 mg | SUBLINGUAL_TABLET | Freq: Once | SUBLINGUAL | Status: AC

## 2021-01-18 ENCOUNTER — Ambulatory Visit (HOSPITAL_COMMUNITY)
Admission: RE | Admit: 2021-01-18 | Discharge: 2021-01-18 | Disposition: A | Discharge status: Home / Self Care | Payer: Medicare Other | Source: Ambulatory Visit | Attending: Cardiology | Admitting: Cardiology

## 2021-01-18 DIAGNOSIS — R931 Abnormal findings on diagnostic imaging of heart and coronary circulation: Secondary | ICD-10-CM | POA: Diagnosis not present

## 2021-01-18 DIAGNOSIS — I2510 Atherosclerotic heart disease of native coronary artery without angina pectoris: Secondary | ICD-10-CM | POA: Diagnosis not present

## 2021-01-21 ENCOUNTER — Telehealth: Payer: Self-pay | Admitting: Cardiology

## 2021-01-21 NOTE — Telephone Encounter (Signed)
Patient calling for CT results. 

## 2021-01-21 NOTE — Telephone Encounter (Signed)
Pt aware of CTA results and is very SOB all the time Will forward to Dr Herbie Baltimore for review and recommendations./c

## 2021-01-21 NOTE — Telephone Encounter (Signed)
Karen Lex, MD  01/19/2021  6:46 PM EDT     CT FFR results do confirm significant disease in the RCA and possibly the circumflex.   Waiting to reassess her symptoms and determine if we need to consider cardiac catheterization based on recommendations.   Bryan Lemma, MD .

## 2021-01-22 NOTE — Telephone Encounter (Signed)
It would be nice to have her seen to discuss neck step after coronary CTA which is cardiac catheterization.  I would like to go discussed the heart catheterization procedure with her as well as the risks benefits and alternatives/indications.  She should see either me in person or via telehealth &  if not - pls have her see an APP.  Bryan Lemma, MD

## 2021-01-24 NOTE — Telephone Encounter (Signed)
Pt has appt on 01/26/21 at 3:40 pm with Dr Herbie Baltimore .Zack Seal

## 2021-01-26 ENCOUNTER — Ambulatory Visit (INDEPENDENT_AMBULATORY_CARE_PROVIDER_SITE_OTHER): Payer: Medicare Other | Admitting: Cardiology

## 2021-01-26 ENCOUNTER — Other Ambulatory Visit: Payer: Self-pay

## 2021-01-26 ENCOUNTER — Encounter: Payer: Self-pay | Admitting: Cardiology

## 2021-01-26 VITALS — BP 138/85 | HR 76 | Ht 65.5 in | Wt 202.0 lb

## 2021-01-26 DIAGNOSIS — R072 Precordial pain: Secondary | ICD-10-CM | POA: Diagnosis not present

## 2021-01-26 DIAGNOSIS — I10 Essential (primary) hypertension: Secondary | ICD-10-CM

## 2021-01-26 DIAGNOSIS — E785 Hyperlipidemia, unspecified: Secondary | ICD-10-CM | POA: Diagnosis not present

## 2021-01-26 DIAGNOSIS — R9431 Abnormal electrocardiogram [ECG] [EKG]: Secondary | ICD-10-CM

## 2021-01-26 DIAGNOSIS — R0609 Other forms of dyspnea: Secondary | ICD-10-CM

## 2021-01-26 DIAGNOSIS — E1169 Type 2 diabetes mellitus with other specified complication: Secondary | ICD-10-CM

## 2021-01-26 DIAGNOSIS — R931 Abnormal findings on diagnostic imaging of heart and coronary circulation: Secondary | ICD-10-CM

## 2021-01-26 NOTE — Progress Notes (Signed)
Primary Care Provider: Leeroy Cha, MD [facility] Cardiologist: None.  Previously seen by Dr. Percival Spanish for syncope in 2018 Electrophysiologist: None Orthopedic Surgeon: Dr. Helene Kelp orthopedics  Clinic Note: Chief Complaint  Patient presents with   Follow-up    Test results-abnormal coronary CTA   ===================================  ASSESSMENT/PLAN   Problem List Items Addressed This Visit       Cardiology Problems   Essential hypertension (Chronic)    Borderline blood pressure today on current dose of ARB.  Anticipate initiating carvedilol post cath.      Hyperlipidemia associated with type 2 diabetes mellitus ([place]) (Chronic)    Based on severely elevated coronary calcium score, will need to be more aggressive with lipid management.  Anticipate post cath titration either to 80 mg atorvastatin or switching to statin 40 mg.  I suspect that she may need aggressive therapy as well.  With target LDL now close to 55.   Dose adjustment timing of her  diabetes medication/insulin discussed as part of preoperative recommendations.        Other   Precordial pain    Certainly a couple of her chest pain sounds musculoskeletal, in light of her abnormal coronary CTA, we have to consider the possibility of ischemic CAD.  Plan: Invasive cardiac evaluation with Left Heart Cath and Coronary Angiography      DOE (dyspnea on exertion)    I still think the exertional dyspnea is probably deconditioning.  It is possible that she has a CTO of the RCA, if so, this could be contributing. With suggestion of occluded RCA, we are proceeding with cardiac catheterization for definitive evaluation.      Relevant Orders   Basic metabolic panel (Completed)   CBC (Completed)   Nonspecific abnormal electrocardiogram (ECG) (EKG)    Interestingly, she has had longstanding "Q waves "in precordial leads which would suggest prior anterior infarct as opposed inferior related to  RCA.  Likely nonspecific findings.      Abnormal cardiac CT angiography - Primary (Chronic)    Interestingly, she is not really symptomatic, but has had some symptoms and abnormal EKG.  Especially in light of her potential knee surgery, I think we need to answer the question about ischemic CAD.  If she does have occluded RCA with collateralization, the plan will be aggressive medical management.  Plan: We will schedule Left Heart Catheterization with Coronary Angiography and Possible PCI for next week. Recommend starting aspirin, continuing statin at current dose although may need to titrate further. => Either convert from atorvastatin to rosuvastatin or increase to 80 mg based on Results. On ARB, anticipate starting low-dose beta-blocker. Not on any antianginal medications -- have room for Beta Blocker +/- Calcium Channel Blocker.  Shared Decision Making/Informed Consent The risks [stroke (1 in 1000), death (1 in 1000), kidney failure [usually temporary] (1 in 500), bleeding (1 in 200), allergic reaction [possibly serious] (1 in 200)], benefits (diagnostic support and management of coronary artery disease) and alternatives of a cardiac catheterization were discussed in detail with Ms. Alfonzo Beers and she is willing to proceed.       Other Visit Diagnoses     Abnormal findings diagnostic imaging of heart and coronary circulation       Relevant Orders   Basic metabolic panel (Completed)   CBC (Completed)      ===================================  HPI:    Karen Gardner is a 65 y.o. female with a PMH notable for DM-2 (complicated by Diabetic Retinopathy), HTN and h/o Vasovagal  Syncope (December '21 -> was standing at [employer], began feeling lightheaded with tunnel vision then woke up on the floor with people around her-> reportedly had been at least 3-4 previous episodes.) who is being Seen Today for Follow-Up of Coronary CT Angiogram.   on October 22, 2020 by Dr. presumably  for surgical clearance for potential knee surgery.  There was concerned about EKG showing old anteroseptal infarct.  There was discussion about knee replacement by Dr. Justine Null 1 from [place] on August 7.  (According to the patient, she never agreed to that.)  She was noting some dyspnea on exertion at that time, and PCP ordered coronary calcium score and referred to cardiology for stress testing.  Nyla Creason Glaze was seen for initial consultation on September 29 at the request of Dr. Fara Olden in response to an Elevated Coronary Calcium Score and an abnormal EKG suggesting possible prior anteroseptal infarct.  There was discussion about possible knee replacement by Dr. Justine Null, and PCP ordered a coronary calcium score for cardiovascular evaluation.  When the coronary score is elevated, will refer to cardiology for ischemic evaluation. --> During this initial consultation, she indicated that she was not to have any surgery.   --> She indicated that she has had intermittent episodes of severe sharp central chest pain episodes that can happen with or without exertion (and not exacerbated by exertion.  They usually last seconds to a minute, she was given previous diagnosis of Costochondritis.   --> She does have some exertional dyspnea because of deconditioning but has not had any exertional chest pain. --> She also noted episodic palpitations but nothing prolonged. Because of chest pain and exertional dyspnea with abnormal EKG and elevated Coronary Calcium Score, we will order a Coronary CT Angiogram reviewed below.  Recent Hospitalizations:  None  Reviewed  CV studies:    The following studies were reviewed today: (if available, images/films reviewed: From Epic Chart or Care Everywhere) Coronary CT Angiogram 01/17/2021:  Coronary calcium score 2437 (99th percentile).  Moderate and possibly severe coronary artery atherosclerosis.  Significant blooming artifact in all 3 epicardial vessels.  CAD  RADS 3-4.  Recommend Cardiac Catheterization. FFRCT :  LM -0.99; (p-m-d) LAD  0.97, 0.93, 0.87; LCx 0.9, 0.96, 0.82.; RI 0.98, 0.86, 0.82.RCA 0.99,? mid Occluded - However there does appear to be some distal flow in the RCA suggesting possible collateral flow. Cardiac Catheterization Recommended   Interval History:   GLADYS DECKARD is here today to discuss Cor CTA results.  She still really has not noticed any significant symptoms of chest pain pressure or dyspnea with rest or exertion.  She does have those weird chest pain episodes off and on and does have exertional dyspnea however. She does acknowledge that she is not very active due to her continued knee pain.  Cardiovascular ROS: positive for - chest pain, dyspnea on exertion, irregular heartbeat, palpitations, and Although chest pain is not necessarily associated with exertion. negative for - edema, orthopnea, paroxysmal nocturnal dyspnea, rapid heart rate, shortness of breath, or syncope/near syncope or TIA/#V. tach, claudication   REVIEWED OF SYSTEMS   Review of Systems  Constitutional:  Positive for malaise/fatigue (Exercise intolerance,-getting "lazy "because of knee pain.). Negative for weight loss (Weight gain since she has been limited in activity by knee pain).  HENT:  Positive for congestion (Only with seasonal allergies.). Negative for nosebleeds.   Respiratory:  Positive for cough (Seasonal allergies-nonproductive.) and shortness of breath (Exertional-related to deconditioning). Negative for wheezing (  Sometimes when she has bad allergies she wheezes but not routinely.).   Cardiovascular:  Chest pain: Chest pain episodes as noted above.. Orthopnea: She sleeps on 3 pillows more for comfort than dyspnea..       Per HPI  Gastrointestinal:  Negative for abdominal pain, blood in stool and melena.  Genitourinary:  Negative for hematuria.  Musculoskeletal:  Positive for joint pain (Limited by left knee pain.  She is already had a  right knee operated on). Negative for falls.  Neurological:  Negative for dizziness, focal weakness and headaches.  Endo/Heme/Allergies:  Positive for environmental allergies.  Psychiatric/Behavioral:  Negative for depression and memory loss. The patient is not nervous/anxious and does not have insomnia.    I have reviewed and (if needed) personally updated the patient's problem list, medications, allergies, past medical and surgical history, social and family history.   PAST MEDICAL HISTORY   Past Medical History:  Diagnosis Date   Abnormal cardiac CT angiography 11/16/2020   11/16/2020: CORONARY CA++ SCORE: Agatston Score 2245.  LAD 1093, LCx 959, RCA 193 (99th percentile) -> COR CTA (Agatston 2437) -moderate to severe multivessel CAD with significant blooming in all 3 epicardial vessels.  CAD RADS 3-4. FFRCT suggests mid RCA occlusion, but imaging suggests possible collateralization with patent flow distally. _.  Recommended Cardiac Catheterization   Allergic rhinitis    Anxiety    Arthritis    Asthma    childhood   Chronic low back pain    CKD (chronic kidney disease), stage III ([place])    However, most recent creatinine 1.5.   Depression    With anxiety   GERD (gastroesophageal reflux disease)    Headache    sinus headaches    Hypercholesterolemia    Hypertension    Iron deficiency anemia 05/26/2015   Has required transfusions-followed by Dr. Marin Olp   Iron malabsorption 05/26/2015   Menopause    Numbness in both hands    mostly at night   Obesity    Osteoarthritis of both knees    Status post right TKA (and ankle) with plans for left TKA   Type 2 diabetes mellitus without complication, with long-term current use of insulin (HCC)    Type 2 on insulin pump   Vasovagal syncope 2018   Negative work-up   Vitamin D deficiency     PAST SURGICAL HISTORY   Past Surgical History:  Procedure Laterality Date   COLONOSCOPY     EYE SURGERY Bilateral    [facility]    HERNIA REPAIR      umb hernia as child   KNEE ARTHROSCOPY  02/12/2012   Procedure: ARTHROSCOPY KNEE;  Surgeon: Kerin Salen, MD;  Location: [facility];  Service: Orthopedics;  Laterality: Right;  Partial Lateral Meniscectomy, Debridement chondromalacia   LAPAROSCOPIC GASTRIC SLEEVE RESECTION N/A 03/14/2016   Procedure: LAPAROSCOPIC GASTRIC SLEEVE RESECTION, WITH REPAIR OF HIATAL HERNIA REPAI, AND UPPER ENDO;  Surgeon: Johnathan Hausen, MD;  Location: [facility];  Service: General;  Laterality: N/A;   ORIF ANKLE FRACTURE Right 02/11/2014   Procedure: OPEN REDUCTION INTERNAL FIXATION (ORIF) RIGHT ANKLE FRACTURE;  Surgeon: Kerin Salen, MD;  Location: [facility];  Service: Orthopedics;  Laterality: Right;   TOTAL KNEE ARTHROPLASTY Right 08/05/2014   Procedure: TOTAL KNEE ARTHROPLASTY;  Surgeon: Frederik Pear, MD;  Location: [facility];  Service: Orthopedics;  Laterality: Right;   TRANSTHORACIC ECHOCARDIOGRAM  05/2016   EF 60 to 65%.  Severe basal septal LVH with mild concentric LVH.  No R WMA.  GR 1 DD.  Indeterminate filling pressures. Minimal valve disease.   TRIGGER FINGER RELEASE Right 10/14/2018   Procedure: RELEASE TRIGGER FINGER/A-1 PULLEY;  Surgeon: Leanora Cover, MD;  Location: [facility];  Service: Orthopedics;  Laterality: Right;   UPPER GI ENDOSCOPY      Immunization History  Administered Date(s) Administered   Influenza-Unspecified 01/07/2014, 12/03/2014   PPD Test 02/14/2014   Pneumococcal-Unspecified 02/14/2014    MEDICATIONS/ALLERGIES   Current Meds  Medication Sig   atorvastatin (LIPITOR) 40 MG tablet Take 40 mg by mouth at bedtime.    CALCIUM PO Take 1 capsule by mouth 3 (three) times daily.    diphenhydrAMINE (BENADRYL) 25 MG tablet Take 25 mg by mouth every 6 (six) hours as needed for allergies.   Dulaglutide (TRULICITY) 4.09 WJ/1.9JY SOPN Inject 0.75 mg into the skin once a week. Managed by PCP   escitalopram (LEXAPRO) 10 MG tablet Take 10 mg by mouth at bedtime.     Homeopathic Products (LEG CRAMPS) TABS Take 1 tablet by mouth at bedtime.   HYDROcodone-acetaminophen (NORCO) 5-325 MG tablet 1-2 tabs po q6 hours prn pain   insulin lispro (HUMALOG) 100 UNIT/ML KwikPen Inject 8 Units into the skin 3 (three) times daily.   lansoprazole (PREVACID) 15 MG capsule Take 15 mg by mouth at bedtime.    levocetirizine (XYZAL) 5 MG tablet Take 5 mg by mouth at bedtime as needed for allergies.   losartan (COZAAR) 100 MG tablet Take 100 mg by mouth at bedtime.   metFORMIN (GLUCOPHAGE) 500 MG tablet Take by mouth daily with breakfast.   Multiple Vitamin (MULTIVITAMIN WITH MINERALS) TABS tablet Take 1 tablet by mouth 2 (two) times daily.   ONETOUCH DELICA LANCETS FINE MISC Use to check blood sugar once a day dx code E11.65   OVER THE COUNTER MEDICATION Take 1 tablet by mouth at bedtime. 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  Previously worked as a Theme park manager.   She is single, lives alone.  Used to exercise by walking frequently, but with her knee arthritis, not walking as much.   Family History  Problem Relation Age of Onset   Alcohol abuse Father    Heart attack Sister 74    OBJCTIVE -PE, EKG, labs   Wt Readings from Last 3 Encounters:  01/26/21 202 lb (91.6 kg)  12/30/20 202 lb 12.8 oz (92 kg)  03/21/20 197 lb (89.4 kg)    Physical Exam: BP 138/85 (BP  Location: Left Arm)   Pulse 76   Ht 5' 5.5" (1.664 m)   Wt 202 lb (91.6 kg)   SpO2 97%   BMI 33.10 kg/m  Physical Exam Vitals reviewed.  Constitutional:  General: She is not in acute distress (She had that brief 10-second episode where she was doubled over in pain just started talking to her, but then after that looked fine.).    Appearance: Normal appearance. She is obese. She is not ill-appearing (Healthy-appearing) or toxic-appearing.     Comments: Mildly obese.  Was well-nourished and well-groomed.  HENT:     Head: Normocephalic and atraumatic.  Neck:     Vascular: No carotid bruit, hepatojugular reflux or JVD.  Cardiovascular:     Rate and Rhythm: Normal rate and regular rhythm. No extrasystoles are present.    Chest Wall: PMI is not displaced.     Pulses: Normal pulses.     Heart sounds: S1 normal and S2 normal. Heart sounds are distant. No murmur heard.   No friction rub. No gallop.  Pulmonary:     Effort: Pulmonary effort is normal. No respiratory distress.     Breath sounds: Normal breath sounds. No wheezing, rhonchi or rales.  Chest:     Chest wall: Tenderness (She still has point tenderness on [place] sternal border on the right and left.) present.  Abdominal:     Comments: Obese.  Difficult to assess HSM  Musculoskeletal:        General: Swelling (Trivial bilateral ankle) and tenderness (Left knee is tender) present.     Cervical back: Normal range of motion and neck supple.  Neurological:     General: No focal deficit present.     Mental Status: She is alert and oriented to person, place, and time.     Motor: No weakness.     Gait: Gait abnormal (Antalgic).  Psychiatric:        Mood and Affect: Mood normal.        Behavior: Behavior normal.        Thought Content: Thought content normal.        Judgment: Judgment normal.     Adult ECG Report  Rate: 72;  Rhythm: normal sinus rhythm, sinus arrhythmia, and normal axis, intervals and durations. ;    Narrative Interpretation: Stable  Recent Labs: Reviewed 09/21/2020: TC 170, TG 48, HDL 82, LDL 77.  A1c 8.0.  Hgb 12.3, BUN 25, Cr 1.15, K+ 4.0.  AST/ALT 16/15.  Alk phos 75 01/12/2021-A1c 7.1 (notably improved). Cr 1.2 No results found for: CHOL, HDL, LDLCALC, LDLDIRECT, TRIG, CHOLHDL Lab Results  Component Value Date   CREATININE 1.12 (H) 01/27/2021   BUN 23 01/27/2021   NA 135 01/27/2021   K 4.2 01/27/2021   CL 100 01/27/2021   CO2 24 01/27/2021   CBC Latest Ref Rng & Units 01/27/2021 03/21/2020 04/19/2018  WBC 3.4 - 10.8 x10E3/uL 3.2(L) 3.8(L) 2.8(L)  Hemoglobin 11.1 - 15.9 g/dL 12.3 12.4 12.8  Hematocrit 34.0 - 46.6 % 39.4 40.5 42.0  Platelets 150 - 450 x10E3/uL 200 193 161    Lab Results  Component Value Date   HGBA1C 6.8 (H) 08/03/2014   No results found for: TSH  ==================================================  COVID-19 Education: The signs and symptoms of COVID-19 were discussed with the patient and how to seek care for testing (follow up with PCP or arrange E-visit).    I spent a total of 45  minutes with the patient spent in direct patient consultation.  Additional time spent with chart review  / charting (studies, outside notes, etc): 20 min Total Time: 65 min  Current medicines are reviewed at length with the patient today.  (+/- concerns) N/A  This visit  occurred during the SARS-CoV-2 public health emergency.  Safety protocols were in place, including screening questions prior to the visit, additional usage of staff PPE, and extensive cleaning of exam room while observing appropriate contact time as indicated for disinfecting solutions.  Notice: This dictation was prepared with Dragon dictation along with smart phrase technology. Any transcriptional errors that result from this process are unintentional and may not be corrected upon review.   Studies Ordered:  Orders Placed This Encounter  Procedures   Basic metabolic panel   CBC    Patient  Instructions / Medication Changes & Studies & Tests Ordered   Patient Instructions  Medication Instructions:   No changes  *If you need a refill on your cardiac medications before your next appointment, please call your pharmacy*   Lab Work: Cbc Bmp  If you have labs (blood work) drawn today and your tests are completely normal, you will receive your results only by: MyChart Message (if you have MyChart) OR A paper copy in the mail If you have any lab test that is abnormal or we need to change your treatment, we will call you to review the results.   Testing/Procedures:  Will be done Feb 02, 2021 at [facility] has requested that you have a cardiac catheterization. Cardiac catheterization is used to diagnose and/or treat various heart conditions. Doctors may recommend this procedure for a number of different reasons. The most common reason is to evaluate chest pain. Chest pain can be a symptom of coronary artery disease (CAD), and cardiac catheterization can show whether plaque is narrowing or blocking your heart's arteries. This procedure is also used to evaluate the valves, as well as measure the blood flow and oxygen levels in different parts of your heart. For further information please visit [email or web address].[email or web address]. Please follow instruction sheet, as given.    Follow-Up: At [facility], you and your health needs are our priority.  As part of our continuing mission to provide you with exceptional heart care, we have created designated Provider Care Teams.  These Care Teams include your primary Cardiologist (physician) and Advanced Practice Providers (APPs -  Physician Assistants and Nurse Practitioners) who all work together to provide you with the care you need, when you need it.     Your next appointment:   2 month(s) Keep appt for Mar 03, 2021  The format for your next appointment:   In Person  Provider:   Glenetta Hew, MD   Other Instructions    [place] [address] Dept: [phone] Loc: [facility]  01/26/2021  You are scheduled for a Cardiac Catheterization on Wednesday, November 2 with Dr. Glenetta Hew.  1. Please arrive at the [facility] (Main Entrance A) at [facility]: [address] [place],  [address] at 6:30 AM (This time is two hours before your procedure to ensure your preparation). Free valet parking service is available.   Special note: Every effort is made to have your procedure done on time. Please understand that emergencies sometimes delay scheduled procedures.  2. Diet: Do not eat solid foods after midnight.  The patient may have clear liquids until 5am upon the day of the procedure.  3. Labs: You will need to have blood drawn on  Thursday Oct 27 or Friday, October 28 at [address], [place]  Open: 8am - 5pm (Lunch 12:30 -  1:30)   Phone: [phone]. You do not need to be fasting.  4. Medication instructions in preparation for your procedure:   Contrast Allergy: No    Do not take the morning of the procedure  losartan (COZAAR) 100 MG tablet, Take 100 mg by mouth at bedtime.    Take your dose  as usual Saturday   Dulaglutide (TRULICITY) 7.74 JO/8.7OM SOPN, Inject 0.75 mg into the skin once a week.      insulin lispro (HUMALOG) 100 UNIT/ML KwikPen, Inject 8 Units into the skin 3 (three) times daily. Take 4 units the evening before and do not  take the morning of procedures     metFORMIN (GLUCOPHAGE) 500 MG tablet, Take by mouth daily with breakfast. - take the morning of procedure but do not take for 48 hours after procedure.  On the morning of your procedure, take your Aspirin and any morning medicines NOT listed above.  You may use sips of water.  5. Plan for one night stay--bring personal belongings. 6. Bring a current list of  your medications and current insurance cards. 7. You MUST have a responsible person to drive you home. 8. Someone MUST be with you the first 24 hours after you arrive home or your discharge will be delayed. 9. Please wear clothes that are easy to get on and off and wear slip-on shoes.  Thank you for allowing [place] to care for you!   --  Invasive Cardiovascular services     Glenetta Hew, M.D., M.S. Interventional Cardiologist   Pager # [phone] Phone # [phone] [address] [place], [place] [address]   Thank you for choosing Heartcare at [facility]!!

## 2021-01-26 NOTE — Patient Instructions (Signed)
Medication Instructions:   No changes  *If you need a refill on your cardiac medications before your next appointment, please call your pharmacy*   Lab Work: Cbc Bmp  If you have labs (blood work) drawn today and your tests are completely normal, you will receive your results only by: MyChart Message (if you have MyChart) OR A paper copy in the mail If you have any lab test that is abnormal or we need to change your treatment, we will call you to review the results.   Testing/Procedures:  Will be done Feb 02, 2021 at [facility] Your physician has requested that you have a cardiac catheterization. Cardiac catheterization is used to diagnose and/or treat various heart conditions. Doctors may recommend this procedure for a number of different reasons. The most common reason is to evaluate chest pain. Chest pain can be a symptom of coronary artery disease (CAD), and cardiac catheterization can show whether plaque is narrowing or blocking your heart's arteries. This procedure is also used to evaluate the valves, as well as measure the blood flow and oxygen levels in different parts of your heart. For further information please visit [email or web address] Please follow instruction sheet, as given.    Follow-Up: At [facility], you and your health needs are our priority.  As part of our continuing mission to provide you with exceptional heart care, we have created designated Provider Care Teams.  These Care Teams include your primary Cardiologist (physician) and Advanced Practice Providers (APPs -  Physician Assistants and Nurse Practitioners) who all work together to provide you with the care you need, when you need it.     Your next appointment:   2 month(s) Keep appt for Mar 03, 2021  The format for your next appointment:   In Person  Provider:   Bryan Lemma, MD   Other Instructions    [facility] CARDIOVASCULAR DIVISION [facility] [address] [place] [address] [place] [address] Dept: [phone] Loc: [phone]  Karen Gardner  01/26/2021  You are scheduled for a Cardiac Catheterization on Wednesday, November 2 with Dr. Bryan Lemma.  1. Please arrive at the [facility] (Main Entrance A) at [facility]: [address] [place], [place] [address] at 6:30 AM (This time is two hours before your procedure to ensure your preparation). Free valet parking service is available.   Special note: Every effort is made to have your procedure done on time. Please understand that emergencies sometimes delay scheduled procedures.  2. Diet: Do not eat solid foods after midnight.  The patient may have clear liquids until 5am upon the day of the procedure.  3. Labs: You will need to have blood drawn on  Thursday Oct 27 or Friday, October 28 at [facility] [address], [place]  Open: 8am - 5pm (Lunch 12:30 - 1:30)   Phone: [phone]. You do not need to be fasting.  4. Medication instructions in preparation for your procedure:   Contrast Allergy: No    Do not take the morning of the procedure  losartan (COZAAR) 100 MG tablet, Take 100 mg by mouth at bedtime.    Take your dose  as usual Saturday   Dulaglutide (TRULICITY) 0.75 MG/0.5ML SOPN, Inject 0.75 mg into the skin once a week.      insulin lispro (HUMALOG) 100 UNIT/ML KwikPen, Inject 8 Units into the skin 3 (three) times daily. Take 4 units the evening before and do not  take the  morning of procedures     metFORMIN (GLUCOPHAGE) 500 MG tablet, Take by mouth daily with breakfast. - take the morning of procedure but do not take for 48 hours after procedure.  On the morning of your procedure, take your Aspirin and any morning medicines NOT listed above.  You may use sips of water.  5. Plan for one night stay--bring personal belongings. 6. Bring a current list of your medications and current insurance cards. 7. You MUST have a responsible person to  drive you home. 8. Someone MUST be with you the first 24 hours after you arrive home or your discharge will be delayed. 9. Please wear clothes that are easy to get on and off and wear slip-on shoes.  Thank you for allowing [place] to care for you!   -- [facility]

## 2021-01-26 NOTE — H&P (View-Only) (Signed)
 Primary Care Provider: Varadarajan, Rupashree, MD [facility] Cardiologist: None.  Previously seen by Dr. Hochrein for syncope in 2018 Electrophysiologist: None Orthopedic Surgeon: Dr. Rowe-[facility]  Clinic Note: Chief Complaint  Patient presents with   Follow-up    Test results-abnormal coronary CTA   ===================================  ASSESSMENT/PLAN   Problem List Items Addressed This Visit       Cardiology Problems   Essential hypertension (Chronic)    Borderline blood pressure today on current dose of ARB.  Anticipate initiating carvedilol post cath.      Hyperlipidemia associated with type 2 diabetes mellitus (HCC) (Chronic)    Based on severely elevated coronary calcium score, will need to be more aggressive with lipid management.  Anticipate post cath titration either to 80 mg atorvastatin or switching to statin 40 mg.  I suspect that she may need aggressive therapy as well.  With target LDL now close to 55.   Dose adjustment timing of her  diabetes medication/insulin discussed as part of preoperative recommendations.        Other   Precordial pain    Certainly a couple of her chest pain sounds musculoskeletal, in light of her abnormal coronary CTA, we have to consider the possibility of ischemic CAD.  Plan: Invasive cardiac evaluation with Left Heart Cath and Coronary Angiography      DOE (dyspnea on exertion)    I still think the exertional dyspnea is probably deconditioning.  It is possible that she has a CTO of the RCA, if so, this could be contributing. With suggestion of occluded RCA, we are proceeding with cardiac catheterization for definitive evaluation.      Relevant Orders   Basic metabolic panel (Completed)   CBC (Completed)   Nonspecific abnormal electrocardiogram (ECG) (EKG)    Interestingly, she has had longstanding "Q waves "in precordial leads which would suggest prior anterior infarct as opposed inferior related to  RCA.  Likely nonspecific findings.      Abnormal cardiac CT angiography - Primary (Chronic)    Interestingly, she is not really symptomatic, but has had some symptoms and abnormal EKG.  Especially in light of her potential knee surgery, I think we need to answer the question about ischemic CAD.  If she does have occluded RCA with collateralization, the plan will be aggressive medical management.  Plan: We will schedule Left Heart Catheterization with Coronary Angiography and Possible PCI for next week. Recommend starting aspirin, continuing statin at current dose although may need to titrate further. => Either convert from atorvastatin to rosuvastatin or increase to 80 mg based on Results. On ARB, anticipate starting low-dose beta-blocker. Not on any antianginal medications -- have room for Beta Blocker +/- Calcium Channel Blocker.  Shared Decision Making/Informed Consent The risks [stroke (1 in 1000), death (1 in 1000), kidney failure [usually temporary] (1 in 500), bleeding (1 in 200), allergic reaction [possibly serious] (1 in 200)], benefits (diagnostic support and management of coronary artery disease) and alternatives of a cardiac catheterization were discussed in detail with Karen Gardner and she is willing to proceed.       Other Visit Diagnoses     Abnormal findings diagnostic imaging of heart and coronary circulation       Relevant Orders   Basic metabolic panel (Completed)   CBC (Completed)      ===================================  HPI:    Karen Gardner is a 65 y.o. female with a PMH notable for DM-2 (complicated by Diabetic Retinopathy), HTN and h/o Vasovagal   Syncope (December '21 -> was standing at the church preaching, began feeling lightheaded with tunnel vision then woke up on the floor with people around her-> reportedly had been at least 3-4 previous episodes.) who is being Seen Today for Follow-Up of Coronary CT Angiogram.   on October 22, 2020 by Dr. presumably  for surgical clearance for potential knee surgery.  There was concerned about EKG showing old anteroseptal infarct.  There was discussion about knee replacement by Dr. Rowe 1 from [facility] on August 7.  (According to the patient, she never agreed to that.)  She was noting some dyspnea on exertion at that time, and PCP ordered coronary calcium score and referred to cardiology for stress testing.  Karen Gardner was seen for initial consultation on September 29 at the request of Dr. Varadarajan in response to an Elevated Coronary Calcium Score and an abnormal EKG suggesting possible prior anteroseptal infarct.  There was discussion about possible knee replacement by Dr. Rowe, and PCP ordered a coronary calcium score for cardiovascular evaluation.  When the coronary score is elevated, will refer to cardiology for ischemic evaluation. --> During this initial consultation, she indicated that she was not to have any surgery.   --> She indicated that she has had intermittent episodes of severe sharp central chest pain episodes that can happen with or without exertion (and not exacerbated by exertion.  They usually last seconds to a minute, she was given previous diagnosis of Costochondritis.   --> She does have some exertional dyspnea because of deconditioning but has not had any exertional chest pain. --> She also noted episodic palpitations but nothing prolonged. Because of chest pain and exertional dyspnea with abnormal EKG and elevated Coronary Calcium Score, we will order a Coronary CT Angiogram reviewed below.  Recent Hospitalizations:  None  Reviewed  CV studies:    The following studies were reviewed today: (if available, images/films reviewed: From Epic Chart or Care Everywhere) Coronary CT Angiogram 01/17/2021:  Coronary calcium score 2437 (99th percentile).  Moderate and possibly severe coronary artery atherosclerosis.  Significant blooming artifact in all 3 epicardial vessels.  CAD  RADS 3-4.  Recommend Cardiac Catheterization. FFRCT :  LM -0.99; (p-m-d) LAD  0.97, 0.93, 0.87; LCx 0.9, 0.96, 0.82.; RI 0.98, 0.86, 0.82.RCA 0.99,? mid Occluded - However there does appear to be some distal flow in the RCA suggesting possible collateral flow. Cardiac Catheterization Recommended   Interval History:   Karen Gardner is here today to discuss Cor CTA results.  She still really has not noticed any significant symptoms of chest pain pressure or dyspnea with rest or exertion.  She does have those weird chest pain episodes off and on and does have exertional dyspnea however. She does acknowledge that she is not very active due to her continued knee pain.  Cardiovascular ROS: positive for - chest pain, dyspnea on exertion, irregular heartbeat, palpitations, and Although chest pain is not necessarily associated with exertion. negative for - edema, orthopnea, paroxysmal nocturnal dyspnea, rapid heart rate, shortness of breath, or syncope/near syncope or TIA/#V. tach, claudication   REVIEWED OF SYSTEMS   Review of Systems  Constitutional:  Positive for malaise/fatigue (Exercise intolerance,-getting "lazy "because of knee pain.). Negative for weight loss (Weight gain since she has been limited in activity by knee pain).  HENT:  Positive for congestion (Only with seasonal allergies.). Negative for nosebleeds.   Respiratory:  Positive for cough (Seasonal allergies-nonproductive.) and shortness of breath (Exertional-related to deconditioning). Negative for wheezing (  Sometimes when she has bad allergies she wheezes but not routinely.).   Cardiovascular:  Chest pain: Chest pain episodes as noted above.. Orthopnea: She sleeps on 3 pillows more for comfort than dyspnea..       Per HPI  Gastrointestinal:  Negative for abdominal pain, blood in stool and melena.  Genitourinary:  Negative for hematuria.  Musculoskeletal:  Positive for joint pain (Limited by left knee pain.  She is already had a  right knee operated on). Negative for falls.  Neurological:  Negative for dizziness, focal weakness and headaches.  Endo/Heme/Allergies:  Positive for environmental allergies.  Psychiatric/Behavioral:  Negative for depression and memory loss. The patient is not nervous/anxious and does not have insomnia.    I have reviewed and (if needed) personally updated the patient's problem list, medications, allergies, past medical and surgical history, social and family history.   PAST MEDICAL HISTORY   Past Medical History:  Diagnosis Date   Abnormal cardiac CT angiography 11/16/2020   11/16/2020: CORONARY CA++ SCORE: Agatston Score 2245.  LAD 1093, LCx 959, RCA 193 (99th percentile) -> COR CTA (Agatston 2437) -moderate to severe multivessel CAD with significant blooming in all 3 epicardial vessels.  CAD RADS 3-4. FFRCT suggests mid RCA occlusion, but imaging suggests possible collateralization with patent flow distally. _.  Recommended Cardiac Catheterization   Allergic rhinitis    Anxiety    Arthritis    Asthma    childhood   Chronic low back pain    CKD (chronic kidney disease), stage III (HCC)    However, most recent creatinine 1.5.   Depression    With anxiety   GERD (gastroesophageal reflux disease)    Headache    sinus headaches    Hypercholesterolemia    Hypertension    Iron deficiency anemia 05/26/2015   Has required transfusions-followed by Dr. Ennever   Iron malabsorption 05/26/2015   Menopause    Numbness in both hands    mostly at night   Obesity    Osteoarthritis of both knees    Status post right TKA (and ankle) with plans for left TKA   Type 2 diabetes mellitus without complication, with long-term current use of insulin (HCC)    Type 2 on insulin pump   Vasovagal syncope 2018   Negative work-up   Vitamin D deficiency     PAST SURGICAL HISTORY   Past Surgical History:  Procedure Laterality Date   COLONOSCOPY     EYE SURGERY Bilateral    [facility]    HERNIA REPAIR      umb hernia as child   KNEE ARTHROSCOPY  02/12/2012   Procedure: ARTHROSCOPY KNEE;  Surgeon: Frank J Rowan, MD;  Location: [facility];  Service: Orthopedics;  Laterality: Right;  Partial Lateral Meniscectomy, Debridement chondromalacia   LAPAROSCOPIC GASTRIC SLEEVE RESECTION N/A 03/14/2016   Procedure: LAPAROSCOPIC GASTRIC SLEEVE RESECTION, WITH REPAIR OF HIATAL HERNIA REPAI, AND UPPER ENDO;  Surgeon: Matthew Martin, MD;  Location: [facility];  Service: General;  Laterality: N/A;   ORIF ANKLE FRACTURE Right 02/11/2014   Procedure: OPEN REDUCTION INTERNAL FIXATION (ORIF) RIGHT ANKLE FRACTURE;  Surgeon: Frank J Rowan, MD;  Location: [facility];  Service: Orthopedics;  Laterality: Right;   TOTAL KNEE ARTHROPLASTY Right 08/05/2014   Procedure: TOTAL KNEE ARTHROPLASTY;  Surgeon: Frank Rowan, MD;  Location: [facility];  Service: Orthopedics;  Laterality: Right;   TRANSTHORACIC ECHOCARDIOGRAM  05/2016   EF 60 to 65%.  Severe basal septal LVH with mild concentric LVH.    No R WMA.  GR 1 DD.  Indeterminate filling pressures. Minimal valve disease.   TRIGGER FINGER RELEASE Right 10/14/2018   Procedure: RELEASE TRIGGER FINGER/A-1 PULLEY;  Surgeon: Kuzma, Kevin, MD;  Location:  SURGERY CENTER;  Service: Orthopedics;  Laterality: Right;   UPPER GI ENDOSCOPY      Immunization History  Administered Date(s) Administered   Influenza-Unspecified 01/07/2014, 12/03/2014   PPD Test 02/14/2014   Pneumococcal-Unspecified 02/14/2014    MEDICATIONS/ALLERGIES   Current Meds  Medication Sig   atorvastatin (LIPITOR) 40 MG tablet Take 40 mg by mouth at bedtime.    CALCIUM PO Take 1 capsule by mouth 3 (three) times daily.    diphenhydrAMINE (BENADRYL) 25 MG tablet Take 25 mg by mouth every 6 (six) hours as needed for allergies.   Dulaglutide (TRULICITY) 0.75 MG/0.5ML SOPN Inject 0.75 mg into the skin once a week. Managed by PCP   escitalopram (LEXAPRO) 10 MG tablet Take 10 mg by mouth at bedtime.     Homeopathic Products (LEG CRAMPS) TABS Take 1 tablet by mouth at bedtime.   HYDROcodone-acetaminophen (NORCO) 5-325 MG tablet 1-2 tabs po q6 hours prn pain   insulin lispro (HUMALOG) 100 UNIT/ML KwikPen Inject 8 Units into the skin 3 (three) times daily.   lansoprazole (PREVACID) 15 MG capsule Take 15 mg by mouth at bedtime.    levocetirizine (XYZAL) 5 MG tablet Take 5 mg by mouth at bedtime as needed for allergies.   losartan (COZAAR) 100 MG tablet Take 100 mg by mouth at bedtime.   metFORMIN (GLUCOPHAGE) 500 MG tablet Take by mouth daily with breakfast.   Multiple Vitamin (MULTIVITAMIN WITH MINERALS) TABS tablet Take 1 tablet by mouth 2 (two) times daily.   ONETOUCH DELICA LANCETS FINE MISC Use to check blood sugar once a day dx code E11.65   OVER THE COUNTER MEDICATION Take 1 tablet by mouth at bedtime. Restless leg    Allergies  Allergen Reactions   Lactose Intolerance (Gi) Diarrhea and Other (See Comments)    *Gas*    Oatmeal Other (See Comments)    "Gas"     SOCIAL HISTORY/FAMILY HISTORY   Reviewed in Epic:   Social History   Tobacco Use   Smoking status: Former    Packs/day: 0.25    Years: 1.00    Pack years: 0.25    Types: Cigarettes    Quit date: 02/08/1972    Years since quitting: 49.0   Smokeless tobacco: Never  Substance Use Topics   Alcohol use: No    Alcohol/week: 0.0 standard drinks   Drug use: No   Social History   Social History Narrative   She is single, now works part-time administrator for [employer].  Previously worked as a pastor.   She is single, lives alone.  Used to exercise by walking frequently, but with her knee arthritis, not walking as much.   Family History  Problem Relation Age of Onset   Alcohol abuse Father    Heart attack Sister 41    OBJCTIVE -PE, EKG, labs   Wt Readings from Last 3 Encounters:  01/26/21 202 lb (91.6 kg)  12/30/20 202 lb 12.8 oz (92 kg)  03/21/20 197 lb (89.4 kg)    Physical Exam: BP 138/85 (BP  Location: Left Arm)   Pulse 76   Ht 5' 5.5" (1.664 m)   Wt 202 lb (91.6 kg)   SpO2 97%   BMI 33.10 kg/m  Physical Exam Vitals reviewed.  Constitutional:        General: She is not in acute distress (She had that brief 10-second episode where she was doubled over in pain just started talking to her, but then after that looked fine.).    Appearance: Normal appearance. She is obese. She is not ill-appearing (Healthy-appearing) or toxic-appearing.     Comments: Mildly obese.  Was well-nourished and well-groomed.  HENT:     Head: Normocephalic and atraumatic.  Neck:     Vascular: No carotid bruit, hepatojugular reflux or JVD.  Cardiovascular:     Rate and Rhythm: Normal rate and regular rhythm. No extrasystoles are present.    Chest Wall: PMI is not displaced.     Pulses: Normal pulses.     Heart sounds: S1 normal and S2 normal. Heart sounds are distant. No murmur heard.   No friction rub. No gallop.  Pulmonary:     Effort: Pulmonary effort is normal. No respiratory distress.     Breath sounds: Normal breath sounds. No wheezing, rhonchi or rales.  Chest:     Chest wall: Tenderness (She still has point tenderness on [place] sternal border on the right and left.) present.  Abdominal:     Comments: Obese.  Difficult to assess HSM  Musculoskeletal:        General: Swelling (Trivial bilateral ankle) and tenderness (Left knee is tender) present.     Cervical back: Normal range of motion and neck supple.  Neurological:     General: No focal deficit present.     Mental Status: She is alert and oriented to person, place, and time.     Motor: No weakness.     Gait: Gait abnormal (Antalgic).  Psychiatric:        Mood and Affect: Mood normal.        Behavior: Behavior normal.        Thought Content: Thought content normal.        Judgment: Judgment normal.     Adult ECG Report  Rate: 72;  Rhythm: normal sinus rhythm, sinus arrhythmia, and normal axis, intervals and durations. ;    Narrative Interpretation: Stable  Recent Labs: Reviewed 09/21/2020: TC 170, TG 48, HDL 82, LDL 77.  A1c 8.0.  Hgb 12.3, BUN 25, Cr 1.15, K+ 4.0.  AST/ALT 16/15.  Alk phos 75 01/12/2021-A1c 7.1 (notably improved). Cr 1.2 No results found for: CHOL, HDL, LDLCALC, LDLDIRECT, TRIG, CHOLHDL Lab Results  Component Value Date   CREATININE 1.12 (H) 01/27/2021   BUN 23 01/27/2021   NA 135 01/27/2021   K 4.2 01/27/2021   CL 100 01/27/2021   CO2 24 01/27/2021   CBC Latest Ref Rng & Units 01/27/2021 03/21/2020 04/19/2018  WBC 3.4 - 10.8 x10E3/uL 3.2(L) 3.8(L) 2.8(L)  Hemoglobin 11.1 - 15.9 g/dL 12.3 12.4 12.8  Hematocrit 34.0 - 46.6 % 39.4 40.5 42.0  Platelets 150 - 450 x10E3/uL 200 193 161    Lab Results  Component Value Date   HGBA1C 6.8 (H) 08/03/2014   No results found for: TSH  ==================================================  COVID-19 Education: The signs and symptoms of COVID-19 were discussed with the patient and how to seek care for testing (follow up with PCP or arrange E-visit).    I spent a total of 45  minutes with the patient spent in direct patient consultation.  Additional time spent with chart review  / charting (studies, outside notes, etc): 20 min Total Time: 65 min  Current medicines are reviewed at length with the patient today.  (+/- concerns) N/A  This visit   occurred during the SARS-CoV-2 public health emergency.  Safety protocols were in place, including screening questions prior to the visit, additional usage of staff PPE, and extensive cleaning of exam room while observing appropriate contact time as indicated for disinfecting solutions.  Notice: This dictation was prepared with Dragon dictation along with smart phrase technology. Any transcriptional errors that result from this process are unintentional and may not be corrected upon review.   Studies Ordered:  Orders Placed This Encounter  Procedures   Basic metabolic panel   CBC    Patient  Instructions / Medication Changes & Studies & Tests Ordered   Patient Instructions  Medication Instructions:   No changes  *If you need a refill on your cardiac medications before your next appointment, please call your pharmacy*   Lab Work: Cbc Bmp  If you have labs (blood work) drawn today and your tests are completely normal, you will receive your results only by: MyChart Message (if you have MyChart) OR A paper copy in the mail If you have any lab test that is abnormal or we need to change your treatment, we will call you to review the results.   Testing/Procedures:  Will be done Feb 02, 2021 at [place] Your physician has requested that you have a cardiac catheterization. Cardiac catheterization is used to diagnose and/or treat various heart conditions. Doctors may recommend this procedure for a number of different reasons. The most common reason is to evaluate chest pain. Chest pain can be a symptom of coronary artery disease (CAD), and cardiac catheterization can show whether plaque is narrowing or blocking your heart's arteries. This procedure is also used to evaluate the valves, as well as measure the blood flow and oxygen levels in different parts of your heart. For further information please visit www.cardiosmart.org. Please follow instruction sheet, as given.    Follow-Up: At [facility], you and your health needs are our priority.  As part of our continuing mission to provide you with exceptional heart care, we have created designated Provider Care Teams.  These Care Teams include your primary Cardiologist (physician) and Advanced Practice Providers (APPs -  Physician Assistants and Nurse Practitioners) who all work together to provide you with the care you need, when you need it.     Your next appointment:   2 month(s) Keep appt for Mar 03, 2021  The format for your next appointment:   In Person  Provider:   Obi Scrima, MD   Other Instructions    [facility] [address] [place] [address] Dept: [phone] Loc: [phone]  Karen Gardner  01/26/2021  You are scheduled for a Cardiac Catheterization on Wednesday, November 2 with Dr. Cincere Deprey.  1. Please arrive at the [facility] (Main Entrance A) at [facility]: [address] [place], [place] [address] at 6:30 AM (This time is two hours before your procedure to ensure your preparation). Free valet parking service is available.   Special note: Every effort is made to have your procedure done on time. Please understand that emergencies sometimes delay scheduled procedures.  2. Diet: Do not eat solid foods after midnight.  The patient may have clear liquids until 5am upon the day of the procedure.  3. Labs: You will need to have blood drawn on  Thursday Oct 27 or Friday, October 28 at [facility] [address], [place]  Open: 8am - 5pm (Lunch 12:30 -   1:30)   Phone: [phone]. You do not need to be fasting.  4. Medication instructions in preparation for your procedure:   Contrast Allergy: No    Do not take the morning of the procedure  losartan (COZAAR) 100 MG tablet, Take 100 mg by mouth at bedtime.    Take your dose  as usual Saturday   Dulaglutide (TRULICITY) 0.75 MG/0.5ML SOPN, Inject 0.75 mg into the skin once a week.      insulin lispro (HUMALOG) 100 UNIT/ML KwikPen, Inject 8 Units into the skin 3 (three) times daily. Take 4 units the evening before and do not  take the morning of procedures     metFORMIN (GLUCOPHAGE) 500 MG tablet, Take by mouth daily with breakfast. - take the morning of procedure but do not take for 48 hours after procedure.  On the morning of your procedure, take your Aspirin and any morning medicines NOT listed above.  You may use sips of water.  5. Plan for one night stay--bring personal belongings. 6. Bring a current list of  your medications and current insurance cards. 7. You MUST have a responsible person to drive you home. 8. Someone MUST be with you the first 24 hours after you arrive home or your discharge will be delayed. 9. Please wear clothes that are easy to get on and off and wear slip-on shoes.  Thank you for allowing us to care for you!   -- [facility]     Ellyssa Zagal, M.D., M.S. Interventional Cardiologist   Pager # [phone] Phone # [phone] [address] [place], [place] [address]   Thank you for choosing [facility]!!     

## 2021-01-27 DIAGNOSIS — R931 Abnormal findings on diagnostic imaging of heart and coronary circulation: Secondary | ICD-10-CM | POA: Diagnosis not present

## 2021-01-27 DIAGNOSIS — R0609 Other forms of dyspnea: Secondary | ICD-10-CM | POA: Diagnosis not present

## 2021-01-28 LAB — BASIC METABOLIC PANEL
BUN/Creatinine Ratio: 21 (ref 12–28)
BUN: 23 mg/dL (ref 8–27)
CO2: 24 mmol/L (ref 20–29)
Calcium: 9.2 mg/dL (ref 8.7–10.3)
Chloride: 100 mmol/L (ref 96–106)
Creatinine, Ser: 1.12 mg/dL — ABNORMAL HIGH (ref 0.57–1.00)
Glucose: 232 mg/dL — ABNORMAL HIGH (ref 70–99)
Potassium: 4.2 mmol/L (ref 3.5–5.2)
Sodium: 135 mmol/L (ref 134–144)
eGFR: 54 mL/min/{1.73_m2} — ABNORMAL LOW (ref >59)

## 2021-01-28 LAB — CBC
Hematocrit: 39.4 % (ref 34.0–46.6)
Hemoglobin: 12.3 g/dL (ref 11.1–15.9)
MCH: 28.6 pg (ref 26.6–33.0)
MCHC: 31.2 g/dL — ABNORMAL LOW (ref 31.5–35.7)
MCV: 92 fL (ref 79–97)
Platelets: 200 10*3/uL (ref 150–450)
RBC: 4.3 x10E6/uL (ref 3.77–5.28)
RDW: 11.6 % — ABNORMAL LOW (ref 11.7–15.4)
WBC: 3.2 10*3/uL — ABNORMAL LOW (ref 3.4–10.8)

## 2021-01-30 ENCOUNTER — Encounter: Payer: Self-pay | Admitting: Cardiology

## 2021-01-30 NOTE — Assessment & Plan Note (Signed)
Interestingly, she has had longstanding "Q waves "in precordial leads which would suggest prior anterior infarct as opposed inferior related to RCA.  Likely nonspecific findings.

## 2021-01-30 NOTE — Assessment & Plan Note (Signed)
Interestingly, she is not really symptomatic, but has had some symptoms and abnormal EKG.  Especially in light of her potential knee surgery, I think we need to answer the question about ischemic CAD.  If she does have occluded RCA with collateralization, the plan will be aggressive medical management.  Plan: We will schedule Left Heart Catheterization with Coronary Angiography and Possible PCI for next week.  Recommend starting aspirin, continuing statin at current dose although may need to titrate further. => Either convert from atorvastatin to rosuvastatin or increase to 80 mg based on Results.  On ARB, anticipate starting low-dose beta-blocker.  Not on any antianginal medications -- have room for Beta Blocker +/- Calcium Channel Blocker.  Shared Decision Making/Informed Consent The risks [stroke (1 in 1000), death (1 in 1000), kidney failure [usually temporary] (1 in 500), bleeding (1 in 200), allergic reaction [possibly serious] (1 in 200)], benefits (diagnostic support and management of coronary artery disease) and alternatives of a cardiac catheterization were discussed in detail with [name] and she is willing to proceed.

## 2021-01-30 NOTE — Assessment & Plan Note (Signed)
Borderline blood pressure today on current dose of ARB.  Anticipate initiating carvedilol post cath.

## 2021-01-30 NOTE — Assessment & Plan Note (Signed)
Certainly a couple of her chest pain sounds musculoskeletal, in light of her abnormal coronary CTA, we have to consider the possibility of ischemic CAD.  Plan: Invasive cardiac evaluation with Left Heart Cath and Coronary Angiography

## 2021-01-30 NOTE — Assessment & Plan Note (Signed)
I still think the exertional dyspnea is probably deconditioning.  It is possible that she has a CTO of the RCA, if so, this could be contributing. With suggestion of occluded RCA, we are proceeding with cardiac catheterization for definitive evaluation.

## 2021-01-30 NOTE — Assessment & Plan Note (Addendum)
Based on severely elevated coronary calcium score, will need to be more aggressive with lipid management.  Anticipate post cath titration either to 80 mg atorvastatin or switching to statin 40 mg.  I suspect that she may need aggressive therapy as well.  With target LDL now close to 55.   Dose adjustment timing of her  diabetes medication/insulin discussed as part of preoperative recommendations.

## 2021-02-01 ENCOUNTER — Telehealth: Payer: Self-pay | Admitting: *Deleted

## 2021-02-01 NOTE — Telephone Encounter (Signed)
Cardiac catheterization scheduled at [facility] for: Wednesday February 02, 2021 8:30 AM [facility] Main Entrance [facility]) at: 6:30 AM   No solid food after midnight prior to cath, clear liquids until 5 AM day of procedure.  Medication instructions: Hold: Insulin-AM of procedure Metformin-day of procedure and 48 hours post procedure Losartan-PM prior to procedure-GFR 54-pt reports she takes in evening  Except hold medications usual morning medications can be taken pre-cath with sips of water including aspirin 81 mg.    Confirmed patient has responsible adult to drive home post procedure and be with patient first 24 hours after arriving home.  [facility] does allow one visitor to accompany you and wait in the hospital waiting room while you are there for your procedure. You and your visitor will be asked to wear a mask once you enter the hospital.   Patient reports does not currently have any new symptoms concerning for COVID-19 and no household members with COVID-19 like illness.            Reviewed procedure/mask/visitor instructions with patient.

## 2021-02-02 ENCOUNTER — Encounter (HOSPITAL_COMMUNITY)
Admission: RE | Disposition: A | Discharge status: Home / Self Care | Payer: Self-pay | Source: Home / Self Care | Attending: Cardiology

## 2021-02-02 ENCOUNTER — Other Ambulatory Visit: Payer: Self-pay

## 2021-02-02 ENCOUNTER — Ambulatory Visit (HOSPITAL_COMMUNITY)
Admission: RE | Admit: 2021-02-02 | Discharge: 2021-02-02 | Disposition: A | Discharge status: Home / Self Care | Payer: Medicare Other | Attending: Cardiology | Admitting: Cardiology

## 2021-02-02 ENCOUNTER — Encounter (HOSPITAL_COMMUNITY): Payer: Self-pay | Admitting: Cardiology

## 2021-02-02 DIAGNOSIS — R931 Abnormal findings on diagnostic imaging of heart and coronary circulation: Secondary | ICD-10-CM

## 2021-02-02 DIAGNOSIS — I129 Hypertensive chronic kidney disease with stage 1 through stage 4 chronic kidney disease, or unspecified chronic kidney disease: Secondary | ICD-10-CM | POA: Insufficient documentation

## 2021-02-02 DIAGNOSIS — E669 Obesity, unspecified: Secondary | ICD-10-CM | POA: Insufficient documentation

## 2021-02-02 DIAGNOSIS — Z8249 Family history of ischemic heart disease and other diseases of the circulatory system: Secondary | ICD-10-CM | POA: Insufficient documentation

## 2021-02-02 DIAGNOSIS — R0609 Other forms of dyspnea: Secondary | ICD-10-CM | POA: Diagnosis not present

## 2021-02-02 DIAGNOSIS — Z79899 Other long term (current) drug therapy: Secondary | ICD-10-CM | POA: Diagnosis not present

## 2021-02-02 DIAGNOSIS — Z7984 Long term (current) use of oral hypoglycemic drugs: Secondary | ICD-10-CM | POA: Insufficient documentation

## 2021-02-02 DIAGNOSIS — Z6833 Body mass index (BMI) 33.0-33.9, adult: Secondary | ICD-10-CM | POA: Diagnosis not present

## 2021-02-02 DIAGNOSIS — Z794 Long term (current) use of insulin: Secondary | ICD-10-CM | POA: Diagnosis not present

## 2021-02-02 DIAGNOSIS — E1122 Type 2 diabetes mellitus with diabetic chronic kidney disease: Secondary | ICD-10-CM | POA: Insufficient documentation

## 2021-02-02 DIAGNOSIS — I2510 Atherosclerotic heart disease of native coronary artery without angina pectoris: Secondary | ICD-10-CM | POA: Diagnosis not present

## 2021-02-02 DIAGNOSIS — E7800 Pure hypercholesterolemia, unspecified: Secondary | ICD-10-CM | POA: Diagnosis not present

## 2021-02-02 DIAGNOSIS — N183 Chronic kidney disease, stage 3 unspecified: Secondary | ICD-10-CM | POA: Diagnosis not present

## 2021-02-02 DIAGNOSIS — Z87891 Personal history of nicotine dependence: Secondary | ICD-10-CM | POA: Insufficient documentation

## 2021-02-02 DIAGNOSIS — Z7985 Long-term (current) use of injectable non-insulin antidiabetic drugs: Secondary | ICD-10-CM | POA: Insufficient documentation

## 2021-02-02 HISTORY — PX: LEFT HEART CATH AND CORONARY ANGIOGRAPHY: CATH118249

## 2021-02-02 LAB — GLUCOSE, CAPILLARY
Glucose-Capillary: 171 mg/dL — ABNORMAL HIGH (ref 70–99)
Glucose-Capillary: 112 mg/dL — ABNORMAL HIGH (ref 70–99)

## 2021-02-02 SURGERY — LEFT HEART CATH AND CORONARY ANGIOGRAPHY
Anesthesia: LOCAL

## 2021-02-02 MED ORDER — HEPARIN (PORCINE) IN NACL 1000-0.9 UT/500ML-% IV SOLN
INTRAVENOUS | Status: AC
  Filled 2021-02-02: qty 500

## 2021-02-02 MED ORDER — SODIUM CHLORIDE 0.9% FLUSH: 3.0000 mL | INTRAVENOUS | Status: DC | PRN

## 2021-02-02 MED ORDER — HEPARIN (PORCINE) IN NACL 1000-0.9 UT/500ML-% IV SOLN
INTRAVENOUS | Status: DC | PRN
  Administered 2021-02-02 (×3): 500 mL

## 2021-02-02 MED ORDER — MIDAZOLAM HCL 2 MG/2ML IJ SOLN
INTRAMUSCULAR | Status: AC
  Filled 2021-02-02: qty 2

## 2021-02-02 MED ORDER — LIDOCAINE HCL (PF) 1 % IJ SOLN
INTRAMUSCULAR | Status: AC
  Filled 2021-02-02: qty 30

## 2021-02-02 MED ORDER — HYDRALAZINE HCL 20 MG/ML IJ SOLN
INTRAMUSCULAR | Status: DC | PRN
  Administered 2021-02-02 (×2): 10 mg via INTRAVENOUS

## 2021-02-02 MED ORDER — FENTANYL CITRATE (PF) 100 MCG/2ML IJ SOLN
INTRAMUSCULAR | Status: DC | PRN
  Administered 2021-02-02 (×2): 25 ug via INTRAVENOUS

## 2021-02-02 MED ORDER — SODIUM CHLORIDE 0.9 % WEIGHT BASED INFUSION: 1.0000 mL/kg/h | INTRAVENOUS | Status: DC

## 2021-02-02 MED ORDER — VERAPAMIL HCL 2.5 MG/ML IV SOLN
INTRAVENOUS | Status: DC | PRN
  Administered 2021-02-02 (×2): 10 mL via INTRA_ARTERIAL

## 2021-02-02 MED ORDER — FENTANYL CITRATE (PF) 100 MCG/2ML IJ SOLN
INTRAMUSCULAR | Status: AC
  Filled 2021-02-02: qty 2

## 2021-02-02 MED ORDER — CARVEDILOL 3.125 MG PO TABS: 3.1250 mg | ORAL_TABLET | Freq: Two times a day (BID) | ORAL | 11 refills | Status: DC

## 2021-02-02 MED ORDER — VERAPAMIL HCL 2.5 MG/ML IV SOLN
INTRAVENOUS | Status: AC
  Filled 2021-02-02: qty 2

## 2021-02-02 MED ORDER — SODIUM CHLORIDE 0.9 % IV SOLN: 250.0000 mL | INTRAVENOUS | Status: DC | PRN

## 2021-02-02 MED ORDER — ONDANSETRON HCL 4 MG/2ML IJ SOLN
4.0000 mg | Freq: Four times a day (QID) | INTRAMUSCULAR | Status: DC | PRN
  Administered 2021-02-02: 4 mg via INTRAVENOUS
  Filled 2021-02-02: qty 2

## 2021-02-02 MED ORDER — HYDRALAZINE HCL 20 MG/ML IJ SOLN
INTRAMUSCULAR | Status: AC
  Filled 2021-02-02: qty 1

## 2021-02-02 MED ORDER — HEPARIN SODIUM (PORCINE) 1000 UNIT/ML IJ SOLN
INTRAMUSCULAR | Status: AC
  Filled 2021-02-02: qty 1

## 2021-02-02 MED ORDER — SODIUM CHLORIDE 0.9% FLUSH: 3.0000 mL | Freq: Two times a day (BID) | INTRAVENOUS | Status: DC

## 2021-02-02 MED ORDER — SODIUM CHLORIDE 0.9 % WEIGHT BASED INFUSION
3.0000 mL/kg/h | INTRAVENOUS | Status: AC
  Administered 2021-02-02: 3 mL/kg/h via INTRAVENOUS

## 2021-02-02 MED ORDER — ASPIRIN 81 MG PO CHEW: 81.0000 mg | CHEWABLE_TABLET | ORAL | Status: DC

## 2021-02-02 MED ORDER — SODIUM CHLORIDE 0.9 % IV BOLUS: 250.0000 mL | Freq: Once | INTRAVENOUS | Status: DC

## 2021-02-02 MED ORDER — MIDAZOLAM HCL 2 MG/2ML IJ SOLN
INTRAMUSCULAR | Status: DC | PRN
  Administered 2021-02-02: 2 mg via INTRAVENOUS
  Administered 2021-02-02: 1 mg via INTRAVENOUS

## 2021-02-02 MED ORDER — HEPARIN SODIUM (PORCINE) 1000 UNIT/ML IJ SOLN
INTRAMUSCULAR | Status: DC | PRN
  Administered 2021-02-02: 4500 [IU] via INTRAVENOUS

## 2021-02-02 MED ORDER — IOHEXOL 350 MG/ML SOLN
INTRAVENOUS | Status: DC | PRN
  Administered 2021-02-02: 105 mL

## 2021-02-02 MED ORDER — ACETAMINOPHEN 325 MG PO TABS: 650.0000 mg | ORAL_TABLET | ORAL | Status: DC | PRN

## 2021-02-02 MED ORDER — SODIUM CHLORIDE 0.9 % IV SOLN: INTRAVENOUS | Status: AC

## 2021-02-02 MED ORDER — LIDOCAINE HCL (PF) 1 % IJ SOLN
INTRAMUSCULAR | Status: DC | PRN
  Administered 2021-02-02: 3 mL via INTRADERMAL

## 2021-02-02 MED ORDER — LABETALOL HCL 5 MG/ML IV SOLN: 10.0000 mg | INTRAVENOUS | Status: DC | PRN

## 2021-02-02 MED ORDER — HYDRALAZINE HCL 20 MG/ML IJ SOLN: 10.0000 mg | INTRAMUSCULAR | Status: DC | PRN

## 2021-02-02 SURGICAL SUPPLY — 15 items
CATH INFINITI 5 FR JL3.5 (CATHETERS) ×1 IMPLANT
CATH INFINITI 5FR JL4 (CATHETERS) ×1 IMPLANT
CATH LAUNCHER 5F EBU3.0 (CATHETERS) IMPLANT
CATH LAUNCHER 5F EBU3.5 (CATHETERS) ×1 IMPLANT
CATH OPTITORQUE TIG 4.0 5F (CATHETERS) ×1 IMPLANT
CATHETER LAUNCHER 5F EBU3.0 (CATHETERS) ×2
DEVICE RAD COMP TR BAND LRG (VASCULAR PRODUCTS) ×1 IMPLANT
GLIDESHEATH SLEND SS 6F .021 (SHEATH) ×1 IMPLANT
GUIDEWIRE INQWIRE 1.5J.035X260 (WIRE) IMPLANT
INQWIRE 1.5J .035X260CM (WIRE) ×2
KIT HEART LEFT (KITS) ×2 IMPLANT
PACK CARDIAC CATHETERIZATION (CUSTOM PROCEDURE TRAY) ×2 IMPLANT
TRANSDUCER W/STOPCOCK (MISCELLANEOUS) ×2 IMPLANT
TUBING CIL FLEX 10 FLL-RA (TUBING) ×2 IMPLANT
WIRE HI TORQ VERSACORE-J 145CM (WIRE) ×1 IMPLANT

## 2021-02-02 NOTE — Discharge Instructions (Signed)
Radial Site Care  This sheet gives you information about how to care for yourself after your procedure. Your health care provider may also give you more specific instructions. If you have problems or questions, contact your health care provider. What can I expect after the procedure? After the procedure, it is common to have: Bruising and tenderness at the catheter insertion area. Follow these instructions at home: Medicines Take over-the-counter and prescription medicines only as told by your health care provider. Insertion site care Follow instructions from your health care provider about how to take care of your insertion site. Make sure you: Wash your hands with soap and water before you remove your bandage (dressing). If soap and water are not available, use hand sanitizer. May remove dressing in 24 hours. Check your insertion site every day for signs of infection. Check for: Redness, swelling, or pain. Fluid or blood. Pus or a bad smell. Warmth. Do no take baths, swim, or use a hot tub for 5 days. You may shower 24-48 hours after the procedure. Remove the dressing and gently wash the site with plain soap and water. Pat the area dry with a clean towel. Do not rub the site. That could cause bleeding. Do not apply powder or lotion to the site. Activity  For 24 hours after the procedure, or as directed by your health care provider: Do not flex or bend the affected arm. Do not push or pull heavy objects with the affected arm. Do not drive yourself home from the hospital or clinic. You may drive 24 hours after the procedure. Do not operate machinery or power tools. KEEP ARM ELEVATED THE REMAINDER OF THE DAY. Do not push, pull or lift anything that is heavier than 10 lb for 5 days. Ask your health care provider when it is okay to: Return to work or school. Resume usual physical activities or sports. Resume sexual activity. General instructions If the catheter site starts to  bleed, raise your arm and put firm pressure on the site. If the bleeding does not stop, get help right away. This is a medical emergency. DRINK PLENTY OF FLUIDS FOR THE NEXT 2-3 DAYS. No alcohol consumption for 24 hours after receiving sedation. If you went home on the same day as your procedure, a responsible adult should be with you for the first 24 hours after you arrive home. Keep all follow-up visits as told by your health care provider. This is important. Contact a health care provider if: You have a fever. You have redness, swelling, or yellow drainage around your insertion site. Get help right away if: You have unusual pain at the radial site. The catheter insertion area swells very fast. The insertion area is bleeding, and the bleeding does not stop when you hold steady pressure on the area. Your arm or hand becomes pale, cool, tingly, or numb. These symptoms may represent a serious problem that is an emergency. Do not wait to see if the symptoms will go away. Get medical help right away. Call your local emergency services (911 in the [place] Do not drive yourself to the hospital. Summary After the procedure, it is common to have bruising and tenderness at the site. Follow instructions from your health care provider about how to take care of your radial site wound. Check the wound every day for signs of infection.  This information is not intended to replace advice given to you by your health care provider. Make sure you discuss any questions you have with   your health care provider. Document Revised: 04/25/2017 Document Reviewed: 04/25/2017 Elsevier Patient Education  2020 [employer]  

## 2021-02-02 NOTE — Progress Notes (Signed)
Pt c/o nausea and dizziness after returning from the cath lab. BP has remained consistently in the 80's systolic after returning from the cath lab. Mardella Layman PA notified. Order placed for 250cc bolus and 4mg  zofran given for nausea. Pt placed in a supine position and resting comfortably. Other VSS, will continue to monitor BP.

## 2021-02-02 NOTE — Interval H&P Note (Signed)
History and Physical Interval Note:  02/02/2021 8:03 AM  [name]  has presented today for surgery, with the diagnosis of abnormal cta.    Cath Lab Visit (complete for each Cath Lab visit)  Clinical Evaluation Leading to the Procedure:   ACS: No.  Non-ACS:    Anginal Classification: CCS II  Anti-ischemic medical therapy: Minimal Therapy (1 class of medications)  Non-Invasive Test Results: High-risk stress test findings: cardiac mortality >3%/year  Prior CABG: No previous CABG    Bryan Lemma

## 2021-02-02 NOTE — Interval H&P Note (Signed)
History and Physical Interval Note:  02/02/2021 8:02 AM  Karen Gardner  has presented today for surgery, with the diagnosis of abnormal cta.  The various methods of treatment have been discussed with the patient and family. After consideration of risks, benefits and other options for treatment, the patient has consented to  Procedure(s): LEFT HEART CATH AND CORONARY ANGIOGRAPHY (N/A) PERCUTANEOUS CORONARY INTERVENTION  as a surgical intervention.  The patient's history has been reviewed, patient examined, no change in status, stable for surgery.  I have reviewed the patient's chart and labs.  Questions were answered to the patient's satisfaction.     Bryan Lemma

## 2021-03-03 ENCOUNTER — Encounter: Payer: Self-pay | Admitting: Cardiology

## 2021-03-03 ENCOUNTER — Other Ambulatory Visit: Payer: Self-pay | Admitting: Cardiology

## 2021-03-03 ENCOUNTER — Other Ambulatory Visit: Payer: Self-pay

## 2021-03-03 ENCOUNTER — Ambulatory Visit (INDEPENDENT_AMBULATORY_CARE_PROVIDER_SITE_OTHER): Payer: Medicare Other | Admitting: Cardiology

## 2021-03-03 VITALS — BP 128/70 | HR 86 | Resp 20 | Ht 65.0 in | Wt 205.0 lb

## 2021-03-03 DIAGNOSIS — E1169 Type 2 diabetes mellitus with other specified complication: Secondary | ICD-10-CM | POA: Diagnosis not present

## 2021-03-03 DIAGNOSIS — I208 Other forms of angina pectoris: Secondary | ICD-10-CM

## 2021-03-03 DIAGNOSIS — I10 Essential (primary) hypertension: Secondary | ICD-10-CM

## 2021-03-03 DIAGNOSIS — E785 Hyperlipidemia, unspecified: Secondary | ICD-10-CM | POA: Diagnosis not present

## 2021-03-03 DIAGNOSIS — R0609 Other forms of dyspnea: Secondary | ICD-10-CM

## 2021-03-03 DIAGNOSIS — I25119 Atherosclerotic heart disease of native coronary artery with unspecified angina pectoris: Secondary | ICD-10-CM | POA: Diagnosis not present

## 2021-03-03 MED ORDER — ISOSORBIDE MONONITRATE ER 30 MG PO TB24: 30.0000 mg | ORAL_TABLET | Freq: Every day | ORAL | 6 refills | Status: DC

## 2021-03-03 MED ORDER — NITROGLYCERIN 0.4 MG SL SUBL: 0.4000 mg | SUBLINGUAL_TABLET | SUBLINGUAL | 4 refills | Status: DC | PRN

## 2021-03-03 MED ORDER — ASPIRIN EC 81 MG PO TBEC: 81.0000 mg | DELAYED_RELEASE_TABLET | Freq: Every day | ORAL | 3 refills | Status: DC

## 2021-03-03 MED ORDER — CARVEDILOL 6.25 MG PO TABS: 6.2500 mg | ORAL_TABLET | Freq: Two times a day (BID) | ORAL | 3 refills | Status: DC

## 2021-03-03 MED ORDER — RANEXA 500 MG PO TB12: 500.0000 mg | ORAL_TABLET | Freq: Two times a day (BID) | ORAL | 6 refills | Status: DC

## 2021-03-03 NOTE — Patient Instructions (Addendum)
Medication Instructions:    Changes include:   Increase Carvedilol 6.25 mg  twice a day   ( may take 2 tablets of 3.125 mg to equal 6.25 mg  to use current bottle)     Start taking Aspirin 81 mg  - take 30 min prior to taking  Isosorbide mono. 30 mg at bedtime  Start taking Isosorbide Mononitrate 30 mg  at bedtime   Start taking Ranexa ( Ranolazine ) 500 mg  take twice a day  about 12 hours apart if possible   Use Nitroglycerin sublingual tablets 0.4 mg as needed see instruction below    *If you need a refill on your cardiac medications before your next appointment, please call your pharmacy*   Lab Work: Not needed    Testing/Procedures: Will be schedule at [address] Your physician has requested that you have an echocardiogram. Echocardiography is a painless test that uses sound waves to create images of your heart. It provides your doctor with information about the size and shape of your heart and how well your heart's chambers and valves are working. This procedure takes approximately one hour. There are no restrictions for this procedure.    Follow-Up: At [facility], you and your health needs are our priority.  As part of our continuing mission to provide you with exceptional heart care, we have created designated Provider Care Teams.  These Care Teams include your primary Cardiologist (physician) and Advanced Practice Providers (APPs -  Physician Assistants and Nurse Practitioners) who all work together to provide you with the care you need, when you need it.     Your next appointment:   1 month(s)  The format for your next appointment:   In Person  Provider:   None    Other Instructions

## 2021-03-03 NOTE — Progress Notes (Signed)
Primary Care Provider: Leeroy Cha, MD [facility] Cardiologist: None.  Previously seen by Dr. Percival Spanish for syncope in 2018 Electrophysiologist: None Orthopedic Surgeon: Dr. Helene Kelp orthopedics  Clinic Note: Chief Complaint  Patient presents with   2 Week Follow-up    Discussed cath results    ===================================  ASSESSMENT/PLAN   Problem List Items Addressed This Visit       Cardiology Problems   Essential hypertension (Chronic)    Stable blood pressure on ARB and recently started beta-blocker.  Will increase to 6.25 mg twice daily carvedilol since she is still having chest discomfort off and on and heart rate is in the 80s..      Relevant Medications   nitroGLYCERIN (NITROSTAT) 0.4 MG SL tablet   isosorbide mononitrate (IMDUR) 30 MG 24 hr tablet   aspirin EC 81 MG tablet   carvedilol (COREG) 6.25 MG tablet   Coronary artery disease involving native coronary artery of native heart with angina pectoris (HCC) - Primary (Chronic)    Somewhat atypical chest pain which may or may not be true angina.  She may have microvascular disease.  The RCA is relatively small caliber vessel with diffuse disease.  Not very favorable for PCI simply because the extent PCI will be required.  Likely 2.0 to 2.25 mm vessel and codominant.  Recommend aggressive medical management. Plan: Start aspirin 81 mg Increase carvedilol to 6.25 mg twice daily Continue current dose of atorvastatin but likely titrate either to 80 mg or switch to rosuvastatin 40 mg based on upcoming LABS. Continue ARB Add Imdur 30 mg nightly and Ranexa 500 mg twice daily      Relevant Medications   nitroGLYCERIN (NITROSTAT) 0.4 MG SL tablet   isosorbide mononitrate (IMDUR) 30 MG 24 hr tablet   aspirin EC 81 MG tablet   carvedilol (COREG) 6.25 MG tablet   Other Relevant Orders   ECHOCARDIOGRAM COMPLETE   Chronic stable angina (HCC) (Chronic)    Exertional chest discomfort which may  or may not be cardiac in nature.  Difficult to tell if this is angina versus noncardiac chest pain, but with diffuse disease It is possible that she does have microvascular ischemia.  Unfortunate not a PCI target.  Plan: Increase carvedilol to 625 mg twice daily, add Imdur 30 mg daily and Ranexa 500 mg twice daily      Relevant Medications   nitroGLYCERIN (NITROSTAT) 0.4 MG SL tablet   isosorbide mononitrate (IMDUR) 30 MG 24 hr tablet   aspirin EC 81 MG tablet   carvedilol (COREG) 6.25 MG tablet   Other Relevant Orders   ECHOCARDIOGRAM COMPLETE   Hyperlipidemia associated with type 2 diabetes mellitus (HCC) (Chronic)    Now confirmed essentially obstructive CAD.  Target LDL should be less than 55, with acceptable level less than 70.  Most recent check was 38 in June.  She is due for recheck of lipids.  Anticipate adjusting dosing if not at goal.  She is on insulin and Trulicity, consider SGLT2 inhibitor.      Relevant Medications   nitroGLYCERIN (NITROSTAT) 0.4 MG SL tablet   isosorbide mononitrate (IMDUR) 30 MG 24 hr tablet   aspirin EC 81 MG tablet   carvedilol (COREG) 6.25 MG tablet     Other   DOE (dyspnea on exertion) (Chronic)    I think this is probably related to deconditioning, but she also has essentially a subtotaled small caliber codominant RCA which may contribute some to that her dyspnea.  Plan:  Check  2D echocardiogram Titrate antianginal medications as noted.      Relevant Orders   ECHOCARDIOGRAM COMPLETE  ===================================  HPI:    Karen Gardner is a 65 y.o. female with a PMH notable for AbnormalCoronary CT Angiogram, DM-2 (complicated by Diabetic Retinopathy), HTN and h/o Vasovagal Syncope (December '21) who presents for post cath follow-up.Ave Filter Muhlbauer was seen for initial consultation on September 29 at the request of Dr. Fara Olden in response to an Elevated Coronary Calcium Score and an abnormal EKG suggesting possible  prior anteroseptal infarct.  There was discussion about possible knee replacement by Dr. Justine Null, and PCP ordered a coronary calcium score for cardiovascular evaluation.  When the Coronary Calcium Score was shown to be elevated, she was referred for cardiology evaluation. During this consultation visit, she indicated that she was not planning to proceed with surgery.  She had been having some intermittent episodes of sharp central chest pain coming and going with or without exertion.  Previously diagnosed with costochondritis.  She also noted some exertional dyspnea thought to be related to deconditioning. Because of chest pain and exertional dyspnea with abnormal EKG and elevated Coronary Calcium Score, we proceeded with coronary CT Angiogram   Ave Filter Dinkel was seen for follow-up after Coronary CT Angiogram on 01/26/2021.  Had not noticed any significant symptoms of chest pain or pressure.  No significant exertional dyspnea.  Just off-and-on chest discomfort episodes with or without exertion.  Not very active because of knee pain. => Based on CT scan results, was referred for cardiac catheterization.  Recent Hospitalizations:  For heart catheterization 02/02/2021  Reviewed  CV studies:    The following studies were reviewed today: (if available, images/films reviewed: From Epic Chart or Care Everywhere) Coronary CT Angiogram 01/17/2021:  Coronary calcium score 2437 (99th percentile).  Moderate and possibly severe coronary artery atherosclerosis.  Significant blooming artifact in all 3 epicardial vessels.  CAD RADS 3-4.  Recommend Cardiac Catheterization. FFRCT :  LM -0.99; (p-m-d) LAD  0.97, 0.93, 0.87; LCx 0.9, 0.96, 0.82.; RI 0.98, 0.86, 0.82.RCA 0.99,? mid Occluded - However there does appear to be some distal flow in the RCA suggesting possible collateral flow. Cardiac Catheterization Recommended  Cardiac Cath 02/02/2021: Heavily calcified tortuous codominant RCA with proximal 70% stenosis.  Mid  to distal RCA 90% stenosis.  Distal RCA 90%.  Not a PCI target.  50% RI, proximal to mid LAD 20%.  Mid to distal LCx 30% with 30% in LP AV-heavily calcified.  EF 55-65%.  Mildly elevated LVEDP. => RCA was not PCI target.  Recommended aggressive risk factor modification.  Started on carvedilol.  Along with aspirin.   Interval History:   KYRAN WHITTIER is here today to discuss cardiac catheterization results.  When I saw her before she was not noticing any episodes of chest pain or pressure with rest or exertion, but now she seems to be still having these weird off-and-on spells that are concerning to her.  I think, hearing about having occluded vessel she became very concerned and upset about what can be done about this..   Most of the visit was spent and explained to her medical management concept and the fact that her right coronary is relatively small and not likely a good target for revascularization.  She has not really the new medicines that I intended to start when she was discharged.  Cardiovascular ROS: positive for - chest pain, dyspnea on exertion, irregular heartbeat, palpitations, and Although chest pain is  not necessarily associated with exertion. negative for - edema, orthopnea, paroxysmal nocturnal dyspnea, rapid heart rate, shortness of breath, or syncope/near syncope or TIA/#V. tach, claudication   REVIEWED OF SYSTEMS   Review of Systems  Constitutional:  Positive for malaise/fatigue (Still has exercise intolerance.  Not doing much because of leg/knee pain.). Negative for weight loss (Weight gain secondary to knee pain and neck pain.).  HENT:  Positive for congestion (Only with seasonal allergies.). Negative for nosebleeds.   Respiratory:  Positive for cough (Seasonal allergies-nonproductive.) and shortness of breath (Exertional-related to deconditioning). Negative for wheezing (Sometimes when she has bad allergies she wheezes but not routinely.).   Cardiovascular:  Positive  for chest pain (Chest pain episodes as noted above.). Orthopnea: She sleeps on 3 pillows more for comfort than dyspnea..      Per HPI  Gastrointestinal:  Negative for abdominal pain, blood in stool and melena.  Genitourinary:  Negative for hematuria.  Musculoskeletal:  Positive for joint pain (s/p R knee Sgx - sore, but L knee pain is limiting). Negative for falls.  Neurological:  Negative for dizziness, focal weakness and headaches.  Endo/Heme/Allergies:  Positive for environmental allergies.  Psychiatric/Behavioral:  Negative for depression and memory loss. The patient is not nervous/anxious and does not have insomnia.    I have reviewed and (if needed) personally updated the patient's problem list, medications, allergies, past medical and surgical history, social and family history.   PAST MEDICAL HISTORY   Past Medical History:  Diagnosis Date   Allergic rhinitis    Anxiety    Arthritis    Asthma    childhood   Chronic low back pain    CKD (chronic kidney disease), stage III ([place])    However, most recent creatinine 1.5.   Coronary artery disease involving native heart with other form of angina pectoris, unspecified vessel or lesion type [facility]) 11/16/2020   11/16/2020: CORONARY CA++ SCORE: Agatston Score 2245.  LAD 1093, LCx 959, RCA 193 (99th percentile) -> COR CTA (Agatston 2437) -moderate to severe multivessel CAD with significant blooming in all 3 epicardial vessels.  CAD RADS 3-4. FFRCT suggests mid RCA occlusion, => CARDIAC CATH 02/02/21: RCA: prox 70%, Mid-distal 90% & distal 90% (small caliber, Not viable PCI target; Prox RI 50%.   Depression    With anxiety   GERD (gastroesophageal reflux disease)    Headache    sinus headaches    Hypercholesterolemia    Hypertension    Iron deficiency anemia 05/26/2015   Has required transfusions-followed by Dr. Marin Olp   Iron malabsorption 05/26/2015   Menopause    Numbness in both hands    mostly at night   Obesity    Osteoarthritis  of both knees    Status post right TKA (and ankle) with plans for left TKA   Type 2 diabetes mellitus without complication, with long-term current use of insulin (HCC)    Type 2 on insulin pump   Vasovagal syncope 2018   Negative work-up   Vitamin D deficiency     PAST SURGICAL HISTORY   Past Surgical History:  Procedure Laterality Date   COLONOSCOPY     EYE SURGERY Bilateral    [place]    HERNIA REPAIR     umb hernia as child   KNEE ARTHROSCOPY  02/12/2012   Procedure: ARTHROSCOPY KNEE;  Surgeon: Kerin Salen, MD;  Location: [place];  Service: Orthopedics;  Laterality: Right;  Partial Lateral Meniscectomy, Debridement chondromalacia   LAPAROSCOPIC GASTRIC  SLEEVE RESECTION N/A 03/14/2016   Procedure: LAPAROSCOPIC GASTRIC SLEEVE RESECTION, WITH REPAIR OF HIATAL HERNIA REPAI, AND UPPER ENDO;  Surgeon: Johnathan Hausen, MD;  Location: [facility];  Service: General;  Laterality: N/A;   LEFT HEART CATH AND CORONARY ANGIOGRAPHY N/A 02/02/2021   Procedure: LEFT HEART CATH AND CORONARY ANGIOGRAPHY;  Surgeon: Leonie Man, MD;  Location: [place] [facility];; Heavily calcified tortuous codominant RCA with proximal 70% stenosis.  Mid to distal RCA 90% stenosis.  Distal RCA 90%.  Not a PCI target.  50% RI, proximal to mid LAD 20%.  Mid to distal LCx 30% with 30% in LP AV-heavily calcified.  EF 55-65%.  Mildly elevated LVEDP.   ORIF ANKLE FRACTURE Right 02/11/2014   Procedure: OPEN REDUCTION INTERNAL FIXATION (ORIF) RIGHT ANKLE FRACTURE;  Surgeon: Kerin Salen, MD;  Location: [place];  Service: Orthopedics;  Laterality: Right;   TOTAL KNEE ARTHROPLASTY Right 08/05/2014   Procedure: TOTAL KNEE ARTHROPLASTY;  Surgeon: Frederik Pear, MD;  Location: [place];  Service: Orthopedics;  Laterality: Right;   TRANSTHORACIC ECHOCARDIOGRAM  05/2016   EF 60 to 65%.  Severe basal septal LVH with mild concentric LVH.  No R WMA.  GR 1 DD.  Indeterminate filling pressures. Minimal valve disease.   TRIGGER  FINGER RELEASE Right 10/14/2018   Procedure: RELEASE TRIGGER FINGER/A-1 PULLEY;  Surgeon: Leanora Cover, MD;  Location: [place];  Service: Orthopedics;  Laterality: Right;   UPPER GI ENDOSCOPY      Immunization History  Administered Date(s) Administered   Influenza-Unspecified 01/07/2014, 12/03/2014   PPD Test 02/14/2014   Pneumococcal-Unspecified 02/14/2014    MEDICATIONS/ALLERGIES   Current Meds  Medication Sig   aspirin EC 81 MG tablet Take 1 tablet (81 mg total) by mouth daily. 30 minutes prior to taking Isosorbide   atorvastatin (LIPITOR) 40 MG tablet Take 40 mg by mouth at bedtime.    CALCIUM PO Take 1 capsule by mouth 3 (three) times daily.    carvedilol (COREG) 6.25 MG tablet Take 1 tablet (6.25 mg total) by mouth 2 (two) times daily.   diphenhydrAMINE (BENADRYL) 25 MG tablet Take 25 mg by mouth every 6 (six) hours as needed for allergies.   Dulaglutide (TRULICITY) 3.14 HF/0.2OV SOPN Inject 0.75 mg into the skin once a week. Managed by PCP   escitalopram (LEXAPRO) 10 MG tablet Take 10 mg by mouth at bedtime.    glucose blood (ONETOUCH VERIO) test strip Use as instructed to check blood sugar once a day dx code E11.65   Homeopathic Products (LEG CRAMPS) TABS Take 1 tablet by mouth at bedtime.   HYDROcodone-acetaminophen (NORCO) 5-325 MG tablet 1-2 tabs po q6 hours prn pain   insulin lispro (HUMALOG) 100 UNIT/ML KwikPen Inject 8 Units into the skin 3 (three) times daily.   isosorbide mononitrate (IMDUR) 30 MG 24 hr tablet Take 1 tablet (30 mg total) by mouth at bedtime.   lansoprazole (PREVACID) 15 MG capsule Take 15 mg by mouth at bedtime.    levocetirizine (XYZAL) 5 MG tablet Take 5 mg by mouth at bedtime as needed for allergies.   losartan (COZAAR) 100 MG tablet Take 100 mg by mouth at bedtime.   metFORMIN (GLUCOPHAGE) 500 MG tablet Take by mouth at bedtime.   Multiple Vitamin (MULTIVITAMIN WITH MINERALS) TABS tablet Take 1 tablet by mouth 2 (two) times daily.    nitroGLYCERIN (NITROSTAT) 0.4 MG SL tablet Place 1 tablet (0.4 mg total) under the tongue every 5 (five) minutes as  needed for chest pain.   ONETOUCH DELICA LANCETS FINE MISC Use to check blood sugar once a day dx code E11.65   OVER THE COUNTER MEDICATION Take 1 tablet by mouth at bedtime. Restless leg   [DISCONTINUED] carvedilol (COREG) 3.125 MG tablet Take 1 tablet (3.125 mg total) by mouth 2 (two) times daily.   [DISCONTINUED] RANEXA 500 MG 12 hr tablet Take 1 tablet (500 mg total) by mouth 2 (two) times daily.    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  Previously worked as a Theme park manager.   She is single, lives alone.  Used to exercise by walking frequently, but with her knee arthritis, not walking as much.   Family History  Problem Relation Age of Onset   Alcohol abuse Father    Heart attack Sister 40    OBJCTIVE -PE, EKG, labs   Wt Readings from Last 3 Encounters:  03/03/21 205 lb (93 kg)  02/02/21 202 lb (91.6 kg)  01/26/21 202 lb (91.6 kg)    Physical Exam: BP 128/70 (BP Location: Left Arm, Patient Position: Sitting, Cuff Size: Normal)   Pulse 86   Resp 20   Ht 5' 5"  (1.651 m)   Wt 205 lb (93 kg)   SpO2 97%   BMI 34.11 kg/m  Physical Exam Vitals reviewed.  Constitutional:      General: She is not in acute distress (She had that brief 10-second episode where she was doubled  over in pain just started talking to her, but then after that looked fine.).    Appearance: Normal appearance. She is obese. She is not ill-appearing (Healthy-appearing) or toxic-appearing.     Comments: Mildly obese, well-nourished well-groomed.  HENT:     Head: Normocephalic and atraumatic.  Neck:     Vascular: No carotid bruit, hepatojugular reflux or JVD.  Cardiovascular:     Rate and Rhythm: Normal rate and regular rhythm. No extrasystoles are present.    Chest Wall: PMI is not displaced.     Pulses: Normal pulses.     Heart sounds: S1 normal and S2 normal. Heart sounds are distant. No murmur heard.   No friction rub. No gallop.  Pulmonary:     Effort: Pulmonary effort is normal. No respiratory distress.     Breath sounds: Normal breath sounds. No wheezing, rhonchi or rales.  Chest:     Chest wall: Tenderness (She still has point tenderness on [place] sternal border on the right and left.) present.  Abdominal:     Comments: Obese.  Difficult to assess HSM  Musculoskeletal:        General: Swelling (Trivial ankle swelling) and tenderness (Left knee is tender) present.     Cervical back: Normal range of motion and neck  supple.  Skin:    General: Skin is warm and dry.     Coloration: Skin is not jaundiced.  Neurological:     General: No focal deficit present.     Mental Status: She is alert and oriented to person, place, and time.     Cranial Nerves: No cranial nerve deficit.     Motor: No weakness.     Gait: Gait abnormal (Antalgic, favoring left knee).  Psychiatric:        Mood and Affect: Mood normal.        Thought Content: Thought content normal.        Judgment: Judgment normal.     Adult ECG Report  Rate: 72;  Rhythm: normal sinus rhythm, sinus arrhythmia, and normal axis, intervals and durations. ;   Narrative Interpretation: Stable  Recent Labs: Reviewed 09/21/2020: TC 170, TG 48, HDL 82, LDL 77.  A1c 8.0.  Hgb 12.3, BUN 25, Cr 1.15, K+ 4.0.  AST/ALT 16/15.  Alk  phos 75 01/12/2021-A1c 7.1 (notably improved). Cr 1.2 No results found for: CHOL, HDL, LDLCALC, LDLDIRECT, TRIG, CHOLHDL Lab Results  Component Value Date   CREATININE 1.12 (H) 01/27/2021   BUN 23 01/27/2021   NA 135 01/27/2021   K 4.2 01/27/2021   CL 100 01/27/2021   CO2 24 01/27/2021   CBC Latest Ref Rng & Units 01/27/2021 03/21/2020 04/19/2018  WBC 3.4 - 10.8 x10E3/uL 3.2(L) 3.8(L) 2.8(L)  Hemoglobin 11.1 - 15.9 g/dL 12.3 12.4 12.8  Hematocrit 34.0 - 46.6 % 39.4 40.5 42.0  Platelets 150 - 450 x10E3/uL 200 193 161    Lab Results  Component Value Date   HGBA1C 6.8 (H) 08/03/2014   No results found for: TSH  ==================================================  COVID-19 Education: The signs and symptoms of COVID-19 were discussed with the patient and how to seek care for testing (follow up with PCP or arrange E-visit).    I spent a total of 48  minutes with the patient spent in direct patient consultation.  Additional time spent with chart review  / charting (studies, outside notes, etc): 19 min Total Time: 67 min  Current medicines are reviewed at length with the patient today.  (+/- concerns) N/A  This visit occurred during the SARS-CoV-2 public health emergency.  Safety protocols were in place, including screening questions prior to the visit, additional usage of staff PPE, and extensive cleaning of exam room while observing appropriate contact time as indicated for disinfecting solutions.  Notice: This dictation was prepared with Dragon dictation along with smart phrase technology. Any transcriptional errors that result from this process are unintentional and may not be corrected upon review.   Studies Ordered:  Orders Placed This Encounter  Procedures   ECHOCARDIOGRAM COMPLETE     Patient Instructions / Medication Changes & Studies & Tests Ordered   Patient Instructions  Medication Instructions:    Changes include:   Increase Carvedilol 6.25 mg  twice a day    ( may take 2 tablets of 3.125 mg to equal 6.25 mg  to use current bottle)     Start taking Aspirin 81 mg  - take 30 min prior to taking  Isosorbide mono. 30 mg at bedtime  Start taking Isosorbide Mononitrate 30 mg  at bedtime   Start taking Ranexa ( Ranolazine ) 500 mg  take twice a day  about 12 hours apart if possible   Use Nitroglycerin sublingual tablets 0.4 mg as needed see instruction below    *If you  need a refill on your cardiac medications before your next appointment, please call your pharmacy*   Lab Work: Not needed    Testing/Procedures: Will be schedule at [place] has requested that you have an echocardiogram. Echocardiography is a painless test that uses sound waves to create images of your heart. It provides your doctor with information about the size and shape of your heart and how well your heart's chambers and valves are working. This procedure takes approximately one hour. There are no restrictions for this procedure.    Follow-Up: At [facility], you and your health needs are our priority.  As part of our continuing mission to provide you with exceptional heart care, we have created designated Provider Care Teams.  These Care Teams include your primary Cardiologist (physician) and Advanced Practice Providers (APPs -  Physician Assistants and Nurse Practitioners) who all work together to provide you with the care you need, when you need it.     Your next appointment:   1 month(s)  The format for your next appointment:   In Person  Provider:   None    Other Instructions       Glenetta Hew, M.D., M.S. Interventional Cardiologist   Pager # [phone] Phone # [phone] [address] [place], [place] [address]   Thank you for choosing Heartcare at [facility]!!

## 2021-03-09 DIAGNOSIS — Z794 Long term (current) use of insulin: Secondary | ICD-10-CM | POA: Diagnosis not present

## 2021-03-09 DIAGNOSIS — E113593 Type 2 diabetes mellitus with proliferative diabetic retinopathy without macular edema, bilateral: Secondary | ICD-10-CM | POA: Diagnosis not present

## 2021-03-09 DIAGNOSIS — H900 Conductive hearing loss, bilateral: Secondary | ICD-10-CM | POA: Diagnosis not present

## 2021-03-09 DIAGNOSIS — I10 Essential (primary) hypertension: Secondary | ICD-10-CM | POA: Diagnosis not present

## 2021-03-09 DIAGNOSIS — E669 Obesity, unspecified: Secondary | ICD-10-CM | POA: Diagnosis not present

## 2021-03-09 DIAGNOSIS — I25118 Atherosclerotic heart disease of native coronary artery with other forms of angina pectoris: Secondary | ICD-10-CM | POA: Diagnosis not present

## 2021-03-10 DIAGNOSIS — E113593 Type 2 diabetes mellitus with proliferative diabetic retinopathy without macular edema, bilateral: Secondary | ICD-10-CM | POA: Diagnosis not present

## 2021-03-10 DIAGNOSIS — H3582 Retinal ischemia: Secondary | ICD-10-CM | POA: Diagnosis not present

## 2021-03-10 DIAGNOSIS — H43822 Vitreomacular adhesion, left eye: Secondary | ICD-10-CM | POA: Diagnosis not present

## 2021-03-10 DIAGNOSIS — H4313 Vitreous hemorrhage, bilateral: Secondary | ICD-10-CM | POA: Diagnosis not present

## 2021-03-25 ENCOUNTER — Encounter: Payer: Self-pay | Admitting: Cardiology

## 2021-03-25 NOTE — Assessment & Plan Note (Signed)
I think this is probably related to deconditioning, but she also has essentially a subtotaled small caliber codominant RCA which may contribute some to that her dyspnea.  Plan:   Check 2D echocardiogram  Titrate antianginal medications as noted.

## 2021-03-25 NOTE — Assessment & Plan Note (Signed)
Exertional chest discomfort which may or may not be cardiac in nature.  Difficult to tell if this is angina versus noncardiac chest pain, but with diffuse disease It is possible that she does have microvascular ischemia.  Unfortunate not a PCI target.  Plan: Increase carvedilol to 625 mg twice daily, add Imdur 30 mg daily and Ranexa 500 mg twice daily

## 2021-03-25 NOTE — Assessment & Plan Note (Signed)
Somewhat atypical chest pain which may or may not be true angina.  She may have microvascular disease.  The RCA is relatively small caliber vessel with diffuse disease.  Not very favorable for PCI simply because the extent PCI will be required.  Likely 2.0 to 2.25 mm vessel and codominant.  Recommend aggressive medical management. Plan:  Start aspirin 81 mg  Increase carvedilol to 6.25 mg twice daily  Continue current dose of atorvastatin but likely titrate either to 80 mg or switch to rosuvastatin 40 mg based on upcoming LABS.  Continue ARB  Add Imdur 30 mg nightly and Ranexa 500 mg twice daily

## 2021-03-25 NOTE — Assessment & Plan Note (Addendum)
Stable blood pressure on ARB and recently started beta-blocker.  Will increase to 6.25 mg twice daily carvedilol since she is still having chest discomfort off and on and heart rate is in the 80s.Marland Kitchen

## 2021-03-25 NOTE — Assessment & Plan Note (Signed)
Now confirmed essentially obstructive CAD.  Target LDL should be less than 55, with acceptable level less than 70.  Most recent check was 63 in June.  She is due for recheck of lipids.  Anticipate adjusting dosing if not at goal.  She is on insulin and Trulicity, consider SGLT2 inhibitor.

## 2021-03-29 ENCOUNTER — Ambulatory Visit (HOSPITAL_COMMUNITY): Payer: Medicare Other | Attending: Cardiovascular Disease

## 2021-03-29 ENCOUNTER — Other Ambulatory Visit: Payer: Self-pay

## 2021-03-29 DIAGNOSIS — I208 Other forms of angina pectoris: Secondary | ICD-10-CM

## 2021-03-29 DIAGNOSIS — I25119 Atherosclerotic heart disease of native coronary artery with unspecified angina pectoris: Secondary | ICD-10-CM | POA: Diagnosis not present

## 2021-03-29 DIAGNOSIS — R0609 Other forms of dyspnea: Secondary | ICD-10-CM

## 2021-03-29 HISTORY — PX: TRANSTHORACIC ECHOCARDIOGRAM: SHX275

## 2021-03-29 LAB — ECHOCARDIOGRAM COMPLETE
Area-P 1/2: 3.99 cm2
S' Lateral: 3.3 cm

## 2021-04-04 DIAGNOSIS — Z23 Encounter for immunization: Secondary | ICD-10-CM | POA: Diagnosis not present

## 2021-04-08 DIAGNOSIS — I10 Essential (primary) hypertension: Secondary | ICD-10-CM | POA: Diagnosis not present

## 2021-04-08 DIAGNOSIS — I25118 Atherosclerotic heart disease of native coronary artery with other forms of angina pectoris: Secondary | ICD-10-CM | POA: Diagnosis not present

## 2021-04-26 ENCOUNTER — Other Ambulatory Visit: Payer: Self-pay

## 2021-04-26 ENCOUNTER — Encounter: Payer: Self-pay | Admitting: Cardiology

## 2021-04-26 ENCOUNTER — Ambulatory Visit (INDEPENDENT_AMBULATORY_CARE_PROVIDER_SITE_OTHER): Payer: Medicare Other | Admitting: Cardiology

## 2021-04-26 VITALS — BP 110/58 | HR 84 | Ht 65.5 in | Wt 201.2 lb

## 2021-04-26 DIAGNOSIS — R0609 Other forms of dyspnea: Secondary | ICD-10-CM | POA: Diagnosis not present

## 2021-04-26 DIAGNOSIS — E1169 Type 2 diabetes mellitus with other specified complication: Secondary | ICD-10-CM

## 2021-04-26 DIAGNOSIS — R5383 Other fatigue: Secondary | ICD-10-CM | POA: Diagnosis not present

## 2021-04-26 DIAGNOSIS — I10 Essential (primary) hypertension: Secondary | ICD-10-CM | POA: Diagnosis not present

## 2021-04-26 DIAGNOSIS — E785 Hyperlipidemia, unspecified: Secondary | ICD-10-CM

## 2021-04-26 DIAGNOSIS — I208 Other forms of angina pectoris: Secondary | ICD-10-CM

## 2021-04-26 DIAGNOSIS — I25119 Atherosclerotic heart disease of native coronary artery with unspecified angina pectoris: Secondary | ICD-10-CM | POA: Diagnosis not present

## 2021-04-26 MED ORDER — CARVEDILOL 6.25 MG PO TABS: ORAL_TABLET | ORAL | 3 refills | Status: DC

## 2021-04-26 MED ORDER — ISOSORBIDE MONONITRATE ER 60 MG PO TB24: 60.0000 mg | ORAL_TABLET | Freq: Every day | ORAL | 11 refills | Status: DC

## 2021-04-26 NOTE — Assessment & Plan Note (Signed)
Epworth score only 8 although she has poor sleep etiquette.  Not likely have OSA based on Epworth score, however needs to work on getting more appropriate sleep.  We talked about this a little bit.  May even benefit from sleep medicine consultation. For now we will back down on carvedilol dose in the morning to allow for more blood pressure responsiveness. May have to consider statin holiday to see if this is causing a problem, however the fatigue predated her being on statin.

## 2021-04-26 NOTE — Assessment & Plan Note (Signed)
Resting and exertional chest discomfort.  Hard to tell if this is true cardiac chest pain or not.  We have her on Imdur which I am increasing the dose to 60 mg along with Ranexa 500 mg twice daily.  Overall things seem to be doing better.  I think she really needs now to gradually build up her exercise level and improve her exercise tolerance.

## 2021-04-26 NOTE — Assessment & Plan Note (Signed)
Normal echo.  Small caliber either codominant or nondominant RCA unlikely to cause this much of dyspnea.  I think most of this is related to deconditioning and even potentially some anhedonia from borderline depression.  Encouraged her to increase her level of exercise trying to do at least 10 to 15 minutes at a time and building up from there to try to get a whole half an hour most days a week.

## 2021-04-26 NOTE — Progress Notes (Signed)
Primary Care Provider: Leeroy Cha, [facility] Cardiologist: Glenetta Hew, MD.  Previously seen by Dr. Percival Spanish for syncope in 2018 Electrophysiologist: None Orthopedic Surgeon: Dr. [facility]  Clinic Note: Chief Complaint  Patient presents with   Follow-up    Echo results   Coronary Artery Disease    Still has intermittent chest pain, notable exertional dyspnea and fatigue     ===================================  ASSESSMENT/PLAN   Problem List Items Addressed This Visit       Cardiology Problems   Hyperlipidemia associated with type 2 diabetes mellitus ([place]) (Chronic)    Now the Strader CAD, target LDL less than 55 at a minimum less than 70.  Most recent labs showed LDL of 55.  Pretty much at goal on current dose of atorvastatin.  Tolerating well.  Continues on Trulicity and insulin and metformin for diabetes.  Unfortunately, no notable weight loss.      Relevant Medications   carvedilol (COREG) 6.25 MG tablet   isosorbide mononitrate (IMDUR) 60 MG 24 hr tablet   Essential hypertension (Chronic)    Blood pressure well controlled with current dose of losartan and carvedilol along with Imdur.  However with her having extreme fatigue not plans to back off on her daytime dose of carvedilol to allow for more heart rate responsiveness.  Plan: Reduce daytime dose of carvedilol to 1/2 tablet (3.25 mg), and continue full 6.25 mg a.m. Further titrate Imdur to 60 mg-switch to nighttime dose, 30 minutes after aspirin Continue losartan.      Relevant Medications   carvedilol (COREG) 6.25 MG tablet   isosorbide mononitrate (IMDUR) 60 MG 24 hr tablet   Coronary artery disease involving native coronary artery of native heart with angina pectoris ([place]) - Primary (Chronic)    Continues to have chest pain which is somewhat atypical.  Hard to tell if it is anginal or not.  It is hard to explain all of her found fatigue and dyspnea etc. on disease had  a small caliber RCA.  However she could have some microvascular ischemia and spasm.  Plan: Continue current dose of carvedilol but back down the morning dose for additional heart rate responsiveness related to fatigue. Increase Imdur dose to 60 mg-nighttime dose Continue Ranexa 500 g twice daily. Continue losartan 100 mg rectal reduction Continue aspirin ->  okay to hold aspirin 5 days preop for surgeries or procedures.      Relevant Medications   carvedilol (COREG) 6.25 MG tablet   isosorbide mononitrate (IMDUR) 60 MG 24 hr tablet   Chronic stable angina (HCC) (Chronic)    Resting and exertional chest discomfort.  Hard to tell if this is true cardiac chest pain or not.  We have her on Imdur which I am increasing the dose to 60 mg along with Ranexa 500 mg twice daily.  Overall things seem to be doing better.  I think she really needs now to gradually build up her exercise level and improve her exercise tolerance.      Relevant Medications   carvedilol (COREG) 6.25 MG tablet   isosorbide mononitrate (IMDUR) 60 MG 24 hr tablet     Other   Fatigue    Epworth score only 8 although she has poor sleep etiquette.  Not likely have OSA based on Epworth score, however needs to work on getting more appropriate sleep.  We talked about this a little bit.  May even benefit from sleep medicine consultation. For now we will back down on carvedilol dose  in the morning to allow for more blood pressure responsiveness. May have to consider statin holiday to see if this is causing a problem, however the fatigue predated her being on statin.       DOE (dyspnea on exertion) (Chronic)    Normal echo.  Small caliber either codominant or nondominant RCA unlikely to cause this much of dyspnea.  I think most of this is related to deconditioning and even potentially some anhedonia from borderline depression.  Encouraged her to increase her level of exercise trying to do at least 10 to 15 minutes at a time and  building up from there to try to get a whole half an hour most days a week.     ===================================  HPI:    Karen Gardner is a 66 y.o. female with a PMH notable for Single-Vessel CAD involving Small Nondominant RCA, Previous Diagnosis of Costochondritis DM-2 (complicated by Diabetic Retinopathy), HTN and h/o Vasovagal Syncope (December '21) who presents for close follow-up to discuss results of echocardiogram  Initial Consultation December 30, 2020 at the request of Dr. Fara Olden due to elevated Coronary Calcium Score and abnormal EKG.  Initial plan was possible preop evaluation for potential knee surgery which she subsequently decided not to have done. => Noted exertional dyspnea, fatigue and ongoing chest pain intermittently.  => Evaluated with Coronary CT Angiogram suggesting severe RCA disease (potential occlusion)   => referred for Cardiac Catheterization confirmed severe RCA disease, but in a relatively small caliber nondominant vessel with possible distal occlusion.  Not a PCI target based on extensive disease and small vessel.  Plan for medical management.   Karen Gardner Was last seen on March 03, 2021 for post-cath follow-up -she noted strange off-and-on episodes of discomfort in her chest that were somewhat concerning.  She was somewhat concerned about the finding of an occluded coronary artery.  I explained the plan for medical management and that her RCA is relatively small.  Not a good candidate for PCI.  She had not started the medications that were indicated. => Still noticing chest pain (not associated with exertion), but also some irregular heartbeats and exertional dyspnea. 2D echo ordered because of dyspnea.  Recent Hospitalizations:  none  Reviewed  CV studies:    The following studies were reviewed today: (if available, images/films reviewed: From Epic Chart or Care Everywhere) TTE 03/29/2021: EF 55 to 60%.  No R WMA-normal strain pattern.  GR  1 DD.  Normal RV with RV P mild aortic sclerosis but no stenosis no AI.  Mildly elevated RAP.  Interval History:   Karen Gardner is here today for follow-up after echocardiogram-she is accompanied by her oldest sister today.  She says overall she is doing somewhat better, still has episodic pain.  She says she is having some chest pain today and had some yesterday for which she took nitroglycerin-not really having much relief.  The most notable thing is that she just feels tired all the time has no real energy.  And gets little short of breath with exertion.  She is extremely deconditioned now and has not done any exercise for quite some time.  She also has pretty poor sleep habits.  We did Epworth score today was only 8. ->  She says that she goes to bed about 8 but maybe does go to sleep until midnight, indicating that she spends long time watching TV.  As such, she probably is not getting the amount of sleep that she thinks  she is.  Clearly needs some discussion about sleep etiquette.  Is hard to tell with her chest pain, because it seems to occur a lot at rest and not with exertion.  But this is also because she does not do exertion.  She is indicated having some chest pain today which is somewhat reproducible on exam and she does have a history of costochondritis.  No significant palpitations although they are occasionally there.  No heart failure symptoms.  Cardiovascular ROS: positive for - chest pain, dyspnea on exertion, irregular heartbeat, palpitations, and Although chest pain is not necessarily associated with exertion. negative for - edema, orthopnea, paroxysmal nocturnal dyspnea, rapid heart rate, shortness of breath, or syncope/near syncope or TIA/#V. tach, claudication   REVIEWED OF SYSTEMS   Review of Systems  Constitutional:  Positive for malaise/fatigue (Hard to tell a lot because she is limited by knee pain as far as exercise, but she seems tired all the time, significant  exercise intolerance.). Negative for weight loss (Thankfully, her weight is maintaining somewhat steady levels.).  HENT:  Negative for congestion (Only with seasonal allergies.) and nosebleeds.   Respiratory:  Positive for cough (Seasonal allergies-nonproductive.) and shortness of breath (Extremely deconditioned.  Exertional dyspnea). Negative for wheezing (Sometimes when she has bad allergies she wheezes but not routinely.).   Cardiovascular:  Positive for chest pain (Chest pain episodes as noted above.) and palpitations. Orthopnea: She sleeps on 3 pillows more for comfort than dyspnea..      Per HPI  Gastrointestinal:  Negative for abdominal pain, blood in stool and melena.  Genitourinary:  Negative for hematuria.  Musculoskeletal:  Positive for joint pain (She had surgery on the right knee which is stiff, and she has chronic left knee pain-reluctant to undergo surgery.). Negative for falls.  Neurological:  Positive for focal weakness (Left leg because of her knee.). Negative for dizziness and headaches.  Endo/Heme/Allergies:  Positive for environmental allergies.  Psychiatric/Behavioral:  Positive for depression (Question some symptoms of dysthymia.  She has poor sleep habits, not eating well, not doing activities of pleasure (anhedonia)). Negative for memory loss. The patient has insomnia (Hard time falling asleep-see HPI). The patient is not nervous/anxious.    I have reviewed and (if needed) personally updated the patient's problem list, medications, allergies, past medical and surgical history, social and family history.   PAST MEDICAL HISTORY   Past Medical History:  Diagnosis Date   Allergic rhinitis    Anxiety    Arthritis    Asthma    childhood   Chronic low back pain    CKD (chronic kidney disease), stage III ([place])    However, most recent creatinine 1.5.   Coronary artery disease involving native heart with other form of angina pectoris, unspecified vessel or lesion type [facility])  11/16/2020   11/16/2020: CORONARY CA++ SCORE: Agatston Score 2245.  LAD 1093, LCx 959, RCA 193 (99th percentile) -> COR CTA (Agatston 2437) -moderate to severe multivessel CAD with significant blooming in all 3 epicardial vessels.  CAD RADS 3-4. FFRCT suggests mid RCA occlusion, => CARDIAC CATH 02/02/21: RCA: prox 70%, Mid-distal 90% & distal 90% (small caliber, Not viable PCI target; Prox RI 50%.   Depression    With anxiety   GERD (gastroesophageal reflux disease)    Headache    sinus headaches    Hypercholesterolemia    Hypertension    Iron deficiency anemia 05/26/2015   Has required transfusions-followed by Dr. Marin Olp   Iron malabsorption 05/26/2015   Menopause  Numbness in both hands    mostly at night   Obesity    Osteoarthritis of both knees    Status post right TKA (and ankle) with plans for left TKA   Type 2 diabetes mellitus without complication, with long-term current use of insulin (HCC)    Type 2 on insulin pump   Vasovagal syncope 2018   Negative work-up   Vitamin D deficiency     PAST SURGICAL HISTORY   Past Surgical History:  Procedure Laterality Date   COLONOSCOPY     EYE SURGERY Bilateral    [place]    HERNIA REPAIR     umb hernia as child   KNEE ARTHROSCOPY  02/12/2012   Procedure: ARTHROSCOPY KNEE;  Surgeon: Kerin Salen, MD;  Location: [facility];  Service: Orthopedics;  Laterality: Right;  Partial Lateral Meniscectomy, Debridement chondromalacia   LAPAROSCOPIC GASTRIC SLEEVE RESECTION N/A 03/14/2016   Procedure: LAPAROSCOPIC GASTRIC SLEEVE RESECTION, WITH REPAIR OF HIATAL HERNIA REPAI, AND UPPER ENDO;  Surgeon: Johnathan Hausen, MD;  Location: [facility];  Service: General;  Laterality: N/A;   LEFT HEART CATH AND CORONARY ANGIOGRAPHY N/A 02/02/2021   Procedure: LEFT HEART CATH AND CORONARY ANGIOGRAPHY;  Surgeon: Leonie Man, MD;  Location: [facility];; Heavily calcified tortuous codominant RCA with proximal 70% stenosis.  Mid to  distal RCA 90% stenosis.  Distal RCA 90%.  Not a PCI target.  50% RI, proximal to mid LAD 20%.  Mid to distal LCx 30% with 30% in LP AV-heavily calcified.  EF 55-65%.  Mildly elevated LVEDP.   ORIF ANKLE FRACTURE Right 02/11/2014   Procedure: OPEN REDUCTION INTERNAL FIXATION (ORIF) RIGHT ANKLE FRACTURE;  Surgeon: Kerin Salen, MD;  Location: [facility];  Service: Orthopedics;  Laterality: Right;   TOTAL KNEE ARTHROPLASTY Right 08/05/2014   Procedure: TOTAL KNEE ARTHROPLASTY;  Surgeon: Frederik Pear, MD;  Location: [facility];  Service: Orthopedics;  Laterality: Right;   TRANSTHORACIC ECHOCARDIOGRAM  05/2016   EF 60 to 65%.  Severe basal septal LVH with mild concentric LVH.  No R WMA.  GR 1 DD.  Indeterminate filling pressures. Minimal valve disease.   TRANSTHORACIC ECHOCARDIOGRAM  03/29/2021   EF 55 to 60%.  No R WMA-normal strain pattern.  GR 1 DD.  Normal RV with RV P mild aortic sclerosis but no stenosis no AI.  Mildly elevated RAP.   TRIGGER FINGER RELEASE Right 10/14/2018   Procedure: RELEASE TRIGGER FINGER/A-1 PULLEY;  Surgeon: Leanora Cover, MD;  Location: [facility];  Service: Orthopedics;  Laterality: Right;   UPPER GI ENDOSCOPY     Coronary CT Angiogram 01/17/2021:  Coronary calcium score 2437 (99th percentile).  Moderate and possibly severe coronary artery atherosclerosis.  Significant blooming artifact in all 3 epicardial vessels.  CAD RADS 3-4.  Recommend Cardiac Catheterization. FFRCT :  LM -0.99; (p-m-d) LAD  0.97, 0.93, 0.87; LCx 0.9, 0.96, 0.82.; RI 0.98, 0.86, 0.82.RCA 0.99,? mid Occluded - However there does appear to be some distal flow in the RCA suggesting possible collateral flow. Cardiac Catheterization Recommended  Cardiac Cath 02/02/2021: Heavily calcified tortuous codominant RCA with proximal 70% stenosis.  Mid to distal RCA 90% stenosis.  Distal RCA 90%.  Not a PCI target.  50% RI, proximal to mid LAD 20%.  Mid to distal LCx 30% with 30% in LP AV-heavily calcified.  EF  55-65%.  Mildly elevated LVEDP. => RCA was not PCI target.  Recommended aggressive risk factor modification.  Started on  carvedilol.  Along with aspirin.   Immunization History  Administered Date(s) Administered   Influenza-Unspecified 01/07/2014, 12/03/2014   PPD Test 02/14/2014   Pneumococcal-Unspecified 02/14/2014    MEDICATIONS/ALLERGIES   Current Meds  Medication Sig   aspirin EC 81 MG tablet Take 1 tablet (81 mg total) by mouth daily. 30 minutes prior to taking Isosorbide   atorvastatin (LIPITOR) 40 MG tablet Take 40 mg by mouth at bedtime.    CALCIUM PO Take 1 capsule by mouth 3 (three) times daily.    Continuous Blood Gluc Sensor (FREESTYLE LIBRE 14 DAY SENSOR) MISC Apply topically every 14 (fourteen) days.   DEBROX 6.5 % OTIC solution 5 drops 2 (two) times daily.   diphenhydrAMINE (BENADRYL) 25 MG tablet Take 25 mg by mouth every 6 (six) hours as needed for allergies.   Dulaglutide (TRULICITY) 3.97 QB/3.4LP SOPN Inject 0.75 mg into the skin once a week. Managed by PCP   escitalopram (LEXAPRO) 10 MG tablet Take 10 mg by mouth at bedtime.    fluticasone (FLONASE) 50 MCG/ACT nasal spray SPRAY SPRAY 1 SPRAY INTO EACH NOSTRIL EVERY DAY FOR 30 DAYS   glucose blood (ONETOUCH VERIO) test strip Use as instructed to check blood sugar once a day dx code E11.65   Homeopathic Products (LEG CRAMPS) TABS Take 1 tablet by mouth at bedtime.   insulin lispro (HUMALOG) 100 UNIT/ML KwikPen Inject 8 Units into the skin 3 (three) times daily.   isosorbide mononitrate (IMDUR) 60 MG 24 hr tablet Take 1 tablet (60 mg total) by mouth at bedtime. 30 min after taking Aspirin 81 mg   lansoprazole (PREVACID) 15 MG capsule Take 15 mg by mouth at bedtime.    losartan (COZAAR) 100 MG tablet Take 100 mg by mouth at bedtime.   metFORMIN (GLUCOPHAGE) 500 MG tablet Take by mouth at bedtime.   Multiple Vitamin (MULTIVITAMIN WITH MINERALS) TABS tablet Take 1 tablet by mouth 2 (two) times daily.   nitroGLYCERIN  (NITROSTAT) 0.4 MG SL tablet Place 1 tablet (0.4 mg total) under the tongue every 5 (five) minutes as needed for chest pain.   Ranolazine ER 500 MG PACK Take 500 mg by mouth in the morning and at bedtime.   [DISCONTINUED] carvedilol (COREG) 6.25 MG tablet Take 1 tablet (6.25 mg total) by mouth 2 (two) times daily.   [DISCONTINUED] isosorbide mononitrate (IMDUR) 30 MG 24 hr tablet Take 1 tablet (30 mg total) by mouth at bedtime.    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  Previously worked as a Theme park manager.   She is single, lives alone.  Used to exercise by walking frequently, but with her knee arthritis, not walking as much.   Family History  Problem Relation Age of Onset  Alcohol abuse Father    Heart attack Sister 67    OBJCTIVE -PE, EKG, labs   Wt Readings from Last 3 Encounters:  04/26/21 201 lb 3.2 oz (91.3 kg)  03/03/21 205 lb (93 kg)  02/02/21 202 lb (91.6 kg)    Physical Exam: BP (!) 110/58    Pulse 84    Ht 5' 5.5" (1.664 m)    Wt 201 lb 3.2 oz (91.3 kg)    SpO2 97%    BMI 32.97 kg/m  Physical Exam Vitals reviewed.  Constitutional:      General: She is not in acute distress (Seems to be doing okay but says she is tired and having chest pain.Marland Kitchen).    Appearance: Normal appearance. She is obese. She is not ill-appearing (Healthy-appearing) or toxic-appearing.     Comments:  Well-nourished and well-groomed.  Mildly obese.    HENT:     Head: Normocephalic and atraumatic.  Neck:     Vascular: No carotid bruit, hepatojugular reflux or JVD.  Cardiovascular:     Rate and Rhythm: Normal rate and regular rhythm. No extrasystoles are present.    Chest Wall: PMI is not displaced.     Pulses: Normal pulses.     Heart sounds: S1 normal and S2 normal. Heart sounds are distant. No murmur heard.   No friction rub. No gallop.  Pulmonary:     Effort: Pulmonary effort is normal. No respiratory distress.     Breath sounds: Normal breath sounds. No wheezing, rhonchi or rales.  Chest:     Chest wall: Tenderness (She still has point tenderness on [place] sternal border on the right and left.) present.  Abdominal:     Comments: Obese.  Difficult to assess HSM  Musculoskeletal:        General: Swelling (Trivial ankle swelling) and tenderness (Left knee) present.     Cervical back: Normal range of motion and neck supple.  Skin:    General: Skin is warm and dry.     Coloration: Skin is not jaundiced.  Neurological:     General: No focal deficit present.     Mental Status: She is alert and oriented to person, place, and time.     Gait: Gait abnormal (Antalgic, favoring left knee).  Psychiatric:        Behavior: Behavior normal.        Thought Content: Thought content normal.        Judgment: Judgment normal.     Comments: Mood seems a little down.  Less anxious and nervous than last visit however.     Adult ECG Report Not checked  Recent Labs: Previous labs reviewed.  New labs from 04/08/2021 04/08/2021: TC 144, TG 42, HDL 77, LDL 57.-Notably improved 01/12/2021-A1c 7.1 (notably improved). Cr 1.2 09/21/2020: TC 170, TG 48, HDL 82, LDL 77.  A1c 8.0.  Hgb 12.3, BUN 25, Cr 1.15, K+ 4.0.  AST/ALT 16/15.  Alk phos 75  Lab Results  Component Value Date   CREATININE 1.12 (H) 01/27/2021   BUN 23 01/27/2021   NA 135 01/27/2021   K 4.2 01/27/2021   CL 100 01/27/2021   CO2 24  01/27/2021   CBC Latest Ref Rng & Units 01/27/2021 03/21/2020 04/19/2018  WBC 3.4 - 10.8 x10E3/uL 3.2(L) 3.8(L) 2.8(L)  Hemoglobin 11.1 - 15.9 g/dL 12.3 12.4 12.8  Hematocrit 34.0 - 46.6 % 39.4 40.5 42.0  Platelets 150 - 450 x10E3/uL 200 193 161     No results found for: TSH  ==================================================  COVID-19 Education: The signs and symptoms of COVID-19 were discussed with the patient and how to seek care for testing (follow up with PCP or arrange E-visit).    I spent a total of 40 minutes with the patient spent in direct patient consultation.  Additional time spent with chart review  / charting (studies, outside notes, etc): 33 min Total Time: 68 min  Current medicines are reviewed at length with the patient today.  (+/- concerns) N/A  This visit occurred during the SARS-CoV-2 public health emergency.  Safety protocols were in place, including screening questions prior to the visit, additional usage of staff PPE, and extensive cleaning of exam room while observing appropriate contact time as indicated for disinfecting solutions.  Notice: This dictation was prepared with Dragon dictation along with smart phrase technology. Any transcriptional errors that result from this process are unintentional and may not be corrected upon review.   Studies Ordered:  No orders of the defined types were placed in this encounter.    Patient Instructions / Medication Changes & Studies & Tests Ordered   Patient Instructions  Medication Instructions:    Decrease carvedilol  to 3.125 mg ( 1/2 tablet)  in the morning , 6.25 mg  in the evening   Increase  Imdur ( isosorbide mono) 60 mg    at bedtime  take  daily Aspirin  30 min prior to  taking medication    *If you need a refill on your cardiac medications before your next appointment, please call your pharmacy*   Lab Work: Not needed If you have labs (blood work) drawn today and your tests are completely normal,  you will receive your results only by: MyChart Message (if you have MyChart) OR A paper copy in the mail If you have any lab test that is abnormal or we need to change your treatment, we will call you to review the results.   Testing/Procedures:  Not needed  Follow-Up: At [facility], you and your health needs are our priority.  As part of our continuing mission to provide you with exceptional heart care, we have created designated Provider Care Teams.  These Care Teams include your primary Cardiologist (physician) and Advanced Practice Providers (APPs -  Physician Assistants and Nurse Practitioners) who all work together to provide you with the care you need, when you need it.     Your next appointment:   5 month(s) June 2023  The format for your next appointment:   In Person  Provider:   Glenetta Hew, MD        Glenetta Hew, M.D., M.S. Interventional Cardiologist   Pager # [phone] Phone # [phone] [address] [place], [place] [address]   Thank you for choosing Heartcare at [facility]!!

## 2021-04-26 NOTE — Assessment & Plan Note (Signed)
Now the Strader CAD, target LDL less than 55 at a minimum less than 70.  Most recent labs showed LDL of 55.  Pretty much at goal on current dose of atorvastatin.  Tolerating well.  Continues on Trulicity and insulin and metformin for diabetes.  Unfortunately, no notable weight loss.

## 2021-04-26 NOTE — Patient Instructions (Addendum)
Medication Instructions:    Decrease carvedilol  to 3.125 mg ( 1/2 tablet)  in the morning , 6.25 mg  in the evening   Increase  Imdur ( isosorbide mono) 60 mg    at bedtime  take  daily Aspirin  30 min prior to  taking medication    *If you need a refill on your cardiac medications before your next appointment, please call your pharmacy*   Lab Work: Not needed If you have labs (blood work) drawn today and your tests are completely normal, you will receive your results only by: MyChart Message (if you have MyChart) OR A paper copy in the mail If you have any lab test that is abnormal or we need to change your treatment, we will call you to review the results.   Testing/Procedures:  Not needed  Follow-Up: At [facility], you and your health needs are our priority.  As part of our continuing mission to provide you with exceptional heart care, we have created designated Provider Care Teams.  These Care Teams include your primary Cardiologist (physician) and Advanced Practice Providers (APPs -  Physician Assistants and Nurse Practitioners) who all work together to provide you with the care you need, when you need it.     Your next appointment:   5 month(s) June 2023  The format for your next appointment:   In Person  Provider:   Bryan Lemma, MD

## 2021-04-26 NOTE — Assessment & Plan Note (Signed)
Continues to have chest pain which is somewhat atypical.  Hard to tell if it is anginal or not.  It is hard to explain all of her found fatigue and dyspnea etc. on disease had a small caliber RCA.  However she could have some microvascular ischemia and spasm.  Plan:  Continue current dose of carvedilol but back down the morning dose for additional heart rate responsiveness related to fatigue.  Increase Imdur dose to 60 mg-nighttime dose  Continue Ranexa 500 g twice daily.  Continue losartan 100 mg rectal reduction  Continue aspirin ->   okay to hold aspirin 5 days preop for surgeries or procedures.

## 2021-04-26 NOTE — Assessment & Plan Note (Signed)
Blood pressure well controlled with current dose of losartan and carvedilol along with Imdur.  However with her having extreme fatigue not plans to back off on her daytime dose of carvedilol to allow for more heart rate responsiveness.  Plan: Reduce daytime dose of carvedilol to 1/2 tablet (3.25 mg), and continue full 6.25 mg a.m.  Further titrate Imdur to 60 mg-switch to nighttime dose, 30 minutes after aspirin  Continue losartan.

## 2021-07-04 DIAGNOSIS — H43813 Vitreous degeneration, bilateral: Secondary | ICD-10-CM | POA: Diagnosis not present

## 2021-07-04 DIAGNOSIS — H43823 Vitreomacular adhesion, bilateral: Secondary | ICD-10-CM | POA: Diagnosis not present

## 2021-07-04 DIAGNOSIS — H43392 Other vitreous opacities, left eye: Secondary | ICD-10-CM | POA: Diagnosis not present

## 2021-07-04 DIAGNOSIS — H4312 Vitreous hemorrhage, left eye: Secondary | ICD-10-CM | POA: Diagnosis not present

## 2021-07-04 DIAGNOSIS — E113593 Type 2 diabetes mellitus with proliferative diabetic retinopathy without macular edema, bilateral: Secondary | ICD-10-CM | POA: Diagnosis not present

## 2021-07-04 DIAGNOSIS — H31091 Other chorioretinal scars, right eye: Secondary | ICD-10-CM | POA: Diagnosis not present

## 2021-07-11 DIAGNOSIS — Z20822 Contact with and (suspected) exposure to covid-19: Secondary | ICD-10-CM | POA: Diagnosis not present

## 2021-07-14 DIAGNOSIS — E113593 Type 2 diabetes mellitus with proliferative diabetic retinopathy without macular edema, bilateral: Secondary | ICD-10-CM | POA: Diagnosis not present

## 2021-07-14 DIAGNOSIS — H4313 Vitreous hemorrhage, bilateral: Secondary | ICD-10-CM | POA: Diagnosis not present

## 2021-07-14 DIAGNOSIS — H43823 Vitreomacular adhesion, bilateral: Secondary | ICD-10-CM | POA: Diagnosis not present

## 2021-07-14 DIAGNOSIS — H35372 Puckering of macula, left eye: Secondary | ICD-10-CM | POA: Diagnosis not present

## 2021-07-18 ENCOUNTER — Other Ambulatory Visit: Payer: Self-pay | Admitting: Cardiology

## 2021-07-20 DIAGNOSIS — Z794 Long term (current) use of insulin: Secondary | ICD-10-CM | POA: Diagnosis not present

## 2021-07-20 DIAGNOSIS — M8580 Other specified disorders of bone density and structure, unspecified site: Secondary | ICD-10-CM | POA: Diagnosis not present

## 2021-07-20 DIAGNOSIS — Z9884 Bariatric surgery status: Secondary | ICD-10-CM | POA: Diagnosis not present

## 2021-07-20 DIAGNOSIS — N1831 Chronic kidney disease, stage 3a: Secondary | ICD-10-CM | POA: Diagnosis not present

## 2021-07-20 DIAGNOSIS — E113593 Type 2 diabetes mellitus with proliferative diabetic retinopathy without macular edema, bilateral: Secondary | ICD-10-CM | POA: Diagnosis not present

## 2021-07-21 DIAGNOSIS — Z20822 Contact with and (suspected) exposure to covid-19: Secondary | ICD-10-CM | POA: Diagnosis not present

## 2021-08-05 ENCOUNTER — Telehealth: Payer: Self-pay

## 2021-08-05 ENCOUNTER — Telehealth: Payer: Self-pay | Admitting: Cardiology

## 2021-08-05 NOTE — Telephone Encounter (Signed)
? ?  Pre-operative Risk Assessment  ?  ?Patient Name: Karen Gardner  ?DOB: 1955-05-20 ?MRN: [id number]  ? ?  ? ?Request for Surgical Clearance   ? ?Procedure:  L Vitrectomy, Membrane peel and cataract removal  ? ?Date of Surgery:  Clearance 08/24/21                              ?   ?Surgeon:  Dr. Richrd Humbles, Dr. Talbert Forest  ?Surgeon's Group or Practice Name:  [facility]  ?Phone number:  [phone] ?Fax number:  [phone] ?  ?Type of Clearance Requested:   ?- Medical  ?- Pharmacy:  Hold Aspirin 7 days prior  ?  ?Type of Anesthesia:  MAC ?  ?Additional requests/questions:   ? ?Signed, ?Johnna Acosta   ?08/05/2021, 11:57 AM   ?

## 2021-08-05 NOTE — Telephone Encounter (Signed)
? ? ?  Name: Karen Gardner  ?DOB: 1955/09/15  ?MRN: [id number] ? ?Primary Cardiologist: Glenetta Hew, MD ? ?Usually cataract surgeries are very low risk and require no specific clearance; however, patient is also going to have a vitrectomy and this will done under MAC. It also looks like that at her last visit with Dr. Ellyn Hack in 04/2021 she continued to complain of chest pain that was felt to be atypical but Imdur was increased. Therefore, I think she needs a virtual visit. ?Preoperative team, please contact this patient and set up a phone call appointment for further preoperative risk assessment. Please obtain consent and complete medication review. Thank you for your help. ? ?I confirm that guidance regarding antiplatelet and oral anticoagulation therapy has been completed and, if necessary, noted below. ? ?Per Dr. Allison Quarry last note, "okay to hold Aspirin for 5 days pre-op for surgeries or procedures." ? ?Darreld Mclean, PA-C ?08/05/2021, 1:32 PM ? ? ?

## 2021-08-05 NOTE — Telephone Encounter (Signed)
?  Patient Consent for Virtual Visit  ? ? ?    ? ?Karen Gardner has provided verbal consent on 08/05/2021 for a virtual visit (video or telephone). ? ? ?CONSENT FOR VIRTUAL VISIT FOR:  Karen Gardner  ?By participating in this virtual visit I agree to the following: ? ?I hereby voluntarily request, consent and authorize [facility] and its employed or contracted physicians, physician assistants, nurse practitioners or other licensed health care professionals (the Practitioner), to provide me with telemedicine health care services (the ?Services") as deemed necessary by the treating Practitioner. I acknowledge and consent to receive the Services by the Practitioner via telemedicine. I understand that the telemedicine visit will involve communicating with the Practitioner through live audiovisual communication technology and the disclosure of certain medical information by electronic transmission. I acknowledge that I have been given the opportunity to request an in-person assessment or other available alternative prior to the telemedicine visit and am voluntarily participating in the telemedicine visit. ? ?I understand that I have the right to withhold or withdraw my consent to the use of telemedicine in the course of my care at any time, without affecting my right to future care or treatment, and that the Practitioner or I may terminate the telemedicine visit at any time. I understand that I have the right to inspect all information obtained and/or recorded in the course of the telemedicine visit and may receive copies of available information for a reasonable fee.  I understand that some of the potential risks of receiving the Services via telemedicine include:  ?Delay or interruption in medical evaluation due to technological equipment failure or disruption; ?Information transmitted may not be sufficient (e.g. poor resolution of images) to allow for appropriate medical decision making by the Practitioner;  and/or  ?In rare instances, security protocols could fail, causing a breach of personal health information. ? ?Furthermore, I acknowledge that it is my responsibility to provide information about my medical history, conditions and care that is complete and accurate to the best of my ability. I acknowledge that Practitioner's advice, recommendations, and/or decision may be based on factors not within their control, such as incomplete or inaccurate data provided by me or distortions of diagnostic images or specimens that may result from electronic transmissions. I understand that the practice of medicine is not an exact science and that Practitioner makes no warranties or guarantees regarding treatment outcomes. I acknowledge that a copy of this consent can be made available to me via my patient portal [facility] MyChart), or I can request a printed copy by calling the office of [facility].   ? ?I understand that my insurance will be billed for this visit.  ? ?I have read or had this consent read to me. ?I understand the contents of this consent, which adequately explains the benefits and risks of the Services being provided via telemedicine.  ?I have been provided ample opportunity to ask questions regarding this consent and the Services and have had my questions answered to my satisfaction. ?I give my informed consent for the services to be provided through the use of telemedicine in my medical care ? ? ?

## 2021-08-05 NOTE — Telephone Encounter (Signed)
Telehealth appointment scheduled. Patient agreeable and voiced understanding.  ?

## 2021-08-08 ENCOUNTER — Ambulatory Visit (INDEPENDENT_AMBULATORY_CARE_PROVIDER_SITE_OTHER): Payer: Medicare Other | Admitting: Physician Assistant

## 2021-08-08 DIAGNOSIS — Z01810 Encounter for preprocedural cardiovascular examination: Secondary | ICD-10-CM | POA: Diagnosis not present

## 2021-08-08 NOTE — Progress Notes (Signed)
? ?Virtual Visit via Telephone Note  ? ?This visit type was conducted due to national recommendations for restrictions regarding the COVID-19 Pandemic (e.g. social distancing) in an effort to limit this patient's exposure and mitigate transmission in our community.  Due to her co-morbid illnesses, this patient is at least at moderate risk for complications without adequate follow up.  This format is felt to be most appropriate for this patient at this time.  The patient did not have access to video technology/had technical difficulties with video requiring transitioning to audio format only (telephone).  All issues noted in this document were discussed and addressed.  No physical exam could be performed with this format.  Please refer to the patient's chart for her  consent to telehealth for [facility]. ? ?Evaluation Performed:  Preoperative cardiovascular risk assessment ?_____________  ? ?Date:  08/08/2021  ? ?Patient ID:  Karen Gardner, Karen Gardner Mar 12, 1956, MRN [id number] ?Patient Location:  ?Home ?Provider location:   ?Office ? ?Primary Care Provider:  Leeroy Cha, MD ?Primary Cardiologist:  Glenetta Hew, MD ? ?Chief Complaint  ?  ?66 y.o. y/o female with a h/o CAD with chronic stable angina, HTN, DM, CKD, HLD and hx of vasovagal syncope who is pending  L Vitrectomy, Membrane peel and cataract removal, and presents today for telephonic preoperative cardiovascular risk assessment. ? ?Cath 02/02/21 ?SUMMARY ?Moderate-severe diffusely calcified coronary arteries with severe single-vessel disease involving a small caliber codominant RCA with multiple segments of 70 to 90% stenoses-best treated medically. ?Diffuse mild to moderate stenosis throughout the left coronary system-most notably 50% Ramus Intermedius. ?Mildly elevated LVEDP with systemic hypertension. ?Very tortuous Left Subclavian-Innominate Artery system ->  ?FOR FUTURE CARDIAC CATHETERIZATIONS, WOULD RECOMMEND FEMORAL ACCESS DUE TO VERY  DIFFICULT MANIPULATION OF CATHETERS, AND ARTERIAL SPASM. ? ?  ?RECOMMENDATIONS ?Aggressive risk factor modification with lipid, glycemic and blood pressure management ?Daily aspirin ?Add carvedilol 3.125 mg daily with plans to titrate up further in outpatient setting. ?Hold metformin 48 hours post cath ? ?Past Medical History  ?  ?Past Medical History:  ?Diagnosis Date  ? Allergic rhinitis   ? Anxiety   ? Arthritis   ? Asthma   ? childhood  ? Chronic low back pain   ? CKD (chronic kidney disease), stage III ([place])   ? However, most recent creatinine 1.5.  ? Coronary artery disease involving native heart with other form of angina pectoris, unspecified vessel or lesion type ([place]) 11/16/2020  ? 11/16/2020: CORONARY CA++ SCORE: Agatston Score 2245.  LAD 1093, LCx 959, RCA 193 (99th percentile) -> COR CTA (Agatston 2437) -moderate to severe multivessel CAD with significant blooming in all 3 epicardial vessels.  CAD RADS 3-4. FFRCT suggests mid RCA occlusion, => CARDIAC CATH 02/02/21: RCA: prox 70%, Mid-distal 90% & distal 90% (small caliber, Not viable PCI target; Prox RI 50%.  ? Depression   ? With anxiety  ? GERD (gastroesophageal reflux disease)   ? Headache   ? sinus headaches   ? Hypercholesterolemia   ? Hypertension   ? Iron deficiency anemia 05/26/2015  ? Has required transfusions-followed by Dr. Marin Olp  ? Iron malabsorption 05/26/2015  ? Menopause   ? Numbness in both hands   ? mostly at night  ? Obesity   ? Osteoarthritis of both knees   ? Status post right TKA (and ankle) with plans for left TKA  ? Type 2 diabetes mellitus without complication, with long-term current use of insulin ([place])   ? Type 2 on insulin pump  ?  Vasovagal syncope 2018  ? Negative work-up  ? Vitamin D deficiency   ? ?Past Surgical History:  ?Procedure Laterality Date  ? COLONOSCOPY    ? EYE SURGERY Bilateral   ? [place]   ? HERNIA REPAIR    ? umb hernia as child  ? KNEE ARTHROSCOPY  02/12/2012  ? Procedure: ARTHROSCOPY KNEE;  Surgeon: Kerin Salen, MD;  Location: [place];  Service: Orthopedics;  Laterality: Right;  Partial Lateral Meniscectomy, Debridement chondromalacia  ? LAPAROSCOPIC GASTRIC SLEEVE RESECTION N/A 03/14/2016  ? Procedure: LAPAROSCOPIC GASTRIC SLEEVE RESECTION, WITH REPAIR OF HIATAL HERNIA REPAI, AND UPPER ENDO;  Surgeon: Johnathan Hausen, MD;  Location: [facility];  Service: General;  Laterality: N/A;  ? LEFT HEART CATH AND CORONARY ANGIOGRAPHY N/A 02/02/2021  ? Procedure: LEFT HEART CATH AND CORONARY ANGIOGRAPHY;  Surgeon: Leonie Man, MD;  Location: [facility] INVASIVE CV LAB;; Heavily calcified tortuous codominant RCA with proximal 70% stenosis.  Mid to distal RCA 90% stenosis.  Distal RCA 90%.  Not a PCI target.  50% RI, proximal to mid LAD 20%.  Mid to distal LCx 30% with 30% in LP AV-heavily calcified.  EF 55-65%.  Mildly elevated LVEDP.  ? ORIF ANKLE FRACTURE Right 02/11/2014  ? Procedure: OPEN REDUCTION INTERNAL FIXATION (ORIF) RIGHT ANKLE FRACTURE;  Surgeon: Kerin Salen, MD;  Location: [place];  Service: Orthopedics;  Laterality: Right;  ? TOTAL KNEE ARTHROPLASTY Right 08/05/2014  ? Procedure: TOTAL KNEE ARTHROPLASTY;  Surgeon: Frederik Pear, MD;  Location: [place];  Service: Orthopedics;  Laterality: Right;  ? TRANSTHORACIC ECHOCARDIOGRAM  05/2016  ? EF 60 to 65%.  Severe basal septal LVH with mild concentric LVH.  No R WMA.  GR 1 DD.  Indeterminate filling pressures. Minimal valve disease.  ? TRANSTHORACIC ECHOCARDIOGRAM  03/29/2021  ? EF 55 to 60%.  No R WMA-normal strain pattern.  GR 1 DD.  Normal RV with RV P mild aortic sclerosis but no stenosis no AI.  Mildly elevated RAP.  ? TRIGGER FINGER RELEASE Right 10/14/2018  ? Procedure: RELEASE TRIGGER FINGER/A-1 PULLEY;  Surgeon: Leanora Cover, MD;  Location: [place];  Service: Orthopedics;  Laterality: Right;  ? UPPER GI ENDOSCOPY    ? ? ?Allergies ? ?Allergies  ?Allergen Reactions  ? Lactose Intolerance (Gi) Diarrhea and Other (See Comments)  ?  *Gas*    ? Oatmeal Other (See Comments)  ?  "Gas" ?  ? ?History of Present Illness  ?  ?LESILE NIEDERER is a 66 y.o. female who presents via audio/video conferencing for a telehealth visit today.  Pt was last seen in cardiology clinic on 04/26/21 by Dr. Ellyn Hack.  At that time JAHMIAH PEEK was continued to complain of chest pain that was felt to be atypical but Imdur was increased.  The patient is now pending procedure as outlined above. Since her last visit, she well. Has chest pain has improved. Only occurring with extreme activity. She knows her limitations. ?The patient denies nausea, vomiting, fever, palpitations, shortness of breath, orthopnea, PND, dizziness, syncope, cough, congestion, abdominal pain, hematochezia, melena, lower extremity edema.  ? ?Home Medications  ?  ?Prior to Admission medications   ?Medication Sig Start Date End Date Taking? Authorizing Provider  ?aspirin EC 81 MG tablet Take 1 tablet (81 mg total) by mouth daily. 30 minutes prior to taking Isosorbide 03/03/21   Leonie Man, MD  ?atorvastatin (LIPITOR) 40 MG tablet Take 40 mg by mouth at bedtime.  [provider]  ?CALCIUM PO Take 1 capsule by mouth 3 (three) times daily.     [provider]  ?carvedilol (COREG) 6.25 MG tablet Take 3.125 mg  ( 1/2 tablet of 6.25 mg) in the morning and 6.25 mg ( 1 tablet) in the evening ?Patient taking differently: Take 6.25 mg by mouth 2 (two) times daily with a meal. 04/26/21   Leonie Man, MD  ?Continuous Blood Gluc Sensor (FREESTYLE LIBRE 14 DAY SENSOR) MISC Apply topically every 14 (fourteen) days. 04/03/21   [provider]  ?DEBROX 6.5 % OTIC solution 5 drops 2 (two) times daily. 03/09/21   [provider]  ?diphenhydrAMINE (BENADRYL) 25 MG tablet Take 25 mg by mouth every 6 (six) hours as needed for allergies.    [provider]  ?Dulaglutide (TRULICITY) A999333 0000000 SOPN Inject 0.75 mg into the skin once a week. Managed by PCP 12/31/20 12/31/21   Leonie Man, MD  ?escitalopram (LEXAPRO) 10 MG tablet Take 10 mg by mouth at bedtime.     [provider]  ?fluticasone (FLONASE) 50 MCG/ACT nasal spray SPRAY SPRAY 1 SPRAY INTO EACH NOSTRIL EVERY

## 2021-08-11 ENCOUNTER — Telehealth: Payer: Self-pay | Admitting: Cardiology

## 2021-08-11 NOTE — Telephone Encounter (Signed)
Jasmine calling to f/u on Medical Clearance that was done on 08/05/21. Please advise ?

## 2021-08-11 NOTE — Telephone Encounter (Signed)
Aug 08, 2021 office visit notes faxed to [facility] at [facility] [phone]) for surgical clearance. ?

## 2021-08-16 DIAGNOSIS — H4312 Vitreous hemorrhage, left eye: Secondary | ICD-10-CM | POA: Diagnosis not present

## 2021-08-16 DIAGNOSIS — H35372 Puckering of macula, left eye: Secondary | ICD-10-CM | POA: Diagnosis not present

## 2021-08-16 DIAGNOSIS — H18413 Arcus senilis, bilateral: Secondary | ICD-10-CM | POA: Diagnosis not present

## 2021-08-16 DIAGNOSIS — H2512 Age-related nuclear cataract, left eye: Secondary | ICD-10-CM | POA: Diagnosis not present

## 2021-08-16 DIAGNOSIS — H43821 Vitreomacular adhesion, right eye: Secondary | ICD-10-CM | POA: Diagnosis not present

## 2021-08-16 DIAGNOSIS — H25013 Cortical age-related cataract, bilateral: Secondary | ICD-10-CM | POA: Diagnosis not present

## 2021-08-16 DIAGNOSIS — H25043 Posterior subcapsular polar age-related cataract, bilateral: Secondary | ICD-10-CM | POA: Diagnosis not present

## 2021-08-16 DIAGNOSIS — E113593 Type 2 diabetes mellitus with proliferative diabetic retinopathy without macular edema, bilateral: Secondary | ICD-10-CM | POA: Diagnosis not present

## 2021-08-16 DIAGNOSIS — H2513 Age-related nuclear cataract, bilateral: Secondary | ICD-10-CM | POA: Diagnosis not present

## 2021-08-24 DIAGNOSIS — E113592 Type 2 diabetes mellitus with proliferative diabetic retinopathy without macular edema, left eye: Secondary | ICD-10-CM | POA: Diagnosis not present

## 2021-08-24 DIAGNOSIS — H2512 Age-related nuclear cataract, left eye: Secondary | ICD-10-CM | POA: Diagnosis not present

## 2021-08-24 DIAGNOSIS — H4312 Vitreous hemorrhage, left eye: Secondary | ICD-10-CM | POA: Diagnosis not present

## 2021-08-24 DIAGNOSIS — H35372 Puckering of macula, left eye: Secondary | ICD-10-CM | POA: Diagnosis not present

## 2021-09-01 DIAGNOSIS — H43811 Vitreous degeneration, right eye: Secondary | ICD-10-CM | POA: Diagnosis not present

## 2021-09-01 DIAGNOSIS — E113593 Type 2 diabetes mellitus with proliferative diabetic retinopathy without macular edema, bilateral: Secondary | ICD-10-CM | POA: Diagnosis not present

## 2021-09-01 DIAGNOSIS — H43821 Vitreomacular adhesion, right eye: Secondary | ICD-10-CM | POA: Diagnosis not present

## 2021-09-05 ENCOUNTER — Other Ambulatory Visit: Payer: Self-pay | Admitting: Cardiology

## 2021-09-06 ENCOUNTER — Ambulatory Visit: Payer: Medicare Other | Admitting: Cardiology

## 2021-09-06 NOTE — Progress Notes (Signed)
Cardiology Office Note:    Date:  09/08/2021   ID:  Tacey Ruiz, DOB 03/14/1956, MRN [id number]  PCP:  Leeroy Cha, [place] Cardiologist: Glenetta Hew, MD   Reason for visit: 36-month follow-up  History of Present Illness:    Karen Gardner is a 66 y.o. female with a hx of hyperlipidemia, hypertension, CAD, chronic stable angina and diabetes.  She last saw Dr. Ellyn Hack in January 2023.  Patient had significant fatigue and persistent atypical chest pain and shortness of breath.  Dr. Ellyn Hack thought she may have some microvascular ischemia and spasm. Coreg was reduced to 3.25 mg for a.m. dose.  Imdur increased to 60 mg.  He thought from her fatigue and dyspnea exertion could be due to deconditioning and possible depression.  Recommend increase exercise to 10 to 15 minutes at a time.  Today, patient states she is doing well.  She denies any chest pain since last visit.  She is try to get back into a walking routine with her dog.  She states before she felt syncopal when she was dyspneic with long walks therefore she is a little hesitant.  She denies shortness of breath with her ADLs.  She may get short of breath after a long shopping trip.  She also complains of fatigue starting around lunchtime.  When we reviewed her medications, we realize she did not decrease her morning Coreg dose to 3.25 mg.  She plans to make this change now.  Otherwise she denies lightheadedness, palpitations and lower extremity edema.    Past Medical History:  Diagnosis Date   Allergic rhinitis    Anxiety    Arthritis    Asthma    childhood   Chronic low back pain    CKD (chronic kidney disease), stage III ([place])    However, most recent creatinine 1.5.   Coronary artery disease involving native heart with other form of angina pectoris, unspecified vessel or lesion type [facility]) 11/16/2020   11/16/2020: CORONARY CA++ SCORE: Agatston Score 2245.  LAD 1093, LCx 959, RCA 193 (99th  percentile) -> COR CTA (Agatston 2437) -moderate to severe multivessel CAD with significant blooming in all 3 epicardial vessels.  CAD RADS 3-4. FFRCT suggests mid RCA occlusion, => CARDIAC CATH 02/02/21: RCA: prox 70%, Mid-distal 90% & distal 90% (small caliber, Not viable PCI target; Prox RI 50%.   Depression    With anxiety   GERD (gastroesophageal reflux disease)    Headache    sinus headaches    Hypercholesterolemia    Hypertension    Iron deficiency anemia 05/26/2015   Has required transfusions-followed by Dr. Marin Olp   Iron malabsorption 05/26/2015   Menopause    Numbness in both hands    mostly at night   Obesity    Osteoarthritis of both knees    Status post right TKA (and ankle) with plans for left TKA   Type 2 diabetes mellitus without complication, with long-term current use of insulin (HCC)    Type 2 on insulin pump   Vasovagal syncope 2018   Negative work-up   Vitamin D deficiency     Past Surgical History:  Procedure Laterality Date   COLONOSCOPY     EYE SURGERY Bilateral    [place]    HERNIA REPAIR     umb hernia as child   KNEE ARTHROSCOPY  02/12/2012   Procedure: ARTHROSCOPY KNEE;  Surgeon: Kerin Salen, MD;  Location: [facility];  Service: Orthopedics;  Laterality:  Right;  Partial Lateral Meniscectomy, Debridement chondromalacia   LAPAROSCOPIC GASTRIC SLEEVE RESECTION N/A 03/14/2016   Procedure: LAPAROSCOPIC GASTRIC SLEEVE RESECTION, WITH REPAIR OF HIATAL HERNIA REPAI, AND UPPER ENDO;  Surgeon: Johnathan Hausen, MD;  Location: [facility];  Service: General;  Laterality: N/A;   LEFT HEART CATH AND CORONARY ANGIOGRAPHY N/A 02/02/2021   Procedure: LEFT HEART CATH AND CORONARY ANGIOGRAPHY;  Surgeon: Leonie Man, MD;  Location: [facility];; Heavily calcified tortuous codominant RCA with proximal 70% stenosis.  Mid to distal RCA 90% stenosis.  Distal RCA 90%.  Not a PCI target.  50% RI, proximal to mid LAD 20%.  Mid to distal LCx 30% with 30% in LP  AV-heavily calcified.  EF 55-65%.  Mildly elevated LVEDP.   ORIF ANKLE FRACTURE Right 02/11/2014   Procedure: OPEN REDUCTION INTERNAL FIXATION (ORIF) RIGHT ANKLE FRACTURE;  Surgeon: Kerin Salen, MD;  Location: [facility];  Service: Orthopedics;  Laterality: Right;   TOTAL KNEE ARTHROPLASTY Right 08/05/2014   Procedure: TOTAL KNEE ARTHROPLASTY;  Surgeon: Frederik Pear, MD;  Location: [facility];  Service: Orthopedics;  Laterality: Right;   TRANSTHORACIC ECHOCARDIOGRAM  05/2016   EF 60 to 65%.  Severe basal septal LVH with mild concentric LVH.  No R WMA.  GR 1 DD.  Indeterminate filling pressures. Minimal valve disease.   TRANSTHORACIC ECHOCARDIOGRAM  03/29/2021   EF 55 to 60%.  No R WMA-normal strain pattern.  GR 1 DD.  Normal RV with RV P mild aortic sclerosis but no stenosis no AI.  Mildly elevated RAP.   TRIGGER FINGER RELEASE Right 10/14/2018   Procedure: RELEASE TRIGGER FINGER/A-1 PULLEY;  Surgeon: Leanora Cover, MD;  Location: [facility];  Service: Orthopedics;  Laterality: Right;   UPPER GI ENDOSCOPY      Current Medications: Current Meds  Medication Sig   aspirin EC 81 MG tablet Take 1 tablet (81 mg total) by mouth daily. 30 minutes prior to taking Isosorbide   atorvastatin (LIPITOR) 40 MG tablet Take 40 mg by mouth at bedtime.    atorvastatin (LIPITOR) 80 MG tablet Take 1 tablet by mouth at bedtime.   CALCIUM PO Take 1 capsule by mouth 3 (three) times daily.    carvedilol (COREG) 6.25 MG tablet Take 3.125 mg  ( 1/2 tablet of 6.25 mg) in the morning and 6.25 mg ( 1 tablet) in the evening (Patient taking differently: Take 6.25 mg by mouth 2 (two) times daily with a meal.)   Continuous Blood Gluc Sensor (FREESTYLE LIBRE 14 DAY SENSOR) MISC Apply topically every 14 (fourteen) days.   DEBROX 6.5 % OTIC solution 5 drops 2 (two) times daily.   diphenhydrAMINE (BENADRYL) 25 MG tablet Take 25 mg by mouth every 6 (six) hours as needed for allergies.   Dulaglutide (TRULICITY) [id number] [id number]  SOPN Inject 0.75 mg into the skin once a week. Managed by PCP   escitalopram (LEXAPRO) 10 MG tablet Take 10 mg by mouth at bedtime.    fluticasone (FLONASE) 50 MCG/ACT nasal spray SPRAY SPRAY 1 SPRAY INTO EACH NOSTRIL EVERY DAY FOR 30 DAYS   glucose blood (ONETOUCH VERIO) test strip Use as instructed to check blood sugar once a day dx code E11.65   Homeopathic Products (LEG CRAMPS) TABS Take 1 tablet by mouth at bedtime.   insulin lispro (HUMALOG) 100 UNIT/ML KwikPen Inject 8 Units into the skin 3 (three) times daily.   lansoprazole (PREVACID) 15 MG capsule Take 15 mg by mouth at bedtime.  losartan (COZAAR) 100 MG tablet Take 100 mg by mouth at bedtime.   metFORMIN (GLUCOPHAGE) 500 MG tablet Take by mouth at bedtime.   Multiple Vitamin (MULTIVITAMIN WITH MINERALS) TABS tablet Take 1 tablet by mouth 2 (two) times daily.   nitroGLYCERIN (NITROSTAT) 0.4 MG SL tablet Place 1 tablet (0.4 mg total) under the tongue every 5 (five) minutes as needed for chest pain.   ranolazine (RANEXA) 500 MG 12 hr tablet TAKE 1 TABLET BY MOUTH EVERY MORNING AND AT BEDTIME   Ranolazine ER 500 MG PACK Take 500 mg by mouth in the morning and at bedtime.     Allergies:   Lactose intolerance (gi) and Oatmeal   Social History   Socioeconomic History   Marital status: Single    Spouse name: Not on file   Number of children: Not on file   Years of education: Not on file   Highest education level: Bachelor's degree (e.g., BA, AB, BS)  Occupational History   Occupation: -Primary school teacher: [employer]    Comment: [employer]  Tobacco Use   Smoking status: Former    Packs/day: 0.25    Years: 1.00    Total pack years: 0.25    Types: Cigarettes    Quit date: 02/08/1972    Years since quitting: 49.6   Smokeless tobacco: Never  Substance and Sexual Activity   Alcohol use: No    Alcohol/week: 0.0 standard drinks of alcohol   Drug use: No   Sexual activity: Not Currently  Other  Topics Concern   Not on file  Social History Narrative   She is single, 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  Previously worked as a Theme park manager.   She is single, lives alone.  Used to exercise by walking frequently, but with her knee arthritis, not walking as much.   Social Determinants of Health   Financial Resource Strain: Not on file  Food Insecurity: Not on file  Transportation Needs: Not on file  Physical Activity: Not on file  Stress: Not on file  Social Connections: Not on file     Family History: The patient's family history includes Alcohol abuse in her father; Heart attack (age of onset: 42) in her sister.  ROS:   Please see the history of present illness.     EKGs/Labs/Other Studies Reviewed:    EKG:  The ekg ordered today demonstrates normal sinus rhythm, nonspecific T wave abnormality, heart rate 78.  Recent Labs: 01/27/2021: BUN 23; Creatinine, Ser 1.12; Hemoglobin 12.3; Platelets 200; Potassium 4.2; Sodium 135   Recent Lipid Panel No results found for: "CHOL", "TRIG", "HDL", "LDLCALC", "LDLDIRECT"  Physical Exam:    VS:  BP 120/68 (BP Location: Left Arm)   Pulse 78   Ht 5' 5.5" (1.664 m)   Wt 190 lb 9.6 oz (86.5 kg)   SpO2 97%   BMI 31.24 kg/m    No data found.  Wt Readings from Last 3 Encounters:  09/08/21 190 lb 9.6 oz (86.5 kg)  04/26/21 201 lb 3.2 oz (91.3 kg)  03/03/21 205 lb (93 kg)     GEN: Well nourished, well developed in no acute distress HEENT: Normal NECK: No JVD; No carotid bruits CARDIAC: RRR, no murmurs, rubs, gallops RESPIRATORY:  Clear to auscultation without rales, wheezing or rhonchi  ABDOMEN: Soft, non-tender, non-distended MUSCULOSKELETAL: No edema; No deformity  SKIN: Warm and dry NEUROLOGIC:  Alert and oriented PSYCHIATRIC:  Normal affect  ASSESSMENT AND PLAN   Coronary artery disease with stable angina -LHC 02/2021: Moderate-severe diffusely calcified coronary arteries with severe single-vessel  disease involving a small caliber codominant RCA with multiple segments of 70 to 90% stenoses-best treated medically.  Diffuse mild to moderate stenosis throughout the left coronary system-most notably 50% Ramus Intermedius. -Echo 03/2021: Normal EF.  No significant valve disease. -Continue Imdur and Ranexa -Continue beta-blocker therapy: Coreg 3.125 mg in a.m. (dose reduced secondary to daytime fatigue) and 6.25 mg in p.m. -Continue statin therapy  Hypertension, well controlled -Continue current medications. -Goal BP is <130/80.  Recommend DASH diet (high in vegetables, fruits, low-fat dairy products, whole grains, poultry, fish, and nuts and low in sweets, sugar-sweetened beverages, and red meats), salt restriction and increase physical activity.  Hyperlipidemia with goal LDL less than 70 -LDL 57 in January 2023. -Continue atorvastatin. -Discussed cholesterol lowering diets - Mediterranean diet, DASH diet, vegetarian diet, low-carbohydrate diet and avoidance of trans fats.  Discussed healthier choice substitutes.  Nuts, high-fiber foods, and fiber supplements may also improve lipids.    Obesity -She is working on weight loss and has lost 15 pounds since December.  Congratulated her today.  She states her hemoglobin A1c was checked 2 weeks ago was 7.1% which is also an improvement. -Discussed how even a 5-10% weight loss can have cardiovascular benefits.   -Recommend moderate intensity activity for 30 minutes 5 days/week and the DASH diet.  Disposition - Follow-up in 6 months with Dr. Ellyn Hack or myself.    Medication Adjustments/Labs and Tests Ordered: Current medicines are reviewed at length with the patient today.  Concerns regarding medicines are outlined above.  Orders Placed This Encounter  Procedures   EKG 12-Lead   No orders of the defined types were placed in this encounter.   Patient Instructions  Medication Instructions:  No Changes *If you need a refill on your cardiac  medications before your next appointment, please call your pharmacy*   Lab Work: No Labs If you have labs (blood work) drawn today and your tests are completely normal, you will receive your results only by: [place] (if you have MyChart) OR A paper copy in the mail If you have any lab test that is abnormal or we need to change your treatment, we will call you to review the results.   Testing/Procedures: No Testing   Follow-Up: At [facility], you and your health needs are our priority.  As part of our continuing mission to provide you with exceptional heart care, we have created designated Provider Care Teams.  These Care Teams include your primary Cardiologist (physician) and Advanced Practice Providers (APPs -  Physician Assistants and Nurse Practitioners) who all work together to provide you with the care you need, when you need it.  We recommend signing up for the patient portal called "MyChart".  Sign up information is provided on this After Visit Summary.  MyChart is used to connect with patients for Virtual Visits (Telemedicine).  Patients are able to view lab/test results, encounter notes, upcoming appointments, etc.  Non-urgent messages can be sent to your provider as well.   To learn more about what you can do with MyChart, go to [email or web address].    Your next appointment:   6 month(s)  The format for your next appointment:   In Person  Provider:   Glenetta Hew, MD or Caron Presume, PA-C     Important Information About Sugar       5 surprising reasons you're so  fatigued Why are you so tired even after a full night's sleep? "I think people are surprised how often there isn't one single answer to what is causing their fatigue," says Thornton Dales, D.O., a family medicine physician at [facility]. "Fatigue is tough to prove with a lab test. We can test for thyroid problems or abnormal hormone levels, but the majority of people with fatigue don't  have an underlying health issue. This is why it's crucial to make positive lifestyle changes if you want to have more energy." Here are five common reasons for fatigue that aren't related to an underlying health condition.  Cause #1: Lack of a daily routine "One of the biggest things I recommend to people with fatigue is establishing a good routine," says Dr. Lynda Rainwater. "It's extremely difficult to address fatigue in people with abnormal schedules." Routines are especially crucial during the COVID-19 response. A consistent routine reduces the decisions you have to make each day and decreases stress. Try: Going to bed and waking up at the same time every day. Working out at the same time each day. Establishing certain times of the day or week to do chores, run errands, prep meals or enjoy downtime.  Cause #2: Your diet Perhaps you're now eating more processed foods and sugar to cope with stress and worry. If your diet has changed or if you consume many processed foods, you may notice you have more fatigue. An unhealthy diet can cause blood sugar instability and inflammation.  "Before seeing a doctor for fatigue, take a look at your diet and try to eat in a healthier, more balanced way," he says.  Cause #3: Lack of exercise "Our bodies were made to exert a certain amount of energy," says Dr. Lynda Rainwater. "When you don't get enough physical activity, your body won't function properly and perform as well as it should." Exercise can make a difference in your energy levels because it balances your hormones and helps set your circadian rhythm, promoting better sleep.  Cause #4: Unmanaged stress "I think people underestimate how much their mood affects their energy levels," he says. "Managing stress is crucial to helping your body function at its highest level." Exercise is a cornerstone of stress management because it increases the body's feel-good hormones, including endorphins and serotonin. "Exercise  can make as big an impact on stress as any medication that increases serotonin - with none of the side effects," he explains. "If you can manage stress through exercise, that is absolutely the best way to start."  Cause #5: Major life changes Major life changes - suddenly working from home during the COVID-19 pandemic, starting a new job, having a baby or moving - can alter your circadian rhythm, increase your stress and throw off your routine, all of which contributes to fatigue. "I see fatigue in people of all ages, but young adults tend to notice it sooner because they expect to be healthy and have energy," he says. "Older adults may assume fatigue is part of getting older, so they don't see a physician about it." If you're in the midst of big life events, focus on stress management, good nutrition, exercise and establishing a good routine.    Signed, Warren Lacy, PA-C  09/08/2021 8:49 AM    [facility]

## 2021-09-08 ENCOUNTER — Encounter: Payer: Self-pay | Admitting: Physician Assistant

## 2021-09-08 ENCOUNTER — Ambulatory Visit (INDEPENDENT_AMBULATORY_CARE_PROVIDER_SITE_OTHER): Payer: Medicare Other | Admitting: Physician Assistant

## 2021-09-08 VITALS — BP 120/68 | HR 78 | Ht 65.5 in | Wt 190.6 lb

## 2021-09-08 DIAGNOSIS — I208 Other forms of angina pectoris: Secondary | ICD-10-CM | POA: Diagnosis not present

## 2021-09-08 DIAGNOSIS — E1169 Type 2 diabetes mellitus with other specified complication: Secondary | ICD-10-CM | POA: Diagnosis not present

## 2021-09-08 DIAGNOSIS — E785 Hyperlipidemia, unspecified: Secondary | ICD-10-CM | POA: Diagnosis not present

## 2021-09-08 DIAGNOSIS — I25119 Atherosclerotic heart disease of native coronary artery with unspecified angina pectoris: Secondary | ICD-10-CM | POA: Diagnosis not present

## 2021-09-08 DIAGNOSIS — I10 Essential (primary) hypertension: Secondary | ICD-10-CM | POA: Diagnosis not present

## 2021-09-08 NOTE — Patient Instructions (Addendum)
Medication Instructions:  No Changes *If you need a refill on your cardiac medications before your next appointment, please call your pharmacy*   Lab Work: No Labs If you have labs (blood work) drawn today and your tests are completely normal, you will receive your results only by: MyChart Message (if you have MyChart) OR A paper copy in the mail If you have any lab test that is abnormal or we need to change your treatment, we will call you to review the results.   Testing/Procedures: No Testing   Follow-Up: At [facility], you and your health needs are our priority.  As part of our continuing mission to provide you with exceptional heart care, we have created designated Provider Care Teams.  These Care Teams include your primary Cardiologist (physician) and Advanced Practice Providers (APPs -  Physician Assistants and Nurse Practitioners) who all work together to provide you with the care you need, when you need it.  We recommend signing up for the patient portal called "MyChart".  Sign up information is provided on this After Visit Summary.  MyChart is used to connect with patients for Virtual Visits (Telemedicine).  Patients are able to view lab/test results, encounter notes, upcoming appointments, etc.  Non-urgent messages can be sent to your provider as well.   To learn more about what you can do with MyChart, go to [email or web address].    Your next appointment:   6 month(s)  The format for your next appointment:   In Person  Provider:   Bryan Lemma, MD or [name], PA-C     Important Information About Sugar       5 surprising reasons you're so fatigued Why are you so tired even after a full night's sleep? "I think people are surprised how often there isn't one single answer to what is causing their fatigue," says Helen Hashimoto, D.O., a family medicine physician at [place]. "Fatigue is tough to prove with a lab test. We can test for thyroid problems  or abnormal hormone levels, but the majority of people with fatigue don't have an underlying health issue. This is why it's crucial to make positive lifestyle changes if you want to have more energy." Here are five common reasons for fatigue that aren't related to an underlying health condition.  Cause #1: Lack of a daily routine "One of the biggest things I recommend to people with fatigue is establishing a good routine," says Dr. Princess Perna. "It's extremely difficult to address fatigue in people with abnormal schedules." Routines are especially crucial during the COVID-19 response. A consistent routine reduces the decisions you have to make each day and decreases stress. Try: Going to bed and waking up at the same time every day. Working out at the same time each day. Establishing certain times of the day or week to do chores, run errands, prep meals or enjoy downtime.  Cause #2: Your diet Perhaps you're now eating more processed foods and sugar to cope with stress and worry. If your diet has changed or if you consume many processed foods, you may notice you have more fatigue. An unhealthy diet can cause blood sugar instability and inflammation.  "Before seeing a doctor for fatigue, take a look at your diet and try to eat in a healthier, more balanced way," he says.  Cause #3: Lack of exercise "Our bodies were made to exert a certain amount of energy," says Dr. Princess Perna. "When you don't get enough physical activity, your body won't function properly and perform  as well as it should." Exercise can make a difference in your energy levels because it balances your hormones and helps set your circadian rhythm, promoting better sleep.  Cause #4: Unmanaged stress "I think people underestimate how much their mood affects their energy levels," he says. "Managing stress is crucial to helping your body function at its highest level." Exercise is a cornerstone of stress management because it increases the  body's feel-good hormones, including endorphins and serotonin. "Exercise can make as big an impact on stress as any medication that increases serotonin - with none of the side effects," he explains. "If you can manage stress through exercise, that is absolutely the best way to start."  Cause #5: Major life changes Major life changes - suddenly working from home during the COVID-19 pandemic, starting a new job, having a baby or moving - can alter your circadian rhythm, increase your stress and throw off your routine, all of which contributes to fatigue. "I see fatigue in people of all ages, but young adults tend to notice it sooner because they expect to be healthy and have energy," he says. "Older adults may assume fatigue is part of getting older, so they don't see a physician about it." If you're in the midst of big life events, focus on stress management, good nutrition, exercise and establishing a good routine.

## 2021-09-12 ENCOUNTER — Other Ambulatory Visit: Payer: Self-pay | Admitting: Cardiology

## 2021-09-21 DIAGNOSIS — E113592 Type 2 diabetes mellitus with proliferative diabetic retinopathy without macular edema, left eye: Secondary | ICD-10-CM | POA: Diagnosis not present

## 2021-09-21 DIAGNOSIS — H43811 Vitreous degeneration, right eye: Secondary | ICD-10-CM | POA: Diagnosis not present

## 2021-09-29 DIAGNOSIS — F5101 Primary insomnia: Secondary | ICD-10-CM | POA: Diagnosis not present

## 2021-09-29 DIAGNOSIS — N1831 Chronic kidney disease, stage 3a: Secondary | ICD-10-CM | POA: Diagnosis not present

## 2021-09-29 DIAGNOSIS — I700 Atherosclerosis of aorta: Secondary | ICD-10-CM | POA: Diagnosis not present

## 2021-09-29 DIAGNOSIS — I10 Essential (primary) hypertension: Secondary | ICD-10-CM | POA: Diagnosis not present

## 2021-09-29 DIAGNOSIS — I25118 Atherosclerotic heart disease of native coronary artery with other forms of angina pectoris: Secondary | ICD-10-CM | POA: Diagnosis not present

## 2021-09-29 DIAGNOSIS — F3341 Major depressive disorder, recurrent, in partial remission: Secondary | ICD-10-CM | POA: Diagnosis not present

## 2021-09-29 DIAGNOSIS — Z9884 Bariatric surgery status: Secondary | ICD-10-CM | POA: Diagnosis not present

## 2021-09-29 DIAGNOSIS — M1712 Unilateral primary osteoarthritis, left knee: Secondary | ICD-10-CM | POA: Diagnosis not present

## 2021-09-29 DIAGNOSIS — F4321 Adjustment disorder with depressed mood: Secondary | ICD-10-CM | POA: Diagnosis not present

## 2021-09-29 DIAGNOSIS — E113593 Type 2 diabetes mellitus with proliferative diabetic retinopathy without macular edema, bilateral: Secondary | ICD-10-CM | POA: Diagnosis not present

## 2021-09-29 DIAGNOSIS — Z794 Long term (current) use of insulin: Secondary | ICD-10-CM | POA: Diagnosis not present

## 2021-09-29 DIAGNOSIS — Z0000 Encounter for general adult medical examination without abnormal findings: Secondary | ICD-10-CM | POA: Diagnosis not present

## 2021-09-29 DIAGNOSIS — E1165 Type 2 diabetes mellitus with hyperglycemia: Secondary | ICD-10-CM | POA: Diagnosis not present

## 2021-09-29 DIAGNOSIS — E669 Obesity, unspecified: Secondary | ICD-10-CM | POA: Diagnosis not present

## 2021-09-30 DIAGNOSIS — M7062 Trochanteric bursitis, left hip: Secondary | ICD-10-CM | POA: Diagnosis not present

## 2021-10-06 DIAGNOSIS — R262 Difficulty in walking, not elsewhere classified: Secondary | ICD-10-CM | POA: Diagnosis not present

## 2021-10-06 DIAGNOSIS — M25551 Pain in right hip: Secondary | ICD-10-CM | POA: Diagnosis not present

## 2021-10-07 ENCOUNTER — Encounter (HOSPITAL_COMMUNITY): Payer: Self-pay | Admitting: *Deleted

## 2021-10-28 DIAGNOSIS — N1831 Chronic kidney disease, stage 3a: Secondary | ICD-10-CM | POA: Diagnosis not present

## 2021-10-28 DIAGNOSIS — E113593 Type 2 diabetes mellitus with proliferative diabetic retinopathy without macular edema, bilateral: Secondary | ICD-10-CM | POA: Diagnosis not present

## 2021-10-28 DIAGNOSIS — Z9884 Bariatric surgery status: Secondary | ICD-10-CM | POA: Diagnosis not present

## 2021-10-28 DIAGNOSIS — Z794 Long term (current) use of insulin: Secondary | ICD-10-CM | POA: Diagnosis not present

## 2021-10-28 DIAGNOSIS — M8580 Other specified disorders of bone density and structure, unspecified site: Secondary | ICD-10-CM | POA: Diagnosis not present

## 2021-10-28 DIAGNOSIS — E1165 Type 2 diabetes mellitus with hyperglycemia: Secondary | ICD-10-CM | POA: Diagnosis not present

## 2021-10-31 ENCOUNTER — Other Ambulatory Visit: Payer: Self-pay

## 2021-10-31 MED ORDER — RANOLAZINE ER 500 MG PO TB12: ORAL_TABLET | ORAL | 9 refills | Status: DC

## 2021-11-09 DIAGNOSIS — H401132 Primary open-angle glaucoma, bilateral, moderate stage: Secondary | ICD-10-CM | POA: Diagnosis not present

## 2021-11-11 DIAGNOSIS — H04122 Dry eye syndrome of left lacrimal gland: Secondary | ICD-10-CM | POA: Diagnosis not present

## 2021-11-11 DIAGNOSIS — E113592 Type 2 diabetes mellitus with proliferative diabetic retinopathy without macular edema, left eye: Secondary | ICD-10-CM | POA: Diagnosis not present

## 2021-11-16 ENCOUNTER — Other Ambulatory Visit: Payer: Self-pay | Admitting: Cardiology

## 2021-12-12 ENCOUNTER — Other Ambulatory Visit: Payer: Self-pay | Admitting: Internal Medicine

## 2021-12-12 DIAGNOSIS — Z1231 Encounter for screening mammogram for malignant neoplasm of breast: Secondary | ICD-10-CM

## 2021-12-15 ENCOUNTER — Other Ambulatory Visit: Payer: Self-pay | Admitting: Cardiology

## 2021-12-29 DIAGNOSIS — H43811 Vitreous degeneration, right eye: Secondary | ICD-10-CM | POA: Diagnosis not present

## 2021-12-29 DIAGNOSIS — H31013 Macula scars of posterior pole (postinflammatory) (post-traumatic), bilateral: Secondary | ICD-10-CM | POA: Diagnosis not present

## 2021-12-29 DIAGNOSIS — H3582 Retinal ischemia: Secondary | ICD-10-CM | POA: Diagnosis not present

## 2021-12-29 DIAGNOSIS — H43821 Vitreomacular adhesion, right eye: Secondary | ICD-10-CM | POA: Diagnosis not present

## 2021-12-29 DIAGNOSIS — E113593 Type 2 diabetes mellitus with proliferative diabetic retinopathy without macular edema, bilateral: Secondary | ICD-10-CM | POA: Diagnosis not present

## 2022-01-11 ENCOUNTER — Ambulatory Visit
Admission: RE | Admit: 2022-01-11 | Discharge: 2022-01-11 | Disposition: A | Discharge status: Home / Self Care | Payer: Medicare Other | Source: Ambulatory Visit | Attending: Internal Medicine | Admitting: Internal Medicine

## 2022-01-11 DIAGNOSIS — Z1231 Encounter for screening mammogram for malignant neoplasm of breast: Secondary | ICD-10-CM

## 2022-01-12 ENCOUNTER — Other Ambulatory Visit: Payer: Self-pay | Admitting: Internal Medicine

## 2022-01-12 DIAGNOSIS — R928 Other abnormal and inconclusive findings on diagnostic imaging of breast: Secondary | ICD-10-CM

## 2022-01-13 DIAGNOSIS — E1165 Type 2 diabetes mellitus with hyperglycemia: Secondary | ICD-10-CM | POA: Diagnosis not present

## 2022-01-13 DIAGNOSIS — G4700 Insomnia, unspecified: Secondary | ICD-10-CM | POA: Diagnosis not present

## 2022-01-13 DIAGNOSIS — M8580 Other specified disorders of bone density and structure, unspecified site: Secondary | ICD-10-CM | POA: Diagnosis not present

## 2022-01-13 DIAGNOSIS — E11319 Type 2 diabetes mellitus with unspecified diabetic retinopathy without macular edema: Secondary | ICD-10-CM | POA: Diagnosis not present

## 2022-01-13 DIAGNOSIS — Z794 Long term (current) use of insulin: Secondary | ICD-10-CM | POA: Diagnosis not present

## 2022-01-13 DIAGNOSIS — G4709 Other insomnia: Secondary | ICD-10-CM | POA: Diagnosis not present

## 2022-01-13 DIAGNOSIS — N1831 Chronic kidney disease, stage 3a: Secondary | ICD-10-CM | POA: Diagnosis not present

## 2022-01-13 DIAGNOSIS — E113593 Type 2 diabetes mellitus with proliferative diabetic retinopathy without macular edema, bilateral: Secondary | ICD-10-CM | POA: Diagnosis not present

## 2022-01-13 DIAGNOSIS — Z79899 Other long term (current) drug therapy: Secondary | ICD-10-CM | POA: Diagnosis not present

## 2022-01-13 DIAGNOSIS — Z9884 Bariatric surgery status: Secondary | ICD-10-CM | POA: Diagnosis not present

## 2022-01-13 DIAGNOSIS — E669 Obesity, unspecified: Secondary | ICD-10-CM | POA: Diagnosis not present

## 2022-01-14 DIAGNOSIS — Z23 Encounter for immunization: Secondary | ICD-10-CM | POA: Diagnosis not present

## 2022-01-23 ENCOUNTER — Telehealth: Payer: Self-pay | Admitting: Cardiology

## 2022-01-23 DIAGNOSIS — N1831 Chronic kidney disease, stage 3a: Secondary | ICD-10-CM | POA: Diagnosis not present

## 2022-01-23 MED ORDER — NITROGLYCERIN 0.4 MG SL SUBL: 0.4000 mg | SUBLINGUAL_TABLET | SUBLINGUAL | 4 refills | Status: DC | PRN

## 2022-01-23 NOTE — Telephone Encounter (Signed)
*  STAT* If patient is at the pharmacy, call can be transferred to refill team.   1. Which medications need to be refilled? (please list name of each medication and dose if known) nitroGLYCERIN (NITROSTAT) 0.4 MG SL tablet  2. Which pharmacy/location (including street and city if local pharmacy) is medication to be sent to? [facility] [place], [place] AT [address]  3. Do they need a 30 day or 90 day supply? [place]

## 2022-02-06 ENCOUNTER — Ambulatory Visit
Admission: RE | Admit: 2022-02-06 | Discharge: 2022-02-06 | Disposition: A | Discharge status: Home / Self Care | Payer: Medicare Other | Source: Ambulatory Visit | Attending: Internal Medicine | Admitting: Internal Medicine

## 2022-02-06 ENCOUNTER — Other Ambulatory Visit: Payer: Self-pay | Admitting: Internal Medicine

## 2022-02-06 DIAGNOSIS — R921 Mammographic calcification found on diagnostic imaging of breast: Secondary | ICD-10-CM | POA: Diagnosis not present

## 2022-02-06 DIAGNOSIS — R928 Other abnormal and inconclusive findings on diagnostic imaging of breast: Secondary | ICD-10-CM

## 2022-02-16 ENCOUNTER — Telehealth: Payer: Self-pay | Admitting: Cardiology

## 2022-02-16 MED ORDER — CARVEDILOL 6.25 MG PO TABS: 6.2500 mg | ORAL_TABLET | Freq: Two times a day (BID) | ORAL | 0 refills | Status: DC

## 2022-02-16 NOTE — Telephone Encounter (Signed)
 *  STAT* If patient is at the pharmacy, call can be transferred to refill team.   1. Which medications need to be refilled? (please list name of each medication and dose if known) carvedilol (COREG) 6.25 MG tablet   2. Which pharmacy/location (including street and city if local pharmacy) is medication to be sent to? [address],  [address], [place], [place] [address] Phone: [phone]  3. Do they need a 30 day or 90 day supply? 30 days  [place] and she forgot her medication at home. She is requesting emergency refill to this pharmacy. She needs it today

## 2022-03-08 ENCOUNTER — Ambulatory Visit: Payer: Medicare Other | Attending: Cardiology | Admitting: Cardiology

## 2022-03-08 ENCOUNTER — Encounter: Payer: Self-pay | Admitting: Cardiology

## 2022-03-08 VITALS — BP 130/72 | HR 79 | Ht 65.0 in | Wt 186.4 lb

## 2022-03-08 DIAGNOSIS — I10 Essential (primary) hypertension: Secondary | ICD-10-CM | POA: Diagnosis not present

## 2022-03-08 DIAGNOSIS — I2089 Other forms of angina pectoris: Secondary | ICD-10-CM | POA: Diagnosis not present

## 2022-03-08 DIAGNOSIS — E1169 Type 2 diabetes mellitus with other specified complication: Secondary | ICD-10-CM | POA: Diagnosis not present

## 2022-03-08 DIAGNOSIS — E785 Hyperlipidemia, unspecified: Secondary | ICD-10-CM | POA: Insufficient documentation

## 2022-03-08 DIAGNOSIS — T671XXA Heat syncope, initial encounter: Secondary | ICD-10-CM | POA: Diagnosis not present

## 2022-03-08 DIAGNOSIS — I25119 Atherosclerotic heart disease of native coronary artery with unspecified angina pectoris: Secondary | ICD-10-CM | POA: Diagnosis not present

## 2022-03-08 MED ORDER — CARVEDILOL 6.25 MG PO TABS: ORAL_TABLET | ORAL | 3 refills | Status: DC

## 2022-03-08 NOTE — Patient Instructions (Addendum)
Other Instructions   Keep hydrate , hydrate   Talk with your primary doctor or Endocrinologist  about vestibular treatment , if you continue to have issue with dizzy   Medication Instructions:    Change in medication:Carvedilol  Take 3.125 mg ( 1/2 tablet of 6.25 mg) in the morning  and  6.25 mg in the evening    *If you need a refill on your cardiac medications before your next appointment, please call your pharmacy*   Lab Work:  Not needed    Testing/Procedures:  Not needed  Follow-Up: At [facility], you and your health needs are our priority.  As part of our continuing mission to provide you with exceptional heart care, we have created designated Provider Care Teams.  These Care Teams include your primary Cardiologist (physician) and Advanced Practice Providers (APPs -  Physician Assistants and Nurse Practitioners) who all work together to provide you with the care you need, when you need it.  We recommend signing up for the patient portal called "MyChart".  Sign up information is provided on this After Visit Summary.  MyChart is used to connect with patients for Virtual Visits (Telemedicine).  Patients are able to view lab/test results, encounter notes, upcoming appointments, etc.  Non-urgent messages can be sent to your provider as well.   To learn more about what you can do with MyChart, go to [email or web address].    Your next appointment:   6 month(s)  The format for your next appointment:   In Person  Provider:   [name], PA-C    Then, Bryan Lemma, MD will plan to see you again in 12 month(s).   Other Instructions   Keep hydrate , hydrate   Talk with your primary doctor or Endocrinologist  about vestibular treatment , if you continue to have issue with dizzy

## 2022-03-08 NOTE — Progress Notes (Signed)
Primary Care Provider: [name], MD [name], MD Electrophysiologist: None  Clinic Note: Chief Complaint  Patient presents with   Follow-up    6 months.  Doing okay.   Coronary Artery Disease    Only 4 times in the last 6 months chest pain with nitroglycerin.   Loss of Consciousness    1 episode 3 weekends ago.  Was at church, hot, had not eaten    ===================================  ASSESSMENT/PLAN   Problem List Items Addressed This Visit       Cardiology Problems   Coronary artery disease involving native coronary artery of native heart with angina pectoris (HCC) - Primary (Chronic)    Diffuse small vessel disease in the RCA with chronic stable angina.  Minimal disease in the LCA system.  Some of her symptoms are somewhat atypical, she has had 4 times when she is taking nitroglycerin.  Only 4 episodes of mostly in the evening over 6 months is probably a reasonable amount that she is not really having exertional symptoms.   She is on 60 mg of Imdur which I just asked her to take a little earlier in the day. She had some fatigue and dizzy issues.  My plan had been for her to take her morning dose of carvedilol as 1/2 tablet as opposed to a full tablet.  This was not done.  Can make that change now.   Reduce a.m. dose to carvedilol 1/2 tablet (3.125 mg) She is on Ranexa for microvascular disease which seems to have made a big difference for her. She is on 100 mg losartan for afterload reduction. She is on high-dose statin with pretty well-controlled lipids. Remains on aspirin for prophylaxis.  Okay to hold aspirin 5 days preop for surgical procedures.        Relevant Medications   carvedilol (COREG) 6.25 MG tablet   Chronic stable angina (Chronic)   Relevant Medications   carvedilol (COREG) 6.25 MG tablet   Hyperlipidemia associated with type 2 diabetes mellitus (HCC) (Chronic)    Most recent lipids had excellent  control with LDL 43 on 80 mg atorvastatin.  She seems to be tolerating it relatively well.  For now would like to keep current dose, but low threshold to convert to 40 mg of rosuvastatin to avoid excess muscle fatigue/myalgias.  She is on Humalog insulin and metformin.  Low threshold to consider GLP-1 agonist versus SGLT2 inhibitor => defer to PCP.      Relevant Medications   carvedilol (COREG) 6.25 MG tablet   Essential hypertension (Chronic)    Blood pressure looks pretty well-controlled on losartan, carvedilol and Imdur.  Should be okay to reduce carvedilol dose in the morning.  Try to give her more energy and make her less likely be fatigued/dizzy.      Relevant Medications   carvedilol (COREG) 6.25 MG tablet     Other   Episode of syncope    She had an episode of syncope while at church, fully dressed in full dress attire.  She had not had much to eat for breakfast, and had not had much to drink either.  My suspicion is that she got a little overheated, and being dehydrated in the setting of multiple blood pressure medications she had a drop in blood pressure leading to a syncopal event.  Not necessarily vasovagal, more rate related.  Stressed importance of adequate hydration.  There is no signs or symptoms to suggest that she has had an  arrhythmia.  She has had pretty normal evaluation of her heart today.  If she has more episodes, they may consider an event monitor and reassessing an echo.  She is having some vertigo issues which may be that are worse as well.  Recommend that she discuss with her primary doctor or endocrinologist to see if she could warrant vestibular therapy.       ===================================  HPI:    Karen Gardner is a 66 y.o. female with a PMH notable for CAD (not PCI target) with chronic stable angina, HTN, HLD, DM-2 who presents today for 68-month follow-up. She returns today at the request of Karen Gardner,*.  I last saw Karen Altemose  Gardner back in January.  Noting fatigue.  I wanted her to reduce her morning dose of Coreg to 1/2 tablet (3.125 mg), and increased Imdur to 36 mg daily recommended increased exercise for 10 to 15 minutes at a time.  Considering that symptoms are likely related to deconditioning.  Recent Hospitalizations: None  She was seen by Karen Crumble, PA on September 08, 2021 for postop evaluation.  No chest pain or pressure has try to get back in the routine of walking her dog.  She has been little hesitant because she started to feel a bit syncopal when she was walking her dog.  No dyspnea with ADLs.  Dyspneic at long shopping trip.  Fatigue starting around lunchtime.  Unfortunately had not reduced her Coreg down to 3.125 mg.  Reviewed  CV studies:    The following studies were reviewed today: (if available, images/films reviewed: From Epic Chart or Care Everywhere) None:  Interval History:   Karen Gardner returns here today for 10-month follow-up having had her cataract surgeries done.  She seems to be doing relatively well overall.  She has used nitroglycerin maybe 4 times since she was seen last.  This usually occurs at night when she is trying to settle things down.  But otherwise has not had any exertional chest pain or pressure.  About 3 Sundays ago she had an episode at church where she passed out.  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  She is not sure if she actually truly lost consciousness or simply almost went out.  She denied any sensation of any tachycardia or chest tightness or pressure or pain.  No postictal symptoms.  She did note that she probably had not had anything to eat or drink that day.  She has not had any other episodes of syncope or near-syncope.  No TIA or amaurosis fugax.  No PND, orthopnea or edema. She has noted occasional episodes where her heart is racing but this is relatively rare.  She is not  having exertional chest pain.  She does have some exertional dyspnea but is trying to work on her exercise.  But every now and then when she is exercising she has to stop and sit down to catch her breath.  Some of this is likely because she has dizziness and is working with vestibular PT.  She is accompanied by her sister today who seems to be a better historian.   REVIEWED OF SYSTEMS   Review of Systems  Constitutional:  Positive for malaise/fatigue (More exercise and warrants, and poor balance that limits exercise.). Negative for weight loss.  HENT:  Negative for nosebleeds.   Respiratory:  Negative for shortness of breath.   Cardiovascular:  Negative for claudication.  Gastrointestinal:  Negative for blood in stool and melena.  Genitourinary:  Negative for frequency and hematuria.  Musculoskeletal:  Positive for joint pain (Mostly knees). Negative for myalgias.  Neurological:  Positive for dizziness (Poor balance, vertigo.  Having vestibular symptoms.), loss of consciousness (Per HPI) and weakness (Legs feel weak).  Psychiatric/Behavioral:  Negative for depression (Dysthymia symptoms less prominent.) and memory loss. The patient is nervous/anxious. The patient does not have insomnia.   All other systems reviewed and are negative. Pertinent symptoms noted above in HPI  I have reviewed and (if needed) personally updated the patient's problem list, medications, allergies, past medical and surgical history, social and family history.   PAST MEDICAL HISTORY   Past Medical History:  Diagnosis Date   Allergic rhinitis    Anxiety    Arthritis    Asthma    childhood   Chronic low back pain    CKD (chronic kidney disease), stage III (HCC)    However, most recent creatinine 1.5.   Coronary artery disease involving native heart with other form of angina pectoris, unspecified vessel or lesion type (HCC) 11/16/2020   11/16/2020: [facility].  LAD 1093, LCx 959, RCA  193 (99th percentile) -> COR CTA ([facility] 2437) -moderate to severe multivessel CAD with significant blooming in all 3 epicardial vessels.  CAD RADS 3-4. FFRCT suggests mid RCA occlusion, => CARDIAC CATH 02/02/21: RCA: prox 70%, Mid-distal 90% & distal 90% (small caliber, Not viable PCI target; Prox RI 50%.   Depression    With anxiety   GERD (gastroesophageal reflux disease)    Headache    sinus headaches    Hypercholesterolemia    Hypertension    Iron deficiency anemia 05/26/2015   Has required transfusions-followed by Dr. Myna HidalgoEnnever   Iron malabsorption 05/26/2015   Menopause    Numbness in both hands    mostly at night   Obesity    Osteoarthritis of both knees    Status post right TKA (and ankle) with plans for left TKA   Type 2 diabetes mellitus without complication, with long-term current use of insulin (HCC)    Type 2 on insulin pump   Vasovagal syncope 2018   Negative work-up   Vitamin D deficiency     PAST SURGICAL HISTORY   Past Surgical History:  Procedure Laterality Date   COLONOSCOPY     EYE SURGERY Bilateral    [facility]    HERNIA REPAIR     umb hernia as child   KNEE ARTHROSCOPY  02/12/2012   Procedure: ARTHROSCOPY KNEE;  Surgeon: Nestor LewandowskyFrank J Rowan, MD;  Location: [facility];  Service: Orthopedics;  Laterality: Right;  Partial Lateral Meniscectomy, Debridement chondromalacia   LAPAROSCOPIC GASTRIC SLEEVE RESECTION N/A 03/14/2016   Procedure: LAPAROSCOPIC GASTRIC SLEEVE RESECTION, WITH REPAIR OF HIATAL HERNIA REPAI, AND UPPER ENDO;  Surgeon: Luretha MurphyMatthew Martin, MD;  Location: [facility];  Service: General;  Laterality: N/A;   LEFT HEART CATH AND CORONARY ANGIOGRAPHY N/A 02/02/2021   Procedure: LEFT HEART CATH AND CORONARY ANGIOGRAPHY;  Surgeon: Marykay LexHarding, Bettie Capistran W, MD;  Location: [facility];; Heavily calcified tortuous codominant RCA with proximal 70% stenosis.  Mid to distal RCA 90% stenosis.  Distal RCA 90%.  Not a PCI target.  50% RI, proximal to mid LAD 20%.   Mid to distal LCx 30% with 30% in LP AV-heavily calcified.  EF 55-65%.  Mildly elevated LVEDP.   ORIF ANKLE FRACTURE Right 02/11/2014   Procedure: OPEN REDUCTION  INTERNAL FIXATION (ORIF) RIGHT ANKLE FRACTURE;  Surgeon: Kerin Salen, MD;  Location: [facility];  Service: Orthopedics;  Laterality: Right;   TOTAL KNEE ARTHROPLASTY Right 08/05/2014   Procedure: TOTAL KNEE ARTHROPLASTY;  Surgeon: Frederik Pear, MD;  Location: [facility];  Service: Orthopedics;  Laterality: Right;   TRANSTHORACIC ECHOCARDIOGRAM  05/2016   EF 60 to 65%.  Severe basal septal LVH with mild concentric LVH.  No R WMA.  GR 1 DD.  Indeterminate filling pressures. Minimal valve disease.   TRANSTHORACIC ECHOCARDIOGRAM  03/29/2021   EF 55 to 60%.  No R WMA-normal strain pattern.  GR 1 DD.  Normal RV with RV P mild aortic sclerosis but no stenosis no AI.  Mildly elevated RAP.   TRIGGER FINGER RELEASE Right 10/14/2018   Procedure: RELEASE TRIGGER FINGER/A-1 PULLEY;  Surgeon: Leanora Cover, MD;  Location: [facility];  Service: Orthopedics;  Laterality: Right;   UPPER GI ENDOSCOPY     Cardiac Cath 02/02/2021: Heavily calcified tortuous codominant RCA with proximal 70% stenosis.  Mid to distal RCA 90% stenosis.  Distal RCA 90%.  Not a PCI target.  50% RI, proximal to mid LAD 20%.  Mid to distal LCx 30% with 30% in LP AV-heavily calcified.  EF 55-65%.  Mildly elevated LVEDP. => RCA was not PCI target.  Recommended aggressive risk factor modification.  Started on carvedilol.  Along with aspirin.   Immunization History  Administered Date(s) Administered   Influenza-Unspecified 01/07/2014, 12/03/2014   PPD Test 02/14/2014   Pneumococcal-Unspecified 02/14/2014    MEDICATIONS/ALLERGIES   Current Meds  Medication Sig   aspirin EC 81 MG tablet Take 1 tablet (81 mg total) by mouth daily. 30 minutes prior to taking Isosorbide   atorvastatin (LIPITOR) 80 MG tablet Take 1 tablet by mouth at bedtime.   CALCIUM PO Take 1 capsule by mouth  3 (three) times daily.    carvedilol (COREG) 6.25 MG tablet Take 1 tablet (6.25 mg total) by mouth 2 (two) times daily with a meal.   Continuous Blood Gluc Sensor (FREESTYLE LIBRE 14 DAY SENSOR) MISC Apply topically every 14 (fourteen) days.   DEBROX 6.5 % OTIC solution 5 drops 2 (two) times daily.   diphenhydrAMINE (BENADRYL) 25 MG tablet Take 25 mg by mouth every 6 (six) hours as needed for allergies.   escitalopram (LEXAPRO) 10 MG tablet Take 10 mg by mouth at bedtime.    fluticasone (FLONASE) 50 MCG/ACT nasal spray SPRAY SPRAY 1 SPRAY INTO EACH NOSTRIL EVERY DAY FOR 30 DAYS   glucose blood (ONETOUCH VERIO) test strip Use as instructed to check blood sugar once a day dx code E11.65   Homeopathic Products (LEG CRAMPS) TABS Take 1 tablet by mouth at bedtime.    insulin lispro (HUMALOG) 100 UNIT/ML KwikPen Inject 8 Units into the skin 3 (three) times daily.   isosorbide mononitrate (IMDUR) 60 MG 24 hr tablet TAKE 1 TABLET BY MOUTH AT BEDTIME   lansoprazole (PREVACID) 15 MG capsule Take 15 mg by mouth at bedtime.    losartan (COZAAR) 100 MG tablet Take 100 mg by mouth at bedtime.   metFORMIN (GLUCOPHAGE) 500 MG tablet Take by mouth at bedtime.   Multiple Vitamin (MULTIVITAMIN WITH MINERALS) TABS tablet Take 1 tablet by mouth 2 (two) times daily.   nitroGLYCERIN (NITROSTAT) 0.4 MG SL tablet Place 1 tablet (0.4 mg total) under the tongue every 5 (five) minutes as needed for chest pain.   ranolazine (RANEXA) 500 MG 12 hr tablet TAKE 1  TABLET BY MOUTH EVERY MORNING AND AT BEDTIME    Allergies  Allergen Reactions   Lactose Intolerance (Gi) Diarrhea and Other (See Comments)    *Gas*    Oatmeal Other (See Comments)    "Gas"     SOCIAL HISTORY/FAMILY HISTORY   Reviewed in Epic:  Pertinent findings:  Social History   Tobacco Use   Smoking status: Former    Packs/day: 0.25    Years: 1.00    Total pack years: 0.25    Types: Cigarettes    Quit date: 02/08/1972    Years since quitting:  50.1   Smokeless tobacco: Never  Substance Use Topics   Alcohol use: No    Alcohol/week: 0.0 standard drinks of alcohol   Drug use: No   Social History   Social History Narrative   She is single, 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  Previously worked as a Theme park manager.   She is single, lives alone.  Used to exercise by walking frequently, but with her knee arthritis, not walking as much.    OBJCTIVE -PE, EKG, labs   Wt Readings from Last 3 Encounters:  03/08/22 186 lb 6.4 oz (84.6 kg)  09/08/21 190 lb 9.6 oz (86.5 kg)  04/26/21 201 lb 3.2 oz (91.3 kg)    Physical Exam: BP 130/72 (BP Location: Right Arm, Patient Position: Sitting, Cuff Size: Normal)   Pulse 79   Ht 5\' 5"  (1.651 m)   Wt 186 lb 6.4 oz (84.6 kg)   SpO2 97%   BMI 31.02 kg/m  Physical Exam Vitals reviewed.  Constitutional:      General: She is not in acute distress.    Appearance: She is obese. She is ill-appearing (Chronically ill-appearing.). She is not toxic-appearing.  HENT:     Head: Normocephalic and atraumatic.  Neck:     Vascular: No carotid bruit or JVD.  Cardiovascular:     Rate and Rhythm: Normal rate and regular rhythm. No extrasystoles are present.    Chest Wall: PMI is not displaced.     Pulses: Decreased pulses (Diminished due to mild edema and body habitus).     Heart sounds: S1 normal and S2 normal. Heart sounds are distant. No murmur heard.    No friction rub. No gallop.  Pulmonary:     Effort: Pulmonary effort is normal. No respiratory distress.     Breath sounds: Normal breath sounds. No wheezing, rhonchi or rales.  Chest:     Chest wall: Tenderness (Point tenderness along sternal border.) present.  Musculoskeletal:        General: Swelling (Trivial ankle) present. Normal range of motion.     Cervical back: Normal range of motion and neck supple.  Skin:    General: Skin is warm and dry.  Neurological:     General: No focal deficit present.     Mental Status: She  is alert and oriented to person, place, and time.     Gait: Gait normal.  Psychiatric:        Behavior: Behavior normal.        Thought Content: Thought content normal.     Comments: Mood seems to but less depressed than before.  Less anxious and nervous.  Poor historian.  Defer to sister for many questions      Adult ECG Report Not checked  Recent Labs:   September 29, 2021: TC 133, TG 35, HDL 80, LDL 43. January 13, 2022: A1c 7.6.  Hgb 11.4.  CR 1.32, K+ 3.9. No results found for: "CHOL", "HDL", "LDLCALC", "LDLDIRECT", "TRIG", "CHOLHDL" Lab Results  Component Value Date   CREATININE 1.12 (H) 01/27/2021   BUN 23 01/27/2021   NA 135 01/27/2021   K 4.2 01/27/2021   CL 100 01/27/2021   CO2 24 01/27/2021      Latest Ref Rng & Units 01/27/2021   12:15 PM 03/21/2020   12:06 PM 04/19/2018   10:58 AM  CBC  WBC 3.4 - 10.8 x10E3/uL 3.2  3.8  2.8   Hemoglobin 11.1 - 15.9 g/dL 12.3  12.4  12.8   Hematocrit 34.0 - 46.6 % 39.4  40.5  42.0   Platelets 150 - 450 x10E3/uL 200  193  161     Lab Results  Component Value Date   HGBA1C 6.8 (H) 08/03/2014   No results found for: "TSH"  ================================================== I spent a total of 43 minutes with the patient spent in direct patient consultation.  Additional time spent with chart review  / charting (studies, outside notes, etc): 18 min Total Time: 61 min  Current medicines are reviewed at length with the patient today.  (+/- concerns) none  Notice: This dictation was prepared with Dragon dictation along with smart phrase technology. Any transcriptional errors that result from this process are unintentional and may not be corrected upon review.  Studies Ordered:   No orders of the defined types were placed in this encounter.  Meds ordered this encounter  Medications   carvedilol (COREG) 6.25 MG tablet    Sig: Take 3.125 mg ( 0.5 tablet of 6.25 mg)  in the morning , and 6.25 mg ( 1 tablet of 6.25 mg ) in the  evening    Dispense:  135 tablet    Refill:  3    Discontinue previous dose    Patient Instructions / Medication Changes & Studies & Tests Ordered   Patient Instructions  Other Instructions   Keep hydrate , hydrate   Talk with your primary doctor or Endocrinologist  about vestibular treatment , if you continue to have issue with dizzy   Medication Instructions:    Change in medication:Carvedilol  Take 3.125 mg ( 1/2 tablet of 6.25 mg) in the morning  and  6.25 mg in the evening    *If you need a refill on your cardiac medications before your next appointment, please call your pharmacy*   Lab Work:  Not needed    Testing/Procedures:  Not needed  Follow-Up: At [facility], you and your health needs are our priority.  As part of our continuing mission to provide you with exceptional heart care, we have created designated Provider Care Teams.  These Care Teams include your primary Cardiologist (physician) and Advanced Practice Providers (APPs -  Physician Assistants and Nurse Practitioners) who all work together to provide you with the care you need, when you need it.  We recommend signing up for the patient portal called "MyChart".  Sign up information is provided on this After Visit Summary.  MyChart is used to connect with patients for Virtual Visits (Telemedicine).  Patients are able to view lab/test results, encounter notes, upcoming appointments, etc.  Non-urgent messages can be sent to your provider as well.   To learn more about what you can do with MyChart, go to [email or web address].    Your next appointment:   6 month(s)  The format for your next appointment:   In Person  Provider:   Caron Presume, PA-C  Then, Glenetta Hew, MD will plan to see you again in 12 month(s).   Other Instructions   Keep hydrate , hydrate   Talk with your primary doctor or Endocrinologist  about vestibular treatment , if you continue to have issue with dizzy        Leonie Man, MD, MS Glenetta Hew, M.D., M.S. Interventional Cardiologist  [facility]  Pager # [phone] Phone # [phone] [address]. [place], [place] [address]   Thank you for choosing [facility]!!

## 2022-03-10 ENCOUNTER — Other Ambulatory Visit: Payer: Self-pay | Admitting: Cardiology

## 2022-03-14 DIAGNOSIS — E113293 Type 2 diabetes mellitus with mild nonproliferative diabetic retinopathy without macular edema, bilateral: Secondary | ICD-10-CM | POA: Diagnosis not present

## 2022-03-14 DIAGNOSIS — H401132 Primary open-angle glaucoma, bilateral, moderate stage: Secondary | ICD-10-CM | POA: Diagnosis not present

## 2022-03-16 DIAGNOSIS — I25118 Atherosclerotic heart disease of native coronary artery with other forms of angina pectoris: Secondary | ICD-10-CM | POA: Diagnosis not present

## 2022-03-16 DIAGNOSIS — M8580 Other specified disorders of bone density and structure, unspecified site: Secondary | ICD-10-CM | POA: Diagnosis not present

## 2022-03-16 DIAGNOSIS — N1831 Chronic kidney disease, stage 3a: Secondary | ICD-10-CM | POA: Diagnosis not present

## 2022-03-16 DIAGNOSIS — Z794 Long term (current) use of insulin: Secondary | ICD-10-CM | POA: Diagnosis not present

## 2022-03-16 DIAGNOSIS — I10 Essential (primary) hypertension: Secondary | ICD-10-CM | POA: Diagnosis not present

## 2022-03-16 DIAGNOSIS — E113593 Type 2 diabetes mellitus with proliferative diabetic retinopathy without macular edema, bilateral: Secondary | ICD-10-CM | POA: Diagnosis not present

## 2022-03-16 DIAGNOSIS — H401132 Primary open-angle glaucoma, bilateral, moderate stage: Secondary | ICD-10-CM | POA: Diagnosis not present

## 2022-03-28 ENCOUNTER — Encounter: Payer: Self-pay | Admitting: Cardiology

## 2022-03-28 DIAGNOSIS — R55 Syncope and collapse: Secondary | ICD-10-CM | POA: Insufficient documentation

## 2022-03-28 NOTE — Assessment & Plan Note (Signed)
She had an episode of syncope while at church, fully dressed in full dress attire.  She had not had much to eat for breakfast, and had not had much to drink either.  My suspicion is that she got a little overheated, and being dehydrated in the setting of multiple blood pressure medications she had a drop in blood pressure leading to a syncopal event.  Not necessarily vasovagal, more rate related.  Stressed importance of adequate hydration.  There is no signs or symptoms to suggest that she has had an arrhythmia.  She has had pretty normal evaluation of her heart today.  If she has more episodes, they may consider an event monitor and reassessing an echo.  She is having some vertigo issues which may be that are worse as well.  Recommend that she discuss with her primary doctor or endocrinologist to see if she could warrant vestibular therapy.

## 2022-03-28 NOTE — Assessment & Plan Note (Signed)
Diffuse small vessel disease in the RCA with chronic stable angina.  Minimal disease in the LCA system.  Some of her symptoms are somewhat atypical, she has had 4 times when she is taking nitroglycerin.  Only 4 episodes of mostly in the evening over 6 months is probably a reasonable amount that she is not really having exertional symptoms.   She is on 60 mg of Imdur which I just asked her to take a little earlier in the day. She had some fatigue and dizzy issues.  My plan had been for her to take her morning dose of carvedilol as 1/2 tablet as opposed to a full tablet.  This was not done.  Can make that change now.   Reduce a.m. dose to carvedilol 1/2 tablet (3.125 mg) She is on Ranexa for microvascular disease which seems to have made a big difference for her. She is on 100 mg losartan for afterload reduction. She is on high-dose statin with pretty well-controlled lipids. Remains on aspirin for prophylaxis.  Okay to hold aspirin 5 days preop for surgical procedures.

## 2022-03-28 NOTE — Assessment & Plan Note (Signed)
Blood pressure looks pretty well-controlled on losartan, carvedilol and Imdur.  Should be okay to reduce carvedilol dose in the morning.  Try to give her more energy and make her less likely be fatigued/dizzy.

## 2022-03-28 NOTE — Assessment & Plan Note (Addendum)
Most recent lipids had excellent control with LDL 43 on 80 mg atorvastatin.  She seems to be tolerating it relatively well.  For now would like to keep current dose, but low threshold to convert to 40 mg of rosuvastatin to avoid excess muscle fatigue/myalgias.  She is on Humalog insulin and metformin.  Low threshold to consider GLP-1 agonist versus SGLT2 inhibitor => defer to PCP.

## 2022-03-29 ENCOUNTER — Other Ambulatory Visit: Payer: Self-pay | Admitting: Cardiology

## 2022-04-18 DIAGNOSIS — E113593 Type 2 diabetes mellitus with proliferative diabetic retinopathy without macular edema, bilateral: Secondary | ICD-10-CM | POA: Diagnosis not present

## 2022-04-18 DIAGNOSIS — Z9884 Bariatric surgery status: Secondary | ICD-10-CM | POA: Diagnosis not present

## 2022-04-18 DIAGNOSIS — M8580 Other specified disorders of bone density and structure, unspecified site: Secondary | ICD-10-CM | POA: Diagnosis not present

## 2022-04-18 DIAGNOSIS — N1832 Chronic kidney disease, stage 3b: Secondary | ICD-10-CM | POA: Diagnosis not present

## 2022-04-18 DIAGNOSIS — E669 Obesity, unspecified: Secondary | ICD-10-CM | POA: Diagnosis not present

## 2022-04-18 DIAGNOSIS — Z794 Long term (current) use of insulin: Secondary | ICD-10-CM | POA: Diagnosis not present

## 2022-04-18 DIAGNOSIS — E11319 Type 2 diabetes mellitus with unspecified diabetic retinopathy without macular edema: Secondary | ICD-10-CM | POA: Diagnosis not present

## 2022-04-21 ENCOUNTER — Other Ambulatory Visit: Payer: Self-pay | Admitting: Cardiology

## 2022-05-17 ENCOUNTER — Other Ambulatory Visit: Payer: Self-pay | Admitting: Cardiology

## 2022-05-24 DIAGNOSIS — R0982 Postnasal drip: Secondary | ICD-10-CM | POA: Diagnosis not present

## 2022-05-24 DIAGNOSIS — R0602 Shortness of breath: Secondary | ICD-10-CM | POA: Diagnosis not present

## 2022-05-24 DIAGNOSIS — R519 Headache, unspecified: Secondary | ICD-10-CM | POA: Diagnosis not present

## 2022-07-16 ENCOUNTER — Inpatient Hospital Stay (HOSPITAL_COMMUNITY)
Admission: EM | Admit: 2022-07-16 | Discharge: 2022-07-18 | DRG: 313 | Disposition: A | Discharge status: Home / Self Care | Payer: Medicare Other | Attending: Internal Medicine | Admitting: Internal Medicine

## 2022-07-16 ENCOUNTER — Emergency Department (HOSPITAL_COMMUNITY): Payer: Medicare Other

## 2022-07-16 ENCOUNTER — Encounter (HOSPITAL_COMMUNITY): Payer: Self-pay

## 2022-07-16 ENCOUNTER — Other Ambulatory Visit: Payer: Self-pay

## 2022-07-16 DIAGNOSIS — Z87891 Personal history of nicotine dependence: Secondary | ICD-10-CM

## 2022-07-16 DIAGNOSIS — E1165 Type 2 diabetes mellitus with hyperglycemia: Secondary | ICD-10-CM | POA: Diagnosis not present

## 2022-07-16 DIAGNOSIS — G8929 Other chronic pain: Secondary | ICD-10-CM | POA: Diagnosis present

## 2022-07-16 DIAGNOSIS — E119 Type 2 diabetes mellitus without complications: Secondary | ICD-10-CM | POA: Diagnosis not present

## 2022-07-16 DIAGNOSIS — R0789 Other chest pain: Principal | ICD-10-CM | POA: Diagnosis present

## 2022-07-16 DIAGNOSIS — Z8249 Family history of ischemic heart disease and other diseases of the circulatory system: Secondary | ICD-10-CM | POA: Diagnosis not present

## 2022-07-16 DIAGNOSIS — K449 Diaphragmatic hernia without obstruction or gangrene: Secondary | ICD-10-CM | POA: Diagnosis present

## 2022-07-16 DIAGNOSIS — R079 Chest pain, unspecified: Secondary | ICD-10-CM | POA: Diagnosis not present

## 2022-07-16 DIAGNOSIS — N1831 Chronic kidney disease, stage 3a: Secondary | ICD-10-CM | POA: Diagnosis present

## 2022-07-16 DIAGNOSIS — Z96651 Presence of right artificial knee joint: Secondary | ICD-10-CM | POA: Diagnosis present

## 2022-07-16 DIAGNOSIS — F419 Anxiety disorder, unspecified: Secondary | ICD-10-CM | POA: Diagnosis present

## 2022-07-16 DIAGNOSIS — E739 Lactose intolerance, unspecified: Secondary | ICD-10-CM | POA: Diagnosis present

## 2022-07-16 DIAGNOSIS — E669 Obesity, unspecified: Secondary | ICD-10-CM | POA: Diagnosis present

## 2022-07-16 DIAGNOSIS — D500 Iron deficiency anemia secondary to blood loss (chronic): Secondary | ICD-10-CM | POA: Diagnosis not present

## 2022-07-16 DIAGNOSIS — Z794 Long term (current) use of insulin: Secondary | ICD-10-CM | POA: Diagnosis not present

## 2022-07-16 DIAGNOSIS — E785 Hyperlipidemia, unspecified: Secondary | ICD-10-CM | POA: Diagnosis not present

## 2022-07-16 DIAGNOSIS — E1169 Type 2 diabetes mellitus with other specified complication: Secondary | ICD-10-CM | POA: Diagnosis not present

## 2022-07-16 DIAGNOSIS — F32A Depression, unspecified: Secondary | ICD-10-CM | POA: Diagnosis not present

## 2022-07-16 DIAGNOSIS — Z7984 Long term (current) use of oral hypoglycemic drugs: Secondary | ICD-10-CM

## 2022-07-16 DIAGNOSIS — Z7982 Long term (current) use of aspirin: Secondary | ICD-10-CM | POA: Diagnosis not present

## 2022-07-16 DIAGNOSIS — M545 Low back pain, unspecified: Secondary | ICD-10-CM | POA: Diagnosis present

## 2022-07-16 DIAGNOSIS — Z79899 Other long term (current) drug therapy: Secondary | ICD-10-CM | POA: Diagnosis not present

## 2022-07-16 DIAGNOSIS — Z9641 Presence of insulin pump (external) (internal): Secondary | ICD-10-CM | POA: Diagnosis present

## 2022-07-16 DIAGNOSIS — I25119 Atherosclerotic heart disease of native coronary artery with unspecified angina pectoris: Secondary | ICD-10-CM | POA: Diagnosis present

## 2022-07-16 DIAGNOSIS — Z885 Allergy status to narcotic agent status: Secondary | ICD-10-CM

## 2022-07-16 DIAGNOSIS — E876 Hypokalemia: Secondary | ICD-10-CM | POA: Diagnosis not present

## 2022-07-16 DIAGNOSIS — Z888 Allergy status to other drugs, medicaments and biological substances status: Secondary | ICD-10-CM

## 2022-07-16 DIAGNOSIS — I129 Hypertensive chronic kidney disease with stage 1 through stage 4 chronic kidney disease, or unspecified chronic kidney disease: Secondary | ICD-10-CM | POA: Diagnosis present

## 2022-07-16 DIAGNOSIS — E1122 Type 2 diabetes mellitus with diabetic chronic kidney disease: Secondary | ICD-10-CM | POA: Diagnosis present

## 2022-07-16 DIAGNOSIS — I10 Essential (primary) hypertension: Secondary | ICD-10-CM | POA: Diagnosis not present

## 2022-07-16 DIAGNOSIS — D509 Iron deficiency anemia, unspecified: Secondary | ICD-10-CM | POA: Diagnosis not present

## 2022-07-16 DIAGNOSIS — I2089 Other forms of angina pectoris: Secondary | ICD-10-CM | POA: Diagnosis present

## 2022-07-16 DIAGNOSIS — E7800 Pure hypercholesterolemia, unspecified: Secondary | ICD-10-CM | POA: Diagnosis present

## 2022-07-16 DIAGNOSIS — I25118 Atherosclerotic heart disease of native coronary artery with other forms of angina pectoris: Secondary | ICD-10-CM | POA: Diagnosis not present

## 2022-07-16 DIAGNOSIS — Z6831 Body mass index (BMI) 31.0-31.9, adult: Secondary | ICD-10-CM

## 2022-07-16 DIAGNOSIS — R109 Unspecified abdominal pain: Secondary | ICD-10-CM | POA: Diagnosis not present

## 2022-07-16 DIAGNOSIS — K219 Gastro-esophageal reflux disease without esophagitis: Secondary | ICD-10-CM | POA: Diagnosis present

## 2022-07-16 LAB — BASIC METABOLIC PANEL
Anion gap: 8 (ref 5–15)
BUN: 24 mg/dL — ABNORMAL HIGH (ref 8–23)
CO2: 25 mmol/L (ref 22–32)
Calcium: 8.9 mg/dL (ref 8.9–10.3)
Chloride: 105 mmol/L (ref 98–111)
Creatinine, Ser: 1.15 mg/dL — ABNORMAL HIGH (ref 0.44–1.00)
GFR, Estimated: 52 mL/min — ABNORMAL LOW (ref >60)
Glucose, Bld: 250 mg/dL — ABNORMAL HIGH (ref 70–99)
Potassium: 3.3 mmol/L — ABNORMAL LOW (ref 3.5–5.1)
Sodium: 138 mmol/L (ref 135–145)

## 2022-07-16 LAB — CBC
HCT: 37.1 % (ref 36.0–46.0)
HCT: 34.4 % — ABNORMAL LOW (ref 36.0–46.0)
Hemoglobin: 11.5 g/dL — ABNORMAL LOW (ref 12.0–15.0)
Hemoglobin: 10.8 g/dL — ABNORMAL LOW (ref 12.0–15.0)
MCH: 29.3 pg (ref 26.0–34.0)
MCH: 29.7 pg (ref 26.0–34.0)
MCHC: 31 g/dL (ref 30.0–36.0)
MCHC: 31.4 g/dL (ref 30.0–36.0)
MCV: 94.4 fL (ref 80.0–100.0)
MCV: 94.5 fL (ref 80.0–100.0)
Platelets: 145 10*3/uL — ABNORMAL LOW (ref 150–400)
Platelets: 136 10*3/uL — ABNORMAL LOW (ref 150–400)
RBC: 3.93 MIL/uL (ref 3.87–5.11)
RBC: 3.64 MIL/uL — ABNORMAL LOW (ref 3.87–5.11)
RDW: 12.6 % (ref 11.5–15.5)
RDW: 12.6 % (ref 11.5–15.5)
WBC: 2.9 10*3/uL — ABNORMAL LOW (ref 4.0–10.5)
WBC: 3.2 10*3/uL — ABNORMAL LOW (ref 4.0–10.5)
nRBC: 0 % (ref 0.0–0.2)
nRBC: 0 % (ref 0.0–0.2)

## 2022-07-16 LAB — TROPONIN I (HIGH SENSITIVITY)
Troponin I (High Sensitivity): 5 ng/L (ref <18)
Troponin I (High Sensitivity): 5 ng/L (ref <18)
Troponin I (High Sensitivity): 5 ng/L (ref <18)

## 2022-07-16 LAB — GLUCOSE, CAPILLARY: Glucose-Capillary: 187 mg/dL — ABNORMAL HIGH (ref 70–99)

## 2022-07-16 LAB — CBG MONITORING, ED: Glucose-Capillary: 192 mg/dL — ABNORMAL HIGH (ref 70–99)

## 2022-07-16 LAB — CREATININE, SERUM
Creatinine, Ser: 1.12 mg/dL — ABNORMAL HIGH (ref 0.44–1.00)
GFR, Estimated: 53 mL/min — ABNORMAL LOW (ref >60)

## 2022-07-16 MED ORDER — DOCUSATE SODIUM 100 MG PO CAPS
100.0000 mg | ORAL_CAPSULE | Freq: Two times a day (BID) | ORAL | Status: DC
  Administered 2022-07-16 – 2022-07-18 (×4): 100 mg via ORAL
  Filled 2022-07-16 (×4): qty 1

## 2022-07-16 MED ORDER — CARVEDILOL 3.125 MG PO TABS
3.1250 mg | ORAL_TABLET | Freq: Two times a day (BID) | ORAL | Status: DC
  Administered 2022-07-16 – 2022-07-17 (×3): 3.125 mg via ORAL
  Filled 2022-07-16 (×3): qty 1

## 2022-07-16 MED ORDER — ACETAMINOPHEN 650 MG RE SUPP: 650.0000 mg | Freq: Four times a day (QID) | RECTAL | Status: DC | PRN

## 2022-07-16 MED ORDER — RANOLAZINE ER 500 MG PO TB12
500.0000 mg | ORAL_TABLET | Freq: Two times a day (BID) | ORAL | Status: DC
  Administered 2022-07-16 – 2022-07-18 (×4): 500 mg via ORAL
  Filled 2022-07-16 (×5): qty 1

## 2022-07-16 MED ORDER — OXYCODONE-ACETAMINOPHEN 5-325 MG PO TABS
1.0000 | ORAL_TABLET | Freq: Once | ORAL | Status: AC
  Administered 2022-07-16: 1 via ORAL
  Filled 2022-07-16: qty 1

## 2022-07-16 MED ORDER — IOHEXOL 350 MG/ML SOLN
100.0000 mL | Freq: Once | INTRAVENOUS | Status: AC | PRN
  Administered 2022-07-16: 100 mL via INTRAVENOUS

## 2022-07-16 MED ORDER — INSULIN LISPRO (1 UNIT DIAL) 100 UNIT/ML (KWIKPEN): 7.0000 [IU] | PEN_INJECTOR | Freq: Three times a day (TID) | SUBCUTANEOUS | Status: DC

## 2022-07-16 MED ORDER — ENOXAPARIN SODIUM 40 MG/0.4ML IJ SOSY
40.0000 mg | PREFILLED_SYRINGE | INTRAMUSCULAR | Status: DC
  Administered 2022-07-16 – 2022-07-17 (×2): 40 mg via SUBCUTANEOUS
  Filled 2022-07-16 (×2): qty 0.4

## 2022-07-16 MED ORDER — NITROGLYCERIN 0.4 MG SL SUBL
0.4000 mg | SUBLINGUAL_TABLET | SUBLINGUAL | Status: DC | PRN
  Administered 2022-07-17: 0.4 mg via SUBLINGUAL

## 2022-07-16 MED ORDER — PANTOPRAZOLE SODIUM 20 MG PO TBEC
20.0000 mg | DELAYED_RELEASE_TABLET | Freq: Every day | ORAL | Status: DC
  Administered 2022-07-16 – 2022-07-18 (×3): 20 mg via ORAL
  Filled 2022-07-16 (×3): qty 1

## 2022-07-16 MED ORDER — ONDANSETRON HCL 4 MG PO TABS: 4.0000 mg | ORAL_TABLET | Freq: Four times a day (QID) | ORAL | Status: DC | PRN

## 2022-07-16 MED ORDER — INSULIN ASPART 100 UNIT/ML IJ SOLN
7.0000 [IU] | Freq: Three times a day (TID) | INTRAMUSCULAR | Status: DC
  Administered 2022-07-16 – 2022-07-18 (×4): 7 [IU] via SUBCUTANEOUS
  Filled 2022-07-16: qty 0.07

## 2022-07-16 MED ORDER — ISOSORBIDE MONONITRATE ER 60 MG PO TB24: 60.0000 mg | ORAL_TABLET | Freq: Every day | ORAL | Status: DC

## 2022-07-16 MED ORDER — ALBUTEROL SULFATE (2.5 MG/3ML) 0.083% IN NEBU: 2.5000 mg | INHALATION_SOLUTION | RESPIRATORY_TRACT | Status: DC | PRN

## 2022-07-16 MED ORDER — ACETAMINOPHEN 325 MG PO TABS
650.0000 mg | ORAL_TABLET | Freq: Four times a day (QID) | ORAL | Status: DC | PRN
  Administered 2022-07-16: 650 mg via ORAL
  Filled 2022-07-16 (×2): qty 2

## 2022-07-16 MED ORDER — LOSARTAN POTASSIUM 50 MG PO TABS
100.0000 mg | ORAL_TABLET | Freq: Every day | ORAL | Status: DC
  Administered 2022-07-16: 100 mg via ORAL
  Filled 2022-07-16: qty 2

## 2022-07-16 MED ORDER — ONDANSETRON HCL 4 MG/2ML IJ SOLN: 4.0000 mg | Freq: Four times a day (QID) | INTRAMUSCULAR | Status: DC | PRN

## 2022-07-16 MED ORDER — ASPIRIN 81 MG PO TBEC
81.0000 mg | DELAYED_RELEASE_TABLET | Freq: Every day | ORAL | Status: DC
  Administered 2022-07-17 – 2022-07-18 (×2): 81 mg via ORAL
  Filled 2022-07-16 (×2): qty 1

## 2022-07-16 MED ORDER — ESCITALOPRAM OXALATE 10 MG PO TABS
10.0000 mg | ORAL_TABLET | Freq: Every day | ORAL | Status: DC
  Administered 2022-07-16 – 2022-07-17 (×2): 10 mg via ORAL
  Filled 2022-07-16 (×2): qty 1

## 2022-07-16 MED ORDER — ISOSORBIDE MONONITRATE ER 30 MG PO TB24
30.0000 mg | ORAL_TABLET | Freq: Every day | ORAL | Status: DC
  Administered 2022-07-16 – 2022-07-17 (×2): 30 mg via ORAL
  Filled 2022-07-16 (×3): qty 1

## 2022-07-16 MED ORDER — INSULIN LISPRO (1 UNIT DIAL) 100 UNIT/ML (KWIKPEN): 8.0000 [IU] | PEN_INJECTOR | Freq: Three times a day (TID) | SUBCUTANEOUS | Status: DC

## 2022-07-16 MED ORDER — ATORVASTATIN CALCIUM 80 MG PO TABS
80.0000 mg | ORAL_TABLET | Freq: Every day | ORAL | Status: DC
  Administered 2022-07-16 – 2022-07-17 (×2): 80 mg via ORAL
  Filled 2022-07-16 (×2): qty 1

## 2022-07-16 MED ORDER — INSULIN ASPART 100 UNIT/ML IJ SOLN
0.0000 [IU] | Freq: Three times a day (TID) | INTRAMUSCULAR | Status: DC
  Administered 2022-07-16 – 2022-07-17 (×2): 3 [IU] via SUBCUTANEOUS
  Filled 2022-07-16: qty 0.15

## 2022-07-16 MED ORDER — METOPROLOL TARTRATE 5 MG/5ML IV SOLN: 5.0000 mg | Freq: Four times a day (QID) | INTRAVENOUS | Status: DC | PRN

## 2022-07-16 MED ORDER — ASPIRIN 325 MG PO TABS
325.0000 mg | ORAL_TABLET | Freq: Once | ORAL | Status: AC
  Administered 2022-07-16: 325 mg via ORAL
  Filled 2022-07-16: qty 1

## 2022-07-16 MED ORDER — TRAZODONE HCL 50 MG PO TABS
25.0000 mg | ORAL_TABLET | Freq: Every evening | ORAL | Status: DC | PRN
  Administered 2022-07-16 – 2022-07-17 (×2): 25 mg via ORAL
  Filled 2022-07-16 (×2): qty 1

## 2022-07-16 MED ORDER — POLYETHYLENE GLYCOL 3350 17 G PO PACK: 17.0000 g | PACK | Freq: Every day | ORAL | Status: DC | PRN

## 2022-07-16 MED ORDER — INSULIN ASPART 100 UNIT/ML IJ SOLN
0.0000 [IU] | Freq: Every day | INTRAMUSCULAR | Status: DC
  Filled 2022-07-16: qty 0.05

## 2022-07-16 NOTE — ED Triage Notes (Signed)
Pt c/o central chest pain since 0730 today. Pt states she has hx of angina. Pt c/o SHOB as well. Pt states that pain wraps around the bottom of her right ribs and feels worse than past episodes. Pt denies nausea/vomiting.

## 2022-07-16 NOTE — ED Provider Notes (Signed)
[place] EMERGENCY DEPARTMENT AT [facility] Provider Note   CSN: [id number] Arrival date & time: 07/16/22  1000     History  Chief Complaint  Patient presents with   Chest Pain    Karen Gardner is a 67 y.o. female.  Patient with history of hypertension, diabetes, CAD, CKD, GERD presents today with complaints of chest pain. She states that same was present when she woke up this morning but was mild in nature. She states that she was getting ready for the day around 5 am when her pain suddenly got worse and was sharp and substernal in nature and radiates to her back. She states that this is similar to pain she has had previously which is normally resolved with nitroglycerin, however she took 1 of her home nitroglycerin without relief. She states it is also pleuritic in nature, is also somewhat reproducible to palpation and worse with movement. She denies diaphoresis, nausea, or vomiting.  Pain does not appear to be worse with activity or relieved by rest.  Patient denies leg pain or leg swelling.  She does not smoke.  No recent travel or recent surgeries.  No history of blood clots.  The history is provided by the patient. No language interpreter was used.  Chest Pain      Home Medications Prior to Admission medications   Medication Sig Start Date End Date Taking? Authorizing Provider  aspirin EC 81 MG tablet Take 1 tablet (81 mg total) by mouth daily. 30 minutes prior to taking Isosorbide 03/03/21   Marykay Lex, MD  atorvastatin (LIPITOR) 80 MG tablet Take 1 tablet by mouth at bedtime.    [provider]  CALCIUM PO Take 1 capsule by mouth 3 (three) times daily.     [provider]  carvedilol (COREG) 6.25 MG tablet TAKE 1 TABLET BY MOUTH TWICE DAILY 03/29/22   Marykay Lex, MD  Continuous Blood Gluc Sensor (FREESTYLE LIBRE 14 DAY SENSOR) MISC Apply topically every 14 (fourteen) days. 04/03/21   [provider]  DEBROX 6.5 % OTIC  solution 5 drops 2 (two) times daily. 03/09/21   [provider]  diphenhydrAMINE (BENADRYL) 25 MG tablet Take 25 mg by mouth every 6 (six) hours as needed for allergies.    [provider]  escitalopram (LEXAPRO) 10 MG tablet Take 10 mg by mouth at bedtime.     [provider]  fluticasone (FLONASE) 50 MCG/ACT nasal spray SPRAY SPRAY 1 SPRAY INTO EACH NOSTRIL EVERY DAY FOR 30 DAYS    [provider]  glucose blood (ONETOUCH VERIO) test strip Use as instructed to check blood sugar once a day dx code E11.65 02/04/14   Reather Littler, MD  Homeopathic Products (LEG CRAMPS) TABS Take 1 tablet by mouth at bedtime.    [provider]  insulin lispro (HUMALOG) 100 UNIT/ML KwikPen Inject 8 Units into the skin 3 (three) times daily.    [provider]  isosorbide mononitrate (IMDUR) 60 MG 24 hr tablet TAKE 1 TABLET BY MOUTH AT BEDTIME 30 MINUTES AFTER TAKING ASPIRIN 81 MG 03/10/22   Marykay Lex, MD  lansoprazole (PREVACID) 15 MG capsule Take 15 mg by mouth at bedtime.     [provider]  losartan (COZAAR) 100 MG tablet Take 100 mg by mouth at bedtime.    [provider]  metFORMIN (GLUCOPHAGE) 500 MG tablet Take by mouth at bedtime.    [provider]  Multiple Vitamin (MULTIVITAMIN WITH  MINERALS) TABS tablet Take 1 tablet by mouth 2 (two) times daily.    [provider]  nitroGLYCERIN (NITROSTAT) 0.4 MG SL tablet Place 1 tablet (0.4 mg total) under the tongue every 5 (five) minutes as needed for chest pain. 01/23/22   Marykay Lex, MD  ranolazine (RANEXA) 500 MG 12 hr tablet TAKE 1 TABLET EVERY MORNING AND AT BEDTIME 05/17/22   Marykay Lex, MD      Allergies    Codeine, Lactose intolerance (gi), and Oatmeal    Review of Systems   Review of Systems  Cardiovascular:  Positive for chest pain.  All other systems reviewed and are negative.   Physical Exam Updated Vital Signs BP (!) 190/99   Pulse (!) 59    Temp 97.7 F (36.5 C)   Resp 17   Ht  (1.651 m)   Wt 86.2 kg   SpO2 100%   BMI 31.62 kg/m  Physical Exam Vitals and nursing note reviewed.  Constitutional:      General: She is not in acute distress.    Appearance: Normal appearance. She is normal weight. She is not ill-appearing, toxic-appearing or diaphoretic.  HENT:     Head: Normocephalic and atraumatic.  Cardiovascular:     Rate and Rhythm: Normal rate and regular rhythm.     Pulses:          Carotid pulses are 2+ on the right side and 2+ on the left side.      Radial pulses are 2+ on the right side and 2+ on the left side.       Dorsalis pedis pulses are 2+ on the right side and 2+ on the left side.     Heart sounds: Normal heart sounds.  Pulmonary:     Effort: Pulmonary effort is normal. No respiratory distress.     Breath sounds: Normal breath sounds.  Abdominal:     Palpations: Abdomen is soft.     Tenderness: There is no abdominal tenderness.  Musculoskeletal:        General: Normal range of motion.     Cervical back: Normal range of motion.     Right lower leg: No tenderness. No edema.     Left lower leg: No tenderness. No edema.  Skin:    General: Skin is warm and dry.  Neurological:     General: No focal deficit present.     Mental Status: She is alert.  Psychiatric:        Mood and Affect: Mood normal.        Behavior: Behavior normal.     ED Results / Procedures / Treatments   Labs (all labs ordered are listed, but only abnormal results are displayed) Labs Reviewed  BASIC METABOLIC PANEL - Abnormal; Notable for the following components:      Result Value   Potassium 3.3 (*)    Glucose, Bld 250 (*)    BUN 24 (*)    Creatinine, Ser 1.15 (*)    GFR, Estimated 52 (*)    All other components within normal limits  CBC - Abnormal; Notable for the following components:   WBC 2.9 (*)    Hemoglobin 11.5 (*)    Platelets 145 (*)    All other components within normal limits  TROPONIN I (HIGH  SENSITIVITY)  TROPONIN I (HIGH SENSITIVITY)    EKG EKG Interpretation  Date/Time:  Sunday July 16 2022 13:58:18 EDT Ventricular Rate:  59 PR Interval:  158  QRS Duration: 91 QT Interval:  416 QTC Calculation: 413 R Axis:   52 Text Interpretation: Sinus arrhythmia Confirmed by Vivien Rossetti ([id number]) on 07/16/2022 2:01:48 PM  Radiology CT Angio Chest/Abd/Pel for Dissection W and/or Wo Contrast  Result Date: 07/16/2022 CLINICAL DATA:  Acute abdominal pain. EXAM: CT ANGIOGRAPHY CHEST, ABDOMEN AND PELVIS TECHNIQUE: Non-contrast CT of the chest was initially obtained. Multidetector CT imaging through the chest, abdomen and pelvis was performed using the standard protocol during bolus administration of intravenous contrast. Multiplanar reconstructed images and MIPs were obtained and reviewed to evaluate the vascular anatomy. RADIATION DOSE REDUCTION: This exam was performed according to the departmental dose-optimization program which includes automated exposure control, adjustment of the mA and/or kV according to patient size and/or use of iterative reconstruction technique. CONTRAST:  OMNIPAQUE IOHEXOL 350 MG/ML SOLN COMPARISON:  January 17, 2021.  April 07, 2014.  April 03, 2009. FINDINGS: CTA CHEST FINDINGS Cardiovascular: Preferential opacification of the thoracic aorta. No evidence of thoracic aortic aneurysm or dissection. Normal heart size. No pericardial effusion. Mediastinum/Nodes: Moderate size hiatal hernia is noted with postsurgical changes present. Thyroid gland is unremarkable. No definite adenopathy is noted. Lungs/Pleura: Lungs are clear. No pleural effusion or pneumothorax. Musculoskeletal: No chest wall abnormality. No acute or significant osseous findings. Review of the MIP images confirms the above findings. CTA ABDOMEN AND PELVIS FINDINGS VASCULAR Aorta: Atherosclerosis of abdominal aorta is noted without aneurysm or dissection. Celiac: Patent without evidence of aneurysm,  dissection, vasculitis or significant stenosis. SMA: Patent without evidence of aneurysm, dissection, vasculitis or significant stenosis. Renals: Both renal arteries are patent without evidence of aneurysm, dissection, vasculitis, fibromuscular dysplasia or significant stenosis. IMA: Patent without evidence of aneurysm, dissection, vasculitis or significant stenosis. Inflow: Patent without evidence of aneurysm, dissection, vasculitis or significant stenosis. Veins: No obvious venous abnormality within the limitations of this arterial phase study. Review of the MIP images confirms the above findings. NON-VASCULAR Hepatobiliary: No focal liver abnormality is seen. No gallstones, gallbladder wall thickening, or biliary dilatation. Pancreas: Unremarkable. No pancreatic ductal dilatation or surrounding inflammatory changes. Spleen: Normal in size without focal abnormality. Adrenals/Urinary Tract: Adrenal glands are unremarkable. Kidneys are normal, without renal calculi, focal lesion, or hydronephrosis. Bladder is unremarkable. Stomach/Bowel: There is no evidence of bowel obstruction or inflammation. The appendix appears normal. Lymphatic: No adenopathy is noted. Reproductive: Calcified uterine fibroid is noted. No adnexal abnormality. Other: No abdominal wall hernia or abnormality. No abdominopelvic ascites. Musculoskeletal: No acute or significant osseous findings. Review of the MIP images confirms the above findings. IMPRESSION: No evidence of thoracic or abdominal aortic dissection or aneurysm. Moderate size sliding-type hiatal hernia. Calcified uterine fibroid. Aortic Atherosclerosis (ICD10-I70.0). Electronically Signed   By: Lupita Raider M.D.   On: 07/16/2022 13:41   DG Chest 2 View  Result Date: 07/16/2022 CLINICAL DATA:  Central chest pain EXAM: CHEST - 2 VIEW COMPARISON:  Chest x-ray dated 01/12/2016 FINDINGS: Heart size and mediastinal contours are within normal limits. Lungs are clear. No pleural  effusion or pneumothorax is seen. No acute-appearing osseous abnormality. IMPRESSION: No active cardiopulmonary disease. No evidence of pneumonia or pulmonary edema. Electronically Signed   By: Bary Richard M.D.   On: 07/16/2022 10:52    Procedures Procedures    Medications Ordered in ED Medications  oxyCODONE-acetaminophen (PERCOCET/ROXICET) 5-325 MG per tablet 1 tablet (has no administration in time range)  iohexol (OMNIPAQUE) 350 MG/ML injection 100 mL (100 mLs Intravenous Contrast Given 07/16/22 1302)    ED Course/ Medical  Decision Making/ A&P             HEART Score: 4                Medical Decision Making Amount and/or Complexity of Data Reviewed Labs: ordered. Radiology: ordered.  Risk Prescription drug management.   This patient is a 67 y.o. female who presents to the ED for concern of chest pain, this involves an extensive number of treatment options, and is a complaint that carries with it a high risk of complications and morbidity. The emergent differential diagnosis prior to evaluation includes, but is not limited to,  ACS, pericarditis, myocarditis, aortic dissection, PE, pneumothorax, esophageal spasm or rupture, chronic angina, pneumonia, bronchitis, GERD, reflux/PUD, biliary disease, pancreatitis, costochondritis, anxiety  This is not an exhaustive differential.   Past Medical History / Co-morbidities / Social History: history of hypertension, diabetes, CAD, CKD, GERD  Additional history: Chart reviewed. Pertinent results include: Last echo was 12/22 and showed EF 55-60%. Also had cath in 11/22 that showed Moderate-severe diffusely calcified coronary arteries with severe single-vessel disease involving a small caliber codominant RCA with multiple segments of 70 to 90% stenoses-best treated medically. Patient sees cardiology MD Dr. Herbie Baltimore  Physical Exam: Physical exam performed. The pertinent findings include: Patient obviously uncomfortable, exam otherwise  benign.  Lab Tests: I ordered, and personally interpreted labs.  The pertinent results include:  WBC 2.9, hgb 11.5, K 3.3, creatinine 1.15 consistent with baseline.    Imaging Studies: I ordered imaging studies including CXR, CTA dissection. I independently visualized and interpreted imaging which showed   CXR: No active cardiopulmonary disease. No evidence of pneumonia or pulmonary edema.  CTA: No evidence of thoracic or abdominal aortic dissection or aneurysm.   Moderate size sliding-type hiatal hernia.   Calcified uterine fibroid.  I agree with the radiologist interpretation.   Cardiac Monitoring:  The patient was maintained on a cardiac monitor.  My attending physician Dr. Elpidio Anis  viewed and interpreted the cardiac monitored which showed an underlying rhythm of: sinus rhythm, no STEMI. I agree with this interpretation.   Medications: I ordered medication including percocet  for pain. Reevaluation of the patient after these medicines showed that the patient improved. I have reviewed the patients home medicines and have made adjustments as needed.  Consultations Obtained: I requested consultation with the cardiology on call Dr. Shari Prows,  and discussed lab and imaging findings as well as pertinent plan - they recommend: admit for observation overnight. Plan for repeat echo tonight and consult Dr. Herbie Baltimore for possible cath   Disposition:  Patient presents today with complaints of chest pain since this morning not relieved by nitroglycerin. Heart score 4. After 4 hours of monitoring, patient continues to have pain which she states is now feeling more like pressure than the sharp pain she felt this morning. Repeat EKG unchanged. Given her persistent pain with high heart score, plan to admit for observation, repeat echo and possibly cath tomorrow. Patient is understanding and in agreement.   Discussed patient with hospitalist who agrees to admit.  Findings and plan of care discussed  with supervising physician Dr. Elpidio Anis who is in agreement.    Final Clinical Impression(s) / ED Diagnoses Final diagnoses:  Chest pain, unspecified type    Rx / DC Orders ED Discharge Orders     None         Vear Clock 07/16/22 1515    Mardene Sayer, MD 07/16/22 [phone]

## 2022-07-16 NOTE — ED Notes (Signed)
Patient transported to CT 

## 2022-07-16 NOTE — ED Notes (Signed)
Pt called out stating it feels like an elephant sitting on her chest. ED tech captured EKG.

## 2022-07-16 NOTE — H&P (Signed)
History and Physical  Karen Gardner:[id number] DOB: [birth year] DOA: 07/16/2022  PCP: [name], MD   Chief Complaint: Chest pain  HPI: Karen Gardner is a 67 y.o. female with medical history significant for insulin-dependent type 2 diabetes, GERD, anxiety, coronary artery disease with chronic atypical anginal symptoms being admitted to the hospital for observation with complaints of chest pain.  Patient states she does have a history of intermittent chest pressure and discomfort not associated with activity.  However in the last 24 hours, she has been having severe sharp stabbing in the center of her chest radiating to the right side of the chest.  She denies any overlying rash, there is no association with activity.  It hurts more with deep breaths, moves her right arm.  She has a cough but this is chronic for her, no fevers, no chills, no nausea, vomiting, etc.  ED Course: Evaluation in the ER reveals relatively normal vital signs except for hypertension, blood pressure as high as 190/99.  Lab work with chronic anemia hemoglobin 12, potassium 3.3, creatinine 1.15.  Glucose 250.  Troponin 5 x 2, third troponin pending.  ER provider discussed with on-call cardiology, who recommends repeat echo tonight, and consideration of cardiac catheterization after formal cardiac consult in the morning.  Review of Systems: Please see HPI for pertinent positives and negatives. A complete 10 system review of systems are otherwise negative.  Past Medical History:  Diagnosis Date   Allergic rhinitis    Anxiety    Arthritis    Asthma    childhood   Chronic low back pain    CKD (chronic kidney disease), stage III    However, most recent creatinine 1.5.   Coronary artery disease involving native heart with other form of angina pectoris, unspecified vessel or lesion type 11/16/2020   11/16/2020: CORONARY CA++ SCORE: Agatston Score 2245.  LAD 1093, LCx 959, RCA 193 (99th percentile) -> COR  CTA (Agatston 2437) -moderate to severe multivessel CAD with significant blooming in all 3 epicardial vessels.  CAD RADS 3-4. FFRCT suggests mid RCA occlusion, => CARDIAC CATH 02/02/21: RCA: prox 70%, Mid-distal 90% & distal 90% (small caliber, Not viable PCI target; Prox RI 50%.   Depression    With anxiety   GERD (gastroesophageal reflux disease)    Headache    sinus headaches    Hypercholesterolemia    Hypertension    Iron deficiency anemia 05/26/2015   Has required transfusions-followed by Dr. Myna Hidalgo   Iron malabsorption 05/26/2015   Menopause    Numbness in both hands    mostly at night   Obesity    Osteoarthritis of both knees    Status post right TKA (and ankle) with plans for left TKA   Type 2 diabetes mellitus without complication, with long-term current use of insulin    Type 2 on insulin pump   Vasovagal syncope 2018   Negative work-up   Vitamin D deficiency    Past Surgical History:  Procedure Laterality Date   COLONOSCOPY     EYE SURGERY Bilateral    [facility]    HERNIA REPAIR     umb hernia as child   KNEE ARTHROSCOPY  02/12/2012   Procedure: ARTHROSCOPY KNEE;  Surgeon: Nestor Lewandowsky, MD;  Location: [facility];  Service: Orthopedics;  Laterality: Right;  Partial Lateral Meniscectomy, Debridement chondromalacia   LAPAROSCOPIC GASTRIC SLEEVE RESECTION N/A 03/14/2016   Procedure: LAPAROSCOPIC GASTRIC SLEEVE RESECTION, WITH REPAIR OF HIATAL HERNIA REPAI, AND  UPPER ENDO;  Surgeon: Luretha Murphy, MD;  Location: [facility];  Service: General;  Laterality: N/A;   LEFT HEART CATH AND CORONARY ANGIOGRAPHY N/A 02/02/2021   Procedure: LEFT HEART CATH AND CORONARY ANGIOGRAPHY;  Surgeon: Marykay Lex, MD;  Location: [facility] INVASIVE CV LAB;; Heavily calcified tortuous codominant RCA with proximal 70% stenosis.  Mid to distal RCA 90% stenosis.  Distal RCA 90%.  Not a PCI target.  50% RI, proximal to mid LAD 20%.  Mid to distal LCx 30% with 30% in LP AV-heavily calcified.  EF  55-65%.  Mildly elevated LVEDP.   ORIF ANKLE FRACTURE Right 02/11/2014   Procedure: OPEN REDUCTION INTERNAL FIXATION (ORIF) RIGHT ANKLE FRACTURE;  Surgeon: Nestor Lewandowsky, MD;  Location: [facility];  Service: Orthopedics;  Laterality: Right;   TOTAL KNEE ARTHROPLASTY Right 08/05/2014   Procedure: TOTAL KNEE ARTHROPLASTY;  Surgeon: Gean Birchwood, MD;  Location: [facility];  Service: Orthopedics;  Laterality: Right;   TRANSTHORACIC ECHOCARDIOGRAM  05/2016   EF 60 to 65%.  Severe basal septal LVH with mild concentric LVH.  No R WMA.  GR 1 DD.  Indeterminate filling pressures. Minimal valve disease.   TRANSTHORACIC ECHOCARDIOGRAM  03/29/2021   EF 55 to 60%.  No R WMA-normal strain pattern.  GR 1 DD.  Normal RV with RV P mild aortic sclerosis but no stenosis no AI.  Mildly elevated RAP.   TRIGGER FINGER RELEASE Right 10/14/2018   Procedure: RELEASE TRIGGER FINGER/A-1 PULLEY;  Surgeon: Betha Loa, MD;  Location: [facility];  Service: Orthopedics;  Laterality: Right;   UPPER GI ENDOSCOPY      Social History:  reports that she quit smoking about 50 years ago. Her smoking use included cigarettes. She has a 0.25 pack-year smoking history. She has never used smokeless tobacco. She reports that she does not drink alcohol and does not use drugs.   Allergies  Allergen Reactions   Codeine Diarrhea   Lactose Intolerance (Gi) Diarrhea and Other (See Comments)    *Gas*    Oatmeal Other (See Comments)    "Gas"     Family History  Problem Relation Age of Onset   Alcohol abuse Father    Heart attack Sister 28     Prior to Admission medications   Medication Sig Start Date End Date Taking? Authorizing Provider  aspirin EC 81 MG tablet Take 1 tablet (81 mg total) by mouth daily. 30 minutes prior to taking Isosorbide 03/03/21   Marykay Lex, MD  atorvastatin (LIPITOR) 80 MG tablet Take 1 tablet by mouth at bedtime.    [provider]  CALCIUM PO Take 1 capsule by mouth 3 (three) times  daily.     [provider]  carvedilol (COREG) 6.25 MG tablet TAKE 1 TABLET BY MOUTH TWICE DAILY 03/29/22   Marykay Lex, MD  Continuous Blood Gluc Sensor (FREESTYLE LIBRE 14 DAY SENSOR) MISC Apply topically every 14 (fourteen) days. 04/03/21   [provider]  DEBROX 6.5 % OTIC solution 5 drops 2 (two) times daily. 03/09/21   [provider]  diphenhydrAMINE (BENADRYL) 25 MG tablet Take 25 mg by mouth every 6 (six) hours as needed for allergies.    [provider]  escitalopram (LEXAPRO) 10 MG tablet Take 10 mg by mouth at bedtime.     [provider]  fluticasone (FLONASE) 50 MCG/ACT nasal spray SPRAY SPRAY 1 SPRAY INTO EACH NOSTRIL EVERY DAY FOR 30 DAYS    [provider]  glucose blood (ONETOUCH VERIO) test strip Use as instructed to check blood sugar once a day dx code E11.65 02/04/14   Reather Littler, MD  Homeopathic Products (LEG CRAMPS) TABS Take 1 tablet by mouth at bedtime.    [provider]  insulin lispro (HUMALOG) 100 UNIT/ML KwikPen Inject 8 Units into the skin 3 (three) times daily.    [provider]  isosorbide mononitrate (IMDUR) 60 MG 24 hr tablet TAKE 1 TABLET BY MOUTH AT BEDTIME 30 MINUTES AFTER TAKING ASPIRIN 81 MG 03/10/22   Marykay Lex, MD  lansoprazole (PREVACID) 15 MG capsule Take 15 mg by mouth at bedtime.     [provider]  losartan (COZAAR) 100 MG tablet Take 100 mg by mouth at bedtime.    [provider]  metFORMIN (GLUCOPHAGE) 500 MG tablet Take by mouth at bedtime.    [provider]  Multiple Vitamin (MULTIVITAMIN WITH MINERALS) TABS tablet Take 1 tablet by mouth 2 (two) times daily.    [provider]  nitroGLYCERIN (NITROSTAT) 0.4 MG SL tablet Place 1 tablet (0.4 mg total) under the tongue every 5 (five) minutes as needed for chest pain. 01/23/22   Marykay Lex, MD  ranolazine (RANEXA) 500 MG 12 hr tablet TAKE 1 TABLET EVERY MORNING AND AT BEDTIME  05/17/22   Marykay Lex, MD    Physical Exam: BP (!) 190/99   Pulse (!) 59   Temp 97.7 F (36.5 C)   Resp 17   Ht  (1.651 m)   Wt 86.2 kg   SpO2 100%   BMI 31.62 kg/m   General:  Alert, oriented, calm, in no acute distress  Eyes: EOMI, clear conjuctivae, white sclerea Neck: supple, no masses, trachea mildline  Cardiovascular: RRR, no murmurs or rubs, no peripheral edema-chest wall is tender to palpation, with no overlying skin lesions, no rash Respiratory: clear to auscultation bilaterally, no wheezes, no crackles  Abdomen: soft, nontender, nondistended, normal bowel tones heard  Skin: dry, no rashes  Musculoskeletal: no joint effusions, normal range of motion  Psychiatric: appropriate affect, normal speech  Neurologic: extraocular muscles intact, clear speech, moving all extremities with intact sensorium          Labs on Admission:  Basic Metabolic Panel: Recent Labs  Lab 07/16/22 1025  NA 138  K 3.3*  CL 105  CO2 25  GLUCOSE 250*  BUN 24*  CREATININE 1.15*  CALCIUM 8.9   Liver Function Tests: No results for input(s): "AST", "ALT", "ALKPHOS", "BILITOT", "PROT", "ALBUMIN" in the last 168 hours. No results for input(s): "LIPASE", "AMYLASE" in the last 168 hours. No results for input(s): "AMMONIA" in the last 168 hours. CBC: Recent Labs  Lab 07/16/22 1025  WBC 2.9*  HGB 11.5*  HCT 37.1  MCV 94.4  PLT 145*   Cardiac Enzymes: No results for input(s): "CKTOTAL", "CKMB", "CKMBINDEX", "TROPONINI" in the last 168 hours.  BNP (last 3 results) No results for input(s): "BNP" in the last 8760 hours.  ProBNP (last 3 results) No results for input(s): "PROBNP" in the last 8760 hours.  CBG: No results for input(s): "GLUCAP" in the last 168 hours.  Radiological Exams on Admission: CT Angio Chest/Abd/Pel for Dissection W and/or Wo Contrast  Result Date: 07/16/2022 CLINICAL DATA:  Acute abdominal pain. EXAM: CT ANGIOGRAPHY CHEST, ABDOMEN AND PELVIS  TECHNIQUE: Non-contrast CT of the chest was initially obtained. Multidetector CT imaging through the chest, abdomen and pelvis was performed using the standard protocol during bolus  administration of intravenous contrast. Multiplanar reconstructed images and MIPs were obtained and reviewed to evaluate the vascular anatomy. RADIATION DOSE REDUCTION: This exam was performed according to the departmental dose-optimization program which includes automated exposure control, adjustment of the mA and/or kV according to patient size and/or use of iterative reconstruction technique. CONTRAST:  OMNIPAQUE IOHEXOL 350 MG/ML SOLN COMPARISON:  January 17, 2021.  April 07, 2014.  April 03, 2009. FINDINGS: CTA CHEST FINDINGS Cardiovascular: Preferential opacification of the thoracic aorta. No evidence of thoracic aortic aneurysm or dissection. Normal heart size. No pericardial effusion. Mediastinum/Nodes: Moderate size hiatal hernia is noted with postsurgical changes present. Thyroid gland is unremarkable. No definite adenopathy is noted. Lungs/Pleura: Lungs are clear. No pleural effusion or pneumothorax. Musculoskeletal: No chest wall abnormality. No acute or significant osseous findings. Review of the MIP images confirms the above findings. CTA ABDOMEN AND PELVIS FINDINGS VASCULAR Aorta: Atherosclerosis of abdominal aorta is noted without aneurysm or dissection. Celiac: Patent without evidence of aneurysm, dissection, vasculitis or significant stenosis. SMA: Patent without evidence of aneurysm, dissection, vasculitis or significant stenosis. Renals: Both renal arteries are patent without evidence of aneurysm, dissection, vasculitis, fibromuscular dysplasia or significant stenosis. IMA: Patent without evidence of aneurysm, dissection, vasculitis or significant stenosis. Inflow: Patent without evidence of aneurysm, dissection, vasculitis or significant stenosis. Veins: No obvious venous abnormality within the limitations of  this arterial phase study. Review of the MIP images confirms the above findings. NON-VASCULAR Hepatobiliary: No focal liver abnormality is seen. No gallstones, gallbladder wall thickening, or biliary dilatation. Pancreas: Unremarkable. No pancreatic ductal dilatation or surrounding inflammatory changes. Spleen: Normal in size without focal abnormality. Adrenals/Urinary Tract: Adrenal glands are unremarkable. Kidneys are normal, without renal calculi, focal lesion, or hydronephrosis. Bladder is unremarkable. Stomach/Bowel: There is no evidence of bowel obstruction or inflammation. The appendix appears normal. Lymphatic: No adenopathy is noted. Reproductive: Calcified uterine fibroid is noted. No adnexal abnormality. Other: No abdominal wall hernia or abnormality. No abdominopelvic ascites. Musculoskeletal: No acute or significant osseous findings. Review of the MIP images confirms the above findings. IMPRESSION: No evidence of thoracic or abdominal aortic dissection or aneurysm. Moderate size sliding-type hiatal hernia. Calcified uterine fibroid. Aortic Atherosclerosis (ICD10-I70.0). Electronically Signed   By: Lupita Raider M.D.   On: 07/16/2022 13:41   DG Chest 2 View  Result Date: 07/16/2022 CLINICAL DATA:  Central chest pain EXAM: CHEST - 2 VIEW COMPARISON:  Chest x-ray dated 01/12/2016 FINDINGS: Heart size and mediastinal contours are within normal limits. Lungs are clear. No pleural effusion or pneumothorax is seen. No acute-appearing osseous abnormality. IMPRESSION: No active cardiopulmonary disease. No evidence of pneumonia or pulmonary edema. Electronically Signed   By: Bary Richard M.D.   On: 07/16/2022 10:52    Assessment/Plan Principal Problem:   Atypical chest pain labs-the patient clearly has musculoskeletal chest pain, however she will be observed for observation given her cardiac history of known coronary disease, and atypical anginal symptoms.  She will be admitted to [facility], in case  she requires cardiac catheterization tomorrow. -Observation admission to telemetry -Cardiac diabetic diet, n.p.o. at midnight -Patient given full dose aspirin -Chronic cardiac medications will be continued -Anticipate formal cardiology evaluation in the morning Active Problems:   Hyperlipidemia associated with type 2 diabetes mellitus   DM2 (diabetes mellitus, type 2)-diabetic diet when eating, with 7 units Humalog before meals and SSI   Iron deficiency anemia   Coronary artery disease involving native coronary artery of native heart with angina pectoris  Chronic stable angina  DVT prophylaxis: Lovenox     Code Status: Full Code  Consults called: None  Admission status: Observation  Time spent: I45 minutes  Kyrese Gartman Sharlette Dense MD [facility] Pager [phone]  If 7PM-7AM, please contact night-coverage [email or web address] Password [email or web address]  07/16/2022, 4:09 PM

## 2022-07-17 ENCOUNTER — Observation Stay (HOSPITAL_COMMUNITY): Payer: Medicare Other

## 2022-07-17 DIAGNOSIS — Z96651 Presence of right artificial knee joint: Secondary | ICD-10-CM | POA: Diagnosis present

## 2022-07-17 DIAGNOSIS — F419 Anxiety disorder, unspecified: Secondary | ICD-10-CM | POA: Diagnosis present

## 2022-07-17 DIAGNOSIS — E119 Type 2 diabetes mellitus without complications: Secondary | ICD-10-CM | POA: Diagnosis not present

## 2022-07-17 DIAGNOSIS — E1165 Type 2 diabetes mellitus with hyperglycemia: Secondary | ICD-10-CM | POA: Diagnosis not present

## 2022-07-17 DIAGNOSIS — E785 Hyperlipidemia, unspecified: Secondary | ICD-10-CM | POA: Diagnosis not present

## 2022-07-17 DIAGNOSIS — Z8249 Family history of ischemic heart disease and other diseases of the circulatory system: Secondary | ICD-10-CM | POA: Diagnosis not present

## 2022-07-17 DIAGNOSIS — I10 Essential (primary) hypertension: Secondary | ICD-10-CM

## 2022-07-17 DIAGNOSIS — E7800 Pure hypercholesterolemia, unspecified: Secondary | ICD-10-CM | POA: Diagnosis present

## 2022-07-17 DIAGNOSIS — D500 Iron deficiency anemia secondary to blood loss (chronic): Secondary | ICD-10-CM

## 2022-07-17 DIAGNOSIS — E876 Hypokalemia: Secondary | ICD-10-CM | POA: Diagnosis not present

## 2022-07-17 DIAGNOSIS — E1169 Type 2 diabetes mellitus with other specified complication: Secondary | ICD-10-CM

## 2022-07-17 DIAGNOSIS — R0789 Other chest pain: Secondary | ICD-10-CM | POA: Diagnosis present

## 2022-07-17 DIAGNOSIS — I25119 Atherosclerotic heart disease of native coronary artery with unspecified angina pectoris: Secondary | ICD-10-CM | POA: Diagnosis not present

## 2022-07-17 DIAGNOSIS — F32A Depression, unspecified: Secondary | ICD-10-CM | POA: Diagnosis present

## 2022-07-17 DIAGNOSIS — Z7982 Long term (current) use of aspirin: Secondary | ICD-10-CM | POA: Diagnosis not present

## 2022-07-17 DIAGNOSIS — D509 Iron deficiency anemia, unspecified: Secondary | ICD-10-CM | POA: Diagnosis present

## 2022-07-17 DIAGNOSIS — I25118 Atherosclerotic heart disease of native coronary artery with other forms of angina pectoris: Secondary | ICD-10-CM | POA: Diagnosis present

## 2022-07-17 DIAGNOSIS — Z79899 Other long term (current) drug therapy: Secondary | ICD-10-CM | POA: Diagnosis not present

## 2022-07-17 DIAGNOSIS — Z9641 Presence of insulin pump (external) (internal): Secondary | ICD-10-CM | POA: Diagnosis present

## 2022-07-17 DIAGNOSIS — E739 Lactose intolerance, unspecified: Secondary | ICD-10-CM | POA: Diagnosis present

## 2022-07-17 DIAGNOSIS — Z87891 Personal history of nicotine dependence: Secondary | ICD-10-CM | POA: Diagnosis not present

## 2022-07-17 DIAGNOSIS — R079 Chest pain, unspecified: Secondary | ICD-10-CM

## 2022-07-17 DIAGNOSIS — K449 Diaphragmatic hernia without obstruction or gangrene: Secondary | ICD-10-CM | POA: Diagnosis present

## 2022-07-17 DIAGNOSIS — Z7984 Long term (current) use of oral hypoglycemic drugs: Secondary | ICD-10-CM | POA: Diagnosis not present

## 2022-07-17 DIAGNOSIS — Z794 Long term (current) use of insulin: Secondary | ICD-10-CM | POA: Diagnosis not present

## 2022-07-17 DIAGNOSIS — E669 Obesity, unspecified: Secondary | ICD-10-CM | POA: Diagnosis present

## 2022-07-17 DIAGNOSIS — N1831 Chronic kidney disease, stage 3a: Secondary | ICD-10-CM | POA: Diagnosis present

## 2022-07-17 DIAGNOSIS — E1122 Type 2 diabetes mellitus with diabetic chronic kidney disease: Secondary | ICD-10-CM | POA: Diagnosis present

## 2022-07-17 DIAGNOSIS — I129 Hypertensive chronic kidney disease with stage 1 through stage 4 chronic kidney disease, or unspecified chronic kidney disease: Secondary | ICD-10-CM | POA: Diagnosis present

## 2022-07-17 DIAGNOSIS — G8929 Other chronic pain: Secondary | ICD-10-CM | POA: Diagnosis present

## 2022-07-17 LAB — IRON AND TIBC
Iron: 70 ug/dL (ref 28–170)
Saturation Ratios: 20 % (ref 10.4–31.8)
TIBC: 353 ug/dL (ref 250–450)
UIBC: 283 ug/dL

## 2022-07-17 LAB — BASIC METABOLIC PANEL
Anion gap: 7 (ref 5–15)
BUN: 18 mg/dL (ref 8–23)
CO2: 26 mmol/L (ref 22–32)
Calcium: 8.6 mg/dL — ABNORMAL LOW (ref 8.9–10.3)
Chloride: 107 mmol/L (ref 98–111)
Creatinine, Ser: 1.36 mg/dL — ABNORMAL HIGH (ref 0.44–1.00)
GFR, Estimated: 42 mL/min — ABNORMAL LOW (ref >60)
Glucose, Bld: 214 mg/dL — ABNORMAL HIGH (ref 70–99)
Potassium: 3.4 mmol/L — ABNORMAL LOW (ref 3.5–5.1)
Sodium: 140 mmol/L (ref 135–145)

## 2022-07-17 LAB — FERRITIN: Ferritin: 8 ng/mL — ABNORMAL LOW (ref 11–307)

## 2022-07-17 LAB — CBC
HCT: 31.8 % — ABNORMAL LOW (ref 36.0–46.0)
Hemoglobin: 10.5 g/dL — ABNORMAL LOW (ref 12.0–15.0)
MCH: 30.1 pg (ref 26.0–34.0)
MCHC: 33 g/dL (ref 30.0–36.0)
MCV: 91.1 fL (ref 80.0–100.0)
Platelets: 127 10*3/uL — ABNORMAL LOW (ref 150–400)
RBC: 3.49 MIL/uL — ABNORMAL LOW (ref 3.87–5.11)
RDW: 12.9 % (ref 11.5–15.5)
WBC: 3.3 10*3/uL — ABNORMAL LOW (ref 4.0–10.5)
nRBC: 0 % (ref 0.0–0.2)

## 2022-07-17 LAB — COMPREHENSIVE METABOLIC PANEL
ALT: 30 U/L (ref 0–44)
AST: 25 U/L (ref 15–41)
Albumin: 3 g/dL — ABNORMAL LOW (ref 3.5–5.0)
Alkaline Phosphatase: 54 U/L (ref 38–126)
Anion gap: 8 (ref 5–15)
BUN: 19 mg/dL (ref 8–23)
CO2: 24 mmol/L (ref 22–32)
Calcium: 8.7 mg/dL — ABNORMAL LOW (ref 8.9–10.3)
Chloride: 106 mmol/L (ref 98–111)
Creatinine, Ser: 1.25 mg/dL — ABNORMAL HIGH (ref 0.44–1.00)
GFR, Estimated: 47 mL/min — ABNORMAL LOW (ref >60)
Glucose, Bld: 176 mg/dL — ABNORMAL HIGH (ref 70–99)
Potassium: 3.3 mmol/L — ABNORMAL LOW (ref 3.5–5.1)
Sodium: 138 mmol/L (ref 135–145)
Total Bilirubin: 0.8 mg/dL (ref 0.3–1.2)
Total Protein: 6 g/dL — ABNORMAL LOW (ref 6.5–8.1)

## 2022-07-17 LAB — RETICULOCYTES
Immature Retic Fract: 5.8 % (ref 2.3–15.9)
RBC.: 3.55 MIL/uL — ABNORMAL LOW (ref 3.87–5.11)
Retic Count, Absolute: 52.9 10*3/uL (ref 19.0–186.0)
Retic Ct Pct: 1.5 % (ref 0.4–3.1)

## 2022-07-17 LAB — LIPID PANEL
Cholesterol: 125 mg/dL (ref 0–200)
HDL: 78 mg/dL (ref >40)
LDL Cholesterol: 42 mg/dL (ref 0–99)
Total CHOL/HDL Ratio: 1.6 RATIO
Triglycerides: 25 mg/dL (ref <150)
VLDL: 5 mg/dL (ref 0–40)

## 2022-07-17 LAB — HEMOGLOBIN A1C
Hgb A1c MFr Bld: 7 % — ABNORMAL HIGH (ref 4.8–5.6)
Mean Plasma Glucose: 154.2 mg/dL

## 2022-07-17 LAB — GLUCOSE, CAPILLARY
Glucose-Capillary: 169 mg/dL — ABNORMAL HIGH (ref 70–99)
Glucose-Capillary: 100 mg/dL — ABNORMAL HIGH (ref 70–99)
Glucose-Capillary: 100 mg/dL — ABNORMAL HIGH (ref 70–99)
Glucose-Capillary: 165 mg/dL — ABNORMAL HIGH (ref 70–99)

## 2022-07-17 LAB — FOLATE: Folate: 13.8 ng/mL (ref >5.9)

## 2022-07-17 LAB — VITAMIN B12: Vitamin B-12: 667 pg/mL (ref 180–914)

## 2022-07-17 LAB — ECHOCARDIOGRAM COMPLETE
Area-P 1/2: 3.27 cm2
Height: 65 in
S' Lateral: 3 cm
Weight: 3040 oz

## 2022-07-17 LAB — TROPONIN I (HIGH SENSITIVITY)
Troponin I (High Sensitivity): 4 ng/L (ref <18)
Troponin I (High Sensitivity): 5 ng/L (ref <18)
Troponin I (High Sensitivity): 5 ng/L (ref <18)

## 2022-07-17 LAB — MAGNESIUM: Magnesium: 1.7 mg/dL (ref 1.7–2.4)

## 2022-07-17 LAB — PHOSPHORUS: Phosphorus: 3.2 mg/dL (ref 2.5–4.6)

## 2022-07-17 LAB — HIV ANTIBODY (ROUTINE TESTING W REFLEX): HIV Screen 4th Generation wRfx: NONREACTIVE

## 2022-07-17 MED ORDER — POTASSIUM CHLORIDE CRYS ER 20 MEQ PO TBCR
40.0000 meq | EXTENDED_RELEASE_TABLET | Freq: Two times a day (BID) | ORAL | Status: AC
  Administered 2022-07-17 – 2022-07-18 (×3): 40 meq via ORAL
  Filled 2022-07-17 (×3): qty 2

## 2022-07-17 MED ORDER — AMLODIPINE BESYLATE 10 MG PO TABS
10.0000 mg | ORAL_TABLET | Freq: Every day | ORAL | Status: DC
  Administered 2022-07-17 – 2022-07-18 (×2): 10 mg via ORAL
  Filled 2022-07-17 (×2): qty 1

## 2022-07-17 MED ORDER — ISOSORBIDE MONONITRATE ER 30 MG PO TB24
45.0000 mg | ORAL_TABLET | Freq: Every day | ORAL | Status: DC
  Filled 2022-07-17: qty 2

## 2022-07-17 MED ORDER — INSULIN DETEMIR 100 UNIT/ML ~~LOC~~ SOLN
5.0000 [IU] | Freq: Every day | SUBCUTANEOUS | Status: DC
  Administered 2022-07-17: 5 [IU] via SUBCUTANEOUS
  Filled 2022-07-17 (×3): qty 0.05

## 2022-07-17 MED ORDER — INSULIN ASPART 100 UNIT/ML IJ SOLN
0.0000 [IU] | INTRAMUSCULAR | Status: DC
  Administered 2022-07-17: 2 [IU] via SUBCUTANEOUS
  Administered 2022-07-18: 3 [IU] via SUBCUTANEOUS

## 2022-07-17 MED ORDER — ISOSORBIDE MONONITRATE ER 30 MG PO TB24
15.0000 mg | ORAL_TABLET | Freq: Once | ORAL | Status: AC
  Administered 2022-07-17: 15 mg via ORAL

## 2022-07-17 MED ORDER — IRBESARTAN 150 MG PO TABS
300.0000 mg | ORAL_TABLET | Freq: Every day | ORAL | Status: DC
  Administered 2022-07-17: 300 mg via ORAL
  Filled 2022-07-17: qty 2

## 2022-07-17 MED ORDER — NITROGLYCERIN 0.4 MG SL SUBL
SUBLINGUAL_TABLET | SUBLINGUAL | Status: AC
  Administered 2022-07-17: 0.4 mg via SUBLINGUAL
  Filled 2022-07-17: qty 1

## 2022-07-17 NOTE — Consult Note (Addendum)
Cardiology Consultation   Patient ID: Karen Gardner MRN: [id number]; DOB: 28-Jul-1955  Admit date: 07/16/2022 Date of Consult: 07/17/2022  PCP:  [name], MD    [facility] Providers Cardiologist:  Bryan Lemma, MD   {   Patient Profile:   Karen Gardner is a 67 y.o. female with a hx of CAD (not PCI target) with chronic stable angina, HTN, HLD, DM-2, CKD stage III who is being seen 07/17/2022 for the evaluation of chest pain at the request of Dr. Joseph Art.  History of Present Illness:   Karen Gardner has history of chronic recurrent episodes of chest pain that prompted a coronary CT in 2022 that noted a CAC score of 2437 which was in the 99th percentile.  Eventually she underwent a left heart catheterization in November 2022 that showed severe single-vessel disease involving codominant RCA with multiple segments of 70 to 90% stenosis and dfiffuse mild to moderate stenosis throughout the left coronary system-most notably 50% Ramus Intermedius that was being medically managed due to being unsuitable for PCI. Subsequent echocardiogram in December 2022 noted an EF of 55 to 60% no regional wall motion normal abnormalities and grade 1 diastolic dysfunction.  She is followed by Dr. Herbie Baltimore and was last seen in December 2023 with same complaints of chronic stable angina.  Her symptoms at that time were somewhat atypical noting that episodes were occurring mostly in the evening and non exertionally that had occurred for the past 6 months. She has been on Imdur 60 mg in addition to her Ranexa 500 mg twice daily for anginal symptoms.   Yesterday patient  states that she woke up to 8/10 chest pain that was sharp, tight, and radiating to the right side of her back and accompanied with shortness of breath. She took up to 3 nitroglycerin without relief and decided to go to the [facility], ED. Normally, she only requires nitro once every two weeks.   Chest pain appears to be a mix of  typical and atypical symptoms.  Her chest pain has been reported to be pleuritic in nature and reproducible to palpation and worse with movement.  Sometimes these chest pains are nonexertional and sometimes are relieved with rest.  However, they most often present while walking or doing some form of activity.  Currently she still is having minor complaints of chest pain that comes and goes intermittently. Patient also states that she has this right shoulder shooting pain that appears to come and goes abruptly that appears to be spasmic in nature.  This is new and extremely painful for the patient, but patient reports this being entirely different than her chest pain.  She denies any recent illnesses, heart palpitations, dizziness, lightheadedness, peripheral edema.  She has had multiple EKGs done in the ED  that show no evidence of  ST-T wave changes concerning for ischemia.  However she has fluctuated between sinus rhythm and sinus arrhythmia.  High sensitive troponins are negative, 5-4-4. CTA negative for dissection.  CT also noted moderate size sliding type hiatal hernia could be contributing to her symptoms.  Negative chest x-ray.  Potassium 3.3, creatinine 1.25   Past Medical History:  Diagnosis Date   Allergic rhinitis    Anxiety    Arthritis    Asthma    childhood   Chronic low back pain    CKD (chronic kidney disease), stage III    However, most recent creatinine 1.5.   Coronary artery disease involving native heart with other  form of angina pectoris, unspecified vessel or lesion type 11/16/2020   11/16/2020: CORONARY CA++ SCORE: Agatston Score 2245.  LAD 1093, LCx 959, RCA 193 (99th percentile) -> COR CTA (Agatston 2437) -moderate to severe multivessel CAD with significant blooming in all 3 epicardial vessels.  CAD RADS 3-4. FFRCT suggests mid RCA occlusion, => CARDIAC CATH 02/02/21: RCA: prox 70%, Mid-distal 90% & distal 90% (small caliber, Not viable PCI target; Prox RI 50%.   Depression     With anxiety   GERD (gastroesophageal reflux disease)    Headache    sinus headaches    Hypercholesterolemia    Hypertension    Iron deficiency anemia 05/26/2015   Has required transfusions-followed by Dr. Myna Hidalgo   Iron malabsorption 05/26/2015   Menopause    Numbness in both hands    mostly at night   Obesity    Osteoarthritis of both knees    Status post right TKA (and ankle) with plans for left TKA   Type 2 diabetes mellitus without complication, with long-term current use of insulin    Type 2 on insulin pump   Vasovagal syncope 2018   Negative work-up   Vitamin D deficiency     Past Surgical History:  Procedure Laterality Date   COLONOSCOPY     EYE SURGERY Bilateral    [facility]    HERNIA REPAIR     umb hernia as child   KNEE ARTHROSCOPY  02/12/2012   Procedure: ARTHROSCOPY KNEE;  Surgeon: Nestor Lewandowsky, MD;  Location: [facility];  Service: Orthopedics;  Laterality: Right;  Partial Lateral Meniscectomy, Debridement chondromalacia   LAPAROSCOPIC GASTRIC SLEEVE RESECTION N/A 03/14/2016   Procedure: LAPAROSCOPIC GASTRIC SLEEVE RESECTION, WITH REPAIR OF HIATAL HERNIA REPAI, AND UPPER ENDO;  Surgeon: Luretha Murphy, MD;  Location: [facility];  Service: General;  Laterality: N/A;   LEFT HEART CATH AND CORONARY ANGIOGRAPHY N/A 02/02/2021   Procedure: LEFT HEART CATH AND CORONARY ANGIOGRAPHY;  Surgeon: Marykay Lex, MD;  Location: [facility];; Heavily calcified tortuous codominant RCA with proximal 70% stenosis.  Mid to distal RCA 90% stenosis.  Distal RCA 90%.  Not a PCI target.  50% RI, proximal to mid LAD 20%.  Mid to distal LCx 30% with 30% in LP AV-heavily calcified.  EF 55-65%.  Mildly elevated LVEDP.   ORIF ANKLE FRACTURE Right 02/11/2014   Procedure: OPEN REDUCTION INTERNAL FIXATION (ORIF) RIGHT ANKLE FRACTURE;  Surgeon: Nestor Lewandowsky, MD;  Location: [facility];  Service: Orthopedics;  Laterality: Right;   TOTAL KNEE ARTHROPLASTY Right 08/05/2014    Procedure: TOTAL KNEE ARTHROPLASTY;  Surgeon: Gean Birchwood, MD;  Location: [facility];  Service: Orthopedics;  Laterality: Right;   TRANSTHORACIC ECHOCARDIOGRAM  05/2016   EF 60 to 65%.  Severe basal septal LVH with mild concentric LVH.  No R WMA.  GR 1 DD.  Indeterminate filling pressures. Minimal valve disease.   TRANSTHORACIC ECHOCARDIOGRAM  03/29/2021   EF 55 to 60%.  No R WMA-normal strain pattern.  GR 1 DD.  Normal RV with RV P mild aortic sclerosis but no stenosis no AI.  Mildly elevated RAP.   TRIGGER FINGER RELEASE Right 10/14/2018   Procedure: RELEASE TRIGGER FINGER/A-1 PULLEY;  Surgeon: Betha Loa, MD;  Location: [facility];  Service: Orthopedics;  Laterality: Right;   UPPER GI ENDOSCOPY       Inpatient Medications: Scheduled Meds:  aspirin EC  81 mg Oral Daily   atorvastatin  80 mg Oral QHS  carvedilol  3.125 mg Oral BID WC   docusate sodium  100 mg Oral BID   enoxaparin (LOVENOX) injection  40 mg Subcutaneous Q24H   escitalopram  10 mg Oral QHS   insulin aspart  0-15 Units Subcutaneous TID WC   insulin aspart  0-5 Units Subcutaneous QHS   insulin aspart  7 Units Subcutaneous TID WC   isosorbide mononitrate  45 mg Oral Daily   losartan  100 mg Oral QHS   pantoprazole  20 mg Oral Daily   potassium chloride  40 mEq Oral BID   ranolazine  500 mg Oral BID   Continuous Infusions:  PRN Meds: acetaminophen **OR** acetaminophen, albuterol, metoprolol tartrate, nitroGLYCERIN, ondansetron **OR** ondansetron (ZOFRAN) IV, polyethylene glycol, traZODone  Allergies:    Allergies  Allergen Reactions   Codeine Diarrhea   Lactose Intolerance (Gi) Diarrhea and Other (See Comments)    *Gas*    Oatmeal Other (See Comments)    "Gas"     Social History:   Social History   Socioeconomic History   Marital status: Single    Spouse name: Not on file   Number of children: Not on file   Years of education: Not on file   Highest education level: Bachelor's degree (e.g.,  BA, AB, BS)  Occupational History   Occupation: -Futures trader: [employer]    Comment: [employer]  Tobacco Use   Smoking status: Former    Packs/day: 0.25    Years: 1.00    Additional pack years: 0.00    Total pack years: 0.25    Types: Cigarettes    Quit date: 02/08/1972    Years since quitting: 50.4   Smokeless tobacco: Never  Substance and Sexual Activity   Alcohol use: No    Alcohol/week: 0.0 standard drinks of alcohol   Drug use: No   Sexual activity: Not Currently  Other Topics Concern   Not on file  Social History Narrative   She is single, 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  Previously worked as a Education officer, environmental.   She is single, lives alone.  Used to exercise by walking frequently, but with her knee arthritis, not walking as much.   Social Determinants of Health   Financial Resource Strain: Not on file  Food Insecurity: No Food Insecurity (07/16/2022)   Hunger Vital Sign    Worried About Running Out of Food in the Last Year: Never true    Ran Out of Food in the Last Year: Never true  Transportation Needs: No Transportation Needs (07/16/2022)   PRAPARE - Administrator, Civil Service (Medical): No    Lack of Transportation (Non-Medical): No  Physical Activity: Not on file  Stress: Not on file  Social Connections: Not on file  Intimate Partner Violence: Not At Risk (07/16/2022)   Humiliation, Afraid, Rape, and Kick questionnaire    Fear of Current or Ex-Partner: No    Emotionally Abused: No    Physically Abused: No    Sexually Abused: No    Family History:   Family History  Problem Relation Age of Onset   Alcohol abuse Father    Heart attack Sister 76     ROS:  Please see the history of present illness.  All other ROS reviewed and negative.     Physical Exam/Data:   Vitals:   07/17/22 0409 07/17/22 0754 07/17/22 0811 07/17/22 1200  BP: (!) 153/76 (!) 162/87  Marland Kitchen)  162/84  Pulse: 69 62 70  (!) 58  Resp: 16 19  18   Temp: 98 F (36.7 C) 98.7 F (37.1 C)  98.7 F (37.1 C)  TempSrc: Oral Oral  Oral  SpO2: 99% 99% 99% 98%  Weight:      Height:       No intake or output data in the 24 hours ending 07/17/22 1352    07/16/2022   10:07 AM 03/08/2022    8:14 AM 09/08/2021    8:17 AM  Last 3 Weights  Weight (lbs) 190 lb 186 lb 6.4 oz 190 lb 9.6 oz  Weight (kg) 86.183 kg 84.55 kg 86.456 kg     Body mass index is 31.62 kg/m.  General:  Well nourished, well developed, in no acute distress HEENT: normal Neck: no JVD Vascular: No carotid bruits; Distal pulses 2+ bilaterally Cardiac:  normal S1, S2; RRR; no murmur  Lungs:  clear to auscultation bilaterally, no wheezing, rhonchi or rales  Abd: soft, nontender, no hepatomegaly  Ext: no edema Musculoskeletal:  No deformities, BUE and BLE strength normal and equal Skin: warm and dry  Neuro:  CNs 2-12 intact, no focal abnormalities noted Psych:  Normal affect   EKG:  The EKG was personally reviewed and demonstrates: Patient with multiple EKGs noting both sinus arrhythmia and normal sinus rhythm with heart rates in the 60s.  No ST-T wave changes concerning for ischemia. Telemetry:  Telemetry was personally reviewed and demonstrates: Sinus arrhythmia with a heart rate of 65  Relevant CV Studies: Echocardiogram pending  Laboratory Data:  High Sensitivity Troponin:   Recent Labs  Lab 07/16/22 1025 07/16/22 1318 07/16/22 1740 07/17/22 0947 07/17/22 1150  TROPONINIHS 5 5 5 4 5      Chemistry Recent Labs  Lab 07/16/22 1025 07/16/22 1740 07/17/22 0321 07/17/22 0947  NA 138  --  140 138  K 3.3*  --  3.4* 3.3*  CL 105  --  107 106  CO2 25  --  26 24  GLUCOSE 250*  --  214* 176*  BUN 24*  --  18 19  CREATININE 1.15* 1.12* 1.36* 1.25*  CALCIUM 8.9  --  8.6* 8.7*  MG  --   --   --  1.7  GFRNONAA 52* 53* 42* 47*  ANIONGAP 8  --  7 8    Recent Labs  Lab 07/17/22 0947  PROT 6.0*  ALBUMIN 3.0*  AST 25  ALT 30   ALKPHOS 54  BILITOT 0.8   Lipids  Recent Labs  Lab 07/17/22 0947  CHOL 125  TRIG 25  HDL 78  LDLCALC 42  CHOLHDL 1.6    Hematology Recent Labs  Lab 07/16/22 1025 07/16/22 1740 07/17/22 0321 07/17/22 0947  WBC 2.9* 3.2* 3.3*  --   RBC 3.93 3.64* 3.49* 3.55*  HGB 11.5* 10.8* 10.5*  --   HCT 37.1 34.4* 31.8*  --   MCV 94.4 94.5 91.1  --   MCH 29.3 29.7 30.1  --   MCHC 31.0 31.4 33.0  --   RDW 12.6 12.6 12.9  --   PLT 145* 136* 127*  --    Thyroid No results for input(s): "TSH", "FREET4" in the last 168 hours.  BNPNo results for input(s): "BNP", "PROBNP" in the last 168 hours.  DDimer No results for input(s): "DDIMER" in the last 168 hours.   Radiology/Studies:  CT Angio Chest/Abd/Pel for Dissection W and/or Wo Contrast  Result Date: 07/16/2022 CLINICAL DATA:  Acute abdominal  pain. EXAM: CT ANGIOGRAPHY CHEST, ABDOMEN AND PELVIS TECHNIQUE: Non-contrast CT of the chest was initially obtained. Multidetector CT imaging through the chest, abdomen and pelvis was performed using the standard protocol during bolus administration of intravenous contrast. Multiplanar reconstructed images and MIPs were obtained and reviewed to evaluate the vascular anatomy. RADIATION DOSE REDUCTION: This exam was performed according to the departmental dose-optimization program which includes automated exposure control, adjustment of the mA and/or kV according to patient size and/or use of iterative reconstruction technique. CONTRAST:  OMNIPAQUE IOHEXOL 350 MG/ML SOLN COMPARISON:  January 17, 2021.  April 07, 2014.  April 03, 2009. FINDINGS: CTA CHEST FINDINGS Cardiovascular: Preferential opacification of the thoracic aorta. No evidence of thoracic aortic aneurysm or dissection. Normal heart size. No pericardial effusion. Mediastinum/Nodes: Moderate size hiatal hernia is noted with postsurgical changes present. Thyroid gland is unremarkable. No definite adenopathy is noted. Lungs/Pleura: Lungs are  clear. No pleural effusion or pneumothorax. Musculoskeletal: No chest wall abnormality. No acute or significant osseous findings. Review of the MIP images confirms the above findings. CTA ABDOMEN AND PELVIS FINDINGS VASCULAR Aorta: Atherosclerosis of abdominal aorta is noted without aneurysm or dissection. Celiac: Patent without evidence of aneurysm, dissection, vasculitis or significant stenosis. SMA: Patent without evidence of aneurysm, dissection, vasculitis or significant stenosis. Renals: Both renal arteries are patent without evidence of aneurysm, dissection, vasculitis, fibromuscular dysplasia or significant stenosis. IMA: Patent without evidence of aneurysm, dissection, vasculitis or significant stenosis. Inflow: Patent without evidence of aneurysm, dissection, vasculitis or significant stenosis. Veins: No obvious venous abnormality within the limitations of this arterial phase study. Review of the MIP images confirms the above findings. NON-VASCULAR Hepatobiliary: No focal liver abnormality is seen. No gallstones, gallbladder wall thickening, or biliary dilatation. Pancreas: Unremarkable. No pancreatic ductal dilatation or surrounding inflammatory changes. Spleen: Normal in size without focal abnormality. Adrenals/Urinary Tract: Adrenal glands are unremarkable. Kidneys are normal, without renal calculi, focal lesion, or hydronephrosis. Bladder is unremarkable. Stomach/Bowel: There is no evidence of bowel obstruction or inflammation. The appendix appears normal. Lymphatic: No adenopathy is noted. Reproductive: Calcified uterine fibroid is noted. No adnexal abnormality. Other: No abdominal wall hernia or abnormality. No abdominopelvic ascites. Musculoskeletal: No acute or significant osseous findings. Review of the MIP images confirms the above findings. IMPRESSION: No evidence of thoracic or abdominal aortic dissection or aneurysm. Moderate size sliding-type hiatal hernia. Calcified uterine fibroid. Aortic  Atherosclerosis (ICD10-I70.0). Electronically Signed   By: Lupita Raider M.D.   On: 07/16/2022 13:41   DG Chest 2 View  Result Date: 07/16/2022 CLINICAL DATA:  Central chest pain EXAM: CHEST - 2 VIEW COMPARISON:  Chest x-ray dated 01/12/2016 FINDINGS: Heart size and mediastinal contours are within normal limits. Lungs are clear. No pleural effusion or pneumothorax is seen. No acute-appearing osseous abnormality. IMPRESSION: No active cardiopulmonary disease. No evidence of pneumonia or pulmonary edema. Electronically Signed   By: Bary Richard M.D.   On: 07/16/2022 10:52     Assessment and Plan:   Atypical chest pain CAD with moderate to severe codominant disease with RCA and LCA History of chronic stable angina Patient presenting with atypical features of chest pain not responsive to 3 doses of nitroglycerin.  EKG without any signs of ischemic changes.  Troponins negative 5-5-4. She has known history of coronary artery disease involving codominant RCA with multiple segments of 70 to 90% stenosis with plans to treat medically.  Still currently complaining of chest pain however has improved since admission.  Plan to treat medically and  defer cath for now. Her coronary anatomy is not conducive for intervention. Additionally, considering her symptoms are in the presence of uncontrolled BP will manage that with hopes of improving her anginal symptoms.    Currently on Imdur 45 mg daily however previous notes however at 60 mg and Ranexa 500 mg twice daily Continue aspirin 81 mg and atorvastatin 80 mg Echocardiogram pending For future cardiac catheterizations: Prior notes recommended femoral access due to difficult manipulation of catheters and arterial spasm  Hypertension Readings from this admission indicate uncontrolled blood pressure that is fluctuated between 150-190 systolic. Starting amlodipine  daily Currently on carvedilol 3.125 mg twice daily this is a reduced dose due to prior symptoms  of fatigue Continue losartan 100 mg daily. Consider switching to telmisartan. Will defer changes until echocardiogram results have been read.  Hyperlipidemia Continue atorvastatin 80 mg.  LDL 42  CKD stage 3a Creatinine 1.25 which appears to be around baseline of 1.2-1.4.  47  Hypokalemia Currently 3.3. and being supplemented with potassium 40 mcg tablets twice daily  Type 2 DM SSI per primary team   Right shoulder pain Patient with new onset of right shoulder pain that appears to be spasmic in nature.  Could be contributing to chest pain symptoms.. Defer to primary team    Risk Assessment/Risk Scores:   TIMI Risk Score for Unstable Angina or Non-ST Elevation MI:   The patient's TIMI risk score is 5, which indicates a 26% risk of all cause mortality, new or recurrent myocardial infarction or need for urgent revascularization in the next 14 days.{   For questions or updates, please contact [facility] Please consult [email or web address] for contact info under    Signed, Abagail Kitchens, PA-C  07/17/2022 1:52 PM   ATTENDING ATTESTATION:  After conducting a review of all available clinical information with the care team, interviewing the patient, and performing a physical exam, I agree with the findings and plan described in this note.   GEN: No acute distress.   HEENT:  MMM, no JVD, no scleral icterus Cardiac: RRR, no murmurs, rubs, or gallops.  Respiratory: Clear to auscultation bilaterally. GI: Soft, nontender, non-distended  MS: No edema; No deformity. Neuro:  Nonfocal  Vasc:  +2 radial pulses   The patient is 67 year old African-American female with a history of coronary artery disease with a small caliber very tortuous right coronary artery with serial lesions and a distal CTO, on medical management for chronic stable angina, hypertension, hyperlipidemia, type 2 diabetes, and chronic kidney disease stage III who was admitted with hypertensive urgency.  The patient  presented to the emergency department with chest pain at rest and with exertion.  Her blood pressure was noted to be close to 200 mmHg.  Her troponins have been negative x 2.  Her EKG showed no ischemic changes.  She is currently chest pain-free with a blood pressure in the 150s.  An echocardiogram was done but is pending.  On my interview today the patient is currently chest pain-free.  She tells me she developed chest pain while she was sleeping.  She has occasionally gotten chest pain at rest and with exertion which is different than before.  She tells me her blood pressures are sometimes well-controlled and oftentimes in the 150s to 160s at home.  She is completely compliant with her medications.  I reviewed her coronary angiography from study from 2022 which demonstrates moderate disease of the left system and moderate to severe disease particularly of the distal right  coronary artery that looks almost subtotally occluded.  This is a very tortuous vessel.  I think the best treatment for this patient at this time is improved blood pressure control.  Will transition her from from losartan to irbesartan and start amlodipine 10 mg daily.  Hiatal hernia was also noted on her CT scan which may be contributing to her symptomatology.  Alverda Skeans, MD Pager [phone]

## 2022-07-17 NOTE — Care Management (Signed)
  Transition of Care [facility]) Screening Note   Patient Details  Name: Karen Gardner Date of Birth: 03-13-1956   Transition of Care [facility]) CM/SW Contact:    Gala Lewandowsky, RN Phone Number: 07/17/2022, 4:34 PM    Transition of Care Department [facility]) has reviewed the patient and no TOC needs have been identified at this time. Patient presented for chest pain. PTA patient was independent from home. Patient has insurance and PCP.  We will continue to monitor patient advancement through interdisciplinary progression rounds. If new patient transition needs arise, please place a TOC consult.

## 2022-07-17 NOTE — Progress Notes (Signed)
  Echocardiogram 2D Echocardiogram has been performed.  Karen Gardner 07/17/2022, 3:13 PM

## 2022-07-17 NOTE — Care Management Obs Status (Signed)
MEDICARE OBSERVATION STATUS NOTIFICATION   Patient Details  Name: Karen Gardner MRN: [id number] Date of Birth: [birth year]   Medicare Observation Status Notification Given:  Yes    Gala Lewandowsky, RN 07/17/2022, 4:34 PM

## 2022-07-17 NOTE — Progress Notes (Signed)
PROGRESS NOTE    Karen Gardner  YQM:[id number] DOB: 06-04-1955 DOA: 07/16/2022 PCP: [name], MD     Brief Narrative:  67 y.o. BF PMHx anxiety, diabetes type 2 controlled with hyperglycemia, GERD, CAD, chronic atypical anginal symptoms   Admitted to the hospital for observation with complaints of chest pain.  Patient states she does have a history of intermittent chest pressure and discomfort not associated with activity.  However in the last 24 hours, she has been having severe sharp stabbing in the center of her chest radiating to the right side of the chest.  She denies any overlying rash, there is no association with activity.  It hurts more with deep breaths, moves her right arm.  She has a cough but this is chronic for her, no fevers, no chills, no nausea, vomiting, etc.    Subjective: A/O x 4, negative SOB PE, positive chest pain described as pressure, negative radiation of CP, negative diaphoresis   Assessment & Plan: Covid vaccination;   Principal Problem:   Atypical chest pain Active Problems:   Hyperlipidemia associated with type 2 diabetes mellitus   DM2 (diabetes mellitus, type 2)   Iron deficiency anemia   Coronary artery disease involving native coronary artery of native heart with angina pectoris   Chronic stable angina  CAD with moderate to severe codominant disease with RCA and LCA  -Per cardiology -History of coronary artery disease involving codominant RCA with multiple segments of 70 to 90% stenosis with plans to treat medically  -treat medically and defer cath for now. Her coronary anatomy is not conducive for intervention  -Believe symptoms secondary to uncontrolled BP  Atypical chest pain -TIMI risk score is 5, which indicates a 26% risk of all cause mortality,  - Musculoskeletal chest pain has refused NSAID pain medication - Atypical anginal symptoms? - Trend troponin - Echocardiogram pending - 4/15 EKG NSR with PAC -  Hx chronic  stable angina -Patient with significant history of angina -Patient seen by Dr. Bryan Lemma cardiology - Per Dr. Bryan Lemma progress Notes patient has had issues of syncope in the past.  Essential HTN -Amlodipine 10 mg daily - Coreg 3.125 mg BID - Irbesartan 300 mg daily -4/15 increase Imdur 45 mg daily -Ranexa 500 mg BID  Diabetes type 2 controlled with hyperglycemia - 4/14 hemoglobin A1c= 7.0 -4/15 Levemir 5 units daily -NovoLog 7 units qac - 4/15 decrease to Sensitive SSI CBG (last 3)  Recent Labs    07/17/22 0749 07/17/22 1158 07/17/22 1605  GLUCAP 169* 100* 100*     HLD - 4/15 lipid panel pending  Iron deficiency anemia? -Transfuse for hemoglobin<7 - Anemia panel pending - Occult blood pending Lab Results  Component Value Date   HGB 10.5 (L) 07/17/2022   HGB 10.8 (L) 07/16/2022   HGB 11.5 (L) 07/16/2022   HGB 12.3 01/27/2021   HGB 12.4 03/21/2020   Hypokalemia - Potassium goal> 4 -4/15 K-Dur 40 mEq x 3 doses  Obesity (BMI 31.62 kg/m)         Mobility Assessment (last 72 hours)     Mobility Assessment     Row Name 07/16/22 2100           Does patient have an order for bedrest or is patient medically unstable No - Continue assessment       What is the highest level of mobility based on the progressive mobility assessment? Level 6 (Walks independently in room and hall) - Balance while walking in  room without assist - Complete                      DVT prophylaxis: Lovenox Code Status: Full Family Communication:  Status is: Inpatient    Dispo: The patient is from: Home              Anticipated d/c is to: Home              Anticipated d/c date is: 2 days              Patient currently is not medically stable to d/c.      Consultants:  Cardiology  Procedures/Significant Events:    I have personally reviewed and interpreted all radiology studies and my findings are as above.  VENTILATOR  SETTINGS:    Cultures   Antimicrobials:    Devices    LINES / TUBES:      Continuous Infusions:   Objective: Vitals:   07/17/22 0007 07/17/22 0409 07/17/22 0754 07/17/22 0811  BP: 133/71 (!) 153/76 (!) 162/87   Pulse: 68 69 62 70  Resp: Temp: 98.1 F (36.7 C) 98 F (36.7 C) 98.7 F (37.1 C)   TempSrc: Oral Oral Oral   SpO2: 98% 99% 99% 99%  Weight:      Height:       No intake or output data in the 24 hours ending 07/17/22 0818 Filed Weights   07/16/22 1007  Weight: 86.2 kg    Examination:  General: A/O x 4 No acute respiratory distress Eyes: negative scleral hemorrhage, negative anisocoria, negative icterus ENT: Negative Runny nose, negative gingival bleeding, Neck:  Negative scars, masses, torticollis, lymphadenopathy, JVD Lungs: Clear to auscultation bilaterally without wheezes or crackles Cardiovascular: Regular rate and rhythm without murmur gallop or rub normal S1 and S2 Abdomen: negative abdominal pain, nondistended, positive soft, bowel sounds, no rebound, no ascites, no appreciable mass Extremities: No significant cyanosis, clubbing, or edema bilateral lower extremities Skin: Negative rashes, lesions, ulcers Psychiatric:  Negative depression, negative anxiety, negative fatigue, negative mania  Central nervous system:  Cranial nerves II through XII intact, tongue/uvula midline, all extremities muscle strength 5/5, sensation intact throughout, negative dysarthria, negative expressive aphasia, negative receptive aphasia.  .     Data Reviewed: Care during the described time interval was provided by me .  I have reviewed this patient's available data, including medical history, events of note, physical examination, and all test results as part of my evaluation.  CBC: Recent Labs  Lab 07/16/22 1025 07/16/22 1740 07/17/22 0321  WBC 2.9* 3.2* 3.3*  HGB 11.5* 10.8* 10.5*  HCT 37.1 34.4* 31.8*  MCV 94.4 94.5 91.1  PLT 145* 136* 127*    Basic Metabolic Panel: Recent Labs  Lab 07/16/22 1025 07/16/22 1740 07/17/22 0321  NA 138  --  140  K 3.3*  --  3.4*  CL 105  --  107  CO2 25  --  26  GLUCOSE 250*  --  214*  BUN 24*  --  18  CREATININE 1.15* 1.12* 1.36*  CALCIUM 8.9  --  8.6*   GFR: Estimated Creatinine Clearance: 42.3 mL/min (A) (by C-G formula based on SCr of 1.36 mg/dL (H)). Liver Function Tests: No results for input(s): "AST", "ALT", "ALKPHOS", "BILITOT", "PROT", "ALBUMIN" in the last 168 hours. No results for input(s): "LIPASE", "AMYLASE" in the last 168 hours. No results for input(s): "AMMONIA" in the last 168 hours. Coagulation Profile: No  results for input(s): "INR", "PROTIME" in the last 168 hours. Cardiac Enzymes: No results for input(s): "CKTOTAL", "CKMB", "CKMBINDEX", "TROPONINI" in the last 168 hours. BNP (last 3 results) No results for input(s): "PROBNP" in the last 8760 hours. HbA1C: Recent Labs    07/16/22 1740  HGBA1C 7.0*   CBG: Recent Labs  Lab 07/16/22 1725 07/16/22 2109 07/17/22 0749  GLUCAP 192* 187* 169*   Lipid Profile: No results for input(s): "CHOL", "HDL", "LDLCALC", "TRIG", "CHOLHDL", "LDLDIRECT" in the last 72 hours. Thyroid Function Tests: No results for input(s): "TSH", "T4TOTAL", "FREET4", "T3FREE", "THYROIDAB" in the last 72 hours. Anemia Panel: No results for input(s): "VITAMINB12", "FOLATE", "FERRITIN", "TIBC", "IRON", "RETICCTPCT" in the last 72 hours. Sepsis Labs: No results for input(s): "PROCALCITON", "LATICACIDVEN" in the last 168 hours.  No results found for this or any previous visit (from the past 240 hour(s)).       Radiology Studies: CT Angio Chest/Abd/Pel for Dissection W and/or Wo Contrast  Result Date: 07/16/2022 CLINICAL DATA:  Acute abdominal pain. EXAM: CT ANGIOGRAPHY CHEST, ABDOMEN AND PELVIS TECHNIQUE: Non-contrast CT of the chest was initially obtained. Multidetector CT imaging through the chest, abdomen and pelvis was performed  using the standard protocol during bolus administration of intravenous contrast. Multiplanar reconstructed images and MIPs were obtained and reviewed to evaluate the vascular anatomy. RADIATION DOSE REDUCTION: This exam was performed according to the departmental dose-optimization program which includes automated exposure control, adjustment of the mA and/or kV according to patient size and/or use of iterative reconstruction technique. CONTRAST:  OMNIPAQUE IOHEXOL 350 MG/ML SOLN COMPARISON:  January 17, 2021.  April 07, 2014.  April 03, 2009. FINDINGS: CTA CHEST FINDINGS Cardiovascular: Preferential opacification of the thoracic aorta. No evidence of thoracic aortic aneurysm or dissection. Normal heart size. No pericardial effusion. Mediastinum/Nodes: Moderate size hiatal hernia is noted with postsurgical changes present. Thyroid gland is unremarkable. No definite adenopathy is noted. Lungs/Pleura: Lungs are clear. No pleural effusion or pneumothorax. Musculoskeletal: No chest wall abnormality. No acute or significant osseous findings. Review of the MIP images confirms the above findings. CTA ABDOMEN AND PELVIS FINDINGS VASCULAR Aorta: Atherosclerosis of abdominal aorta is noted without aneurysm or dissection. Celiac: Patent without evidence of aneurysm, dissection, vasculitis or significant stenosis. SMA: Patent without evidence of aneurysm, dissection, vasculitis or significant stenosis. Renals: Both renal arteries are patent without evidence of aneurysm, dissection, vasculitis, fibromuscular dysplasia or significant stenosis. IMA: Patent without evidence of aneurysm, dissection, vasculitis or significant stenosis. Inflow: Patent without evidence of aneurysm, dissection, vasculitis or significant stenosis. Veins: No obvious venous abnormality within the limitations of this arterial phase study. Review of the MIP images confirms the above findings. NON-VASCULAR Hepatobiliary: No focal liver abnormality is  seen. No gallstones, gallbladder wall thickening, or biliary dilatation. Pancreas: Unremarkable. No pancreatic ductal dilatation or surrounding inflammatory changes. Spleen: Normal in size without focal abnormality. Adrenals/Urinary Tract: Adrenal glands are unremarkable. Kidneys are normal, without renal calculi, focal lesion, or hydronephrosis. Bladder is unremarkable. Stomach/Bowel: There is no evidence of bowel obstruction or inflammation. The appendix appears normal. Lymphatic: No adenopathy is noted. Reproductive: Calcified uterine fibroid is noted. No adnexal abnormality. Other: No abdominal wall hernia or abnormality. No abdominopelvic ascites. Musculoskeletal: No acute or significant osseous findings. Review of the MIP images confirms the above findings. IMPRESSION: No evidence of thoracic or abdominal aortic dissection or aneurysm. Moderate size sliding-type hiatal hernia. Calcified uterine fibroid. Aortic Atherosclerosis (ICD10-I70.0). Electronically Signed   By: Lupita Raider M.D.   On: 07/16/2022  13:41   DG Chest 2 View  Result Date: 07/16/2022 CLINICAL DATA:  Central chest pain EXAM: CHEST - 2 VIEW COMPARISON:  Chest x-ray dated 01/12/2016 FINDINGS: Heart size and mediastinal contours are within normal limits. Lungs are clear. No pleural effusion or pneumothorax is seen. No acute-appearing osseous abnormality. IMPRESSION: No active cardiopulmonary disease. No evidence of pneumonia or pulmonary edema. Electronically Signed   By: Bary Richard M.D.   On: 07/16/2022 10:52        Scheduled Meds:  aspirin EC  81 mg Oral Daily   atorvastatin  80 mg Oral QHS   carvedilol  3.125 mg Oral BID WC   docusate sodium  100 mg Oral BID   enoxaparin (LOVENOX) injection  40 mg Subcutaneous Q24H   escitalopram  10 mg Oral QHS   insulin aspart  0-15 Units Subcutaneous TID WC   insulin aspart  0-5 Units Subcutaneous QHS   insulin aspart  7 Units Subcutaneous TID WC   isosorbide mononitrate  30 mg Oral  Daily   losartan  100 mg Oral QHS   pantoprazole  20 mg Oral Daily   ranolazine  500 mg Oral BID   Continuous Infusions:   LOS: 0 days    Time spent:40 min    Destin Kittler, Roselind Messier, MD [facility]   If 7PM-7AM, please contact night-coverage 07/17/2022, 8:18 AM 4/15 increase

## 2022-07-18 ENCOUNTER — Encounter: Payer: Self-pay | Admitting: Hematology & Oncology

## 2022-07-18 ENCOUNTER — Other Ambulatory Visit (HOSPITAL_COMMUNITY): Payer: Self-pay

## 2022-07-18 DIAGNOSIS — E1169 Type 2 diabetes mellitus with other specified complication: Secondary | ICD-10-CM | POA: Diagnosis not present

## 2022-07-18 DIAGNOSIS — E119 Type 2 diabetes mellitus without complications: Secondary | ICD-10-CM

## 2022-07-18 DIAGNOSIS — Z794 Long term (current) use of insulin: Secondary | ICD-10-CM

## 2022-07-18 DIAGNOSIS — R0789 Other chest pain: Secondary | ICD-10-CM | POA: Diagnosis not present

## 2022-07-18 DIAGNOSIS — I25119 Atherosclerotic heart disease of native coronary artery with unspecified angina pectoris: Secondary | ICD-10-CM | POA: Diagnosis not present

## 2022-07-18 DIAGNOSIS — R079 Chest pain, unspecified: Secondary | ICD-10-CM | POA: Diagnosis not present

## 2022-07-18 LAB — FERRITIN: Ferritin: 8 ng/mL — ABNORMAL LOW (ref 11–307)

## 2022-07-18 LAB — RETICULOCYTES
Immature Retic Fract: 11.9 % (ref 2.3–15.9)
RBC.: 3.8 MIL/uL — ABNORMAL LOW (ref 3.87–5.11)
Retic Count, Absolute: 65.7 10*3/uL (ref 19.0–186.0)
Retic Ct Pct: 1.7 % (ref 0.4–3.1)

## 2022-07-18 LAB — CBC WITH DIFFERENTIAL/PLATELET
Abs Immature Granulocytes: 0.01 10*3/uL (ref 0.00–0.07)
Basophils Absolute: 0 10*3/uL (ref 0.0–0.1)
Basophils Relative: 1 %
Eosinophils Absolute: 0.1 10*3/uL (ref 0.0–0.5)
Eosinophils Relative: 4 %
HCT: 35.3 % — ABNORMAL LOW (ref 36.0–46.0)
Hemoglobin: 11.6 g/dL — ABNORMAL LOW (ref 12.0–15.0)
Immature Granulocytes: 0 %
Lymphocytes Relative: 50 %
Lymphs Abs: 1.9 10*3/uL (ref 0.7–4.0)
MCH: 30.1 pg (ref 26.0–34.0)
MCHC: 32.9 g/dL (ref 30.0–36.0)
MCV: 91.5 fL (ref 80.0–100.0)
Monocytes Absolute: 0.5 10*3/uL (ref 0.1–1.0)
Monocytes Relative: 14 %
Neutro Abs: 1.2 10*3/uL — ABNORMAL LOW (ref 1.7–7.7)
Neutrophils Relative %: 31 %
Platelets: 146 10*3/uL — ABNORMAL LOW (ref 150–400)
RBC: 3.86 MIL/uL — ABNORMAL LOW (ref 3.87–5.11)
RDW: 12.8 % (ref 11.5–15.5)
WBC: 3.7 10*3/uL — ABNORMAL LOW (ref 4.0–10.5)
nRBC: 0 % (ref 0.0–0.2)

## 2022-07-18 LAB — IRON AND TIBC
Iron: 45 ug/dL (ref 28–170)
Saturation Ratios: 12 % (ref 10.4–31.8)
TIBC: 374 ug/dL (ref 250–450)
UIBC: 329 ug/dL

## 2022-07-18 LAB — BASIC METABOLIC PANEL
Anion gap: 6 (ref 5–15)
BUN: 19 mg/dL (ref 8–23)
CO2: 27 mmol/L (ref 22–32)
Calcium: 9 mg/dL (ref 8.9–10.3)
Chloride: 106 mmol/L (ref 98–111)
Creatinine, Ser: 1.23 mg/dL — ABNORMAL HIGH (ref 0.44–1.00)
GFR, Estimated: 48 mL/min — ABNORMAL LOW (ref >60)
Glucose, Bld: 113 mg/dL — ABNORMAL HIGH (ref 70–99)
Potassium: 3.8 mmol/L (ref 3.5–5.1)
Sodium: 139 mmol/L (ref 135–145)

## 2022-07-18 LAB — GLUCOSE, CAPILLARY
Glucose-Capillary: 109 mg/dL — ABNORMAL HIGH (ref 70–99)
Glucose-Capillary: 108 mg/dL — ABNORMAL HIGH (ref 70–99)
Glucose-Capillary: 229 mg/dL — ABNORMAL HIGH (ref 70–99)

## 2022-07-18 LAB — VITAMIN B12: Vitamin B-12: 699 pg/mL (ref 180–914)

## 2022-07-18 LAB — FOLATE: Folate: 14 ng/mL (ref >5.9)

## 2022-07-18 MED ORDER — ISOSORBIDE MONONITRATE ER 60 MG PO TB24
60.0000 mg | ORAL_TABLET | Freq: Every day | ORAL | Status: DC
  Administered 2022-07-18: 60 mg via ORAL
  Filled 2022-07-18: qty 1

## 2022-07-18 MED ORDER — CARVEDILOL 6.25 MG PO TABS
6.2500 mg | ORAL_TABLET | Freq: Two times a day (BID) | ORAL | Status: DC
  Administered 2022-07-18: 6.25 mg via ORAL
  Filled 2022-07-18: qty 1

## 2022-07-18 MED ORDER — TRAZODONE HCL 50 MG PO TABS
25.0000 mg | ORAL_TABLET | Freq: Every evening | ORAL | 0 refills | Status: AC | PRN
  Filled 2022-07-18: qty 15, 30d supply, fill #0

## 2022-07-18 MED ORDER — ISOSORBIDE MONONITRATE ER 60 MG PO TB24
60.0000 mg | ORAL_TABLET | Freq: Every day | ORAL | 0 refills | Status: DC
  Filled 2022-07-18: qty 30, 30d supply, fill #0

## 2022-07-18 MED ORDER — AMLODIPINE BESYLATE 10 MG PO TABS
10.0000 mg | ORAL_TABLET | Freq: Every day | ORAL | 0 refills | Status: AC
  Filled 2022-07-18: qty 30, 30d supply, fill #0

## 2022-07-18 MED ORDER — ONDANSETRON HCL 4 MG PO TABS
4.0000 mg | ORAL_TABLET | Freq: Four times a day (QID) | ORAL | 0 refills | Status: AC | PRN
  Filled 2022-07-18: qty 20, 5d supply, fill #0

## 2022-07-18 MED ORDER — IRBESARTAN 300 MG PO TABS
300.0000 mg | ORAL_TABLET | Freq: Every day | ORAL | 0 refills | Status: DC
  Filled 2022-07-18: qty 30, 30d supply, fill #0

## 2022-07-18 MED ORDER — RANOLAZINE ER 500 MG PO TB12
500.0000 mg | ORAL_TABLET | Freq: Two times a day (BID) | ORAL | 0 refills | Status: DC
  Filled 2022-07-18: qty 60, 30d supply, fill #0

## 2022-07-18 NOTE — Progress Notes (Addendum)
Rounding Note    Patient Name: Karen Gardner Date of Encounter: 07/18/2022  [facility] Cardiologist: Bryan Lemma, MD   Subjective   Had one episode of chest pain this morning, reports pain with inspiration and palpation.   Inpatient Medications    Scheduled Meds:  amLODipine  10 mg Oral Daily   aspirin EC  81 mg Oral Daily   atorvastatin  80 mg Oral QHS   carvedilol  3.125 mg Oral BID WC   docusate sodium  100 mg Oral BID   enoxaparin (LOVENOX) injection  40 mg Subcutaneous Q24H   escitalopram  10 mg Oral QHS   insulin aspart  0-9 Units Subcutaneous Q4H   insulin aspart  7 Units Subcutaneous TID WC   insulin detemir  5 Units Subcutaneous QHS   irbesartan  300 mg Oral QHS   isosorbide mononitrate  45 mg Oral Daily   pantoprazole  20 mg Oral Daily   potassium chloride  40 mEq Oral BID   ranolazine  500 mg Oral BID   Continuous Infusions:  PRN Meds: acetaminophen **OR** acetaminophen, albuterol, metoprolol tartrate, nitroGLYCERIN, ondansetron **OR** ondansetron (ZOFRAN) IV, polyethylene glycol, traZODone   Vital Signs    Vitals:   07/17/22 0811 07/17/22 1200 07/17/22 1607 07/17/22 2037  BP:  (!) 162/84 (!) 168/85 (!) 161/85  Pulse: 70 (!) 58 68 76  Resp:  18 18 20   Temp:  98.7 F (37.1 C) 98.5 F (36.9 C) 98.2 F (36.8 C)  TempSrc:  Oral Oral Oral  SpO2: 99% 98% 99% 99%  Weight:      Height:       No intake or output data in the 24 hours ending 07/18/22 0902    07/16/2022   10:07 AM 03/08/2022    8:14 AM 09/08/2021    8:17 AM  Last 3 Weights  Weight (lbs) 190 lb 186 lb 6.4 oz 190 lb 9.6 oz  Weight (kg) 86.183 kg 84.55 kg 86.456 kg      Telemetry    Sinus Rhythm - Personally Reviewed  ECG    No new tracing  Physical Exam   GEN: No acute distress.   Neck: No JVD Cardiac: RRR, no murmurs, rubs, or gallops.  Respiratory: Clear to auscultation bilaterally. GI: Soft, nontender, non-distended  MS: No edema; No deformity. Neuro:   Nonfocal  Psych: Normal affect   Labs    High Sensitivity Troponin:   Recent Labs  Lab 07/16/22 1318 07/16/22 1740 07/17/22 0947 07/17/22 1150 07/17/22 1342  TROPONINIHS 5 5 4 5 5      Chemistry Recent Labs  Lab 07/17/22 0321 07/17/22 0947 07/18/22 0206  NA 140 138 139  K 3.4* 3.3* 3.8  CL 107 106 106  CO2 26 24 27   GLUCOSE 214* 176* 113*  BUN 18 19 19   CREATININE 1.36* 1.25* 1.23*  CALCIUM 8.6* 8.7* 9.0  MG  --  1.7  --   PROT  --  6.0*  --   ALBUMIN  --  3.0*  --   AST  --  25  --   ALT  --  30  --   ALKPHOS  --  54  --   BILITOT  --  0.8  --   GFRNONAA 42* 47* 48*  ANIONGAP 7 8 6     Lipids  Recent Labs  Lab 07/17/22 0947  CHOL 125  TRIG 25  HDL 78  LDLCALC 42  CHOLHDL 1.6    Hematology Recent  Labs  Lab 07/16/22 1740 07/17/22 0321 07/17/22 0947 07/18/22 0206  WBC 3.2* 3.3*  --  3.7*  RBC 3.64* 3.49* 3.55* 3.86*  3.80*  HGB 10.8* 10.5*  --  11.6*  HCT 34.4* 31.8*  --  35.3*  MCV 94.5 91.1  --  91.5  MCH 29.7 30.1  --  30.1  MCHC 31.4 33.0  --  32.9  RDW 12.6 12.9  --  12.8  PLT 136* 127*  --  146*   Thyroid No results for input(s): "TSH", "FREET4" in the last 168 hours.  BNPNo results for input(s): "BNP", "PROBNP" in the last 168 hours.  DDimer No results for input(s): "DDIMER" in the last 168 hours.   Radiology    ECHOCARDIOGRAM COMPLETE  Result Date: 07/17/2022    ECHOCARDIOGRAM REPORT   Patient Name:   Karen Gardner Date of Exam: 07/17/2022 Medical Rec #:  [id number]        Height:       65.0 in Accession #:    [id number]       Weight:       190.0 lb Date of Birth:  25-Jan-1956        BSA:          1.935 m Patient Age:    67 years         BP:           162/84 mmHg Patient Gender: F                HR:           68 bpm. Exam Location:  Inpatient Procedure: 2D Echo, Cardiac Doppler and Color Doppler Indications:    chest pain  History:        Patient has prior history of Echocardiogram examinations, most                 recent 03/29/2021.  CAD; Risk Factors:Hypertension, Dyslipidemia                 and Diabetes.  Sonographer:    Delcie Roch RDCS Referring Phys: [id number] CURTIS J WOODS IMPRESSIONS  1. Left ventricular ejection fraction, by estimation, is 55 to 60%. The left ventricle has normal function. The left ventricle has no regional wall motion abnormalities. There is mild concentric left ventricular hypertrophy. Left ventricular diastolic parameters are indeterminate.  2. Right ventricular systolic function is normal. The right ventricular size is normal. There is normal pulmonary artery systolic pressure.  3. The mitral valve is normal in structure. Mild mitral valve regurgitation. No evidence of mitral stenosis.  4. The aortic valve is normal in structure. Aortic valve regurgitation is not visualized. Aortic valve sclerosis/calcification is present, without any evidence of aortic stenosis.  5. The inferior vena cava is normal in size with greater than 50% respiratory variability, suggesting right atrial pressure of 3 mmHg. FINDINGS  Left Ventricle: Left ventricular ejection fraction, by estimation, is 55 to 60%. The left ventricle has normal function. The left ventricle has no regional wall motion abnormalities. The left ventricular internal cavity size was normal in size. There is  mild concentric left ventricular hypertrophy. Left ventricular diastolic parameters are indeterminate. Right Ventricle: The right ventricular size is normal. No increase in right ventricular wall thickness. Right ventricular systolic function is normal. There is normal pulmonary artery systolic pressure. The tricuspid regurgitant velocity is 2.52 m/s, and  with an assumed right atrial pressure of 3 mmHg, the estimated right ventricular systolic pressure  is 28.4 mmHg. Left Atrium: Left atrial size was normal in size. Right Atrium: Right atrial size was normal in size. Pericardium: Trivial pericardial effusion is present. The pericardial effusion is  circumferential. Mitral Valve: The mitral valve is normal in structure. Mild mitral valve regurgitation. No evidence of mitral valve stenosis. Tricuspid Valve: The tricuspid valve is normal in structure. Tricuspid valve regurgitation is mild . No evidence of tricuspid stenosis. Aortic Valve: The aortic valve is normal in structure. Aortic valve regurgitation is not visualized. Aortic valve sclerosis/calcification is present, without any evidence of aortic stenosis. Pulmonic Valve: The pulmonic valve was normal in structure. Pulmonic valve regurgitation is trivial. No evidence of pulmonic stenosis. Aorta: The aortic root is normal in size and structure. Venous: The inferior vena cava is normal in size with greater than 50% respiratory variability, suggesting right atrial pressure of 3 mmHg. IAS/Shunts: No atrial level shunt detected by color flow Doppler.  LEFT VENTRICLE PLAX 2D LVIDd:         4.40 cm   Diastology LVIDs:         3.00 cm   LV e' medial:    5.44 cm/s LV PW:         0.90 cm   LV E/e' medial:  16.3 LV IVS:        1.20 cm   LV e' lateral:   8.59 cm/s LVOT diam:     1.90 cm   LV E/e' lateral: 10.3 LV SV:         80 LV SV Index:   41 LVOT Area:     2.84 cm  RIGHT VENTRICLE             IVC RV Basal diam:  2.80 cm     IVC diam: 2.00 cm RV S prime:     12.40 cm/s TAPSE (M-mode): 1.9 cm LEFT ATRIUM             Index        RIGHT ATRIUM           Index LA diam:        3.90 cm 2.02 cm/m   RA Area:     13.90 cm LA Vol (A2C):   43.5 ml 22.48 ml/m  RA Volume:   34.00 ml  17.57 ml/m LA Vol (A4C):   46.3 ml 23.92 ml/m LA Biplane Vol: 47.8 ml 24.70 ml/m  AORTIC VALVE LVOT Vmax:   116.00 cm/s LVOT Vmean:  77.100 cm/s LVOT VTI:    0.283 m  AORTA Ao Root diam: 2.80 cm Ao Asc diam:  3.40 cm MITRAL VALVE               TRICUSPID VALVE MV Area (PHT): 3.27 cm    TR Peak grad:   25.4 mmHg MV Decel Time: 232 msec    TR Vmax:        252.00 cm/s MV E velocity: 88.60 cm/s MV A velocity: 98.80 cm/s  SHUNTS MV E/A ratio:   0.90        Systemic VTI:  0.28 m                            Systemic Diam: 1.90 cm Kardie Tobb DO Electronically signed by Thomasene Ripple DO Signature Date/Time: 07/17/2022/4:32:56 PM    Final    CT Angio Chest/Abd/Pel for Dissection W and/or Wo Contrast  Result Date: 07/16/2022 CLINICAL DATA:  Acute abdominal pain. EXAM:  CT ANGIOGRAPHY CHEST, ABDOMEN AND PELVIS TECHNIQUE: Non-contrast CT of the chest was initially obtained. Multidetector CT imaging through the chest, abdomen and pelvis was performed using the standard protocol during bolus administration of intravenous contrast. Multiplanar reconstructed images and MIPs were obtained and reviewed to evaluate the vascular anatomy. RADIATION DOSE REDUCTION: This exam was performed according to the departmental dose-optimization program which includes automated exposure control, adjustment of the mA and/or kV according to patient size and/or use of iterative reconstruction technique. CONTRAST:  OMNIPAQUE IOHEXOL 350 MG/ML SOLN COMPARISON:  January 17, 2021.  April 07, 2014.  April 03, 2009. FINDINGS: CTA CHEST FINDINGS Cardiovascular: Preferential opacification of the thoracic aorta. No evidence of thoracic aortic aneurysm or dissection. Normal heart size. No pericardial effusion. Mediastinum/Nodes: Moderate size hiatal hernia is noted with postsurgical changes present. Thyroid gland is unremarkable. No definite adenopathy is noted. Lungs/Pleura: Lungs are clear. No pleural effusion or pneumothorax. Musculoskeletal: No chest wall abnormality. No acute or significant osseous findings. Review of the MIP images confirms the above findings. CTA ABDOMEN AND PELVIS FINDINGS VASCULAR Aorta: Atherosclerosis of abdominal aorta is noted without aneurysm or dissection. Celiac: Patent without evidence of aneurysm, dissection, vasculitis or significant stenosis. SMA: Patent without evidence of aneurysm, dissection, vasculitis or significant stenosis. Renals: Both renal  arteries are patent without evidence of aneurysm, dissection, vasculitis, fibromuscular dysplasia or significant stenosis. IMA: Patent without evidence of aneurysm, dissection, vasculitis or significant stenosis. Inflow: Patent without evidence of aneurysm, dissection, vasculitis or significant stenosis. Veins: No obvious venous abnormality within the limitations of this arterial phase study. Review of the MIP images confirms the above findings. NON-VASCULAR Hepatobiliary: No focal liver abnormality is seen. No gallstones, gallbladder wall thickening, or biliary dilatation. Pancreas: Unremarkable. No pancreatic ductal dilatation or surrounding inflammatory changes. Spleen: Normal in size without focal abnormality. Adrenals/Urinary Tract: Adrenal glands are unremarkable. Kidneys are normal, without renal calculi, focal lesion, or hydronephrosis. Bladder is unremarkable. Stomach/Bowel: There is no evidence of bowel obstruction or inflammation. The appendix appears normal. Lymphatic: No adenopathy is noted. Reproductive: Calcified uterine fibroid is noted. No adnexal abnormality. Other: No abdominal wall hernia or abnormality. No abdominopelvic ascites. Musculoskeletal: No acute or significant osseous findings. Review of the MIP images confirms the above findings. IMPRESSION: No evidence of thoracic or abdominal aortic dissection or aneurysm. Moderate size sliding-type hiatal hernia. Calcified uterine fibroid. Aortic Atherosclerosis (ICD10-I70.0). Electronically Signed   By: Lupita Raider M.D.   On: 07/16/2022 13:41   DG Chest 2 View  Result Date: 07/16/2022 CLINICAL DATA:  Central chest pain EXAM: CHEST - 2 VIEW COMPARISON:  Chest x-ray dated 01/12/2016 FINDINGS: Heart size and mediastinal contours are within normal limits. Lungs are clear. No pleural effusion or pneumothorax is seen. No acute-appearing osseous abnormality. IMPRESSION: No active cardiopulmonary disease. No evidence of pneumonia or pulmonary  edema. Electronically Signed   By: Bary Richard M.D.   On: 07/16/2022 10:52    Cardiac Studies   Echo: 07/17/2022  IMPRESSIONS     1. Left ventricular ejection fraction, by estimation, is 55 to 60%. The  left ventricle has normal function. The left ventricle has no regional  wall motion abnormalities. There is mild concentric left ventricular  hypertrophy. Left ventricular diastolic  parameters are indeterminate.   2. Right ventricular systolic function is normal. The right ventricular  size is normal. There is normal pulmonary artery systolic pressure.   3. The mitral valve is normal in structure. Mild mitral valve  regurgitation. No evidence of  mitral stenosis.   4. The aortic valve is normal in structure. Aortic valve regurgitation is  not visualized. Aortic valve sclerosis/calcification is present, without  any evidence of aortic stenosis.   5. The inferior vena cava is normal in size with greater than 50%  respiratory variability, suggesting right atrial pressure of 3 mmHg.   FINDINGS   Left Ventricle: Left ventricular ejection fraction, by estimation, is 55  to 60%. The left ventricle has normal function. The left ventricle has no  regional wall motion abnormalities. The left ventricular internal cavity  size was normal in size. There is   mild concentric left ventricular hypertrophy. Left ventricular diastolic  parameters are indeterminate.   Right Ventricle: The right ventricular size is normal. No increase in  right ventricular wall thickness. Right ventricular systolic function is  normal. There is normal pulmonary artery systolic pressure. The tricuspid  regurgitant velocity is 2.52 m/s, and   with an assumed right atrial pressure of 3 mmHg, the estimated right  ventricular systolic pressure is 28.4 mmHg.   Left Atrium: Left atrial size was normal in size.   Right Atrium: Right atrial size was normal in size.   Pericardium: Trivial pericardial effusion is  present. The pericardial  effusion is circumferential.   Mitral Valve: The mitral valve is normal in structure. Mild mitral valve  regurgitation. No evidence of mitral valve stenosis.   Tricuspid Valve: The tricuspid valve is normal in structure. Tricuspid  valve regurgitation is mild . No evidence of tricuspid stenosis.   Aortic Valve: The aortic valve is normal in structure. Aortic valve  regurgitation is not visualized. Aortic valve sclerosis/calcification is  present, without any evidence of aortic stenosis.   Pulmonic Valve: The pulmonic valve was normal in structure. Pulmonic valve  regurgitation is trivial. No evidence of pulmonic stenosis.   Aorta: The aortic root is normal in size and structure.   Venous: The inferior vena cava is normal in size with greater than 50%  respiratory variability, suggesting right atrial pressure of 3 mmHg.   IAS/Shunts: No atrial level shunt detected by color flow Doppler.   Patient Profile     67 y.o. female with a hx of CAD (non-obstructive) with chronic stable angina, HTN, HLD, DM-2, CKD stage III who is being seen 07/17/2022 for the evaluation of chest pain at the request of Dr. Joseph Art.   Assessment & Plan    Atypical Chest pain (pleuritic) CAD  -- Known history of CAD from cardiac catheterization in 02/2021 moderately diffuse calcified single-vessel CAD and small caliber codominant RCA with multiple segments of 70 to 90% stenosis which has been treated medically. -- High-sensitivity troponin negative x 3 -- Echo with LVEF of 55 to 60%, no regional wall motion abnormality -- Describes chest pain more in a pleuritic since with pain with deep inspiration today? --Continue aspirin, statin, increase Coreg to 6.25 mg twice daily, amlodipine 10 mg daily, irbesartan 300 mg daily, Imdur 60 mg daily  Hypertension --Blood pressure initially 200 systolic on admission, improved with changed but remains elevated  --Continue amlodipine 10 mg daily,  increase carvedilol to 6.25 mg twice daily, irbesartan 300 mg daily  Hyperlipidemia -- LDL 42 -- Continue atorvastatin 80 mg daily  Diabetes CKD stage IIIa Hiatal hernia (noted on CT scan)  Will arrange for outpatient follow up in the office. Have asked that she keep log of BPs to bring to follow up for further adjustments  For questions or updates, please contact [facility]  Please consult [email or web address] for contact info under        Signed, Laverda Page, NP  07/18/2022, 9:02 AM    I have personally seen and examined this patient. I agree with the assessment and plan as outlined above.  She is doing well today. Minimal chest pain. Her chest pain is not felt to be cardiac related. BP is better controlled but still elevated.  Agree with increasing Coreg today. Continue Norvasc and Irbesartan.  OK to d/c home today from cardiac perspective. We will plan f/u in our office.   Verne Carrow, MD, [facility] 07/18/2022 10:18 AM

## 2022-07-18 NOTE — TOC Benefit Eligibility Note (Signed)
Patient Advocate Encounter  Insurance veriProduct/process development scientisturrently admitted and upon discharge could be taking Entresto 24-26 mg.  The current 30 day co-pay is $477.64 due to a $1,200 deductible .   The patient is insured through [employer]   This test claim was processed through [facility]- copay amounts may vary at other pharmacies due to pharmacy/plan contracts, or as the patient moves through the different stages of their insurance plan.  Roland Earl, CPHT Pharmacy Patient Advocate Specialist [facility] Patient Advocate Team Direct Number: [phone]  Fax: [phone]

## 2022-07-18 NOTE — Discharge Summary (Signed)
Physician Discharge Summary  Karen Gardner WUJ:[id number] DOB: 26-Mar-1956 DOA: 07/16/2022  PCP: Karen Ishihara, MD  Admit date: 07/16/2022 Discharge date: 07/18/2022  Time spent: 30 minutes  Recommendations for Outpatient Follow-up:   CAD with moderate to severe codominant disease with RCA and LCA  -Per cardiology -History of coronary artery disease involving codominant RCA with multiple segments of 70 to 90% stenosis with plans to treat medically  -treat medically and defer cath for now. Her coronary anatomy is not conducive for intervention  -Believe symptoms secondary to uncontrolled BP   Atypical chest pain -TIMI risk score is 5, which indicates a 26% risk of all cause mortality,  - Musculoskeletal chest pain has refused NSAID pain medication - Atypical anginal symptoms? - Trend troponin - Echocardiogram pending - 4/15 EKG NSR with PAC -   Hx chronic stable angina -Patient with significant history of angina -Patient seen by Karen Gardner cardiology - Per Karen Gardner progress Notes patient has had issues of syncope in the past.   Essential HTN -Amlodipine 10 mg daily - Coreg 3.125 mg BID - Irbesartan 300 mg daily -4/15 increase Imdur 45 mg daily -Ranexa 500 mg BID -Per cardiology Will arrange for outpatient follow up in the office. Have asked that she keep log of BPs to bring to follow up for further adjustments    Diabetes type 2 controlled with hyperglycemia - 4/14 hemoglobin A1c= 7.0 -4/15 Levemir 5 units daily -NovoLog 7 units qac - 4/15 decrease to Sensitive SSI CBG (last 3)  Recent Labs    07/18/22 0509 07/18/22 0756 07/18/22 1133  GLUCAP 109* 108* 229*  -Restart home medications at discharge  HLD - 4/15 lipid panel pending   Iron deficiency anemia? -Transfuse for hemoglobin<7 - Anemia panel pending - Occult blood pending Lab Results  Component Value Date   HGB 11.6 (L) 07/18/2022   HGB 10.5 (L) 07/17/2022   HGB 10.8 (L)  07/16/2022   HGB 11.5 (L) 07/16/2022   HGB 12.3 01/27/2021   Hypokalemia - Potassium goal> 4 -4/15 K-Dur 40 mEq x 3 doses   Obesity (BMI 31.62 kg/m)     Discharge Diagnoses:  Principal Problem:   Atypical chest pain Active Problems:   Hyperlipidemia associated with type 2 diabetes mellitus   DM2 (diabetes mellitus, type 2)   Iron deficiency anemia   Coronary artery disease involving native coronary artery of native heart with angina pectoris   Chronic stable angina   Discharge Condition: Stable  Diet recommendation: Heart healthy/carb modified  Filed Weights   07/16/22 1007  Weight: 86.2 kg    History of present illness:  67 y.o. BF PMHx anxiety, diabetes type 2 controlled with hyperglycemia, GERD, CAD, chronic atypical anginal symptoms    Admitted to the hospital for observation with complaints of chest pain.  Patient states she does have a history of intermittent chest pressure and discomfort not associated with activity.  However in the last 24 hours, she has been having severe sharp stabbing in the center of her chest radiating to the right side of the chest.  She denies any overlying rash, there is no association with activity.  It hurts more with deep breaths, moves her right arm.  She has a cough but this is chronic for her, no fevers, no chills, no nausea, vomiting, etc.   Hospital Course:  See above  Procedures: Left Ventricle: LVEF= 55 to 60%. Left ventricular diastolic parameters are indeterminate.    Consultations: Cardiology    Discharge  Exam: Vitals:   07/17/22 1200 07/17/22 1607 07/17/22 2037 07/18/22 0926  BP: (!) 162/84 (!) 168/85 (!) 161/85 (!) 159/88  Pulse: (!) 58 68 76 73  Resp: Temp: 98.7 F (37.1 C) 98.5 F (36.9 C) 98.2 F (36.8 C) 98.2 F (36.8 C)  TempSrc: Oral Oral Oral Oral  SpO2: 98% 99% 99% 98%  Weight:      Height:        General: A/O x 4 No acute respiratory distress Eyes: negative scleral hemorrhage,  negative anisocoria, negative icterus ENT: Negative Runny nose, negative gingival bleeding, Neck:  Negative scars, masses, torticollis, lymphadenopathy, JVD Lungs: Clear to auscultation bilaterally without wheezes or crackles Cardiovascular: Regular rate and rhythm without murmur gallop or rub normal S1 and S2   Discharge Instructions   Allergies as of 07/18/2022       Reactions   Codeine Diarrhea   Lactose Intolerance (gi) Diarrhea, Other (See Comments)   *Gas*    Oatmeal Other (See Comments)   "Gas"        Medication List     STOP taking these medications    Debrox 6.5 % OTIC solution Generic drug: carbamide peroxide   losartan 100 MG tablet Commonly known as: COZAAR       TAKE these medications    amLODipine 10 MG tablet Commonly known as: NORVASC Take 1 tablet (10 mg total) by mouth daily. Start taking on: July 19, 2022   aspirin EC 81 MG tablet Take 1 tablet (81 mg total) by mouth daily. 30 minutes prior to taking Isosorbide   atorvastatin 80 MG tablet Commonly known as: LIPITOR Take 1 tablet by mouth at bedtime.   CALCIUM PO Take 1 capsule by mouth 3 (three) times daily.   carvedilol 6.25 MG tablet Commonly known as: COREG TAKE 1 TABLET BY MOUTH TWICE DAILY   escitalopram 10 MG tablet Commonly known as: LEXAPRO Take 10 mg by mouth at bedtime.   fluticasone 50 MCG/ACT nasal spray Commonly known as: FLONASE Place 1 spray into both nostrils daily.   FreeStyle Libre 14 Day Sensor Misc Apply topically every 14 (fourteen) days.   glucose blood test strip Commonly known as: OneTouch Verio Use as instructed to check blood sugar once a day dx code E11.65   insulin lispro 100 UNIT/ML KwikPen Commonly known as: HUMALOG Inject 8 Units into the skin 3 (three) times daily.   irbesartan 300 MG tablet Commonly known as: AVAPRO Take 1 tablet (300 mg total) by mouth at bedtime.   isosorbide mononitrate 60 MG 24 hr tablet Commonly known as:  IMDUR Take 1 tablet (60 mg total) by mouth daily. Start taking on: July 19, 2022 What changed: See the new instructions.   lansoprazole 15 MG capsule Commonly known as: PREVACID Take 15 mg by mouth at bedtime.   metFORMIN 500 MG tablet Commonly known as: GLUCOPHAGE Take 500 mg by mouth at bedtime.   multivitamin with minerals Tabs tablet Take 1 tablet by mouth 2 (two) times daily.   nitroGLYCERIN 0.4 MG SL tablet Commonly known as: NITROSTAT Place 1 tablet (0.4 mg total) under the tongue every 5 (five) minutes as needed for chest pain.   ondansetron 4 MG tablet Commonly known as: ZOFRAN Take 1 tablet (4 mg total) by mouth every 6 (six) hours as needed for nausea.   ranolazine 500 MG 12 hr tablet Commonly known as: RANEXA Take 1 tablet (500 mg total) by mouth 2 (two) times daily.  traZODone 50 MG tablet Commonly known as: DESYREL Take 0.5 tablets (25 mg total) by mouth at bedtime as needed for sleep.       Allergies  Allergen Reactions   Codeine Diarrhea   Lactose Intolerance (Gi) Diarrhea and Other (See Comments)    *Gas*    Oatmeal Other (See Comments)    "Gas"       The results of significant diagnostics from this hospitalization (including imaging, microbiology, ancillary and laboratory) are listed below for reference.    Significant Diagnostic Studies: ECHOCARDIOGRAM COMPLETE  Result Date: 07/17/2022    ECHOCARDIOGRAM REPORT   Patient Name:   APPHIA CROPLEY Date of Exam: 07/17/2022 Medical Rec #:  [id number]        Height:       65.0 in Accession #:    [id number]       Weight:       190.0 lb Date of Birth:  [birth year]        BSA:          1.935 m Patient Age:    67 years         BP:           162/84 mmHg Patient Gender: F                HR:           68 bpm. Exam Location:  Inpatient Procedure: 2D Echo, Cardiac Doppler and Color Doppler Indications:    chest pain  History:        Patient has prior history of Echocardiogram examinations, most                  recent 03/29/2021. CAD; Risk Factors:Hypertension, Dyslipidemia                 and Diabetes.  Sonographer:    Delcie Roch RDCS Referring Phys: [id number] Darryll Raju J Shalena Ezzell IMPRESSIONS  1. Left ventricular ejection fraction, by estimation, is 55 to 60%. The left ventricle has normal function. The left ventricle has no regional wall motion abnormalities. There is mild concentric left ventricular hypertrophy. Left ventricular diastolic parameters are indeterminate.  2. Right ventricular systolic function is normal. The right ventricular size is normal. There is normal pulmonary artery systolic pressure.  3. The mitral valve is normal in structure. Mild mitral valve regurgitation. No evidence of mitral stenosis.  4. The aortic valve is normal in structure. Aortic valve regurgitation is not visualized. Aortic valve sclerosis/calcification is present, without any evidence of aortic stenosis.  5. The inferior vena cava is normal in size with greater than 50% respiratory variability, suggesting right atrial pressure of 3 mmHg. FINDINGS  Left Ventricle: Left ventricular ejection fraction, by estimation, is 55 to 60%. The left ventricle has normal function. The left ventricle has no regional wall motion abnormalities. The left ventricular internal cavity size was normal in size. There is  mild concentric left ventricular hypertrophy. Left ventricular diastolic parameters are indeterminate. Right Ventricle: The right ventricular size is normal. No increase in right ventricular wall thickness. Right ventricular systolic function is normal. There is normal pulmonary artery systolic pressure. The tricuspid regurgitant velocity is 2.52 m/s, and  with an assumed right atrial pressure of 3 mmHg, the estimated right ventricular systolic pressure is 28.4 mmHg. Left Atrium: Left atrial size was normal in size. Right Atrium: Right atrial size was normal in size. Pericardium: Trivial pericardial effusion is present. The pericardial  effusion is circumferential. Mitral Valve:  The mitral valve is normal in structure. Mild mitral valve regurgitation. No evidence of mitral valve stenosis. Tricuspid Valve: The tricuspid valve is normal in structure. Tricuspid valve regurgitation is mild . No evidence of tricuspid stenosis. Aortic Valve: The aortic valve is normal in structure. Aortic valve regurgitation is not visualized. Aortic valve sclerosis/calcification is present, without any evidence of aortic stenosis. Pulmonic Valve: The pulmonic valve was normal in structure. Pulmonic valve regurgitation is trivial. No evidence of pulmonic stenosis. Aorta: The aortic root is normal in size and structure. Venous: The inferior vena cava is normal in size with greater than 50% respiratory variability, suggesting right atrial pressure of 3 mmHg. IAS/Shunts: No atrial level shunt detected by color flow Doppler.  LEFT VENTRICLE PLAX 2D LVIDd:         4.40 cm   Diastology LVIDs:         3.00 cm   LV e' medial:    5.44 cm/s LV PW:         0.90 cm   LV E/e' medial:  16.3 LV IVS:        1.20 cm   LV e' lateral:   8.59 cm/s LVOT diam:     1.90 cm   LV E/e' lateral: 10.3 LV SV:         80 LV SV Index:   41 LVOT Area:     2.84 cm  RIGHT VENTRICLE             IVC RV Basal diam:  2.80 cm     IVC diam: 2.00 cm RV S prime:     12.40 cm/s TAPSE (M-mode): 1.9 cm LEFT ATRIUM             Index        RIGHT ATRIUM           Index LA diam:        3.90 cm 2.02 cm/m   RA Area:     13.90 cm LA Vol (A2C):   43.5 ml 22.48 ml/m  RA Volume:   34.00 ml  17.57 ml/m LA Vol (A4C):   46.3 ml 23.92 ml/m LA Biplane Vol: 47.8 ml 24.70 ml/m  AORTIC VALVE LVOT Vmax:   116.00 cm/s LVOT Vmean:  77.100 cm/s LVOT VTI:    0.283 m  AORTA Ao Root diam: 2.80 cm Ao Asc diam:  3.40 cm MITRAL VALVE               TRICUSPID VALVE MV Area (PHT): 3.27 cm    TR Peak grad:   25.4 mmHg MV Decel Time: 232 msec    TR Vmax:        252.00 cm/s MV E velocity: 88.60 cm/s MV A velocity: 98.80 cm/s  SHUNTS MV E/A  ratio:  0.90        Systemic VTI:  0.28 m                            Systemic Diam: 1.90 cm Kardie Tobb DO Electronically signed by Thomasene Ripple DO Signature Date/Time: 07/17/2022/4:32:56 PM    Final    CT Angio Chest/Abd/Pel for Dissection W and/or Wo Contrast  Result Date: 07/16/2022 CLINICAL DATA:  Acute abdominal pain. EXAM: CT ANGIOGRAPHY CHEST, ABDOMEN AND PELVIS TECHNIQUE: Non-contrast CT of the chest was initially obtained. Multidetector CT imaging through the chest, abdomen and pelvis was performed using the standard protocol during bolus administration of intravenous  contrast. Multiplanar reconstructed images and MIPs were obtained and reviewed to evaluate the vascular anatomy. RADIATION DOSE REDUCTION: This exam was performed according to the departmental dose-optimization program which includes automated exposure control, adjustment of the mA and/or kV according to patient size and/or use of iterative reconstruction technique. CONTRAST:  OMNIPAQUE IOHEXOL 350 MG/ML SOLN COMPARISON:  January 17, 2021.  April 07, 2014.  April 03, 2009. FINDINGS: CTA CHEST FINDINGS Cardiovascular: Preferential opacification of the thoracic aorta. No evidence of thoracic aortic aneurysm or dissection. Normal heart size. No pericardial effusion. Mediastinum/Nodes: Moderate size hiatal hernia is noted with postsurgical changes present. Thyroid gland is unremarkable. No definite adenopathy is noted. Lungs/Pleura: Lungs are clear. No pleural effusion or pneumothorax. Musculoskeletal: No chest wall abnormality. No acute or significant osseous findings. Review of the MIP images confirms the above findings. CTA ABDOMEN AND PELVIS FINDINGS VASCULAR Aorta: Atherosclerosis of abdominal aorta is noted without aneurysm or dissection. Celiac: Patent without evidence of aneurysm, dissection, vasculitis or significant stenosis. SMA: Patent without evidence of aneurysm, dissection, vasculitis or significant stenosis. Renals: Both  renal arteries are patent without evidence of aneurysm, dissection, vasculitis, fibromuscular dysplasia or significant stenosis. IMA: Patent without evidence of aneurysm, dissection, vasculitis or significant stenosis. Inflow: Patent without evidence of aneurysm, dissection, vasculitis or significant stenosis. Veins: No obvious venous abnormality within the limitations of this arterial phase study. Review of the MIP images confirms the above findings. NON-VASCULAR Hepatobiliary: No focal liver abnormality is seen. No gallstones, gallbladder wall thickening, or biliary dilatation. Pancreas: Unremarkable. No pancreatic ductal dilatation or surrounding inflammatory changes. Spleen: Normal in size without focal abnormality. Adrenals/Urinary Tract: Adrenal glands are unremarkable. Kidneys are normal, without renal calculi, focal lesion, or hydronephrosis. Bladder is unremarkable. Stomach/Bowel: There is no evidence of bowel obstruction or inflammation. The appendix appears normal. Lymphatic: No adenopathy is noted. Reproductive: Calcified uterine fibroid is noted. No adnexal abnormality. Other: No abdominal wall hernia or abnormality. No abdominopelvic ascites. Musculoskeletal: No acute or significant osseous findings. Review of the MIP images confirms the above findings. IMPRESSION: No evidence of thoracic or abdominal aortic dissection or aneurysm. Moderate size sliding-type hiatal hernia. Calcified uterine fibroid. Aortic Atherosclerosis (ICD10-I70.0). Electronically Signed   By: Lupita Raider M.D.   On: 07/16/2022 13:41   DG Chest 2 View  Result Date: 07/16/2022 CLINICAL DATA:  Central chest pain EXAM: CHEST - 2 VIEW COMPARISON:  Chest x-ray dated 01/12/2016 FINDINGS: Heart size and mediastinal contours are within normal limits. Lungs are clear. No pleural effusion or pneumothorax is seen. No acute-appearing osseous abnormality. IMPRESSION: No active cardiopulmonary disease. No evidence of pneumonia or  pulmonary edema. Electronically Signed   By: Bary Richard M.D.   On: 07/16/2022 10:52    Microbiology: No results found for this or any previous visit (from the past 240 hour(s)).   Labs: Basic Metabolic Panel: Recent Labs  Lab 07/16/22 1025 07/16/22 1740 07/17/22 0321 07/17/22 0947 07/18/22 0206  NA 138  --  140 138 139  K 3.3*  --  3.4* 3.3* 3.8  CL 105  --  107 106 106  CO2 25  --  26 24 27   GLUCOSE 250*  --  214* 176* 113*  BUN 24*  --  18 19 19   CREATININE 1.15* 1.12* 1.36* 1.25* 1.23*  CALCIUM 8.9  --  8.6* 8.7* 9.0  MG  --   --   --  1.7  --   PHOS  --   --   --  3.2  --    Liver Function Tests: Recent Labs  Lab 07/17/22 0947  AST 25  ALT 30  ALKPHOS 54  BILITOT 0.8  PROT 6.0*  ALBUMIN 3.0*   No results for input(s): "LIPASE", "AMYLASE" in the last 168 hours. No results for input(s): "AMMONIA" in the last 168 hours. CBC: Recent Labs  Lab 07/16/22 1025 07/16/22 1740 07/17/22 0321 07/18/22 0206  WBC 2.9* 3.2* 3.3* 3.7*  NEUTROABS  --   --   --  1.2*  HGB 11.5* 10.8* 10.5* 11.6*  HCT 37.1 34.4* 31.8* 35.3*  MCV 94.4 94.5 91.1 91.5  PLT 145* 136* 127* 146*   Cardiac Enzymes: No results for input(s): "CKTOTAL", "CKMB", "CKMBINDEX", "TROPONINI" in the last 168 hours. BNP: BNP (last 3 results) No results for input(s): "BNP" in the last 8760 hours.  ProBNP (last 3 results) No results for input(s): "PROBNP" in the last 8760 hours.  CBG: Recent Labs  Lab 07/17/22 1605 07/17/22 2107 07/18/22 0509 07/18/22 0756 07/18/22 1133  GLUCAP 100* 165* 109* 108* 229*       Signed:  Carolyne Littles, MD [facility]

## 2022-07-30 NOTE — Progress Notes (Unsigned)
Cardiology Office Note:    Date:  07/31/2022   ID:  Karen Gardner, DOB [birth year], MRN [id number]  PCP:  [name], MD   [facility] Providers Cardiologist:  Bryan Lemma, MD {  Referring MD: [name],*   Chief Complaint  Patient presents with   Follow-up    Chest pain     History of Present Illness:    Karen Gardner is a 67 y.o. female with a hx of CAD, hypertension, hyperlipidemia, type 2 diabetes, CKD stage III, chronic chest pain.  Patient is followed by Dr. Herbie Baltimore, and presents for a follow-up appointment after recent hospitalization.  Patient was first referred to cardiology in 2018 for evaluation of syncope.  She did have mild orthostatic blood pressure dropping clinic.  Underwent echocardiogram on 05/30/16 that showed EF 60-65%, no regional wall motion abnormalities, mild LVH with severe hypertrophy of the basal septum.  She also wore a cardiac monitor that showed normal sinus rhythm with rare ectopy.  Patient was later seen by her PCP in 10/2020 for cardiac clearance prior to knee surgery.  PCP noted that EKG showed a possible old anteroseptal infarct.  Her PCP referred her to cardiology and she was seen by Dr. Herbie Baltimore on 12/30/2020.  She underwent CT coronary on 01/17/2021 that showed a coronary calcium score of 2437 (99th percentile), moderate atherosclerosis but possibly severe.  Study was sent for FFR that suggested a possible high-grade lesion in the mid RCA with possible occlusion.  Patient had a left heart catheterization on 02/02/2021 that showed moderate-severe diffusely calcified coronary arteries with severe single-vessel disease involving a small caliber codominant RCA with multiple segments of 70 to 90% stenoses.  There was also diffuse mild-moderate stenosis through the left coronary system.  There were no targets for intervention, so patient was managed with medical therapies.  Patient later underwent echocardiogram on 03/29/2021  that showed EF 55-60%, no regional wall motion abnormalities, mild LVH, grade 1 diastolic dysfunction, normal RV systolic function.  Patient was recently admitted to [facility] from 4/14-4/16/2024 for evaluation of chest pain.  Her BP was elevated ang close to . Per notes, patient had woken up with 8/10 chest pain that was sharp, tight, and radiated to her back.  She took 3 sublingual nitroglycerin tablets without relief and decided to go to the [facility] long ED.  High-sensitivity troponins were negative (5, 4, 4).  CTA chest was negative for dissection. With BP control, her chest pain resolved. Dr. Lynnette Caffey reviewed her previous cath films, and determined that the best treatment for her at this time would be improved BP control. She underwent echocardiogram on 07/17/22 that showed EF 55-60%, no regional wall motion abnormalities, mild LVH, normal RV systolic function, mild mitral valve regurgitation.  She was discharged on carvedilol 6.25 mg twice daily, amlodipine 10 mg daily, irbesartan 300 mg daily, Imdur 60 mg daily, aspirin, statin.  Patient reports that for the past several months, she has been having intermittent episodes of chest tightness/pressure. Previously, symptoms were only present on exertion, but she has started to have them at rest as well. Episodes of chest tightness has become more frequent over time. They usually last a few minutes, and resolve with either rest or nitroglycerin. She did go to the ED on 4/14 for pleuritic chest pain, which has since resolved. However, she has continued to have chest tightness/pressure on exertion. She denies syncope, near syncope, shortness of breath. Has some nasal congestion, cough, and itchy eyes that  she believes are allergies. She rarely will get a bit dizzy when she stands up, but this does not bother her very much.    Past Medical History:  Diagnosis Date   Allergic rhinitis    Anxiety    Arthritis    Asthma    childhood    Chronic low back pain    CKD (chronic kidney disease), stage III (HCC)    However, most recent creatinine 1.5.   Coronary artery disease involving native heart with other form of angina pectoris, unspecified vessel or lesion type [facility]) 11/16/2020   11/16/2020: CORONARY CA++ SCORE: Agatston Score 2245.  LAD 1093, LCx 959, RCA 193 (99th percentile) -> COR CTA (Agatston 2437) -moderate to severe multivessel CAD with significant blooming in all 3 epicardial vessels.  CAD RADS 3-4. FFRCT suggests mid RCA occlusion, => CARDIAC CATH 02/02/21: RCA: prox 70%, Mid-distal 90% & distal 90% (small caliber, Not viable PCI target; Prox RI 50%.   Depression    With anxiety   GERD (gastroesophageal reflux disease)    Headache    sinus headaches    Hypercholesterolemia    Hypertension    Iron deficiency anemia 05/26/2015   Has required transfusions-followed by Dr. Myna Hidalgo   Iron malabsorption 05/26/2015   Menopause    Numbness in both hands    mostly at night   Obesity    Osteoarthritis of both knees    Status post right TKA (and ankle) with plans for left TKA   Type 2 diabetes mellitus without complication, with long-term current use of insulin (HCC)    Type 2 on insulin pump   Vasovagal syncope 2018   Negative work-up   Vitamin D deficiency     Past Surgical History:  Procedure Laterality Date   COLONOSCOPY     EYE SURGERY Bilateral    [facility]    HERNIA REPAIR     umb hernia as child   KNEE ARTHROSCOPY  02/12/2012   Procedure: ARTHROSCOPY KNEE;  Surgeon: Nestor Lewandowsky, MD;  Location: [facility];  Service: Orthopedics;  Laterality: Right;  Partial Lateral Meniscectomy, Debridement chondromalacia   LAPAROSCOPIC GASTRIC SLEEVE RESECTION N/A 03/14/2016   Procedure: LAPAROSCOPIC GASTRIC SLEEVE RESECTION, WITH REPAIR OF HIATAL HERNIA REPAI, AND UPPER ENDO;  Surgeon: Luretha Murphy, MD;  Location: [facility];  Service: General;  Laterality: N/A;   LEFT HEART CATH AND CORONARY ANGIOGRAPHY N/A  02/02/2021   Procedure: LEFT HEART CATH AND CORONARY ANGIOGRAPHY;  Surgeon: Marykay Lex, MD;  Location: [facility];; Heavily calcified tortuous codominant RCA with proximal 70% stenosis.  Mid to distal RCA 90% stenosis.  Distal RCA 90%.  Not a PCI target.  50% RI, proximal to mid LAD 20%.  Mid to distal LCx 30% with 30% in LP AV-heavily calcified.  EF 55-65%.  Mildly elevated LVEDP.   ORIF ANKLE FRACTURE Right 02/11/2014   Procedure: OPEN REDUCTION INTERNAL FIXATION (ORIF) RIGHT ANKLE FRACTURE;  Surgeon: Nestor Lewandowsky, MD;  Location: [facility];  Service: Orthopedics;  Laterality: Right;   TOTAL KNEE ARTHROPLASTY Right 08/05/2014   Procedure: TOTAL KNEE ARTHROPLASTY;  Surgeon: Gean Birchwood, MD;  Location: [facility];  Service: Orthopedics;  Laterality: Right;   TRANSTHORACIC ECHOCARDIOGRAM  05/2016   EF 60 to 65%.  Severe basal septal LVH with mild concentric LVH.  No R WMA.  GR 1 DD.  Indeterminate filling pressures. Minimal valve disease.   TRANSTHORACIC ECHOCARDIOGRAM  03/29/2021   EF 55 to 60%.  No  R WMA-normal strain pattern.  GR 1 DD.  Normal RV with RV P mild aortic sclerosis but no stenosis no AI.  Mildly elevated RAP.   TRIGGER FINGER RELEASE Right 10/14/2018   Procedure: RELEASE TRIGGER FINGER/A-1 PULLEY;  Surgeon: Betha Loa, MD;  Location: [facility];  Service: Orthopedics;  Laterality: Right;   UPPER GI ENDOSCOPY      Current Medications: Current Meds  Medication Sig   amLODipine (NORVASC) 10 MG tablet Take 1 tablet (10 mg total) by mouth daily.   aspirin EC 81 MG tablet Take 1 tablet (81 mg total) by mouth daily. 30 minutes prior to taking Isosorbide   CALCIUM PO Take 1 capsule by mouth 3 (three) times daily.    carvedilol (COREG) 6.25 MG tablet TAKE 1 TABLET BY MOUTH TWICE DAILY   Continuous Blood Gluc Sensor (FREESTYLE LIBRE 14 DAY SENSOR) MISC Apply topically every 14 (fourteen) days.   escitalopram (LEXAPRO) 10 MG tablet Take 10 mg by mouth at bedtime.     fluticasone (FLONASE) 50 MCG/ACT nasal spray Place 1 spray into both nostrils daily.   glucose blood (ONETOUCH VERIO) test strip Use as instructed to check blood sugar once a day dx code E11.65   insulin lispro (HUMALOG) 100 UNIT/ML KwikPen Inject 8 Units into the skin 3 (three) times daily.   irbesartan (AVAPRO) 300 MG tablet Take 1 tablet (300 mg total) by mouth at bedtime.   isosorbide mononitrate (IMDUR) 60 MG 24 hr tablet Take 1 tablet (60 mg total) by mouth daily.   lansoprazole (PREVACID) 15 MG capsule Take 15 mg by mouth at bedtime.    latanoprost (XALATAN) 0.005 % ophthalmic solution 1 drop at bedtime.   metFORMIN (GLUCOPHAGE) 500 MG tablet Take 500 mg by mouth at bedtime.   Multiple Vitamin (MULTIVITAMIN WITH MINERALS) TABS tablet Take 1 tablet by mouth 2 (two) times daily.   nitroGLYCERIN (NITROSTAT) 0.4 MG SL tablet Place 1 tablet (0.4 mg total) under the tongue every 5 (five) minutes as needed for chest pain.   ondansetron (ZOFRAN) 4 MG tablet Take 1 tablet (4 mg total) by mouth every 6 (six) hours as needed for nausea.   ranolazine (RANEXA) 1000 MG SR tablet Take 1 tablet (1,000 mg total) by mouth 2 (two) times daily.   ranolazine (RANEXA) 500 MG 12 hr tablet Take 1 tablet (500 mg total) by mouth 2 (two) times daily.   traZODone (DESYREL) 50 MG tablet Take 0.5 tablets (25 mg total) by mouth at bedtime as needed for sleep.   [DISCONTINUED] atorvastatin (LIPITOR) 80 MG tablet Take 1 tablet by mouth at bedtime.     Allergies:   Codeine, Lactose intolerance (gi), and Oatmeal   Social History   Socioeconomic History   Marital status: Single    Spouse name: Not on file   Number of children: Not on file   Years of education: Not on file   Highest education level: Bachelor's degree (e.g., BA, AB, BS)  Occupational History   Occupation: -Futures trader: [employer]    Comment: [employer]  Tobacco Use   Smoking status: Former    Packs/day: 0.25     Years: 1.00    Additional pack years: 0.00    Total pack years: 0.25    Types: Cigarettes    Quit date: 02/08/1972    Years since quitting: 50.5   Smokeless tobacco: Never  Substance and Sexual Activity   Alcohol use: No  Alcohol/week: 0.0 standard drinks of alcohol   Drug use: No   Sexual activity: Not Currently  Other Topics Concern   Not on file  Social History Narrative   She is single, 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  Previously worked as a Education officer, environmental.   She is single, lives alone.  Used to exercise by walking frequently, but with her knee arthritis, not walking as much.   Social Determinants of Health   Financial Resource Strain: Not on file  Food Insecurity: No Food Insecurity (07/16/2022)   Hunger Vital Sign    Worried About Running Out of Food in the Last Year: Never true    Ran Out of Food in the Last Year: Never true  Transportation Needs: No Transportation Needs (07/16/2022)   PRAPARE - Administrator, Civil Service (Medical): No    Lack of Transportation (Non-Medical): No  Physical Activity: Not on file  Stress: Not on file  Social Connections: Not on file     Family History: The patient's family history includes Alcohol abuse in her father; Heart attack (age of onset: 81) in her sister.  ROS:   Please see the history of present illness.     All other systems reviewed and are negative.  EKGs/Labs/Other Studies Reviewed:    The following studies were reviewed today: Cardiac Studies & Procedures   CARDIAC CATHETERIZATION  CARDIAC CATHETERIZATION 02/02/2021  Narrative   Ramus lesion is 50% stenosed. Prox LAD to Mid LAD lesion is 20% stenosed.   Heavily calcified, tortuous "codominant" RCA: Prox RCA lesion is 70% stenosed.  Mid RCA to Dist RCA lesion is 90% stenosed.  Dist RCA lesion is 90% stenosed. ->  Not PCI target   Mid Cx to Dist Cx lesion is 30% stenosed with 35% stenosed side branch in LPAV.  (Heavily  calcified)   ------------------------------------------   The left ventricular systolic function is normal. The left ventricular ejection fraction is 55-65% by visual estimate.   LV end diastolic pressure is mildly elevated.   There is no aortic valve stenosis.  SUMMARY Moderate-severe diffusely calcified coronary arteries with severe single-vessel disease involving a small caliber codominant RCA with multiple segments of 70 to 90% stenoses-best treated medically. Diffuse mild to moderate stenosis throughout the left coronary system-most notably 50% Ramus Intermedius. Mildly elevated LVEDP with systemic hypertension. Very tortuous Left Subclavian-Innominate Artery system -> FOR FUTURE CARDIAC CATHETERIZATIONS, WOULD RECOMMEND FEMORAL ACCESS DUE TO VERY DIFFICULT MANIPULATION OF CATHETERS, AND ARTERIAL SPASM.   RECOMMENDATIONS Aggressive risk factor modification with lipid, glycemic and blood pressure management Daily aspirin Add carvedilol 3.125 mg daily with plans to titrate up further in outpatient setting. Hold metformin 48 hours post cath  Bryan Lemma, MD  Findings Coronary Findings Diagnostic  Dominance: Co-dominant  Left Main Vessel is large.  Left Anterior Descending Vessel is large. The vessel exhibits minimal luminal irregularities. The vessel is moderately calcified. The vessel is tortuous. Prox LAD to Mid LAD lesion is 20% stenosed. The lesion is segmental and eccentric.  First Diagonal Branch Vessel is small in size. The vessel exhibits minimal luminal irregularities.  Ramus Intermedius Vessel is moderate in size There is mild diffuse disease throughout the vessel. There is moderate focal disease in the vessel. The vessel is calcified. Ramus lesion is 50% stenosed. The lesion is concentric. The lesion is moderately calcified.  Left Circumflex Vessel is large. There is mild diffuse disease throughout the vessel. There is mild focal disease in the vessel.  The  vessel is calcified. The vessel is tortuous. Mid Cx to Dist Cx lesion is 30% stenosed with 35% stenosed side branch in LPAV. The lesion is eccentric. The lesion is moderately calcified.  First Obtuse Marginal Branch Vessel is small in size.  First Left Posterolateral Branch Vessel is moderate in size.  Left Posterior Atrioventricular Artery Vessel is large in size.  Right Coronary Artery Vessel was injected. Vessel is small. Tapers from what looks like a moderate caliber vessel to very small caliber/diminutive &quot;codominant &quot;RCA -&gt; extensively tortuous and calcified.  Not PCI target There is moderate diffuse disease throughout the vessel. The vessel is severely calcified. The vessel is moderately tortuous. Prox RCA lesion is 70% stenosed. The lesion is focal, discrete and concentric. The lesion is severely calcified. Mid RCA to Dist RCA lesion is 90% stenosed. The lesion is located at the bend, segmental, eccentric and irregular. The lesion is severely calcified. Dist RCA lesion is 90% stenosed. The lesion is focal, discrete, eccentric and irregular. The lesion is moderately calcified.  Acute Marginal Branch Vessel is small in size.  Right Posterior Descending Artery Vessel is small in size.  Intervention  No interventions have been documented.     ECHOCARDIOGRAM  ECHOCARDIOGRAM COMPLETE 07/17/2022  Narrative ECHOCARDIOGRAM REPORT    Patient Name:   WILLOW SHIDLER Date of Exam: 07/17/2022 Medical Rec #:  [id number]        Height:       65.0 in Accession #:    [id number]       Weight:       190.0 lb Date of Birth:  20-Jul-1955        BSA:          1.935 m Patient Age:    67 years         BP:           162/84 mmHg Patient Gender: F                HR:           68 bpm. Exam Location:  Inpatient  Procedure: 2D Echo, Cardiac Doppler and Color Doppler  Indications:    chest pain  History:        Patient has prior history of Echocardiogram examinations,  most recent 03/29/2021. CAD; Risk Factors:Hypertension, Dyslipidemia and Diabetes.  Sonographer:    Delcie Roch RDCS Referring Phys: [id number] CURTIS J WOODS  IMPRESSIONS   1. Left ventricular ejection fraction, by estimation, is 55 to 60%. The left ventricle has normal function. The left ventricle has no regional wall motion abnormalities. There is mild concentric left ventricular hypertrophy. Left ventricular diastolic parameters are indeterminate. 2. Right ventricular systolic function is normal. The right ventricular size is normal. There is normal pulmonary artery systolic pressure. 3. The mitral valve is normal in structure. Mild mitral valve regurgitation. No evidence of mitral stenosis. 4. The aortic valve is normal in structure. Aortic valve regurgitation is not visualized. Aortic valve sclerosis/calcification is present, without any evidence of aortic stenosis. 5. The inferior vena cava is normal in size with greater than 50% respiratory variability, suggesting right atrial pressure of 3 mmHg.  FINDINGS Left Ventricle: Left ventricular ejection fraction, by estimation, is 55 to 60%. The left ventricle has normal function. The left ventricle has no regional wall motion abnormalities. The left ventricular internal cavity size was normal in size. There is mild concentric left ventricular hypertrophy. Left ventricular diastolic parameters are indeterminate.  Right Ventricle: The  right ventricular size is normal. No increase in right ventricular wall thickness. Right ventricular systolic function is normal. There is normal pulmonary artery systolic pressure. The tricuspid regurgitant velocity is 2.52 m/s, and with an assumed right atrial pressure of 3 mmHg, the estimated right ventricular systolic pressure is 28.4 mmHg.  Left Atrium: Left atrial size was normal in size.  Right Atrium: Right atrial size was normal in size.  Pericardium: Trivial pericardial effusion is present. The  pericardial effusion is circumferential.  Mitral Valve: The mitral valve is normal in structure. Mild mitral valve regurgitation. No evidence of mitral valve stenosis.  Tricuspid Valve: The tricuspid valve is normal in structure. Tricuspid valve regurgitation is mild . No evidence of tricuspid stenosis.  Aortic Valve: The aortic valve is normal in structure. Aortic valve regurgitation is not visualized. Aortic valve sclerosis/calcification is present, without any evidence of aortic stenosis.  Pulmonic Valve: The pulmonic valve was normal in structure. Pulmonic valve regurgitation is trivial. No evidence of pulmonic stenosis.  Aorta: The aortic root is normal in size and structure.  Venous: The inferior vena cava is normal in size with greater than 50% respiratory variability, suggesting right atrial pressure of 3 mmHg.  IAS/Shunts: No atrial level shunt detected by color flow Doppler.   LEFT VENTRICLE PLAX 2D LVIDd:         4.40 cm   Diastology LVIDs:         3.00 cm   LV e' medial:    5.44 cm/s LV PW:         0.90 cm   LV E/e' medial:  16.3 LV IVS:        1.20 cm   LV e' lateral:   8.59 cm/s LVOT diam:     1.90 cm   LV E/e' lateral: 10.3 LV SV:         80 LV SV Index:   41 LVOT Area:     2.84 cm   RIGHT VENTRICLE             IVC RV Basal diam:  2.80 cm     IVC diam: 2.00 cm RV S prime:     12.40 cm/s TAPSE (M-mode): 1.9 cm  LEFT ATRIUM             Index        RIGHT ATRIUM           Index LA diam:        3.90 cm 2.02 cm/m   RA Area:     13.90 cm LA Vol (A2C):   43.5 ml 22.48 ml/m  RA Volume:   34.00 ml  17.57 ml/m LA Vol (A4C):   46.3 ml 23.92 ml/m LA Biplane Vol: 47.8 ml 24.70 ml/m AORTIC VALVE LVOT Vmax:   116.00 cm/s LVOT Vmean:  77.100 cm/s LVOT VTI:    0.283 m  AORTA Ao Root diam: 2.80 cm Ao Asc diam:  3.40 cm  MITRAL VALVE               TRICUSPID VALVE MV Area (PHT): 3.27 cm    TR Peak grad:   25.4 mmHg MV Decel Time: 232 msec    TR Vmax:        252.00  cm/s MV E velocity: 88.60 cm/s MV A velocity: 98.80 cm/s  SHUNTS MV E/A ratio:  0.90        Systemic VTI:  0.28 m Systemic Diam: 1.90 cm  Thomasene Ripple DO Electronically signed  by Thomasene Ripple DO Signature Date/Time: 07/17/2022/4:32:56 PM    Final    MONITORS  CARDIAC EVENT MONITOR 06/12/2016  Narrative NSR, rare ectopy.  No sustained arrhythmias.   CT SCANS  CT CORONARY MORPH W/CTA COR W/SCORE 01/17/2021  Addendum 01/17/2021  9:20 PM ADDENDUM REPORT: 01/17/2021 21:18  CLINICAL DATA:  Chest pain  EXAM: Cardiac/Coronary CTA  TECHNIQUE: A non-contrast, gated CT scan was obtained with axial slices of 3 mm through the heart for calcium scoring. Calcium scoring was performed using the Agatston method. A 120 kV prospective, gated, contrast cardiac scan was obtained. Gantry rotation speed was 250 msecs and collimation was 0.6 mm. Two sublingual nitroglycerin tablets (0.8 mg) were given. The 3D data set was reconstructed in 5% intervals of the 35-75% of the R-R cycle. Diastolic phases were analyzed on a dedicated workstation using MPR, MIP, and VRT modes. The patient received 95 cc of contrast.  FINDINGS: Image quality: Excellent.  Noise artifact is: Limited.  Coronary Arteries:  Normal coronary origin.  Right dominance.  Left main: The left main is a large caliber vessel with a normal take off from the left coronary cusp that bifurcates to form a left anterior descending artery and a left circumflex artery. There is no plaque or stenosis.  Left anterior descending artery: The LAD gives off 2 patent diagonal branches. The proximal LAD is heavily calcified with at least moderate mixed plaque but possibly severe plaque with stenosis of at least 50-69% but possibly > 70% with high risk features. There is mild calcified plaque in the mid LAD with associated stenosis of 25-49%. The D1 is not well defined but appears to have at least a moderate calcified plaque with  stenosis 50-60%. There is significant blooming artifact so this may be overestimated. There is mild calcified plaque in the proximal D2 with associated stenosis of 25-49%.  Left circumflex artery: The LCX is non-dominant and gives off 2 patent obtuse marginal branches. There proximal to mid LCx is heavily calcified with circumferential calcification. There is at least moderate calcified plaque in the proximal and mid LCx with associated stenosis of 50-69% but may be > 70%. There is significant blooming artifact.  Right coronary artery: The RCA is dominant with normal take off from the right coronary cusp. The RCA terminates as a PDA and right posterolateral branch. There is moderate calcified plaque in the proximal RCA with associated stenosis of 50-69%. This is followed by moderate mixed plaque with associated stenosis of 50-69%. There is mild soft plaque in the distal RCA with associated stenosis of 50-69%.  Right Atrium: Right atrial size is within normal limits.  Right Ventricle: The right ventricular cavity is within normal limits.  Left Atrium: Left atrial size is normal in size with no left atrial appendage filling defect.  Left Ventricle: The ventricular cavity size is within normal limits. There are no stigmata of prior infarction. There is no abnormal filling defect.  Pulmonary arteries: Normal in size without proximal filling defect.  Pulmonary veins: Normal pulmonary venous drainage.  Pericardium: Normal thickness with no significant effusion or calcium present.  Cardiac valves: The aortic valve is trileaflet without significant calcification. The mitral valve is normal structure without significant calcification.  Aorta: Normal caliber with no significant disease.  Extra-cardiac findings: See attached radiology report for non-cardiac structures.  IMPRESSION: 1. Coronary calcium score of 2437. This was 99th percentile for age-, sex, and race-matched  controls.  2.  Normal coronary origin with right dominance.  3. Moderate  atherosclerosis but possibly severe. There is significant blooming artifact in all 3 epicardial vessels which may overestimate degree of stenosis. CAD RADS 3 but possibly 4.  4.  Consider Cardiac catheterization.  5.  Study has been submitted for FFR analysis.  RECOMMENDATIONS: 1. CAD-RADS 0: No evidence of CAD (0%). Consider non-atherosclerotic causes of chest pain.  2. CAD-RADS 1: Minimal non-obstructive CAD (0-24%). Consider non-atherosclerotic causes of chest pain. Consider preventive therapy and risk factor modification.  3. CAD-RADS 2: Mild non-obstructive CAD (25-49%). Consider non-atherosclerotic causes of chest pain. Consider preventive therapy and risk factor modification.  4. CAD-RADS 3: Moderate stenosis. Consider symptom-guided anti-ischemic pharmacotherapy as well as risk factor modification per guideline directed care. Additional analysis with CT FFR will be submitted.  5. CAD-RADS 4: Severe stenosis. (70-99% or > 50% left main). Cardiac catheterization or CT FFR is recommended. Consider symptom-guided anti-ischemic pharmacotherapy as well as risk factor modification per guideline directed care. Invasive coronary angiography recommended with revascularization per published guideline statements.  6. CAD-RADS 5: Total coronary occlusion (100%). Consider cardiac catheterization or viability assessment. Consider symptom-guided anti-ischemic pharmacotherapy as well as risk factor modification per guideline directed care.  7. CAD-RADS N: Non-diagnostic study. Obstructive CAD can't be excluded. Alternative evaluation is recommended.  Armanda Magic, MD   Electronically Signed By: Armanda Magic M.D. On: 01/17/2021 21:18  Narrative EXAM: OVER-READ INTERPRETATION  CT CHEST  The following report is an over-read performed by radiologist Dr. Richarda Overlie of [facility] on  01/17/2021. This over-read does not include interpretation of cardiac or coronary anatomy or pathology. The coronary calcium score/coronary CTA interpretation by the cardiologist is attached.  COMPARISON:  11/16/2020  FINDINGS: Vascular: Normal caliber of the visualized thoracic aorta. Limited evaluation of the pulmonary arteries.  Mediastinum/Nodes: Evidence for a hiatal hernia associated with previous gastric surgery.  Lungs/Pleura: No pleural effusions. Few subtle densities along the periphery of the right lower lobe appear to be new and favor atelectasis. Again noted is a 2 mm nodule in the left lower lobe on sequence 11, image 8. Otherwise, the visualized lungs are clear without large pleural effusions.  Upper Abdomen: Small focus of enhancement in the right hepatic lobe on sequence 10 image 53 is probably an incidental finding. Again noted are postsurgical changes involving the stomach.  Musculoskeletal: Degenerative disc and endplate changes in thoracic spine.  IMPRESSION: No acute abnormalities involving the extracardiac structures.  Postsurgical changes in stomach.  Stable 2 mm nodule in the left lower lobe. No follow-up needed if patient is low-risk. Non-contrast chest CT can be considered in 12 months if patient is high-risk. This recommendation follows the consensus statement: Guidelines for Management of Incidental Pulmonary Nodules Detected on CT Images: From the Fleischner Society 2017; Radiology 2017; 284:228-243.  Electronically Signed: By: Richarda Overlie M.D. On: 01/17/2021 15:09            Recent Labs: 07/17/2022: ALT 30; Magnesium 1.7 07/18/2022: BUN 19; Creatinine, Ser 1.23; Hemoglobin 11.6; Platelets 146; Potassium 3.8; Sodium 139  Recent Lipid Panel    Component Value Date/Time   CHOL 125 07/17/2022 0947   TRIG 25 07/17/2022 0947   HDL 78 07/17/2022 0947   CHOLHDL 1.6 07/17/2022 0947   VLDL 5 07/17/2022 0947   LDLCALC 42 07/17/2022 0947      Risk Assessment/Calculations:                Physical Exam:    VS:  BP 114/60 (BP Location: Left Arm, Patient Position: Sitting,  Cuff Size: Normal)   Pulse 70   Ht 5\' 5"  (1.651 m)   Wt 178 lb 9.6 oz (81 kg)   SpO2 95%   BMI 29.72 kg/m     Wt Readings from Last 3 Encounters:  07/31/22 178 lb 9.6 oz (81 kg)  07/16/22 190 lb (86.2 kg)  03/08/22 186 lb 6.4 oz (84.6 kg)     GEN:  Well nourished, well developed in no acute distress. Sitting upright on the exam table HEENT: Normal NECK: No JVD CARDIAC: RRR, no murmurs, rubs, gallops. Radial pulses 2+ bilaterally  RESPIRATORY:  Clear to auscultation without rales, wheezing or rhonchi. Normal work of breathing on room air  ABDOMEN: Soft, non-tender, non-distended MUSCULOSKELETAL:  No edema in BLE; No deformity  SKIN: Warm and dry NEUROLOGIC:  Alert and oriented x 3 PSYCHIATRIC:  Normal affect   ASSESSMENT:    1. Coronary artery disease involving native coronary artery of native heart with angina pectoris (HCC)   2. Essential hypertension   3. Hyperlipidemia LDL goal <70   4. Hypokalemia   5. Mild mitral valve regurgitation    PLAN:    In order of problems listed above:  CAD  Progressive angina  - Patient has a known history of CAD from cardiac catheterization in 02/2021-- had severe single-vessel disease involving a small caliber and tortuous codominant RCA with multiple segments of 70-90% stenoses. Also had diffuse mild-moderate stenosis throughout the left coronary system. This has all been managed medically  - Reports that for the past several months, she has been having intermittent episodes of chest tightness/pressure. Associated with exertion and relieved by rest. Symptoms have been progressive and becoming more frequent over time  - She was recently admitted to [facility] for evaluation of pleuritic chest pain. hsTn negative x3. Of note, her BP was elevated on arrival to the ED and SBP was almost 200 mmHg.  Echocardiogram from 4/15 showed EF 55-60% with no regional wall motion abnormalities, normal RV systolic function. Patient was managed medically with emphasis on BP control  - Since being discharged from the hospital, patient has continued to have intermittent chest pressure/tightness.  - Discussed with DOD Dr. Herbie Baltimore- He reviewed cath films and does not believe that RCA has targets for PCI. he recommended increasing ranexa to 1000 mg BID. If she continues to have chest tightness and pressure on the increased dose, she will likely need cardiac catheterization. Patient has a follow up in 2 weeks to reassess symptoms  - Continue ASA 81 mg daily and lipitor 80 mg daily  - Continue amlodipine 10 mg daily, carvedilol 6.25 mg BID, irbesartan 300 mg daily, imdur 60 mg daily - Check BMP to monitor renal function and potassium after starting irbesartan   HTN  - BP well controlled on current medications  - She reports rare instances of dizziness upon standing. She reported that this does not bother her very much - Instructed patient to call the office if she has worsening dizziness   HLD  - Lipid panel from 07/17/22 showed LDL 42, HDL 78, triglycerides 25, total cholesterol 125 - Continue lipitor 80 mg daily   Hypokalemia - K was 3.3 on 4/15. She did receive supplementation while admitted. K was 3.8 prior to DC  - Rechecking BMP today   Mild Mitral Valve Regurgitation  - Noted on echocardiogram from 07/2022 - Will need repeat echo in 3-5 years    Medication Adjustments/Labs and Tests Ordered: Current medicines are reviewed at length with the  patient today.  Concerns regarding medicines are outlined above.  Orders Placed This Encounter  Procedures   Basic Metabolic Panel (BMET)   Meds ordered this encounter  Medications   ranolazine (RANEXA) 1000 MG SR tablet    Sig: Take 1 tablet (1,000 mg total) by mouth 2 (two) times daily.    Dispense:  180 tablet    Refill:  3   atorvastatin (LIPITOR)  80 MG tablet    Sig: Take 1 tablet (80 mg total) by mouth at bedtime.    Dispense:  90 tablet    Refill:  3    Patient Instructions  Medication Instructions:  Start taking Ranolazine (Ranexa) 1000 MG Twice daily.  *If you need a refill on your cardiac medications before your next appointment, please call your pharmacy*   Lab Work: Your physician recommends that you have lab work today:  BMP   If you have labs (blood work) drawn today and your tests are completely normal, you will receive your results only by: MyChart Message (if you have MyChart) OR A paper copy in the mail If you have any lab test that is abnormal or we need to change your treatment, we will call you to review the results.  Testing/Procedures: None Ordered  Follow-Up: At [facility], you and your health needs are our priority.  As part of our continuing mission to provide you with exceptional heart care, we have created designated Provider Care Teams.  These Care Teams include your primary Cardiologist (physician) and Advanced Practice Providers (APPs -  Physician Assistants and Nurse Practitioners) who all work together to provide you with the care you need, when you need it.  Your next appointment:   2 week(s)  Provider:   Edd Fabian, FNP or Robet Leu, PA-C     {     Signed, Jonita Albee, PA-C  07/31/2022 5:01 PM    [facility]

## 2022-07-31 ENCOUNTER — Encounter: Payer: Self-pay | Admitting: General Practice

## 2022-07-31 ENCOUNTER — Ambulatory Visit: Payer: Medicare Other | Attending: General Practice | Admitting: Cardiology

## 2022-07-31 VITALS — BP 114/60 | HR 70 | Ht 65.0 in | Wt 178.6 lb

## 2022-07-31 DIAGNOSIS — E876 Hypokalemia: Secondary | ICD-10-CM | POA: Diagnosis not present

## 2022-07-31 DIAGNOSIS — I25119 Atherosclerotic heart disease of native coronary artery with unspecified angina pectoris: Secondary | ICD-10-CM | POA: Diagnosis not present

## 2022-07-31 DIAGNOSIS — I10 Essential (primary) hypertension: Secondary | ICD-10-CM | POA: Insufficient documentation

## 2022-07-31 DIAGNOSIS — I340 Nonrheumatic mitral (valve) insufficiency: Secondary | ICD-10-CM

## 2022-07-31 DIAGNOSIS — E785 Hyperlipidemia, unspecified: Secondary | ICD-10-CM | POA: Diagnosis not present

## 2022-07-31 MED ORDER — ATORVASTATIN CALCIUM 80 MG PO TABS: 80.0000 mg | ORAL_TABLET | Freq: Every day | ORAL | 3 refills | Status: AC

## 2022-07-31 MED ORDER — RANOLAZINE ER 1000 MG PO TB12: 1000.0000 mg | ORAL_TABLET | Freq: Two times a day (BID) | ORAL | 3 refills | Status: DC

## 2022-07-31 NOTE — Patient Instructions (Signed)
Medication Instructions:  Start taking Ranolazine (Ranexa) 1000 MG Twice daily.  *If you need a refill on your cardiac medications before your next appointment, please call your pharmacy*   Lab Work: Your physician recommends that you have lab work today:  BMP   If you have labs (blood work) drawn today and your tests are completely normal, you will receive your results only by: MyChart Message (if you have MyChart) OR A paper copy in the mail If you have any lab test that is abnormal or we need to change your treatment, we will call you to review the results.  Testing/Procedures: None Ordered  Follow-Up: At [facility], you and your health needs are our priority.  As part of our continuing mission to provide you with exceptional heart care, we have created designated Provider Care Teams.  These Care Teams include your primary Cardiologist (physician) and Advanced Practice Providers (APPs -  Physician Assistants and Nurse Practitioners) who all work together to provide you with the care you need, when you need it.  Your next appointment:   2 week(s)  Provider:   Edd Fabian, FNP or Robet Leu, PA-C     {

## 2022-07-31 NOTE — Progress Notes (Signed)
I really think we are stuck with medical management for now, but it is not unreasonable to take another look to see if the something wrong with the left coronary system if angina symptoms cannot be controlled.  Forwarding the pictures to Drs. Cooper and read to see what they think about the RCA.  I personally think that is just not feasible to attempt PCI.  Bryan Lemma, MD

## 2022-08-01 ENCOUNTER — Telehealth: Payer: Self-pay

## 2022-08-01 LAB — BASIC METABOLIC PANEL
BUN/Creatinine Ratio: 24 (ref 12–28)
BUN: 31 mg/dL — ABNORMAL HIGH (ref 8–27)
CO2: 24 mmol/L (ref 20–29)
Calcium: 9.5 mg/dL (ref 8.7–10.3)
Chloride: 102 mmol/L (ref 96–106)
Creatinine, Ser: 1.28 mg/dL — ABNORMAL HIGH (ref 0.57–1.00)
Glucose: 218 mg/dL — ABNORMAL HIGH (ref 70–99)
Potassium: 4.6 mmol/L (ref 3.5–5.2)
Sodium: 139 mmol/L (ref 134–144)
eGFR: 45 mL/min/{1.73_m2} — ABNORMAL LOW (ref >59)

## 2022-08-01 NOTE — Telephone Encounter (Signed)
-----   Message from Jonita Albee, [place] sent at 08/01/2022 12:15 PM EDT ----- Please tell patient that her renal function is stable and her K is within normal limits. Continue current medications and follow up as arranged.   Thanks! KJ

## 2022-08-01 NOTE — Telephone Encounter (Signed)
Left voicemail for patient to return call to office. 

## 2022-08-08 ENCOUNTER — Other Ambulatory Visit: Payer: Self-pay | Admitting: Internal Medicine

## 2022-08-08 ENCOUNTER — Ambulatory Visit
Admission: RE | Admit: 2022-08-08 | Discharge: 2022-08-08 | Disposition: A | Discharge status: Home / Self Care | Payer: Medicare Other | Source: Ambulatory Visit | Attending: Internal Medicine | Admitting: Internal Medicine

## 2022-08-08 DIAGNOSIS — R921 Mammographic calcification found on diagnostic imaging of breast: Secondary | ICD-10-CM

## 2022-08-16 ENCOUNTER — Ambulatory Visit: Payer: Medicare Other | Attending: General Practice | Admitting: Cardiology

## 2022-08-16 ENCOUNTER — Encounter: Payer: Self-pay | Admitting: General Practice

## 2022-08-16 VITALS — BP 112/62 | HR 84 | Ht 65.5 in | Wt 174.8 lb

## 2022-08-16 DIAGNOSIS — E1169 Type 2 diabetes mellitus with other specified complication: Secondary | ICD-10-CM | POA: Diagnosis not present

## 2022-08-16 DIAGNOSIS — E785 Hyperlipidemia, unspecified: Secondary | ICD-10-CM | POA: Insufficient documentation

## 2022-08-16 DIAGNOSIS — R5383 Other fatigue: Secondary | ICD-10-CM | POA: Diagnosis not present

## 2022-08-16 DIAGNOSIS — I2089 Other forms of angina pectoris: Secondary | ICD-10-CM | POA: Insufficient documentation

## 2022-08-16 DIAGNOSIS — I340 Nonrheumatic mitral (valve) insufficiency: Secondary | ICD-10-CM | POA: Insufficient documentation

## 2022-08-16 DIAGNOSIS — R0609 Other forms of dyspnea: Secondary | ICD-10-CM | POA: Insufficient documentation

## 2022-08-16 DIAGNOSIS — Z794 Long term (current) use of insulin: Secondary | ICD-10-CM | POA: Diagnosis not present

## 2022-08-16 DIAGNOSIS — E119 Type 2 diabetes mellitus without complications: Secondary | ICD-10-CM | POA: Insufficient documentation

## 2022-08-16 DIAGNOSIS — I10 Essential (primary) hypertension: Secondary | ICD-10-CM | POA: Insufficient documentation

## 2022-08-16 NOTE — Patient Instructions (Signed)
Medication Instructions:  No Changes *If you need a refill on your cardiac medications before your next appointment, please call your pharmacy*   Lab Work: No Labs If you have labs (blood work) drawn today and your tests are completely normal, you will receive your results only by: MyChart Message (if you have MyChart) OR A paper copy in the mail If you have any lab test that is abnormal or we need to change your treatment, we will call you to review the results.   Testing/Procedures: No Testing   Follow-Up: At [facility], you and your health needs are our priority.  As part of our continuing mission to provide you with exceptional heart care, we have created designated Provider Care Teams.  These Care Teams include your primary Cardiologist (physician) and Advanced Practice Providers (APPs -  Physician Assistants and Nurse Practitioners) who all work together to provide you with the care you need, when you need it.  We recommend signing up for the patient portal called "MyChart".  Sign up information is provided on this After Visit Summary.  MyChart is used to connect with patients for Virtual Visits (Telemedicine).  Patients are able to view lab/test results, encounter notes, upcoming appointments, etc.  Non-urgent messages can be sent to your provider as well.   To learn more about what you can do with MyChart, go to [email or web address].    Your next appointment:   6 month(s)  Provider:   David Harding, MD   

## 2022-08-16 NOTE — Progress Notes (Unsigned)
Cardiology Office Note:    Date:  08/16/2022   ID:  Karen Gardner, DOB 1955-07-09, MRN [id number]  PCP:  [name], MD   [facility] Providers Cardiologist:  Bryan Lemma, MD { Click to update primary MD,subspecialty MD or APP then REFRESH:1}    Referring MD: [name],*   No chief complaint on file.   History of Present Illness:    Karen Gardner is a 67 y.o. female with a hx of CAD, hypertension, hyperlipidemia, type 2 diabetes, CKD stage III, chronic chest pain. Patient is returning today for 2 week follow up of chest pain.   Patient's cardiac history notable for cardiac CT on 01/17/21 following abnormal ECG at PCP. This CT found a calcium score of 2437. FFR suggested possible high grade lesion in mid RCA with possible occlusion. Following this, patient had LHC 02/02/21 that showed moderate to severe diffusely calcified CAD with severe single-vessel disease involving small caliber co-dominant RCA with multiple segments of 70-90% stenoses. There was also diffuse mild-moderate stenosis through the left coronary system. It was felt that there were no targets for intervention and medical management pursued. Patient was admitted to [facility] from 4/14-4/16 with chest pain, hypertension. HS troponin was negative and CTA chest negative for dissection. Echocardiogram with normal LVEF and no regional wall motion abnormalities. Pain did improve with BP management and she was discharged on carvedilol 6.25 mg twice daily, amlodipine 10 mg daily, irbesartan 300 mg daily, Imdur 60 mg daily, aspirin, statin.   Patient saw Robet Leu, PA-C on 4/29 and reported that she was continuing to have intermittent chest tightness with exertion, relieved by rest. Patient's LHC films reviewed that day by Dr. Herbie Baltimore who continued to feel that RCA was without targets for PCI. Decision made to increase Ranexa dosing to 1000mg  BID.   Today patient reports that since  increasing Ranexa dose, she is no longer experiencing chest pain with exertion. She walks her dog [name] every day, between 1/2 and 1 mile. Reports that she feels fatigue and mild shortness of breath after she gets home but says that this is stable/resolves with rest. Patient's primary concern today is daily afternoon fatigue which she says prompts her to take a nap most days. She showed me her glucometer which indicates daily average glucose in recent weeks has been >200. I wonder if hyperglycemia/fluctuant glucose may be driving her fatigue. She is followed by Dr. Sharl Ma and will see him in 2 weeks.  Today patient denies chest pain, lower extremity edema, palpitations, melena, hematuria, hemoptysis, diaphoresis, weakness, presyncope, syncope, orthopnea, and PND.   Past Medical History:  Diagnosis Date   Allergic rhinitis    Anxiety    Arthritis    Asthma    childhood   Chronic low back pain    CKD (chronic kidney disease), stage III (HCC)    However, most recent creatinine 1.5.   Coronary artery disease involving native heart with other form of angina pectoris, unspecified vessel or lesion type [facility]) 11/16/2020   11/16/2020: CORONARY CA++ SCORE: Agatston Score 2245.  LAD 1093, LCx 959, RCA 193 (99th percentile) -> COR CTA (Agatston 2437) -moderate to severe multivessel CAD with significant blooming in all 3 epicardial vessels.  CAD RADS 3-4. FFRCT suggests mid RCA occlusion, => CARDIAC CATH 02/02/21: RCA: prox 70%, Mid-distal 90% & distal 90% (small caliber, Not viable PCI target; Prox RI 50%.   Depression    With anxiety   GERD (gastroesophageal reflux disease)  Headache    sinus headaches    Hypercholesterolemia    Hypertension    Iron deficiency anemia 05/26/2015   Has required transfusions-followed by Dr. Myna Hidalgo   Iron malabsorption 05/26/2015   Menopause    Numbness in both hands    mostly at night   Obesity    Osteoarthritis of both knees    Status post right TKA (and ankle)  with plans for left TKA   Type 2 diabetes mellitus without complication, with long-term current use of insulin (HCC)    Type 2 on insulin pump   Vasovagal syncope 2018   Negative work-up   Vitamin D deficiency     Past Surgical History:  Procedure Laterality Date   COLONOSCOPY     EYE SURGERY Bilateral    [facility]    HERNIA REPAIR     umb hernia as child   KNEE ARTHROSCOPY  02/12/2012   Procedure: ARTHROSCOPY KNEE;  Surgeon: Nestor Lewandowsky, MD;  Location: [facility];  Service: Orthopedics;  Laterality: Right;  Partial Lateral Meniscectomy, Debridement chondromalacia   LAPAROSCOPIC GASTRIC SLEEVE RESECTION N/A 03/14/2016   Procedure: LAPAROSCOPIC GASTRIC SLEEVE RESECTION, WITH REPAIR OF HIATAL HERNIA REPAI, AND UPPER ENDO;  Surgeon: Luretha Murphy, MD;  Location: [facility];  Service: General;  Laterality: N/A;   LEFT HEART CATH AND CORONARY ANGIOGRAPHY N/A 02/02/2021   Procedure: LEFT HEART CATH AND CORONARY ANGIOGRAPHY;  Surgeon: Marykay Lex, MD;  Location: [facility];; Heavily calcified tortuous codominant RCA with proximal 70% stenosis.  Mid to distal RCA 90% stenosis.  Distal RCA 90%.  Not a PCI target.  50% RI, proximal to mid LAD 20%.  Mid to distal LCx 30% with 30% in LP AV-heavily calcified.  EF 55-65%.  Mildly elevated LVEDP.   ORIF ANKLE FRACTURE Right 02/11/2014   Procedure: OPEN REDUCTION INTERNAL FIXATION (ORIF) RIGHT ANKLE FRACTURE;  Surgeon: Nestor Lewandowsky, MD;  Location: [facility];  Service: Orthopedics;  Laterality: Right;   TOTAL KNEE ARTHROPLASTY Right 08/05/2014   Procedure: TOTAL KNEE ARTHROPLASTY;  Surgeon: Gean Birchwood, MD;  Location: [facility];  Service: Orthopedics;  Laterality: Right;   TRANSTHORACIC ECHOCARDIOGRAM  05/2016   EF 60 to 65%.  Severe basal septal LVH with mild concentric LVH.  No R WMA.  GR 1 DD.  Indeterminate filling pressures. Minimal valve disease.   TRANSTHORACIC ECHOCARDIOGRAM  03/29/2021   EF 55 to 60%.  No R WMA-normal strain  pattern.  GR 1 DD.  Normal RV with RV P mild aortic sclerosis but no stenosis no AI.  Mildly elevated RAP.   TRIGGER FINGER RELEASE Right 10/14/2018   Procedure: RELEASE TRIGGER FINGER/A-1 PULLEY;  Surgeon: Betha Loa, MD;  Location: [facility];  Service: Orthopedics;  Laterality: Right;   UPPER GI ENDOSCOPY      Current Medications: Current Meds  Medication Sig   amLODipine (NORVASC) 10 MG tablet Take 1 tablet (10 mg total) by mouth daily.   aspirin EC 81 MG tablet Take 1 tablet (81 mg total) by mouth daily. 30 minutes prior to taking Isosorbide   atorvastatin (LIPITOR) 80 MG tablet Take 1 tablet (80 mg total) by mouth at bedtime.   CALCIUM PO Take 1 capsule by mouth 3 (three) times daily.    carvedilol (COREG) 6.25 MG tablet TAKE 1 TABLET BY MOUTH TWICE DAILY   Continuous Blood Gluc Sensor (FREESTYLE LIBRE 14 DAY SENSOR) MISC Apply topically every 14 (fourteen) days.   Dulaglutide (TRULICITY) 0.75  MG/0.5ML SOPN Inject 0.75 mg into the skin once a week.   escitalopram (LEXAPRO) 10 MG tablet Take 10 mg by mouth at bedtime.    fluticasone (FLONASE) 50 MCG/ACT nasal spray Place 1 spray into both nostrils daily.   glucose blood (ONETOUCH VERIO) test strip Use as instructed to check blood sugar once a day dx code E11.65   insulin lispro (HUMALOG) 100 UNIT/ML KwikPen Inject 8 Units into the skin 3 (three) times daily.   irbesartan (AVAPRO) 300 MG tablet Take 1 tablet (300 mg total) by mouth at bedtime.   isosorbide mononitrate (IMDUR) 60 MG 24 hr tablet Take 1 tablet (60 mg total) by mouth daily.   lansoprazole (PREVACID) 15 MG capsule Take 15 mg by mouth at bedtime.    latanoprost (XALATAN) 0.005 % ophthalmic solution 1 drop at bedtime.   metFORMIN (GLUCOPHAGE) 500 MG tablet Take 500 mg by mouth at bedtime.   Multiple Vitamin (MULTIVITAMIN WITH MINERALS) TABS tablet Take 1 tablet by mouth 2 (two) times daily.   nitroGLYCERIN (NITROSTAT) 0.4 MG SL tablet Place 1 tablet (0.4 mg  total) under the tongue every 5 (five) minutes as needed for chest pain.   ondansetron (ZOFRAN) 4 MG tablet Take 1 tablet (4 mg total) by mouth every 6 (six) hours as needed for nausea.   ranolazine (RANEXA) 1000 MG SR tablet Take 1 tablet (1,000 mg total) by mouth 2 (two) times daily.   traZODone (DESYREL) 50 MG tablet Take 0.5 tablets (25 mg total) by mouth at bedtime as needed for sleep.     Allergies:   Codeine, Lactose intolerance (gi), and Oatmeal   Social History   Socioeconomic History   Marital status: Single    Spouse name: Not on file   Number of children: Not on file   Years of education: Not on file   Highest education level: Bachelor's degree (e.g., BA, AB, BS)  Occupational History   Occupation: -Futures trader: [employer]    Comment: [employer]  Tobacco Use   Smoking status: Former    Packs/day: 0.25    Years: 1.00    Additional pack years: 0.00    Total pack years: 0.25    Types: Cigarettes    Quit date: 02/08/1972    Years since quitting: 50.5   Smokeless tobacco: Never  Substance and Sexual Activity   Alcohol use: No    Alcohol/week: 0.0 standard drinks of alcohol   Drug use: No   Sexual activity: Not Currently  Other Topics Concern   Not on file  Social History Narrative   She is single, 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  Previously worked as a Education officer, environmental.   She is single, lives alone.  Used to exercise by walking frequently, but with her knee arthritis, not walking as much.   Social Determinants of Health   Financial Resource Strain: Not on file  Food Insecurity: No Food Insecurity (07/16/2022)   Hunger Vital Sign    Worried About Running Out of Food in the Last Year: Never true    Ran Out of Food in the Last Year: Never true  Transportation Needs: No Transportation Needs (07/16/2022)   PRAPARE - Administrator, Civil Service (Medical): No    Lack of Transportation (Non-Medical):  No  Physical Activity: Not on file  Stress: Not on file  Social Connections: Not on file     Family History: The patient's family  history includes Alcohol abuse in her father; Heart attack (age of onset: 3) in her sister.  ROS:   Please see the history of present illness.    All other systems reviewed and are negative.  EKGs/Labs/Other Studies Reviewed:    EKG:  EKG is not ordered today.   Recent Labs: 07/17/2022: ALT 30; Magnesium 1.7 07/18/2022: Hemoglobin 11.6; Platelets 146 07/31/2022: BUN 31; Creatinine, Ser 1.28; Potassium 4.6; Sodium 139  Recent Lipid Panel    Component Value Date/Time   CHOL 125 07/17/2022 0947   TRIG 25 07/17/2022 0947   HDL 78 07/17/2022 0947   CHOLHDL 1.6 07/17/2022 0947   VLDL 5 07/17/2022 0947   LDLCALC 42 07/17/2022 0947     Risk Assessment/Calculations:                Physical Exam:    VS:  BP 112/62   Pulse 84   Ht 5' 5.5" (1.664 m)   Wt 174 lb 12.8 oz (79.3 kg)   SpO2 96%   BMI 28.65 kg/m     Wt Readings from Last 3 Encounters:  08/16/22 174 lb 12.8 oz (79.3 kg)  07/31/22 178 lb 9.6 oz (81 kg)  07/16/22 190 lb (86.2 kg)     GEN:  Well nourished, well developed in no acute distress HEENT: Normal NECK: No JVD; No carotid bruits LYMPHATICS: No lymphadenopathy CARDIAC: RRR, no murmurs, rubs, gallops RESPIRATORY:  Clear to auscultation without rales, wheezing or rhonchi  ABDOMEN: Soft, non-tender, non-distended MUSCULOSKELETAL:  No edema; No deformity  SKIN: Warm and dry NEUROLOGIC:  Alert and oriented x 3 PSYCHIATRIC:  Normal affect   ASSESSMENT:    1. Essential hypertension   2. Chronic stable angina   3. Hyperlipidemia associated with type 2 diabetes mellitus (HCC)   4. Type 2 diabetes mellitus without complication, with long-term current use of insulin (HCC)   5. DOE (dyspnea on exertion)   6. Other fatigue   7. Mild mitral valve regurgitation    PLAN:    In order of problems listed  above:  Hypertension Blood pressure is well controlled, 112/62 today. Continue Amlodipine 10mg , Coreg 6.25mg , Irbesartan 300mg .  Stable angina Dyspnea on exertion Patient reports no recurrent exertional angina since Ranexa was increased to 1000mg  BID. She is tolerating exertion with stable/predictable mild shortness of breath. Will continue Ranexa 1000mg  BID, with Coreg 6.25 BID, Imdur 60mg  daily, PRN nitroglycerin. Continue ASA 81mg .   Hyperlipidemia LDL 42, HDL 78, triglycerides 25, total cholesterol 125. Continue Atorvastatin 80mg .  Chronic fatigue  Patient reporting chronic fatigue, needs to take a nap most afternoons. Suspect that this may be hyperglycemia related as patients glucometer shows average glucose typically >200. Patient to follow up with Dr. Sharl Ma in the next few weeks.  DM type II  As above, suspect that increased glucose could be contributing to chronic fatigue. Patient has scheduled follow up with Dr. Sharl Ma.  Mild mitral valve regurgitation  Mild MR noted on April TTE. No symptoms. Will continue to monitor.            Medication Adjustments/Labs and Tests Ordered: Current medicines are reviewed at length with the patient today.  Concerns regarding medicines are outlined above.  No orders of the defined types were placed in this encounter.  No orders of the defined types were placed in this encounter.   Patient Instructions  Medication Instructions:  No Changes *If you need a refill on your cardiac medications before your next appointment, please call your  pharmacy*   Lab Work: No Labs If you have labs (blood work) drawn today and your tests are completely normal, you will receive your results only by: MyChart Message (if you have MyChart) OR A paper copy in the mail If you have any lab test that is abnormal or we need to change your treatment, we will call you to review the results.   Testing/Procedures: No Testing   Follow-Up: At [facility], you and your health needs are our priority.  As part of our continuing mission to provide you with exceptional heart care, we have created designated Provider Care Teams.  These Care Teams include your primary Cardiologist (physician) and Advanced Practice Providers (APPs -  Physician Assistants and Nurse Practitioners) who all work together to provide you with the care you need, when you need it.  We recommend signing up for the patient portal called "MyChart".  Sign up information is provided on this After Visit Summary.  MyChart is used to connect with patients for Virtual Visits (Telemedicine).  Patients are able to view lab/test results, encounter notes, upcoming appointments, etc.  Non-urgent messages can be sent to your provider as well.   To learn more about what you can do with MyChart, go to [email or web address].    Your next appointment:   6 month(s)  Provider:   Bryan Lemma, MD     Signed, Perlie Gold, PA-C  08/16/2022 12:53 PM    [facility]

## 2022-08-24 DIAGNOSIS — H401132 Primary open-angle glaucoma, bilateral, moderate stage: Secondary | ICD-10-CM | POA: Diagnosis not present

## 2022-08-31 DIAGNOSIS — R059 Cough, unspecified: Secondary | ICD-10-CM | POA: Diagnosis not present

## 2022-08-31 DIAGNOSIS — J40 Bronchitis, not specified as acute or chronic: Secondary | ICD-10-CM | POA: Diagnosis not present

## 2022-10-03 ENCOUNTER — Other Ambulatory Visit: Payer: Self-pay | Admitting: Cardiology

## 2022-10-03 DIAGNOSIS — E113293 Type 2 diabetes mellitus with mild nonproliferative diabetic retinopathy without macular edema, bilateral: Secondary | ICD-10-CM | POA: Diagnosis not present

## 2022-10-03 DIAGNOSIS — H401132 Primary open-angle glaucoma, bilateral, moderate stage: Secondary | ICD-10-CM | POA: Diagnosis not present

## 2022-10-03 DIAGNOSIS — R54 Age-related physical debility: Secondary | ICD-10-CM | POA: Diagnosis not present

## 2022-10-03 DIAGNOSIS — J301 Allergic rhinitis due to pollen: Secondary | ICD-10-CM | POA: Diagnosis not present

## 2022-10-03 DIAGNOSIS — Z1331 Encounter for screening for depression: Secondary | ICD-10-CM | POA: Diagnosis not present

## 2022-10-03 DIAGNOSIS — F3341 Major depressive disorder, recurrent, in partial remission: Secondary | ICD-10-CM | POA: Diagnosis not present

## 2022-10-03 DIAGNOSIS — I25118 Atherosclerotic heart disease of native coronary artery with other forms of angina pectoris: Secondary | ICD-10-CM | POA: Diagnosis not present

## 2022-10-03 DIAGNOSIS — Z794 Long term (current) use of insulin: Secondary | ICD-10-CM | POA: Diagnosis not present

## 2022-10-03 DIAGNOSIS — D508 Other iron deficiency anemias: Secondary | ICD-10-CM | POA: Diagnosis not present

## 2022-10-03 DIAGNOSIS — M19049 Primary osteoarthritis, unspecified hand: Secondary | ICD-10-CM | POA: Diagnosis not present

## 2022-10-03 DIAGNOSIS — Z0000 Encounter for general adult medical examination without abnormal findings: Secondary | ICD-10-CM | POA: Diagnosis not present

## 2022-10-03 DIAGNOSIS — Z9884 Bariatric surgery status: Secondary | ICD-10-CM | POA: Diagnosis not present

## 2022-10-03 DIAGNOSIS — E113593 Type 2 diabetes mellitus with proliferative diabetic retinopathy without macular edema, bilateral: Secondary | ICD-10-CM | POA: Diagnosis not present

## 2022-10-12 ENCOUNTER — Ambulatory Visit: Payer: Medicare Other | Attending: Nurse Practitioner | Admitting: Nurse Practitioner

## 2022-10-12 ENCOUNTER — Encounter: Payer: Self-pay | Admitting: Nurse Practitioner

## 2022-10-12 VITALS — BP 132/68 | HR 77 | Ht 65.5 in | Wt 176.2 lb

## 2022-10-12 DIAGNOSIS — I340 Nonrheumatic mitral (valve) insufficiency: Secondary | ICD-10-CM | POA: Diagnosis not present

## 2022-10-12 DIAGNOSIS — Z794 Long term (current) use of insulin: Secondary | ICD-10-CM | POA: Insufficient documentation

## 2022-10-12 DIAGNOSIS — I10 Essential (primary) hypertension: Secondary | ICD-10-CM | POA: Insufficient documentation

## 2022-10-12 DIAGNOSIS — E119 Type 2 diabetes mellitus without complications: Secondary | ICD-10-CM | POA: Diagnosis not present

## 2022-10-12 DIAGNOSIS — E785 Hyperlipidemia, unspecified: Secondary | ICD-10-CM | POA: Insufficient documentation

## 2022-10-12 DIAGNOSIS — N183 Chronic kidney disease, stage 3 unspecified: Secondary | ICD-10-CM | POA: Insufficient documentation

## 2022-10-12 DIAGNOSIS — I25118 Atherosclerotic heart disease of native coronary artery with other forms of angina pectoris: Secondary | ICD-10-CM | POA: Diagnosis not present

## 2022-10-12 MED ORDER — ISOSORBIDE MONONITRATE ER 30 MG PO TB24: 30.0000 mg | ORAL_TABLET | Freq: Every day | ORAL | 3 refills | Status: DC

## 2022-10-12 NOTE — Progress Notes (Signed)
Office Visit    Patient Name: Karen Gardner Date of Encounter: 10/12/2022  Primary Care Provider:  [name], MD Primary Cardiologist:  Bryan Lemma, MD  Chief Complaint    67 year old female with a history of CAD with chronic chest pain, mild MR, hypertension, hyperlipidemia, CKD stage IIIa, and type 2 diabetes who presents for follow-up related to CAD and chest pain.  Past Medical History    Past Medical History:  Diagnosis Date   Allergic rhinitis    Anxiety    Arthritis    Asthma    childhood   Chronic low back pain    CKD (chronic kidney disease), stage III (HCC)    However, most recent creatinine 1.5.   Coronary artery disease involving native heart with other form of angina pectoris, unspecified vessel or lesion type [facility]) 11/16/2020   11/16/2020: CORONARY CA++ SCORE: Agatston Score 2245.  LAD 1093, LCx 959, RCA 193 (99th percentile) -> COR CTA (Agatston 2437) -moderate to severe multivessel CAD with significant blooming in all 3 epicardial vessels.  CAD RADS 3-4. FFRCT suggests mid RCA occlusion, => CARDIAC CATH 02/02/21: RCA: prox 70%, Mid-distal 90% & distal 90% (small caliber, Not viable PCI target; Prox RI 50%.   Depression    With anxiety   GERD (gastroesophageal reflux disease)    Headache    sinus headaches    Hypercholesterolemia    Hypertension    Iron deficiency anemia 05/26/2015   Has required transfusions-followed by Dr. Myna Hidalgo   Iron malabsorption 05/26/2015   Menopause    Numbness in both hands    mostly at night   Obesity    Osteoarthritis of both knees    Status post right TKA (and ankle) with plans for left TKA   Type 2 diabetes mellitus without complication, with long-term current use of insulin (HCC)    Type 2 on insulin pump   Vasovagal syncope 2018   Negative work-up   Vitamin D deficiency    Past Surgical History:  Procedure Laterality Date   COLONOSCOPY     EYE SURGERY Bilateral    [facility]    HERNIA REPAIR     umb  hernia as child   KNEE ARTHROSCOPY  02/12/2012   Procedure: ARTHROSCOPY KNEE;  Surgeon: Nestor Lewandowsky, MD;  Location: [facility];  Service: Orthopedics;  Laterality: Right;  Partial Lateral Meniscectomy, Debridement chondromalacia   LAPAROSCOPIC GASTRIC SLEEVE RESECTION N/A 03/14/2016   Procedure: LAPAROSCOPIC GASTRIC SLEEVE RESECTION, WITH REPAIR OF HIATAL HERNIA REPAI, AND UPPER ENDO;  Surgeon: Luretha Murphy, MD;  Location: [facility];  Service: General;  Laterality: N/A;   LEFT HEART CATH AND CORONARY ANGIOGRAPHY N/A 02/02/2021   Procedure: LEFT HEART CATH AND CORONARY ANGIOGRAPHY;  Surgeon: Marykay Lex, MD;  Location: [facility];; Heavily calcified tortuous codominant RCA with proximal 70% stenosis.  Mid to distal RCA 90% stenosis.  Distal RCA 90%.  Not a PCI target.  50% RI, proximal to mid LAD 20%.  Mid to distal LCx 30% with 30% in LP AV-heavily calcified.  EF 55-65%.  Mildly elevated LVEDP.   ORIF ANKLE FRACTURE Right 02/11/2014   Procedure: OPEN REDUCTION INTERNAL FIXATION (ORIF) RIGHT ANKLE FRACTURE;  Surgeon: Nestor Lewandowsky, MD;  Location: [facility];  Service: Orthopedics;  Laterality: Right;   TOTAL KNEE ARTHROPLASTY Right 08/05/2014   Procedure: TOTAL KNEE ARTHROPLASTY;  Surgeon: Gean Birchwood, MD;  Location: [facility];  Service: Orthopedics;  Laterality: Right;   TRANSTHORACIC ECHOCARDIOGRAM  05/2016   EF 60 to 65%.  Severe basal septal LVH with mild concentric LVH.  No R WMA.  GR 1 DD.  Indeterminate filling pressures. Minimal valve disease.   TRANSTHORACIC ECHOCARDIOGRAM  03/29/2021   EF 55 to 60%.  No R WMA-normal strain pattern.  GR 1 DD.  Normal RV with RV P mild aortic sclerosis but no stenosis no AI.  Mildly elevated RAP.   TRIGGER FINGER RELEASE Right 10/14/2018   Procedure: RELEASE TRIGGER FINGER/A-1 PULLEY;  Surgeon: Betha Loa, MD;  Location: [facility];  Service: Orthopedics;  Laterality: Right;   UPPER GI ENDOSCOPY       Allergies  Allergies  Allergen Reactions   Codeine Diarrhea   Lactose Intolerance (Gi) Diarrhea and Other (See Comments)    *Gas*    Oatmeal Other (See Comments)    "Gas"      Labs/Other Studies Reviewed    The following studies were reviewed today:  Cardiac Studies & Procedures   CARDIAC CATHETERIZATION  CARDIAC CATHETERIZATION 02/02/2021  Narrative   Ramus lesion is 50% stenosed. Prox LAD to Mid LAD lesion is 20% stenosed.   Heavily calcified, tortuous "codominant" RCA: Prox RCA lesion is 70% stenosed.  Mid RCA to Dist RCA lesion is 90% stenosed.  Dist RCA lesion is 90% stenosed. ->  Not PCI target   Mid Cx to Dist Cx lesion is 30% stenosed with 35% stenosed side branch in LPAV.  (Heavily calcified)   ------------------------------------------   The left ventricular systolic function is normal. The left ventricular ejection fraction is 55-65% by visual estimate.   LV end diastolic pressure is mildly elevated.   There is no aortic valve stenosis.  SUMMARY Moderate-severe diffusely calcified coronary arteries with severe single-vessel disease involving a small caliber codominant RCA with multiple segments of 70 to 90% stenoses-best treated medically. Diffuse mild to moderate stenosis throughout the left coronary system-most notably 50% Ramus Intermedius. Mildly elevated LVEDP with systemic hypertension. Very tortuous Left Subclavian-Innominate Artery system -> FOR FUTURE CARDIAC CATHETERIZATIONS, WOULD RECOMMEND FEMORAL ACCESS DUE TO VERY DIFFICULT MANIPULATION OF CATHETERS, AND ARTERIAL SPASM.   RECOMMENDATIONS Aggressive risk factor modification with lipid, glycemic and blood pressure management Daily aspirin Add carvedilol 3.125 mg daily with plans to titrate up further in outpatient setting. Hold metformin 48 hours post cath  Bryan Lemma, MD  Findings Coronary Findings Diagnostic  Dominance: Co-dominant  Left Main Vessel is large.  Left Anterior  Descending Vessel is large. The vessel exhibits minimal luminal irregularities. The vessel is moderately calcified. The vessel is tortuous. Prox LAD to Mid LAD lesion is 20% stenosed. The lesion is segmental and eccentric.  First Diagonal Branch Vessel is small in size. The vessel exhibits minimal luminal irregularities.  Ramus Intermedius Vessel is moderate in size There is mild diffuse disease throughout the vessel. There is moderate focal disease in the vessel. The vessel is calcified. Ramus lesion is 50% stenosed. The lesion is concentric. The lesion is moderately calcified.  Left Circumflex Vessel is large. There is mild diffuse disease throughout the vessel. There is mild focal disease in the vessel. The vessel is calcified. The vessel is tortuous. Mid Cx to Dist Cx lesion is 30% stenosed with 35% stenosed side branch in LPAV. The lesion is eccentric. The lesion is moderately calcified.  First Obtuse Marginal Branch Vessel is small in size.  First Left Posterolateral Branch Vessel is moderate in size.  Left Posterior Atrioventricular Artery Vessel is large in size.  Right Coronary  Artery Vessel was injected. Vessel is small. Tapers from what looks like a moderate caliber vessel to very small caliber/diminutive &quot;codominant &quot;RCA -&gt; extensively tortuous and calcified.  Not PCI target There is moderate diffuse disease throughout the vessel. The vessel is severely calcified. The vessel is moderately tortuous. Prox RCA lesion is 70% stenosed. The lesion is focal, discrete and concentric. The lesion is severely calcified. Mid RCA to Dist RCA lesion is 90% stenosed. The lesion is located at the bend, segmental, eccentric and irregular. The lesion is severely calcified. Dist RCA lesion is 90% stenosed. The lesion is focal, discrete, eccentric and irregular. The lesion is moderately calcified.  Acute Marginal Branch Vessel is small in size.  Right Posterior Descending  Artery Vessel is small in size.  Intervention  No interventions have been documented.     ECHOCARDIOGRAM  ECHOCARDIOGRAM COMPLETE 07/17/2022  Narrative ECHOCARDIOGRAM REPORT    Patient Name:   MERRICK FEUTZ Date of Exam: 07/17/2022 Medical Rec #:  [id number]        Height:       65.0 in Accession #:    [id number]       Weight:       190.0 lb Date of Birth:  1955/08/01        BSA:          1.935 m Patient Age:    67 years         BP:           162/84 mmHg Patient Gender: F                HR:           68 bpm. Exam Location:  Inpatient  Procedure: 2D Echo, Cardiac Doppler and Color Doppler  Indications:    chest pain  History:        Patient has prior history of Echocardiogram examinations, most recent 03/29/2021. CAD; Risk Factors:Hypertension, Dyslipidemia and Diabetes.  Sonographer:    Delcie Roch RDCS Referring Phys: [id number] CURTIS J WOODS  IMPRESSIONS   1. Left ventricular ejection fraction, by estimation, is 55 to 60%. The left ventricle has normal function. The left ventricle has no regional wall motion abnormalities. There is mild concentric left ventricular hypertrophy. Left ventricular diastolic parameters are indeterminate. 2. Right ventricular systolic function is normal. The right ventricular size is normal. There is normal pulmonary artery systolic pressure. 3. The mitral valve is normal in structure. Mild mitral valve regurgitation. No evidence of mitral stenosis. 4. The aortic valve is normal in structure. Aortic valve regurgitation is not visualized. Aortic valve sclerosis/calcification is present, without any evidence of aortic stenosis. 5. The inferior vena cava is normal in size with greater than 50% respiratory variability, suggesting right atrial pressure of 3 mmHg.  FINDINGS Left Ventricle: Left ventricular ejection fraction, by estimation, is 55 to 60%. The left ventricle has normal function. The left ventricle has no regional wall motion  abnormalities. The left ventricular internal cavity size was normal in size. There is mild concentric left ventricular hypertrophy. Left ventricular diastolic parameters are indeterminate.  Right Ventricle: The right ventricular size is normal. No increase in right ventricular wall thickness. Right ventricular systolic function is normal. There is normal pulmonary artery systolic pressure. The tricuspid regurgitant velocity is 2.52 m/s, and with an assumed right atrial pressure of 3 mmHg, the estimated right ventricular systolic pressure is 28.4 mmHg.  Left Atrium: Left atrial size was normal in size.  Right Atrium: Right atrial  size was normal in size.  Pericardium: Trivial pericardial effusion is present. The pericardial effusion is circumferential.  Mitral Valve: The mitral valve is normal in structure. Mild mitral valve regurgitation. No evidence of mitral valve stenosis.  Tricuspid Valve: The tricuspid valve is normal in structure. Tricuspid valve regurgitation is mild . No evidence of tricuspid stenosis.  Aortic Valve: The aortic valve is normal in structure. Aortic valve regurgitation is not visualized. Aortic valve sclerosis/calcification is present, without any evidence of aortic stenosis.  Pulmonic Valve: The pulmonic valve was normal in structure. Pulmonic valve regurgitation is trivial. No evidence of pulmonic stenosis.  Aorta: The aortic root is normal in size and structure.  Venous: The inferior vena cava is normal in size with greater than 50% respiratory variability, suggesting right atrial pressure of 3 mmHg.  IAS/Shunts: No atrial level shunt detected by color flow Doppler.   LEFT VENTRICLE PLAX 2D LVIDd:         4.40 cm   Diastology LVIDs:         3.00 cm   LV e' medial:    5.44 cm/s LV PW:         0.90 cm   LV E/e' medial:  16.3 LV IVS:        1.20 cm   LV e' lateral:   8.59 cm/s LVOT diam:     1.90 cm   LV E/e' lateral: 10.3 LV SV:         80 LV SV Index:    41 LVOT Area:     2.84 cm   RIGHT VENTRICLE             IVC RV Basal diam:  2.80 cm     IVC diam: 2.00 cm RV S prime:     12.40 cm/s TAPSE (M-mode): 1.9 cm  LEFT ATRIUM             Index        RIGHT ATRIUM           Index LA diam:        3.90 cm 2.02 cm/m   RA Area:     13.90 cm LA Vol (A2C):   43.5 ml 22.48 ml/m  RA Volume:   34.00 ml  17.57 ml/m LA Vol (A4C):   46.3 ml 23.92 ml/m LA Biplane Vol: 47.8 ml 24.70 ml/m AORTIC VALVE LVOT Vmax:   116.00 cm/s LVOT Vmean:  77.100 cm/s LVOT VTI:    0.283 m  AORTA Ao Root diam: 2.80 cm Ao Asc diam:  3.40 cm  MITRAL VALVE               TRICUSPID VALVE MV Area (PHT): 3.27 cm    TR Peak grad:   25.4 mmHg MV Decel Time: 232 msec    TR Vmax:        252.00 cm/s MV E velocity: 88.60 cm/s MV A velocity: 98.80 cm/s  SHUNTS MV E/A ratio:  0.90        Systemic VTI:  0.28 m Systemic Diam: 1.90 cm  Kardie Tobb DO Electronically signed by Thomasene Ripple DO Signature Date/Time: 07/17/2022/4:32:56 PM    Final    MONITORS  CARDIAC EVENT MONITOR 05/25/2016  Narrative NSR, rare ectopy.  No sustained arrhythmias.   CT SCANS  CT CORONARY MORPH W/CTA COR W/SCORE 01/17/2021  Addendum 01/17/2021  9:20 PM ADDENDUM REPORT: 01/17/2021 21:18  CLINICAL DATA:  Chest pain  EXAM: Cardiac/Coronary CTA  TECHNIQUE: A non-contrast, gated  CT scan was obtained with axial slices of 3 mm through the heart for calcium scoring. Calcium scoring was performed using the Agatston method. A 120 kV prospective, gated, contrast cardiac scan was obtained. Gantry rotation speed was 250 msecs and collimation was 0.6 mm. Two sublingual nitroglycerin tablets (0.8 mg) were given. The 3D data set was reconstructed in 5% intervals of the 35-75% of the R-R cycle. Diastolic phases were analyzed on a dedicated workstation using MPR, MIP, and VRT modes. The patient received 95 cc of contrast.  FINDINGS: Image quality: Excellent.  Noise artifact is:  Limited.  Coronary Arteries:  Normal coronary origin.  Right dominance.  Left main: The left main is a large caliber vessel with a normal take off from the left coronary cusp that bifurcates to form a left anterior descending artery and a left circumflex artery. There is no plaque or stenosis.  Left anterior descending artery: The LAD gives off 2 patent diagonal branches. The proximal LAD is heavily calcified with at least moderate mixed plaque but possibly severe plaque with stenosis of at least 50-69% but possibly > 70% with high risk features. There is mild calcified plaque in the mid LAD with associated stenosis of 25-49%. The D1 is not well defined but appears to have at least a moderate calcified plaque with stenosis 50-60%. There is significant blooming artifact so this may be overestimated. There is mild calcified plaque in the proximal D2 with associated stenosis of 25-49%.  Left circumflex artery: The LCX is non-dominant and gives off 2 patent obtuse marginal branches. There proximal to mid LCx is heavily calcified with circumferential calcification. There is at least moderate calcified plaque in the proximal and mid LCx with associated stenosis of 50-69% but may be > 70%. There is significant blooming artifact.  Right coronary artery: The RCA is dominant with normal take off from the right coronary cusp. The RCA terminates as a PDA and right posterolateral branch. There is moderate calcified plaque in the proximal RCA with associated stenosis of 50-69%. This is followed by moderate mixed plaque with associated stenosis of 50-69%. There is mild soft plaque in the distal RCA with associated stenosis of 50-69%.  Right Atrium: Right atrial size is within normal limits.  Right Ventricle: The right ventricular cavity is within normal limits.  Left Atrium: Left atrial size is normal in size with no left atrial appendage filling defect.  Left Ventricle: The ventricular  cavity size is within normal limits. There are no stigmata of prior infarction. There is no abnormal filling defect.  Pulmonary arteries: Normal in size without proximal filling defect.  Pulmonary veins: Normal pulmonary venous drainage.  Pericardium: Normal thickness with no significant effusion or calcium present.  Cardiac valves: The aortic valve is trileaflet without significant calcification. The mitral valve is normal structure without significant calcification.  Aorta: Normal caliber with no significant disease.  Extra-cardiac findings: See attached radiology report for non-cardiac structures.  IMPRESSION: 1. Coronary calcium score of 2437. This was 99th percentile for age-, sex, and race-matched controls.  2.  Normal coronary origin with right dominance.  3. Moderate atherosclerosis but possibly severe. There is significant blooming artifact in all 3 epicardial vessels which may overestimate degree of stenosis. CAD RADS 3 but possibly 4.  4.  Consider Cardiac catheterization.  5.  Study has been submitted for FFR analysis.  RECOMMENDATIONS: 1. CAD-RADS 0: No evidence of CAD (0%). Consider non-atherosclerotic causes of chest pain.  2. CAD-RADS 1: Minimal non-obstructive CAD (0-24%). Consider  non-atherosclerotic causes of chest pain. Consider preventive therapy and risk factor modification.  3. CAD-RADS 2: Mild non-obstructive CAD (25-49%). Consider non-atherosclerotic causes of chest pain. Consider preventive therapy and risk factor modification.  4. CAD-RADS 3: Moderate stenosis. Consider symptom-guided anti-ischemic pharmacotherapy as well as risk factor modification per guideline directed care. Additional analysis with CT FFR will be submitted.  5. CAD-RADS 4: Severe stenosis. (70-99% or > 50% left main). Cardiac catheterization or CT FFR is recommended. Consider symptom-guided anti-ischemic pharmacotherapy as well as risk factor modification per  guideline directed care. Invasive coronary angiography recommended with revascularization per published guideline statements.  6. CAD-RADS 5: Total coronary occlusion (100%). Consider cardiac catheterization or viability assessment. Consider symptom-guided anti-ischemic pharmacotherapy as well as risk factor modification per guideline directed care.  7. CAD-RADS N: Non-diagnostic study. Obstructive CAD can't be excluded. Alternative evaluation is recommended.  Armanda Magic, MD   Electronically Signed By: Armanda Magic M.D. On: 01/17/2021 21:18  Narrative EXAM: OVER-READ INTERPRETATION  CT CHEST  The following report is an over-read performed by radiologist Dr. Richarda Overlie of [facility] on 01/17/2021. This over-read does not include interpretation of cardiac or coronary anatomy or pathology. The coronary calcium score/coronary CTA interpretation by the cardiologist is attached.  COMPARISON:  11/16/2020  FINDINGS: Vascular: Normal caliber of the visualized thoracic aorta. Limited evaluation of the pulmonary arteries.  Mediastinum/Nodes: Evidence for a hiatal hernia associated with previous gastric surgery.  Lungs/Pleura: No pleural effusions. Few subtle densities along the periphery of the right lower lobe appear to be new and favor atelectasis. Again noted is a 2 mm nodule in the left lower lobe on sequence 11, image 8. Otherwise, the visualized lungs are clear without large pleural effusions.  Upper Abdomen: Small focus of enhancement in the right hepatic lobe on sequence 10 image 53 is probably an incidental finding. Again noted are postsurgical changes involving the stomach.  Musculoskeletal: Degenerative disc and endplate changes in thoracic spine.  IMPRESSION: No acute abnormalities involving the extracardiac structures.  Postsurgical changes in stomach.  Stable 2 mm nodule in the left lower lobe. No follow-up needed if patient is low-risk.  Non-contrast chest CT can be considered in 12 months if patient is high-risk. This recommendation follows the consensus statement: Guidelines for Management of Incidental Pulmonary Nodules Detected on CT Images: From the Fleischner Society 2017; Radiology 2017; 284:228-243.  Electronically Signed: By: Richarda Overlie M.D. On: 01/17/2021 15:09         Recent Labs: 07/17/2022: ALT 30; Magnesium 1.7 07/18/2022: Hemoglobin 11.6; Platelets 146 07/31/2022: BUN 31; Creatinine, Ser 1.28; Potassium 4.6; Sodium 139  Recent Lipid Panel    Component Value Date/Time   CHOL 125 07/17/2022 0947   TRIG 25 07/17/2022 0947   HDL 78 07/17/2022 0947   CHOLHDL 1.6 07/17/2022 0947   VLDL 5 07/17/2022 0947   LDLCALC 42 07/17/2022 0947    History of Present Illness    67 year old female with the above past medical history including CAD with chronic chest pain, mild MR, hypertension, hyperlipidemia, CKD stage IIIa, and type 2 diabetes,  She has a history of CAD.  Coronary CT in 2022 revealed coronary calcium score of 2437 (99th percentile).  She eventually underwent cardiac catheterization November 2022 that showed severe single-vessel disease involving a codominant RCA with multiple segments of 70 to 90% stenosis, diffuse mild to moderate stenosis throughout the left coronary system, most notably 50% ramus, managed medically due to not being considered suitable for PCI.  Echocardiogram  in December 2022 showed EF 55 to 60%, no RWMA, G1 DD. She was hospitalized in April 2024 in the setting of chest pain.  Troponin was negative.  CTA chest was negative for PE or dissection.  Repeat echocardiogram showed normal LVEF, no RWMA, mild MR.  She was discharged home in stable condition though she continued to note intermittent chest tightness with exertion.  Patient's cath films were reviewed by Dr. Herbie Baltimore who felt that RCA is without targets for PCI. Ranexa was increased to 1000 mg twice daily.  She was last seen in the office  on 08/16/2022 and was stable from a cardiac standpoint.  She denied any further chest pain.  She did note some generalized fatigue.  It was felt that her fatigue was possibly related to elevated blood glucose levels.   She presents today for follow-up.  Since her last visit she has been stable overall from a cardiac standpoint. She retired in June 2024. She continues to note intermittent chest tightness, particularly at rest. She has taken nitroglycerin with some relief of symptoms.   She denies exertional chest discomfort, though she does note occasional mild dyspnea on exertion. She remains active and walks for approximately 1 hour every day.  She is frustrated that she continues to experience chest pain.  Home Medications    Current Outpatient Medications  Medication Sig Dispense Refill   amLODipine (NORVASC) 10 MG tablet Take 1 tablet (10 mg total) by mouth daily. 30 tablet 0   aspirin EC 81 MG tablet Take 1 tablet (81 mg total) by mouth daily. 30 minutes prior to taking Isosorbide 90 tablet 3   atorvastatin (LIPITOR) 80 MG tablet Take 1 tablet (80 mg total) by mouth at bedtime. 90 tablet 3   CALCIUM PO Take 1 capsule by mouth 3 (three) times daily.      carvedilol (COREG) 6.25 MG tablet TAKE 1 TABLET BY MOUTH TWICE DAILY 180 tablet 0   Continuous Blood Gluc Sensor (FREESTYLE LIBRE 14 DAY SENSOR) MISC Apply topically every 14 (fourteen) days.     Dulaglutide (TRULICITY) 0.75 MG/0.5ML SOPN Inject 0.75 mg into the skin once a week.     escitalopram (LEXAPRO) 10 MG tablet Take 10 mg by mouth at bedtime.      fluticasone (FLONASE) 50 MCG/ACT nasal spray Place 1 spray into both nostrils daily.     glucose blood (ONETOUCH VERIO) test strip Use as instructed to check blood sugar once a day dx code E11.65 50 each 2   insulin lispro (HUMALOG) 100 UNIT/ML KwikPen Inject 8 Units into the skin 3 (three) times daily.     irbesartan (AVAPRO) 300 MG tablet Take 1 tablet (300 mg total) by mouth at bedtime. 30  tablet 0   isosorbide mononitrate (IMDUR) 30 MG 24 hr tablet Take 1 tablet (30 mg total) by mouth daily. 90 tablet 3   isosorbide mononitrate (IMDUR) 60 MG 24 hr tablet Take 1 tablet (60 mg total) by mouth daily. 30 tablet 0   lansoprazole (PREVACID) 15 MG capsule Take 15 mg by mouth at bedtime.      latanoprost (XALATAN) 0.005 % ophthalmic solution 1 drop at bedtime.     metFORMIN (GLUCOPHAGE) 500 MG tablet Take 500 mg by mouth at bedtime.     Multiple Vitamin (MULTIVITAMIN WITH MINERALS) TABS tablet Take 1 tablet by mouth 2 (two) times daily.     nitroGLYCERIN (NITROSTAT) 0.4 MG SL tablet DISSOLVE 1 TABLET UNDER TONGUE EVERY 5 MINUTES AS NEEDED FOR CHESTPAIN  25 tablet 2   ondansetron (ZOFRAN) 4 MG tablet Take 1 tablet (4 mg total) by mouth every 6 (six) hours as needed for nausea. 20 tablet 0   ranolazine (RANEXA) 1000 MG SR tablet Take 1 tablet (1,000 mg total) by mouth 2 (two) times daily. 180 tablet 3   traZODone (DESYREL) 50 MG tablet Take 0.5 tablets (25 mg total) by mouth at bedtime as needed for sleep. 15 tablet 0   No current facility-administered medications for this visit.     Review of Systems    She denies palpitations, pnd, orthopnea, n, v, dizziness, syncope, edema, weight gain, or early satiety. All other systems reviewed and are otherwise negative except as noted above.   Physical Exam    VS:  BP 132/68 (BP Location: Left Arm, Patient Position: Sitting, Cuff Size: Normal)   Pulse 77   Ht 5' 5.5" (1.664 m)   Wt 176 lb 3.2 oz (79.9 kg)   SpO2 98%   BMI 28.88 kg/m   GEN: Well nourished, well developed, in no acute distress. HEENT: normal. Neck: Supple, no JVD, carotid bruits, or masses. Cardiac: RRR, no murmurs, rubs, or gallops. No clubbing, cyanosis, edema.  Radials/DP/PT 2+ and equal bilaterally.  Respiratory:  Respirations regular and unlabored, clear to auscultation bilaterally. GI: Soft, nontender, nondistended, BS + x 4. MS: no deformity or atrophy. Skin: warm  and dry, no rash. Neuro:  Strength and sensation are intact. Psych: Normal affect.  Accessory Clinical Findings    ECG personally reviewed by me today - EKG Interpretation Date/Time:  Thursday October 12 2022 10:20:12 EDT Ventricular Rate:  77 PR Interval:  152 QRS Duration:  70 QT Interval:  392 QTC Calculation: 443 R Axis:   53  Text Interpretation: Normal sinus rhythm Sinus arrhythmia T wave inversion v1, v2 Confirmed by Bernadene Person ([id number]) on 10/12/2022 10:28:16 AM  - no acute changes.   Lab Results  Component Value Date   WBC 3.7 (L) 07/18/2022   HGB 11.6 (L) 07/18/2022   HCT 35.3 (L) 07/18/2022   MCV 91.5 07/18/2022   PLT 146 (L) 07/18/2022   Lab Results  Component Value Date   CREATININE 1.28 (H) 07/31/2022   BUN 31 (H) 07/31/2022   NA 139 07/31/2022   K 4.6 07/31/2022   CL 102 07/31/2022   CO2 24 07/31/2022   Lab Results  Component Value Date   ALT 30 07/17/2022   AST 25 07/17/2022   ALKPHOS 54 07/17/2022   BILITOT 0.8 07/17/2022   Lab Results  Component Value Date   CHOL 125 07/17/2022   HDL 78 07/17/2022   LDLCALC 42 07/17/2022   TRIG 25 07/17/2022   CHOLHDL 1.6 07/17/2022    Lab Results  Component Value Date   HGBA1C 7.0 (H) 07/16/2022    Assessment & Plan   1. CAD: Coronary CT in 2022 revealed coronary calcium score of 2437 (99th percentile).  She eventually underwent cardiac catheterization November 2022 that showed severe single-vessel disease involving a codominant RCA with multiple segments of 70 to 90% stenosis, diffuse mild to moderate stenosis throughout the left coronary system, most notably 50% ramus, managed medically due to no being considered suitable for PCI.  She was hospitalized in April 2024 in the setting of chest pain, ongoing medical management was advised.  She continues to note almost daily intermittent chest discomfort, mild dyspnea on exertion.  She has taken nitroglycerin with some relief.  Will increase Imdur to 90 mg daily.  Given ongoing symptoms, I will reach out to Dr. Herbie Baltimore for any additional recommendations.  Reviewed ED precautions.  Continue aspirin, amlodipine, carvedilol, irbesartan, Imdur as above, Ranexa, and Lipitor.  2. Hypertension: BP well controlled. Continue current antihypertensive regimen.   3. Hyperlipidemia: LDL was 42 in 07/2022.  Continue Lipitor.  4. Mitral valve regurgitation: Echo in April 2024 showed normal LVEF, no RWMA, mild MR. Asymptomatic.  Consider repeat echo as clinically indicated.  5. CKD stage III: Creatinine was stable at 1.25 in 10/2022.   6. Type 2 diabetes: A1c was 7.0 in 07/2022.  He has noted intermittently elevated blood sugars.  Follows with endocrinology.  7. Disposition: Follow-up in 1 month.      Joylene Grapes, NP 10/12/2022, 10:57 AM

## 2022-10-12 NOTE — Patient Instructions (Signed)
Medication Instructions:  Increase Imdur 90 mg daily as directed  *If you need a refill on your cardiac medications before your next appointment, please call your pharmacy*   Lab Work: NONE ordered at this time of appointment    Testing/Procedures: NONE ordered at this time of appointment     Follow-Up: At [facility], you and your health needs are our priority.  As part of our continuing mission to provide you with exceptional heart care, we have created designated Provider Care Teams.  These Care Teams include your primary Cardiologist (physician) and Advanced Practice Providers (APPs -  Physician Assistants and Nurse Practitioners) who all work together to provide you with the care you need, when you need it.  We recommend signing up for the patient portal called "MyChart".  Sign up information is provided on this After Visit Summary.  MyChart is used to connect with patients for Virtual Visits (Telemedicine).  Patients are able to view lab/test results, encounter notes, upcoming appointments, etc.  Non-urgent messages can be sent to your provider as well.   To learn more about what you can do with MyChart, go to [email or web address].    Your next appointment:   1 month(s)  Provider:   Bryan Lemma, MD  or Bernadene Person, NP

## 2022-10-13 ENCOUNTER — Telehealth: Payer: Self-pay | Admitting: Nurse Practitioner

## 2022-10-13 DIAGNOSIS — I10 Essential (primary) hypertension: Secondary | ICD-10-CM

## 2022-10-13 MED ORDER — IRBESARTAN 300 MG PO TABS
300.0000 mg | ORAL_TABLET | Freq: Every day | ORAL | 3 refills | Status: DC
Start: 2022-10-13 — End: 2023-01-30

## 2022-10-13 NOTE — Telephone Encounter (Signed)
Patient is returning call.  °

## 2022-10-13 NOTE — Telephone Encounter (Signed)
Pt c/o medication issue:  1. Name of Medication: irbesartan (AVAPRO) 300 MG tablet   2. How are you currently taking this medication (dosage and times per day)?  Take 1 tablet (300 mg total) by mouth at bedtime.       3. Are you having a reaction (difficulty breathing--STAT)? No  4. What is your medication issue? Pt is requesting a callback regarding her appt yesterday and not getting a refill sent in for this medication. Please advise

## 2022-10-13 NOTE — Telephone Encounter (Signed)
Irbesartan 300 mg sent to [facility] per patient's request

## 2022-10-13 NOTE — Telephone Encounter (Signed)
Left voicemail to return call to ofice 

## 2022-10-17 ENCOUNTER — Encounter: Payer: Self-pay | Admitting: Hematology & Oncology

## 2022-10-17 DIAGNOSIS — Z794 Long term (current) use of insulin: Secondary | ICD-10-CM | POA: Diagnosis not present

## 2022-10-17 DIAGNOSIS — M8580 Other specified disorders of bone density and structure, unspecified site: Secondary | ICD-10-CM | POA: Diagnosis not present

## 2022-10-17 DIAGNOSIS — N1831 Chronic kidney disease, stage 3a: Secondary | ICD-10-CM | POA: Diagnosis not present

## 2022-10-17 DIAGNOSIS — E113593 Type 2 diabetes mellitus with proliferative diabetic retinopathy without macular edema, bilateral: Secondary | ICD-10-CM | POA: Diagnosis not present

## 2022-10-17 DIAGNOSIS — Z9884 Bariatric surgery status: Secondary | ICD-10-CM | POA: Diagnosis not present

## 2022-10-26 ENCOUNTER — Encounter (HOSPITAL_COMMUNITY): Payer: Self-pay | Admitting: *Deleted

## 2022-11-13 DIAGNOSIS — M791 Myalgia, unspecified site: Secondary | ICD-10-CM | POA: Diagnosis not present

## 2022-11-13 DIAGNOSIS — Z6829 Body mass index (BMI) 29.0-29.9, adult: Secondary | ICD-10-CM | POA: Diagnosis not present

## 2022-11-13 DIAGNOSIS — R5383 Other fatigue: Secondary | ICD-10-CM | POA: Diagnosis not present

## 2022-11-13 DIAGNOSIS — E663 Overweight: Secondary | ICD-10-CM | POA: Diagnosis not present

## 2022-11-13 DIAGNOSIS — M79641 Pain in right hand: Secondary | ICD-10-CM | POA: Diagnosis not present

## 2022-11-13 DIAGNOSIS — D8989 Other specified disorders involving the immune mechanism, not elsewhere classified: Secondary | ICD-10-CM | POA: Diagnosis not present

## 2022-11-13 DIAGNOSIS — M79642 Pain in left hand: Secondary | ICD-10-CM | POA: Diagnosis not present

## 2022-11-14 ENCOUNTER — Ambulatory Visit: Payer: Medicare Other | Admitting: Nurse Practitioner

## 2022-11-14 ENCOUNTER — Encounter: Payer: Self-pay | Admitting: Nurse Practitioner

## 2022-11-14 VITALS — BP 140/72 | HR 73 | Ht 65.0 in | Wt 176.8 lb

## 2022-11-14 DIAGNOSIS — I340 Nonrheumatic mitral (valve) insufficiency: Secondary | ICD-10-CM | POA: Diagnosis not present

## 2022-11-14 DIAGNOSIS — I25118 Atherosclerotic heart disease of native coronary artery with other forms of angina pectoris: Secondary | ICD-10-CM | POA: Insufficient documentation

## 2022-11-14 DIAGNOSIS — E785 Hyperlipidemia, unspecified: Secondary | ICD-10-CM | POA: Diagnosis not present

## 2022-11-14 DIAGNOSIS — E119 Type 2 diabetes mellitus without complications: Secondary | ICD-10-CM | POA: Diagnosis not present

## 2022-11-14 DIAGNOSIS — I10 Essential (primary) hypertension: Secondary | ICD-10-CM | POA: Insufficient documentation

## 2022-11-14 DIAGNOSIS — Z87898 Personal history of other specified conditions: Secondary | ICD-10-CM | POA: Insufficient documentation

## 2022-11-14 DIAGNOSIS — N183 Chronic kidney disease, stage 3 unspecified: Secondary | ICD-10-CM | POA: Diagnosis not present

## 2022-11-14 DIAGNOSIS — Z794 Long term (current) use of insulin: Secondary | ICD-10-CM | POA: Diagnosis not present

## 2022-11-14 NOTE — Patient Instructions (Signed)
Medication Instructions:  Your physician recommends that you continue on your current medications as directed. Please refer to the Current Medication list given to you today.   *If you need a refill on your cardiac medications before your next appointment, please call your pharmacy*   Lab Work: NONE ordered at this time of appointment    Testing/Procedures: NONE ordered at this time of appointment     Follow-Up: At [facility], you and your health needs are our priority.  As part of our continuing mission to provide you with exceptional heart care, we have created designated Provider Care Teams.  These Care Teams include your primary Cardiologist (physician) and Advanced Practice Providers (APPs -  Physician Assistants and Nurse Practitioners) who all work together to provide you with the care you need, when you need it.  We recommend signing up for the patient portal called "MyChart".  Sign up information is provided on this After Visit Summary.  MyChart is used to connect with patients for Virtual Visits (Telemedicine).  Patients are able to view lab/test results, encounter notes, upcoming appointments, etc.  Non-urgent messages can be sent to your provider as well.   To learn more about what you can do with MyChart, go to [email or web address].    Your next appointment:    Keep follow up   Provider:   Bryan Lemma, MD

## 2022-11-14 NOTE — Progress Notes (Signed)
Office Visit    Patient Name: Karen Gardner Date of Encounter: 11/14/2022  Primary Care Provider:  [name], MD Primary Cardiologist:  Bryan Lemma, MD  Chief Complaint    67 year old female with a history of CAD with chronic chest pain, mild MR, hypertension, hyperlipidemia, CKD stage IIIa, and type 2 diabetes who presents for follow-up related to CAD and chest pain.   Past Medical History    Past Medical History:  Diagnosis Date   Allergic rhinitis    Anxiety    Arthritis    Asthma    childhood   Chronic low back pain    CKD (chronic kidney disease), stage III (HCC)    However, most recent creatinine 1.5.   Coronary artery disease involving native heart with other form of angina pectoris, unspecified vessel or lesion type [facility]) 11/16/2020   11/16/2020: CORONARY CA++ SCORE: Agatston Score 2245.  LAD 1093, LCx 959, RCA 193 (99th percentile) -> COR CTA (Agatston 2437) -moderate to severe multivessel CAD with significant blooming in all 3 epicardial vessels.  CAD RADS 3-4. FFRCT suggests mid RCA occlusion, => CARDIAC CATH 02/02/21: RCA: prox 70%, Mid-distal 90% & distal 90% (small caliber, Not viable PCI target; Prox RI 50%.   Depression    With anxiety   GERD (gastroesophageal reflux disease)    Headache    sinus headaches    Hypercholesterolemia    Hypertension    Iron deficiency anemia 05/26/2015   Has required transfusions-followed by Dr. Myna Hidalgo   Iron malabsorption 05/26/2015   Menopause    Numbness in both hands    mostly at night   Obesity    Osteoarthritis of both knees    Status post right TKA (and ankle) with plans for left TKA   Type 2 diabetes mellitus without complication, with long-term current use of insulin (HCC)    Type 2 on insulin pump   Vasovagal syncope 2018   Negative work-up   Vitamin D deficiency    Past Surgical History:  Procedure Laterality Date   COLONOSCOPY     EYE SURGERY Bilateral    [facility]    HERNIA REPAIR     umb  hernia as child   KNEE ARTHROSCOPY  02/12/2012   Procedure: ARTHROSCOPY KNEE;  Surgeon: Nestor Lewandowsky, MD;  Location: [facility];  Service: Orthopedics;  Laterality: Right;  Partial Lateral Meniscectomy, Debridement chondromalacia   LAPAROSCOPIC GASTRIC SLEEVE RESECTION N/A 03/14/2016   Procedure: LAPAROSCOPIC GASTRIC SLEEVE RESECTION, WITH REPAIR OF HIATAL HERNIA REPAI, AND UPPER ENDO;  Surgeon: Luretha Murphy, MD;  Location: [facility];  Service: General;  Laterality: N/A;   LEFT HEART CATH AND CORONARY ANGIOGRAPHY N/A 02/02/2021   Procedure: LEFT HEART CATH AND CORONARY ANGIOGRAPHY;  Surgeon: Marykay Lex, MD;  Location: [facility];; Heavily calcified tortuous codominant RCA with proximal 70% stenosis.  Mid to distal RCA 90% stenosis.  Distal RCA 90%.  Not a PCI target.  50% RI, proximal to mid LAD 20%.  Mid to distal LCx 30% with 30% in LP AV-heavily calcified.  EF 55-65%.  Mildly elevated LVEDP.   ORIF ANKLE FRACTURE Right 02/11/2014   Procedure: OPEN REDUCTION INTERNAL FIXATION (ORIF) RIGHT ANKLE FRACTURE;  Surgeon: Nestor Lewandowsky, MD;  Location: [facility];  Service: Orthopedics;  Laterality: Right;   TOTAL KNEE ARTHROPLASTY Right 08/05/2014   Procedure: TOTAL KNEE ARTHROPLASTY;  Surgeon: Gean Birchwood, MD;  Location: [facility];  Service: Orthopedics;  Laterality: Right;   TRANSTHORACIC  ECHOCARDIOGRAM  05/2016   EF 60 to 65%.  Severe basal septal LVH with mild concentric LVH.  No R WMA.  GR 1 DD.  Indeterminate filling pressures. Minimal valve disease.   TRANSTHORACIC ECHOCARDIOGRAM  03/29/2021   EF 55 to 60%.  No R WMA-normal strain pattern.  GR 1 DD.  Normal RV with RV P mild aortic sclerosis but no stenosis no AI.  Mildly elevated RAP.   TRIGGER FINGER RELEASE Right 10/14/2018   Procedure: RELEASE TRIGGER FINGER/A-1 PULLEY;  Surgeon: Betha Loa, MD;  Location: [facility];  Service: Orthopedics;  Laterality: Right;   UPPER GI ENDOSCOPY       Allergies  Allergies  Allergen Reactions   Codeine Diarrhea   Lactose Intolerance (Gi) Diarrhea and Other (See Comments)    *Gas*    Oatmeal Other (See Comments)    "Gas"      Labs/Other Studies Reviewed    The following studies were reviewed today:  Cardiac Studies & Procedures   CARDIAC CATHETERIZATION  CARDIAC CATHETERIZATION 02/02/2021  Narrative   Ramus lesion is 50% stenosed. Prox LAD to Mid LAD lesion is 20% stenosed.   Heavily calcified, tortuous "codominant" RCA: Prox RCA lesion is 70% stenosed.  Mid RCA to Dist RCA lesion is 90% stenosed.  Dist RCA lesion is 90% stenosed. ->  Not PCI target   Mid Cx to Dist Cx lesion is 30% stenosed with 35% stenosed side branch in LPAV.  (Heavily calcified)   ------------------------------------------   The left ventricular systolic function is normal. The left ventricular ejection fraction is 55-65% by visual estimate.   LV end diastolic pressure is mildly elevated.   There is no aortic valve stenosis.  SUMMARY Moderate-severe diffusely calcified coronary arteries with severe single-vessel disease involving a small caliber codominant RCA with multiple segments of 70 to 90% stenoses-best treated medically. Diffuse mild to moderate stenosis throughout the left coronary system-most notably 50% Ramus Intermedius. Mildly elevated LVEDP with systemic hypertension. Very tortuous Left Subclavian-Innominate Artery system -> FOR FUTURE CARDIAC CATHETERIZATIONS, WOULD RECOMMEND FEMORAL ACCESS DUE TO VERY DIFFICULT MANIPULATION OF CATHETERS, AND ARTERIAL SPASM.   RECOMMENDATIONS Aggressive risk factor modification with lipid, glycemic and blood pressure management Daily aspirin Add carvedilol 3.125 mg daily with plans to titrate up further in outpatient setting. Hold metformin 48 hours post cath  Bryan Lemma, MD  Findings Coronary Findings Diagnostic  Dominance: Co-dominant  Left Main Vessel is large.  Left Anterior  Descending Vessel is large. The vessel exhibits minimal luminal irregularities. The vessel is moderately calcified. The vessel is tortuous. Prox LAD to Mid LAD lesion is 20% stenosed. The lesion is segmental and eccentric.  First Diagonal Branch Vessel is small in size. The vessel exhibits minimal luminal irregularities.  Ramus Intermedius Vessel is moderate in size There is mild diffuse disease throughout the vessel. There is moderate focal disease in the vessel. The vessel is calcified. Ramus lesion is 50% stenosed. The lesion is concentric. The lesion is moderately calcified.  Left Circumflex Vessel is large. There is mild diffuse disease throughout the vessel. There is mild focal disease in the vessel. The vessel is calcified. The vessel is tortuous. Mid Cx to Dist Cx lesion is 30% stenosed with 35% stenosed side branch in LPAV. The lesion is eccentric. The lesion is moderately calcified.  First Obtuse Marginal Branch Vessel is small in size.  First Left Posterolateral Branch Vessel is moderate in size.  Left Posterior Atrioventricular Artery Vessel is large in size.  Right Coronary Artery Vessel was injected. Vessel is small. Tapers from what looks like a moderate caliber vessel to very small caliber/diminutive &quot;codominant &quot;RCA -&gt; extensively tortuous and calcified.  Not PCI target There is moderate diffuse disease throughout the vessel. The vessel is severely calcified. The vessel is moderately tortuous. Prox RCA lesion is 70% stenosed. The lesion is focal, discrete and concentric. The lesion is severely calcified. Mid RCA to Dist RCA lesion is 90% stenosed. The lesion is located at the bend, segmental, eccentric and irregular. The lesion is severely calcified. Dist RCA lesion is 90% stenosed. The lesion is focal, discrete, eccentric and irregular. The lesion is moderately calcified.  Acute Marginal Branch Vessel is small in size.  Right Posterior Descending  Artery Vessel is small in size.  Intervention  No interventions have been documented.     ECHOCARDIOGRAM  ECHOCARDIOGRAM COMPLETE 07/17/2022  Narrative ECHOCARDIOGRAM REPORT    Patient Name:   SABRENA JUPITER Date of Exam: 07/17/2022 Medical Rec #:  [id number]        Height:       65.0 in Accession #:    [id number]       Weight:       190.0 lb Date of Birth:  1956/03/17        BSA:          1.935 m Patient Age:    67 years         BP:           162/84 mmHg Patient Gender: F                HR:           68 bpm. Exam Location:  Inpatient  Procedure: 2D Echo, Cardiac Doppler and Color Doppler  Indications:    chest pain  History:        Patient has prior history of Echocardiogram examinations, most recent 03/29/2021. CAD; Risk Factors:Hypertension, Dyslipidemia and Diabetes.  Sonographer:    Delcie Roch RDCS Referring Phys: [id number] CURTIS J WOODS  IMPRESSIONS   1. Left ventricular ejection fraction, by estimation, is 55 to 60%. The left ventricle has normal function. The left ventricle has no regional wall motion abnormalities. There is mild concentric left ventricular hypertrophy. Left ventricular diastolic parameters are indeterminate. 2. Right ventricular systolic function is normal. The right ventricular size is normal. There is normal pulmonary artery systolic pressure. 3. The mitral valve is normal in structure. Mild mitral valve regurgitation. No evidence of mitral stenosis. 4. The aortic valve is normal in structure. Aortic valve regurgitation is not visualized. Aortic valve sclerosis/calcification is present, without any evidence of aortic stenosis. 5. The inferior vena cava is normal in size with greater than 50% respiratory variability, suggesting right atrial pressure of 3 mmHg.  FINDINGS Left Ventricle: Left ventricular ejection fraction, by estimation, is 55 to 60%. The left ventricle has normal function. The left ventricle has no regional wall motion  abnormalities. The left ventricular internal cavity size was normal in size. There is mild concentric left ventricular hypertrophy. Left ventricular diastolic parameters are indeterminate.  Right Ventricle: The right ventricular size is normal. No increase in right ventricular wall thickness. Right ventricular systolic function is normal. There is normal pulmonary artery systolic pressure. The tricuspid regurgitant velocity is 2.52 m/s, and with an assumed right atrial pressure of 3 mmHg, the estimated right ventricular systolic pressure is 28.4 mmHg.  Left Atrium: Left atrial size was normal in size.  Right Atrium:  Right atrial size was normal in size.  Pericardium: Trivial pericardial effusion is present. The pericardial effusion is circumferential.  Mitral Valve: The mitral valve is normal in structure. Mild mitral valve regurgitation. No evidence of mitral valve stenosis.  Tricuspid Valve: The tricuspid valve is normal in structure. Tricuspid valve regurgitation is mild . No evidence of tricuspid stenosis.  Aortic Valve: The aortic valve is normal in structure. Aortic valve regurgitation is not visualized. Aortic valve sclerosis/calcification is present, without any evidence of aortic stenosis.  Pulmonic Valve: The pulmonic valve was normal in structure. Pulmonic valve regurgitation is trivial. No evidence of pulmonic stenosis.  Aorta: The aortic root is normal in size and structure.  Venous: The inferior vena cava is normal in size with greater than 50% respiratory variability, suggesting right atrial pressure of 3 mmHg.  IAS/Shunts: No atrial level shunt detected by color flow Doppler.   LEFT VENTRICLE PLAX 2D LVIDd:         4.40 cm   Diastology LVIDs:         3.00 cm   LV e' medial:    5.44 cm/s LV PW:         0.90 cm   LV E/e' medial:  16.3 LV IVS:        1.20 cm   LV e' lateral:   8.59 cm/s LVOT diam:     1.90 cm   LV E/e' lateral: 10.3 LV SV:         80 LV SV Index:    41 LVOT Area:     2.84 cm   RIGHT VENTRICLE             IVC RV Basal diam:  2.80 cm     IVC diam: 2.00 cm RV S prime:     12.40 cm/s TAPSE (M-mode): 1.9 cm  LEFT ATRIUM             Index        RIGHT ATRIUM           Index LA diam:        3.90 cm 2.02 cm/m   RA Area:     13.90 cm LA Vol (A2C):   43.5 ml 22.48 ml/m  RA Volume:   34.00 ml  17.57 ml/m LA Vol (A4C):   46.3 ml 23.92 ml/m LA Biplane Vol: 47.8 ml 24.70 ml/m AORTIC VALVE LVOT Vmax:   116.00 cm/s LVOT Vmean:  77.100 cm/s LVOT VTI:    0.283 m  AORTA Ao Root diam: 2.80 cm Ao Asc diam:  3.40 cm  MITRAL VALVE               TRICUSPID VALVE MV Area (PHT): 3.27 cm    TR Peak grad:   25.4 mmHg MV Decel Time: 232 msec    TR Vmax:        252.00 cm/s MV E velocity: 88.60 cm/s MV A velocity: 98.80 cm/s  SHUNTS MV E/A ratio:  0.90        Systemic VTI:  0.28 m Systemic Diam: 1.90 cm  Kardie Tobb DO Electronically signed by Thomasene Ripple DO Signature Date/Time: 07/17/2022/4:32:56 PM    Final    MONITORS  CARDIAC EVENT MONITOR 05/25/2016  Narrative NSR, rare ectopy.  No sustained arrhythmias.   CT SCANS  CT CORONARY MORPH W/CTA COR W/SCORE 01/17/2021  Addendum 01/17/2021  9:20 PM ADDENDUM REPORT: 01/17/2021 21:18  CLINICAL DATA:  Chest pain  EXAM: Cardiac/Coronary CTA  TECHNIQUE: A  non-contrast, gated CT scan was obtained with axial slices of 3 mm through the heart for calcium scoring. Calcium scoring was performed using the Agatston method. A 120 kV prospective, gated, contrast cardiac scan was obtained. Gantry rotation speed was 250 msecs and collimation was 0.6 mm. Two sublingual nitroglycerin tablets (0.8 mg) were given. The 3D data set was reconstructed in 5% intervals of the 35-75% of the R-R cycle. Diastolic phases were analyzed on a dedicated workstation using MPR, MIP, and VRT modes. The patient received 95 cc of contrast.  FINDINGS: Image quality: Excellent.  Noise artifact is:  Limited.  Coronary Arteries:  Normal coronary origin.  Right dominance.  Left main: The left main is a large caliber vessel with a normal take off from the left coronary cusp that bifurcates to form a left anterior descending artery and a left circumflex artery. There is no plaque or stenosis.  Left anterior descending artery: The LAD gives off 2 patent diagonal branches. The proximal LAD is heavily calcified with at least moderate mixed plaque but possibly severe plaque with stenosis of at least 50-69% but possibly > 70% with high risk features. There is mild calcified plaque in the mid LAD with associated stenosis of 25-49%. The D1 is not well defined but appears to have at least a moderate calcified plaque with stenosis 50-60%. There is significant blooming artifact so this may be overestimated. There is mild calcified plaque in the proximal D2 with associated stenosis of 25-49%.  Left circumflex artery: The LCX is non-dominant and gives off 2 patent obtuse marginal branches. There proximal to mid LCx is heavily calcified with circumferential calcification. There is at least moderate calcified plaque in the proximal and mid LCx with associated stenosis of 50-69% but may be > 70%. There is significant blooming artifact.  Right coronary artery: The RCA is dominant with normal take off from the right coronary cusp. The RCA terminates as a PDA and right posterolateral branch. There is moderate calcified plaque in the proximal RCA with associated stenosis of 50-69%. This is followed by moderate mixed plaque with associated stenosis of 50-69%. There is mild soft plaque in the distal RCA with associated stenosis of 50-69%.  Right Atrium: Right atrial size is within normal limits.  Right Ventricle: The right ventricular cavity is within normal limits.  Left Atrium: Left atrial size is normal in size with no left atrial appendage filling defect.  Left Ventricle: The ventricular  cavity size is within normal limits. There are no stigmata of prior infarction. There is no abnormal filling defect.  Pulmonary arteries: Normal in size without proximal filling defect.  Pulmonary veins: Normal pulmonary venous drainage.  Pericardium: Normal thickness with no significant effusion or calcium present.  Cardiac valves: The aortic valve is trileaflet without significant calcification. The mitral valve is normal structure without significant calcification.  Aorta: Normal caliber with no significant disease.  Extra-cardiac findings: See attached radiology report for non-cardiac structures.  IMPRESSION: 1. Coronary calcium score of 2437. This was 99th percentile for age-, sex, and race-matched controls.  2.  Normal coronary origin with right dominance.  3. Moderate atherosclerosis but possibly severe. There is significant blooming artifact in all 3 epicardial vessels which may overestimate degree of stenosis. CAD RADS 3 but possibly 4.  4.  Consider Cardiac catheterization.  5.  Study has been submitted for FFR analysis.  RECOMMENDATIONS: 1. CAD-RADS 0: No evidence of CAD (0%). Consider non-atherosclerotic causes of chest pain.  2. CAD-RADS 1: Minimal non-obstructive CAD (  0-24%). Consider non-atherosclerotic causes of chest pain. Consider preventive therapy and risk factor modification.  3. CAD-RADS 2: Mild non-obstructive CAD (25-49%). Consider non-atherosclerotic causes of chest pain. Consider preventive therapy and risk factor modification.  4. CAD-RADS 3: Moderate stenosis. Consider symptom-guided anti-ischemic pharmacotherapy as well as risk factor modification per guideline directed care. Additional analysis with CT FFR will be submitted.  5. CAD-RADS 4: Severe stenosis. (70-99% or > 50% left main). Cardiac catheterization or CT FFR is recommended. Consider symptom-guided anti-ischemic pharmacotherapy as well as risk factor modification per  guideline directed care. Invasive coronary angiography recommended with revascularization per published guideline statements.  6. CAD-RADS 5: Total coronary occlusion (100%). Consider cardiac catheterization or viability assessment. Consider symptom-guided anti-ischemic pharmacotherapy as well as risk factor modification per guideline directed care.  7. CAD-RADS N: Non-diagnostic study. Obstructive CAD can't be excluded. Alternative evaluation is recommended.  Armanda Magic, MD   Electronically Signed By: Armanda Magic M.D. On: 01/17/2021 21:18  Narrative EXAM: OVER-READ INTERPRETATION  CT CHEST  The following report is an over-read performed by radiologist Dr. Richarda Overlie of [facility] on 01/17/2021. This over-read does not include interpretation of cardiac or coronary anatomy or pathology. The coronary calcium score/coronary CTA interpretation by the cardiologist is attached.  COMPARISON:  11/16/2020  FINDINGS: Vascular: Normal caliber of the visualized thoracic aorta. Limited evaluation of the pulmonary arteries.  Mediastinum/Nodes: Evidence for a hiatal hernia associated with previous gastric surgery.  Lungs/Pleura: No pleural effusions. Few subtle densities along the periphery of the right lower lobe appear to be new and favor atelectasis. Again noted is a 2 mm nodule in the left lower lobe on sequence 11, image 8. Otherwise, the visualized lungs are clear without large pleural effusions.  Upper Abdomen: Small focus of enhancement in the right hepatic lobe on sequence 10 image 53 is probably an incidental finding. Again noted are postsurgical changes involving the stomach.  Musculoskeletal: Degenerative disc and endplate changes in thoracic spine.  IMPRESSION: No acute abnormalities involving the extracardiac structures.  Postsurgical changes in stomach.  Stable 2 mm nodule in the left lower lobe. No follow-up needed if patient is low-risk.  Non-contrast chest CT can be considered in 12 months if patient is high-risk. This recommendation follows the consensus statement: Guidelines for Management of Incidental Pulmonary Nodules Detected on CT Images: From the Fleischner Society 2017; Radiology 2017; 284:228-243.  Electronically Signed: By: Richarda Overlie M.D. On: 01/17/2021 15:09         Recent Labs: 07/17/2022: ALT 30; Magnesium 1.7 07/18/2022: Hemoglobin 11.6; Platelets 146 07/31/2022: BUN 31; Creatinine, Ser 1.28; Potassium 4.6; Sodium 139  Recent Lipid Panel    Component Value Date/Time   CHOL 125 07/17/2022 0947   TRIG 25 07/17/2022 0947   HDL 78 07/17/2022 0947   CHOLHDL 1.6 07/17/2022 0947   VLDL 5 07/17/2022 0947   LDLCALC 42 07/17/2022 0947    History of Present Illness    68 year old female with the above past medical history including CAD with chronic chest pain, mild MR, hypertension, hyperlipidemia, CKD stage IIIa, and type 2 diabetes,   She has a history of CAD.  Coronary CT in 2022 revealed coronary calcium score of 2437 (99th percentile).  She eventually underwent cardiac catheterization November 2022 that showed severe single-vessel disease involving a codominant RCA with multiple segments of 70 to 90% stenosis, diffuse mild to moderate stenosis throughout the left coronary system, most notably 50% ramus, managed medically due to not being considered suitable for  PCI.  Echocardiogram in December 2022 showed EF 55 to 60%, no RWMA, G1 DD. She was hospitalized in April 2024 in the setting of chest pain.  Troponin was negative.  CTA chest was negative for PE or dissection.  Repeat echocardiogram showed normal LVEF, no RWMA, mild MR.  She was discharged home in stable condition though she continued to note intermittent chest tightness with exertion.  Patient's cath films were reviewed by Dr. Herbie Baltimore who felt that RCA is without targets for PCI. Ranexa was increased to 1000 mg twice daily.  She was last seen in the  office on 10/12/2022 and noted intermittent chest tightness, relieved with nitroglycerin, mild dyspnea on exertion, generalized fatigue.  She continue to exercise, walking 1 hour/day.  Imdur was increased to 90 mg daily.   She presents today for follow-up.  Since her last visit she has been stable from a cardiac standpoint.  She does note that she has had intermittent chest tightness, overall improved since her last visit.  She has only taken nitroglycerin 1 since her last visit.  She has noted intermittent dizziness, additionally, she reports an episode of syncope that occurred while she was seated at an outdoor restaurant.  She has a history of prior syncope.  She questions whether or not she might have been dehydrated. She denies any further syncope, presyncope. Overall, her symptoms have been stable.  Home Medications    Current Outpatient Medications  Medication Sig Dispense Refill   amLODipine (NORVASC) 10 MG tablet Take 1 tablet (10 mg total) by mouth daily. 30 tablet 0   aspirin EC 81 MG tablet Take 1 tablet (81 mg total) by mouth daily. 30 minutes prior to taking Isosorbide 90 tablet 3   atorvastatin (LIPITOR) 80 MG tablet Take 1 tablet (80 mg total) by mouth at bedtime. 90 tablet 3   CALCIUM PO Take 1 capsule by mouth 3 (three) times daily.      carvedilol (COREG) 6.25 MG tablet TAKE 1 TABLET BY MOUTH TWICE DAILY 180 tablet 0   Continuous Blood Gluc Sensor (FREESTYLE LIBRE 14 DAY SENSOR) MISC Apply topically every 14 (fourteen) days.     Dulaglutide (TRULICITY) 0.75 MG/0.5ML SOPN Inject 0.75 mg into the skin once a week.     escitalopram (LEXAPRO) 10 MG tablet Take 10 mg by mouth at bedtime.      fluticasone (FLONASE) 50 MCG/ACT nasal spray Place 1 spray into both nostrils daily.     glucose blood (ONETOUCH VERIO) test strip Use as instructed to check blood sugar once a day dx code E11.65 50 each 2   insulin lispro (HUMALOG) 100 UNIT/ML KwikPen Inject 8 Units into the skin 3 (three) times  daily.     irbesartan (AVAPRO) 300 MG tablet Take 1 tablet (300 mg total) by mouth daily. Take at bedtime 90 tablet 3   isosorbide mononitrate (IMDUR) 30 MG 24 hr tablet Take 1 tablet (30 mg total) by mouth daily. 90 tablet 3   isosorbide mononitrate (IMDUR) 60 MG 24 hr tablet Take 1 tablet (60 mg total) by mouth daily. 30 tablet 0   lansoprazole (PREVACID) 15 MG capsule Take 15 mg by mouth at bedtime.      latanoprost (XALATAN) 0.005 % ophthalmic solution 1 drop at bedtime.     metFORMIN (GLUCOPHAGE) 500 MG tablet Take 500 mg by mouth at bedtime.     Multiple Vitamin (MULTIVITAMIN WITH MINERALS) TABS tablet Take 1 tablet by mouth 2 (two) times daily.  nitroGLYCERIN (NITROSTAT) 0.4 MG SL tablet DISSOLVE 1 TABLET UNDER TONGUE EVERY 5 MINUTES AS NEEDED FOR CHESTPAIN 25 tablet 2   ondansetron (ZOFRAN) 4 MG tablet Take 1 tablet (4 mg total) by mouth every 6 (six) hours as needed for nausea. 20 tablet 0   ranolazine (RANEXA) 1000 MG SR tablet Take 1 tablet (1,000 mg total) by mouth 2 (two) times daily. 180 tablet 3   traZODone (DESYREL) 50 MG tablet Take 0.5 tablets (25 mg total) by mouth at bedtime as needed for sleep. 15 tablet 0   No current facility-administered medications for this visit.     Review of Systems    She denies palpitations, dyspnea, pnd, orthopnea, n, v, edema, weight gain, or early satiety. All other systems reviewed and are otherwise negative except as noted above.   Physical Exam    VS:  BP (!) 140/72 (BP Location: Left Arm, Patient Position: Sitting, Cuff Size: Normal)   Pulse 73   Ht 5\' 5"  (1.651 m)   Wt 176 lb 12.8 oz (80.2 kg)   SpO2 95%   BMI 29.42 kg/m  GEN: Well nourished, well developed, in no acute distress. HEENT: normal. Neck: Supple, no JVD, carotid bruits, or masses. Cardiac: RRR, no murmurs, rubs, or gallops. No clubbing, cyanosis, edema.  Radials/DP/PT 2+ and equal bilaterally.  Respiratory:  Respirations regular and unlabored, clear to auscultation  bilaterally. GI: Soft, nontender, nondistended, BS + x 4. MS: no deformity or atrophy. Skin: warm and dry, no rash. Neuro:  Strength and sensation are intact. Psych: Normal affect.  Accessory Clinical Findings    ECG personally reviewed by me today -    - no EKG in office today.   Lab Results  Component Value Date   WBC 3.7 (L) 07/18/2022   HGB 11.6 (L) 07/18/2022   HCT 35.3 (L) 07/18/2022   MCV 91.5 07/18/2022   PLT 146 (L) 07/18/2022   Lab Results  Component Value Date   CREATININE 1.28 (H) 07/31/2022   BUN 31 (H) 07/31/2022   NA 139 07/31/2022   K 4.6 07/31/2022   CL 102 07/31/2022   CO2 24 07/31/2022   Lab Results  Component Value Date   ALT 30 07/17/2022   AST 25 07/17/2022   ALKPHOS 54 07/17/2022   BILITOT 0.8 07/17/2022   Lab Results  Component Value Date   CHOL 125 07/17/2022   HDL 78 07/17/2022   LDLCALC 42 07/17/2022   TRIG 25 07/17/2022   CHOLHDL 1.6 07/17/2022    Lab Results  Component Value Date   HGBA1C 7.0 (H) 07/16/2022    Assessment & Plan    1. CAD: Coronary CT in 2022 revealed coronary calcium score of 2437 (99th percentile).  She eventually underwent cardiac catheterization November 2022 that showed severe single-vessel disease involving a codominant RCA with multiple segments of 70 to 90% stenosis, diffuse mild to moderate stenosis throughout the left coronary system, most notably 50% ramus, managed medically (not considered suitable for PCI).  She was hospitalized in April 2024 in the setting of chest pain, ongoing medical management was advised.  Imdur was increased at her last visit.  She continues to note intermittent chest tightness, overall improved.  She has only taken nitroglycerin once since her last visit.  Overall symptoms appear to be stable, will review ongoing recommendations with Dr. Herbie Baltimore.  Reviewed ED precautions. Continue aspirin, amlodipine, carvedilol, irbesartan, Imdur, Ranexa, and Lipitor.   2. Hypertension: BP well  controlled. Continue current antihypertensive regimen.  3. Hyperlipidemia: LDL was 42 in 07/2022.  Continue Lipitor.   4. Mitral valve regurgitation: Echo in April 2024 showed normal LVEF, no RWMA, mild MR. Asymptomatic. Consider repeat echo as clinically indicated.    5. History of syncope: She had a prior episode of syncope that was noted at her office visit in December 2023.  She was at church and had not had much to eat or drink, she felt overheated, possibly dehydrated, which led to a syncopal event.  She she states she has had several episodes of syncope since, most recently just 4 days ago.  She was seated in an outdoor restaurant, she notes she "passed out" and when she woke up she had "regurgitated."  She denies any further syncope, presyncope, though she does note intermittent dizziness, this is not new.  We discussed possible 30-day event monitor, carotid Dopplers.  Recent echo overall reassuring.  BP and HR have been stable.  She declines any additional testing at this time. Reviewed ED precautions. Encouraged adequate hydration, adequate oral intake.  6. CKD stage III: Creatinine was stable at 1.25 in 10/2022.    7. Type 2 diabetes: A1c was 7.1 in 10/2022. Follows with endocrinology.   8. Disposition: Follow-up as scheduled with Dr. Herbie Baltimore in 02/2023.   Joylene Grapes, NP 11/14/2022, 10:21 AM

## 2022-11-15 ENCOUNTER — Encounter: Payer: Self-pay | Admitting: Podiatry

## 2022-11-15 ENCOUNTER — Ambulatory Visit (INDEPENDENT_AMBULATORY_CARE_PROVIDER_SITE_OTHER): Payer: Medicare Other | Admitting: Podiatry

## 2022-11-15 DIAGNOSIS — Z794 Long term (current) use of insulin: Secondary | ICD-10-CM

## 2022-11-15 DIAGNOSIS — M25571 Pain in right ankle and joints of right foot: Secondary | ICD-10-CM

## 2022-11-15 DIAGNOSIS — G8929 Other chronic pain: Secondary | ICD-10-CM

## 2022-11-15 DIAGNOSIS — E119 Type 2 diabetes mellitus without complications: Secondary | ICD-10-CM

## 2022-11-15 NOTE — Progress Notes (Signed)
This patient presents to the office  for evaluation of her right ankle.  She has history of ORIF right ankle.  She says she has chronic swelling.he also says there is a plate in her ankle.  She is diabetic.  Vascular  Dorsalis pedis  are palpable  B/L. Posterior tibial pulses are weakly palpable. Capillary return  WNL.  Temperature gradient is  WNL.  Skin turgor  WNL  Sensorium  Senn Weinstein monofilament wire  WNL. Normal tactile sensation.  Nail Exam  Patient has normal nails with no evidence of bacterial or fungal infection.  Orthopedic  Exam  Muscle tone and muscle strength  WNL.  No limitations of motion feet  B/L.  No crepitus or joint effusion noted.  Foot type is unremarkable and digits show no abnormalities.  HAV  B/L. Elongated second toe left foot.  Skin  No open lesions.  Normal skin texture and turgor.   Ankle pain right  IE.Marland Kitchen  Patient was referred to Dr.  Allena Katz for surgical consult.  Helane Gunther DPM

## 2022-11-29 ENCOUNTER — Ambulatory Visit (INDEPENDENT_AMBULATORY_CARE_PROVIDER_SITE_OTHER): Payer: Medicare Other | Admitting: Podiatry

## 2022-11-29 DIAGNOSIS — T85848A Pain due to other internal prosthetic devices, implants and grafts, initial encounter: Secondary | ICD-10-CM

## 2022-11-29 NOTE — Progress Notes (Signed)
Subjective:  Patient ID: Karen Gardner, female    DOB: 1955/06/12,  MRN: [id number]  Chief Complaint  Patient presents with   Foot Pain    67 y.o. female presents with the above complaint.  Patient presents with right ankle painful orthopedic hardware.  She states is bothering her.  She is a diabetic with last A1c of 7.1%.  She just wanted get it evaluated.  She states becoming more prominent and is bothering her.  No open wounds or lesion.  Pes cavus bilaterally.  Dull aching nature.   Review of Systems: Negative except as noted in the HPI. Denies N/V/F/Ch.  Past Medical History:  Diagnosis Date   Allergic rhinitis    Anxiety    Arthritis    Asthma    childhood   Chronic low back pain    CKD (chronic kidney disease), stage III (HCC)    However, most recent creatinine 1.5.   Coronary artery disease involving native heart with other form of angina pectoris, unspecified vessel or lesion type [facility]) 11/16/2020   11/16/2020: CORONARY CA++ SCORE: Agatston Score 2245.  LAD 1093, LCx 959, RCA 193 (99th percentile) -> COR CTA (Agatston 2437) -moderate to severe multivessel CAD with significant blooming in all 3 epicardial vessels.  CAD RADS 3-4. FFRCT suggests mid RCA occlusion, => CARDIAC CATH 02/02/21: RCA: prox 70%, Mid-distal 90% & distal 90% (small caliber, Not viable PCI target; Prox RI 50%.   Depression    With anxiety   GERD (gastroesophageal reflux disease)    Headache    sinus headaches    Hypercholesterolemia    Hypertension    Iron deficiency anemia 05/26/2015   Has required transfusions-followed by Dr. Myna Hidalgo   Iron malabsorption 05/26/2015   Menopause    Numbness in both hands    mostly at night   Obesity    Osteoarthritis of both knees    Status post right TKA (and ankle) with plans for left TKA   Type 2 diabetes mellitus without complication, with long-term current use of insulin (HCC)    Type 2 on insulin pump   Vasovagal syncope 2018   Negative work-up    Vitamin D deficiency     Current Outpatient Medications:    amLODipine (NORVASC) 10 MG tablet, Take 1 tablet (10 mg total) by mouth daily., Disp: 30 tablet, Rfl: 0   aspirin EC 81 MG tablet, Take 1 tablet (81 mg total) by mouth daily. 30 minutes prior to taking Isosorbide, Disp: 90 tablet, Rfl: 3   atorvastatin (LIPITOR) 80 MG tablet, Take 1 tablet (80 mg total) by mouth at bedtime., Disp: 90 tablet, Rfl: 3   CALCIUM PO, Take 1 capsule by mouth 3 (three) times daily. , Disp: , Rfl:    carvedilol (COREG) 6.25 MG tablet, TAKE 1 TABLET BY MOUTH TWICE DAILY, Disp: 180 tablet, Rfl: 0   Continuous Blood Gluc Sensor (FREESTYLE LIBRE 14 DAY SENSOR) MISC, Apply topically every 14 (fourteen) days., Disp: , Rfl:    Dulaglutide (TRULICITY) 0.75 MG/0.5ML SOPN, Inject 0.75 mg into the skin once a week., Disp: , Rfl:    escitalopram (LEXAPRO) 10 MG tablet, Take 10 mg by mouth at bedtime. , Disp: , Rfl:    fluticasone (FLONASE) 50 MCG/ACT nasal spray, Place 1 spray into both nostrils daily., Disp: , Rfl:    glucose blood (ONETOUCH VERIO) test strip, Use as instructed to check blood sugar once a day dx code E11.65, Disp: 50 each, Rfl: 2   insulin lispro (  HUMALOG) 100 UNIT/ML KwikPen, Inject 8 Units into the skin 3 (three) times daily., Disp: , Rfl:    irbesartan (AVAPRO) 300 MG tablet, Take 1 tablet (300 mg total) by mouth daily. Take at bedtime, Disp: 90 tablet, Rfl: 3   isosorbide mononitrate (IMDUR) 30 MG 24 hr tablet, Take 1 tablet (30 mg total) by mouth daily., Disp: 90 tablet, Rfl: 3   isosorbide mononitrate (IMDUR) 60 MG 24 hr tablet, Take 1 tablet (60 mg total) by mouth daily., Disp: 30 tablet, Rfl: 0   lansoprazole (PREVACID) 15 MG capsule, Take 15 mg by mouth at bedtime. , Disp: , Rfl:    latanoprost (XALATAN) 0.005 % ophthalmic solution, 1 drop at bedtime., Disp: , Rfl:    metFORMIN (GLUCOPHAGE) 500 MG tablet, Take 500 mg by mouth at bedtime., Disp: , Rfl:    Multiple Vitamin (MULTIVITAMIN WITH  MINERALS) TABS tablet, Take 1 tablet by mouth 2 (two) times daily., Disp: , Rfl:    nitroGLYCERIN (NITROSTAT) 0.4 MG SL tablet, DISSOLVE 1 TABLET UNDER TONGUE EVERY 5 MINUTES AS NEEDED FOR CHESTPAIN, Disp: 25 tablet, Rfl: 2   ondansetron (ZOFRAN) 4 MG tablet, Take 1 tablet (4 mg total) by mouth every 6 (six) hours as needed for nausea., Disp: 20 tablet, Rfl: 0   ranolazine (RANEXA) 1000 MG SR tablet, Take 1 tablet (1,000 mg total) by mouth 2 (two) times daily., Disp: 180 tablet, Rfl: 3   traZODone (DESYREL) 50 MG tablet, Take 0.5 tablets (25 mg total) by mouth at bedtime as needed for sleep., Disp: 15 tablet, Rfl: 0  Social History   Tobacco Use  Smoking Status Former   Current packs/day: 0.00   Average packs/day: 0.3 packs/day for 1 year (0.3 ttl pk-yrs)   Types: Cigarettes   Start date: 02/08/1971   Quit date: 02/08/1972   Years since quitting: 50.8  Smokeless Tobacco Never    Allergies  Allergen Reactions   Codeine Diarrhea   Lactose Intolerance (Gi) Diarrhea and Other (See Comments)    *Gas*    Oatmeal Other (See Comments)    "Gas"    Objective:  There were no vitals filed for this visit. There is no height or weight on file to calculate BMI. Constitutional Well developed. Well nourished.  Vascular Dorsalis pedis pulses palpable bilaterally. Posterior tibial pulses palpable bilaterally. Capillary refill normal to all digits.  No cyanosis or clubbing noted. Pedal hair growth normal.  Neurologic Normal speech. Oriented to person, place, and time. Epicritic sensation to light touch grossly present bilaterally.  Dermatologic Nails well groomed and normal in appearance. No open wounds. No skin lesions.  Orthopedic: Pain along the course of the lateral and medial.  No open wounds or lesion noted no palpable screw or plate noted.   Radiographs: None Assessment:   1. Pain from implanted hardware, initial encounter    Plan:  Patient was evaluated and treated and all  questions answered.  Right ankle orthopedic hardware painful -All questions and concerns were discussed with the patient in extensive detail.  Ultimately discussed with if he continues to bother work she can go to her previous physician to have it removed or I may be happy to remove the hardware if needed.  She states understanding.  She will talk to her diabetic doctor to make sure her A1c is in fact below 8% and will reach back out to me when she is ready.  No follow-ups on file.

## 2022-11-30 DIAGNOSIS — Z6829 Body mass index (BMI) 29.0-29.9, adult: Secondary | ICD-10-CM | POA: Diagnosis not present

## 2022-11-30 DIAGNOSIS — R5383 Other fatigue: Secondary | ICD-10-CM | POA: Diagnosis not present

## 2022-11-30 DIAGNOSIS — M79641 Pain in right hand: Secondary | ICD-10-CM | POA: Diagnosis not present

## 2022-11-30 DIAGNOSIS — E663 Overweight: Secondary | ICD-10-CM | POA: Diagnosis not present

## 2022-11-30 DIAGNOSIS — M79642 Pain in left hand: Secondary | ICD-10-CM | POA: Diagnosis not present

## 2022-11-30 DIAGNOSIS — M791 Myalgia, unspecified site: Secondary | ICD-10-CM | POA: Diagnosis not present

## 2022-12-05 IMAGING — CT CT HEAD W/O CM
3 series · 15 of 47 positions shown, 18 images · non-contrast
Comparison: 09/07/2015

CLINICAL DATA: Syncope, hypertension, headache

EXAM:
CT HEAD WITHOUT CONTRAST
TECHNIQUE: Contiguous axial images were obtained from the base of the skull
through the vertex without intravenous contrast.

[Series 3: head 5.0 h30s · axial · 0.42mm/px · z∈[-106,+19]mm · 9 of 31 slices shown, 12 images]
[im 3/31  brain]
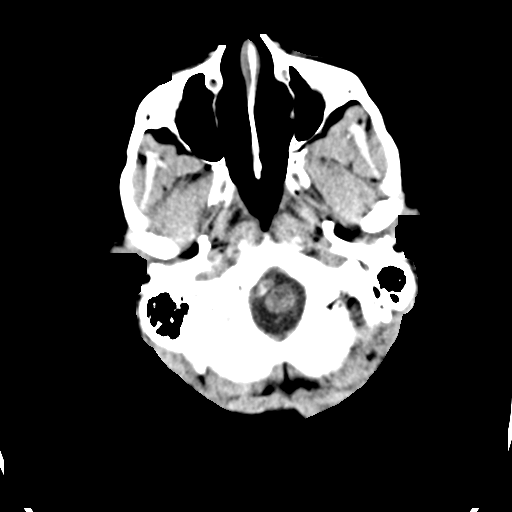
[im 3/31  bone]
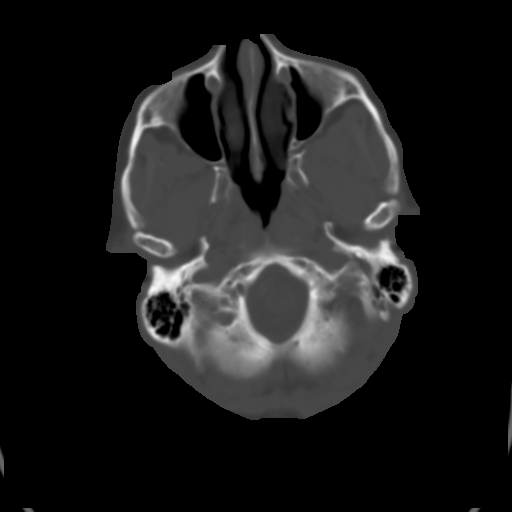
[im 6/31  brain]
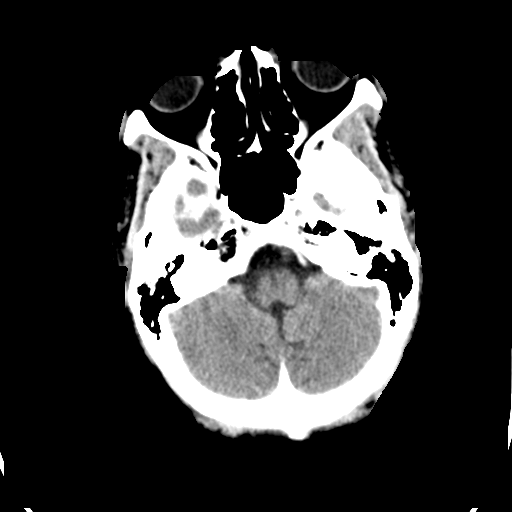
[im 9/31  brain]
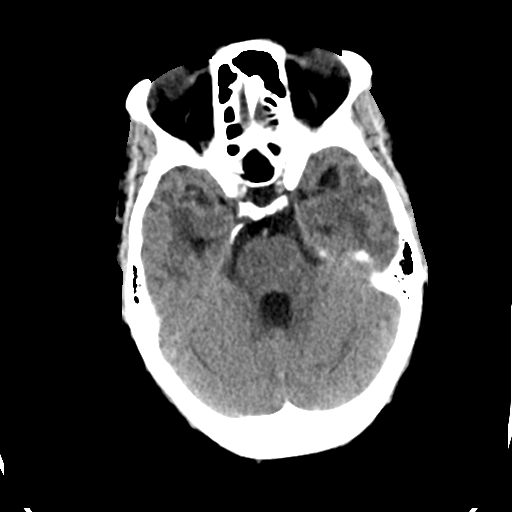
[im 12/31  brain]
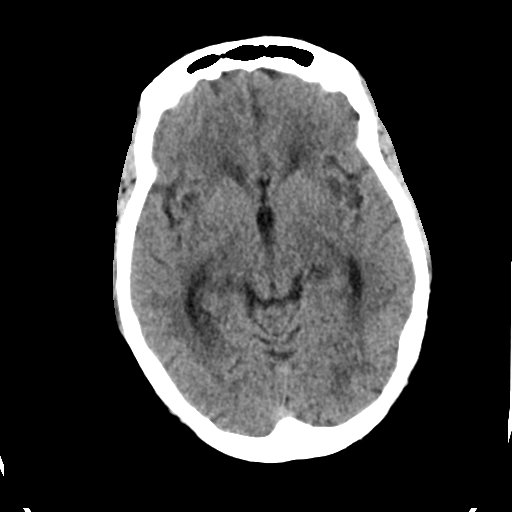
[im 16/31  brain]
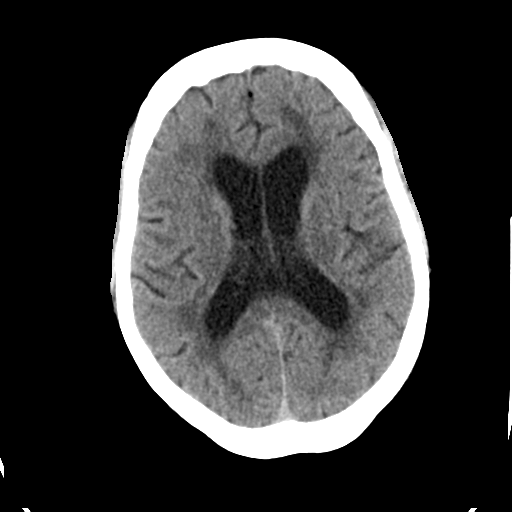
[im 16/31  bone]
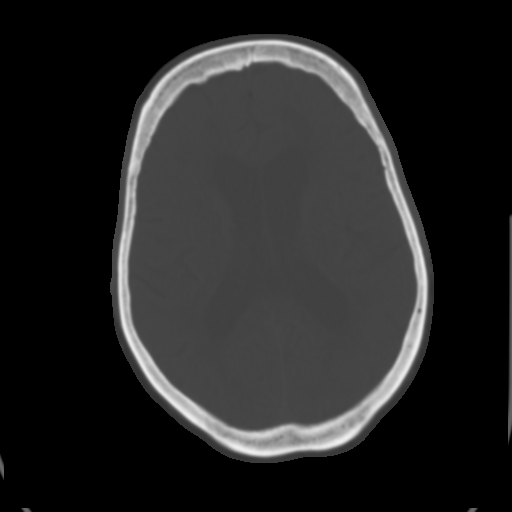
[im 19/31  brain]
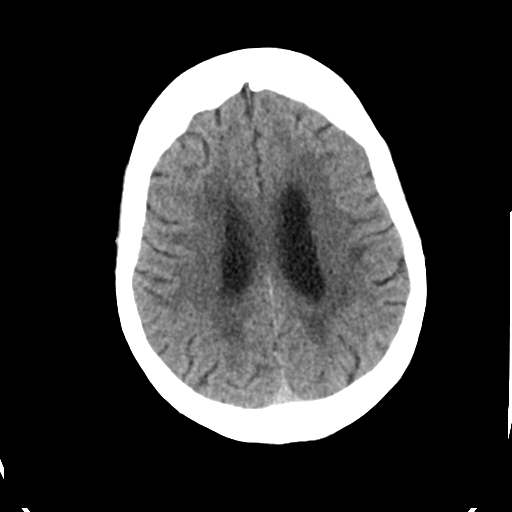
[im 22/31  brain]
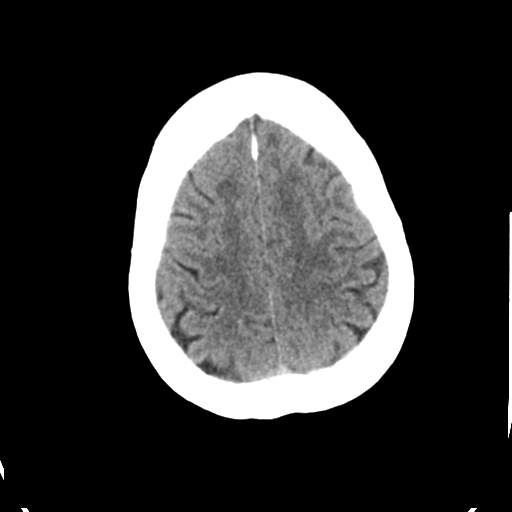
[im 25/31  brain]
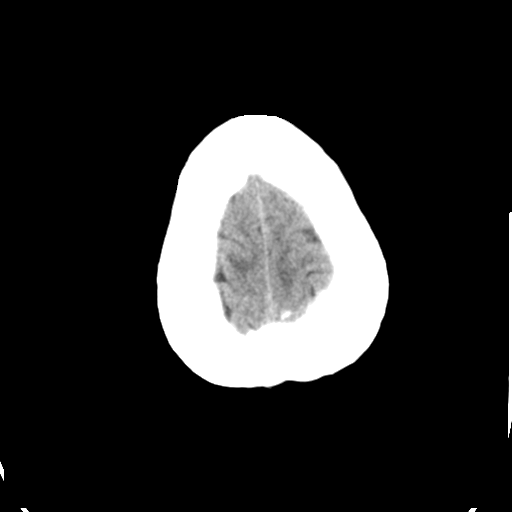
[im 28/31  brain]
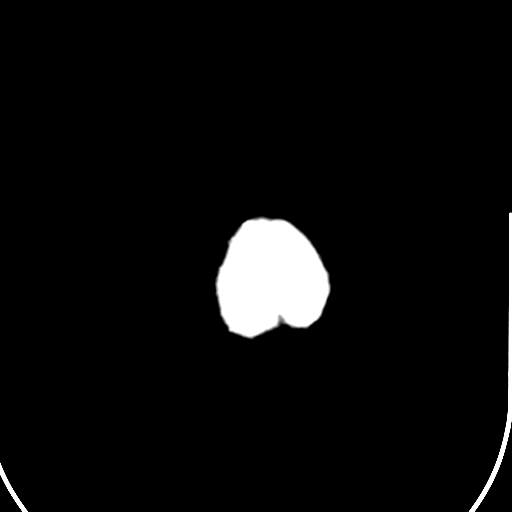
[im 28/31  bone]
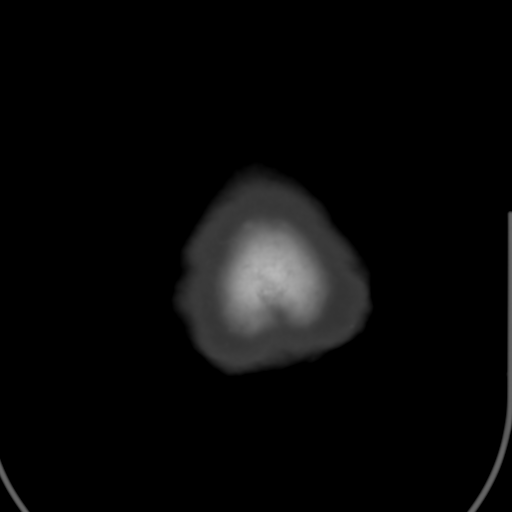

[Series 5: head 3.0 mpr cor · coronal · 0.30mm/px · 3 of 70 slices shown]
[im 24/70  brain]
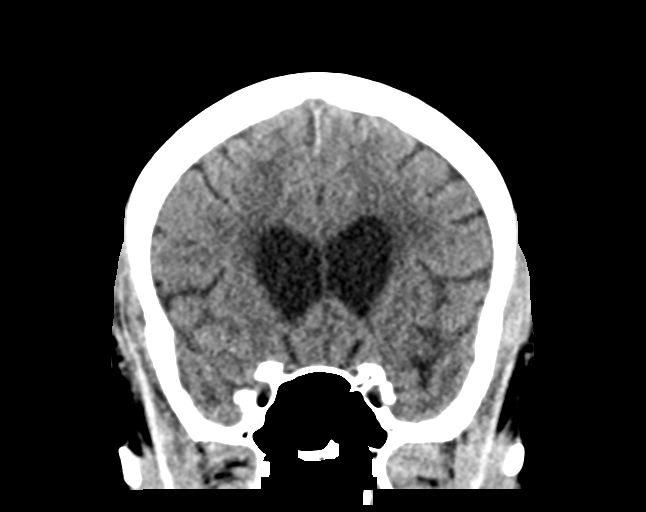
[im 31/70  brain]
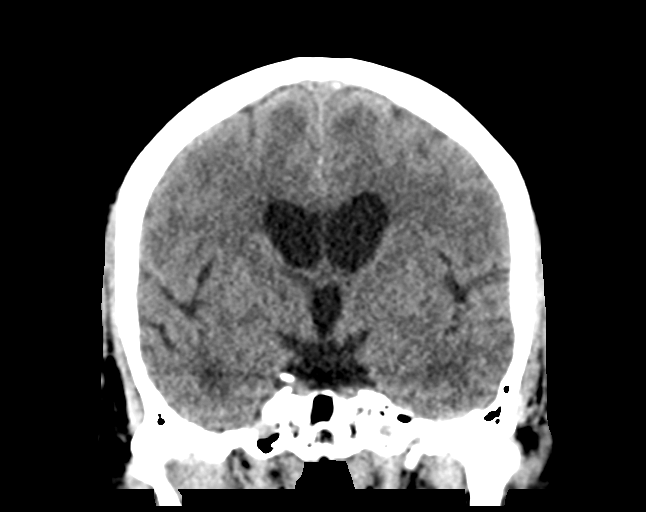
[im 39/70  brain]
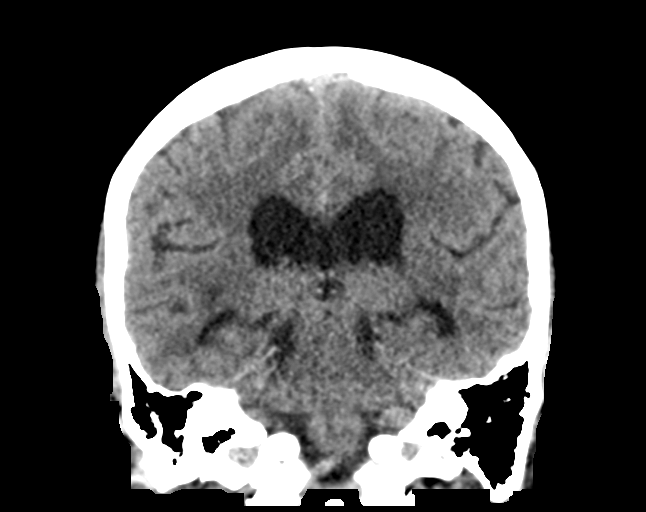

[Series 6: head 3.0 mpr sag · sagittal · 0.30mm/px · 3 of 57 slices shown]
[im 19/57  brain]
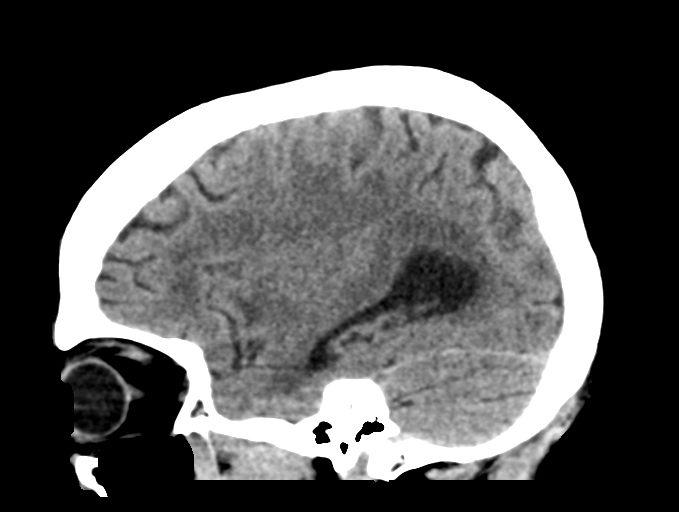
[im 29/57  brain]
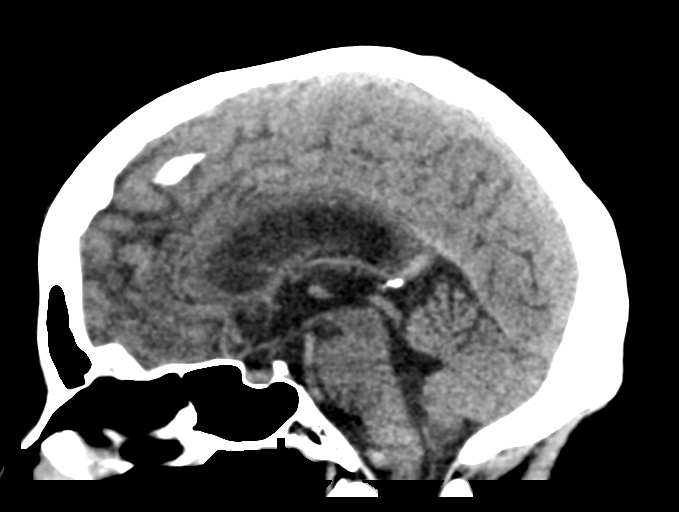
[im 38/57  brain]
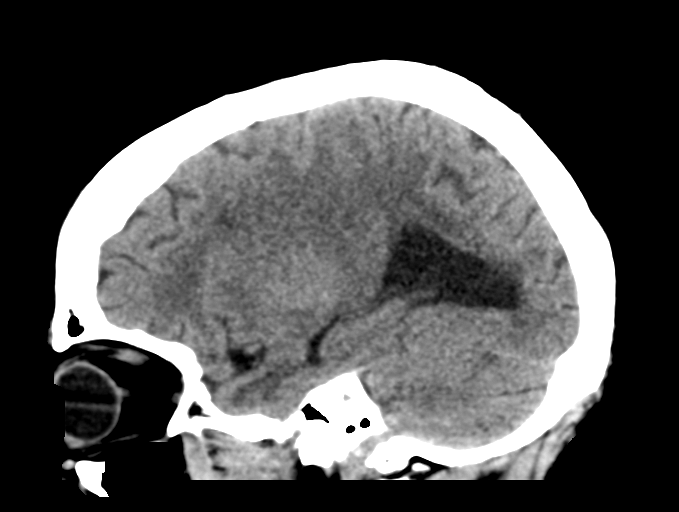

[15 of 47 positions shown; findings below may reference images not displayed]

FINDINGS: Brain: Progressive hypodensities throughout the periventricular and
subcortical white matter most consistent with chronic small vessel
ischemic changes. No other signs of acute infarct or hemorrhage.
Lateral ventricles and midline structures are stable. There are no
acute extra-axial fluid collections. No mass effect.

Vascular: No hyperdense vessel or unexpected calcification.

Skull: Normal. Negative for fracture or focal lesion.

Sinuses/Orbits: No acute finding.

Other: None.
IMPRESSION: 1. Chronic small vessel ischemic changes throughout the white
matter, more pronounced than previous.
2. Otherwise no acute intracranial process.

## 2022-12-15 ENCOUNTER — Telehealth: Payer: Self-pay | Admitting: Cardiology

## 2022-12-15 MED ORDER — CARVEDILOL 6.25 MG PO TABS: ORAL_TABLET | ORAL | 3 refills | Status: DC

## 2022-12-15 NOTE — Telephone Encounter (Signed)
Call to verify medication .  Sent to pharmacy

## 2022-12-15 NOTE — Telephone Encounter (Signed)
*  STAT* If patient is at the pharmacy, call can be transferred to refill team.   1. Which medications need to be refilled? (please list name of each medication and dose if known)   carvedilol (COREG) 6.25 MG tablet   2. Would you like to learn more about the convenience, safety, & potential cost savings by using the [facility]?   3. Are you open to using the [facility] (Type [facility]. ).  4. Which pharmacy/location (including street and city if local pharmacy) is medication to be sent to?  [id number] - [place], [place] - [address] AT [address] [facility] [address]   5. Do they need a 30 day or 90 day supply?   90 day  Patient stated she only has a couple of tablets left.  Patient has appointment scheduled on 11/4.

## 2022-12-19 DIAGNOSIS — Z961 Presence of intraocular lens: Secondary | ICD-10-CM | POA: Diagnosis not present

## 2022-12-19 DIAGNOSIS — H25011 Cortical age-related cataract, right eye: Secondary | ICD-10-CM | POA: Diagnosis not present

## 2022-12-19 DIAGNOSIS — H401131 Primary open-angle glaucoma, bilateral, mild stage: Secondary | ICD-10-CM | POA: Diagnosis not present

## 2022-12-19 DIAGNOSIS — H43821 Vitreomacular adhesion, right eye: Secondary | ICD-10-CM | POA: Diagnosis not present

## 2022-12-19 DIAGNOSIS — H2511 Age-related nuclear cataract, right eye: Secondary | ICD-10-CM | POA: Diagnosis not present

## 2022-12-19 DIAGNOSIS — E113593 Type 2 diabetes mellitus with proliferative diabetic retinopathy without macular edema, bilateral: Secondary | ICD-10-CM | POA: Diagnosis not present

## 2022-12-26 DIAGNOSIS — Z23 Encounter for immunization: Secondary | ICD-10-CM | POA: Diagnosis not present

## 2023-01-05 ENCOUNTER — Other Ambulatory Visit: Payer: Self-pay | Admitting: Cardiology

## 2023-01-25 ENCOUNTER — Inpatient Hospital Stay (HOSPITAL_COMMUNITY)
Admission: EM | Admit: 2023-01-25 | Discharge: 2023-01-30 | DRG: 316 | Disposition: A | Discharge status: Home / Self Care | Payer: Medicare Other | Attending: Internal Medicine | Admitting: Internal Medicine

## 2023-01-25 ENCOUNTER — Emergency Department (HOSPITAL_COMMUNITY): Payer: Medicare Other

## 2023-01-25 ENCOUNTER — Encounter (HOSPITAL_COMMUNITY): Payer: Self-pay | Admitting: Emergency Medicine

## 2023-01-25 ENCOUNTER — Other Ambulatory Visit: Payer: Self-pay

## 2023-01-25 DIAGNOSIS — I10 Essential (primary) hypertension: Secondary | ICD-10-CM | POA: Diagnosis present

## 2023-01-25 DIAGNOSIS — I309 Acute pericarditis, unspecified: Secondary | ICD-10-CM | POA: Diagnosis not present

## 2023-01-25 DIAGNOSIS — T380X5A Adverse effect of glucocorticoids and synthetic analogues, initial encounter: Secondary | ICD-10-CM | POA: Diagnosis not present

## 2023-01-25 DIAGNOSIS — N1831 Chronic kidney disease, stage 3a: Secondary | ICD-10-CM | POA: Diagnosis present

## 2023-01-25 DIAGNOSIS — E7800 Pure hypercholesterolemia, unspecified: Secondary | ICD-10-CM | POA: Diagnosis present

## 2023-01-25 DIAGNOSIS — E66811 Obesity, class 1: Secondary | ICD-10-CM | POA: Diagnosis present

## 2023-01-25 DIAGNOSIS — Z87891 Personal history of nicotine dependence: Secondary | ICD-10-CM | POA: Diagnosis not present

## 2023-01-25 DIAGNOSIS — F32A Depression, unspecified: Secondary | ICD-10-CM | POA: Diagnosis present

## 2023-01-25 DIAGNOSIS — R6889 Other general symptoms and signs: Secondary | ICD-10-CM | POA: Diagnosis not present

## 2023-01-25 DIAGNOSIS — Z9884 Bariatric surgery status: Secondary | ICD-10-CM

## 2023-01-25 DIAGNOSIS — R1310 Dysphagia, unspecified: Secondary | ICD-10-CM | POA: Diagnosis not present

## 2023-01-25 DIAGNOSIS — R091 Pleurisy: Secondary | ICD-10-CM | POA: Diagnosis present

## 2023-01-25 DIAGNOSIS — K219 Gastro-esophageal reflux disease without esophagitis: Secondary | ICD-10-CM | POA: Diagnosis present

## 2023-01-25 DIAGNOSIS — D631 Anemia in chronic kidney disease: Secondary | ICD-10-CM | POA: Diagnosis present

## 2023-01-25 DIAGNOSIS — E1122 Type 2 diabetes mellitus with diabetic chronic kidney disease: Secondary | ICD-10-CM | POA: Diagnosis not present

## 2023-01-25 DIAGNOSIS — R071 Chest pain on breathing: Secondary | ICD-10-CM | POA: Diagnosis not present

## 2023-01-25 DIAGNOSIS — R0789 Other chest pain: Secondary | ICD-10-CM | POA: Diagnosis not present

## 2023-01-25 DIAGNOSIS — Z794 Long term (current) use of insulin: Secondary | ICD-10-CM | POA: Diagnosis not present

## 2023-01-25 DIAGNOSIS — R52 Pain, unspecified: Secondary | ICD-10-CM | POA: Diagnosis present

## 2023-01-25 DIAGNOSIS — K2289 Other specified disease of esophagus: Secondary | ICD-10-CM | POA: Diagnosis not present

## 2023-01-25 DIAGNOSIS — E119 Type 2 diabetes mellitus without complications: Secondary | ICD-10-CM

## 2023-01-25 DIAGNOSIS — Z743 Need for continuous supervision: Secondary | ICD-10-CM | POA: Diagnosis not present

## 2023-01-25 DIAGNOSIS — K449 Diaphragmatic hernia without obstruction or gangrene: Secondary | ICD-10-CM | POA: Diagnosis not present

## 2023-01-25 DIAGNOSIS — I300 Acute nonspecific idiopathic pericarditis: Secondary | ICD-10-CM | POA: Diagnosis not present

## 2023-01-25 DIAGNOSIS — R7982 Elevated C-reactive protein (CRP): Secondary | ICD-10-CM | POA: Diagnosis present

## 2023-01-25 DIAGNOSIS — D259 Leiomyoma of uterus, unspecified: Secondary | ICD-10-CM | POA: Diagnosis not present

## 2023-01-25 DIAGNOSIS — Z888 Allergy status to other drugs, medicaments and biological substances status: Secondary | ICD-10-CM | POA: Diagnosis not present

## 2023-01-25 DIAGNOSIS — K2270 Barrett's esophagus without dysplasia: Secondary | ICD-10-CM | POA: Diagnosis present

## 2023-01-25 DIAGNOSIS — I129 Hypertensive chronic kidney disease with stage 1 through stage 4 chronic kidney disease, or unspecified chronic kidney disease: Secondary | ICD-10-CM | POA: Diagnosis not present

## 2023-01-25 DIAGNOSIS — K2970 Gastritis, unspecified, without bleeding: Secondary | ICD-10-CM | POA: Diagnosis present

## 2023-01-25 DIAGNOSIS — Z79899 Other long term (current) drug therapy: Secondary | ICD-10-CM

## 2023-01-25 DIAGNOSIS — Z6829 Body mass index (BMI) 29.0-29.9, adult: Secondary | ICD-10-CM

## 2023-01-25 DIAGNOSIS — Z7982 Long term (current) use of aspirin: Secondary | ICD-10-CM

## 2023-01-25 DIAGNOSIS — Z7985 Long-term (current) use of injectable non-insulin antidiabetic drugs: Secondary | ICD-10-CM

## 2023-01-25 DIAGNOSIS — N183 Chronic kidney disease, stage 3 unspecified: Secondary | ICD-10-CM | POA: Diagnosis not present

## 2023-01-25 DIAGNOSIS — J45909 Unspecified asthma, uncomplicated: Secondary | ICD-10-CM | POA: Diagnosis not present

## 2023-01-25 DIAGNOSIS — I25119 Atherosclerotic heart disease of native coronary artery with unspecified angina pectoris: Secondary | ICD-10-CM | POA: Diagnosis present

## 2023-01-25 DIAGNOSIS — Z7984 Long term (current) use of oral hypoglycemic drugs: Secondary | ICD-10-CM

## 2023-01-25 DIAGNOSIS — R079 Chest pain, unspecified: Principal | ICD-10-CM | POA: Diagnosis present

## 2023-01-25 DIAGNOSIS — Z8249 Family history of ischemic heart disease and other diseases of the circulatory system: Secondary | ICD-10-CM

## 2023-01-25 DIAGNOSIS — E1165 Type 2 diabetes mellitus with hyperglycemia: Secondary | ICD-10-CM | POA: Diagnosis present

## 2023-01-25 DIAGNOSIS — Z811 Family history of alcohol abuse and dependence: Secondary | ICD-10-CM

## 2023-01-25 DIAGNOSIS — Z96651 Presence of right artificial knee joint: Secondary | ICD-10-CM | POA: Diagnosis present

## 2023-01-25 DIAGNOSIS — F419 Anxiety disorder, unspecified: Secondary | ICD-10-CM | POA: Diagnosis present

## 2023-01-25 DIAGNOSIS — Z01810 Encounter for preprocedural cardiovascular examination: Secondary | ICD-10-CM | POA: Diagnosis not present

## 2023-01-25 DIAGNOSIS — M7989 Other specified soft tissue disorders: Secondary | ICD-10-CM | POA: Diagnosis not present

## 2023-01-25 DIAGNOSIS — K317 Polyp of stomach and duodenum: Secondary | ICD-10-CM | POA: Diagnosis not present

## 2023-01-25 DIAGNOSIS — Z9641 Presence of insulin pump (external) (internal): Secondary | ICD-10-CM | POA: Diagnosis present

## 2023-01-25 DIAGNOSIS — I2510 Atherosclerotic heart disease of native coronary artery without angina pectoris: Secondary | ICD-10-CM | POA: Diagnosis not present

## 2023-01-25 DIAGNOSIS — Z885 Allergy status to narcotic agent status: Secondary | ICD-10-CM

## 2023-01-25 LAB — BASIC METABOLIC PANEL
Anion gap: 9 (ref 5–15)
BUN: 21 mg/dL (ref 8–23)
CO2: 24 mmol/L (ref 22–32)
Calcium: 8.9 mg/dL (ref 8.9–10.3)
Chloride: 107 mmol/L (ref 98–111)
Creatinine, Ser: 1.34 mg/dL — ABNORMAL HIGH (ref 0.44–1.00)
GFR, Estimated: 43 mL/min — ABNORMAL LOW (ref >60)
Glucose, Bld: 130 mg/dL — ABNORMAL HIGH (ref 70–99)
Potassium: 4 mmol/L (ref 3.5–5.1)
Sodium: 140 mmol/L (ref 135–145)

## 2023-01-25 LAB — HEPATIC FUNCTION PANEL
ALT: 30 U/L (ref 0–44)
AST: 27 U/L (ref 15–41)
Albumin: 3.1 g/dL — ABNORMAL LOW (ref 3.5–5.0)
Alkaline Phosphatase: 58 U/L (ref 38–126)
Bilirubin, Direct: 0.2 mg/dL (ref 0.0–0.2)
Indirect Bilirubin: 0.7 mg/dL (ref 0.3–0.9)
Total Bilirubin: 0.9 mg/dL (ref 0.3–1.2)
Total Protein: 6.4 g/dL — ABNORMAL LOW (ref 6.5–8.1)

## 2023-01-25 LAB — CBC
HCT: 33.4 % — ABNORMAL LOW (ref 36.0–46.0)
Hemoglobin: 10.4 g/dL — ABNORMAL LOW (ref 12.0–15.0)
MCH: 29.1 pg (ref 26.0–34.0)
MCHC: 31.1 g/dL (ref 30.0–36.0)
MCV: 93.6 fL (ref 80.0–100.0)
Platelets: 172 10*3/uL (ref 150–400)
RBC: 3.57 MIL/uL — ABNORMAL LOW (ref 3.87–5.11)
RDW: 13.2 % (ref 11.5–15.5)
WBC: 7.3 10*3/uL (ref 4.0–10.5)
nRBC: 0 % (ref 0.0–0.2)

## 2023-01-25 LAB — HEMOGLOBIN A1C
Hgb A1c MFr Bld: 7.4 % — ABNORMAL HIGH (ref 4.8–5.6)
Mean Plasma Glucose: 165.68 mg/dL

## 2023-01-25 LAB — TROPONIN I (HIGH SENSITIVITY)
Troponin I (High Sensitivity): 4 ng/L (ref <18)
Troponin I (High Sensitivity): 4 ng/L (ref <18)

## 2023-01-25 LAB — LIPASE, BLOOD: Lipase: 33 U/L (ref 11–51)

## 2023-01-25 MED ORDER — HYDROMORPHONE HCL 1 MG/ML IJ SOLN
1.0000 mg | Freq: Once | INTRAMUSCULAR | Status: AC
  Administered 2023-01-25: 1 mg via INTRAVENOUS
  Filled 2023-01-25: qty 1

## 2023-01-25 MED ORDER — ALUM & MAG HYDROXIDE-SIMETH 200-200-20 MG/5ML PO SUSP
30.0000 mL | Freq: Once | ORAL | Status: AC
  Administered 2023-01-25: 30 mL via ORAL
  Filled 2023-01-25: qty 30

## 2023-01-25 MED ORDER — CARVEDILOL 6.25 MG PO TABS
6.2500 mg | ORAL_TABLET | Freq: Two times a day (BID) | ORAL | Status: DC
  Administered 2023-01-26 – 2023-01-30 (×9): 6.25 mg via ORAL
  Filled 2023-01-25 (×4): qty 1
  Filled 2023-01-25: qty 2
  Filled 2023-01-25 (×4): qty 1

## 2023-01-25 MED ORDER — HYDROMORPHONE HCL 1 MG/ML IJ SOLN
0.5000 mg | Freq: Once | INTRAMUSCULAR | Status: AC
  Administered 2023-01-25: 0.5 mg via INTRAVENOUS
  Filled 2023-01-25: qty 1

## 2023-01-25 MED ORDER — OXYCODONE HCL 5 MG PO TABS
5.0000 mg | ORAL_TABLET | ORAL | Status: DC | PRN
  Administered 2023-01-26 – 2023-01-30 (×10): 5 mg via ORAL
  Filled 2023-01-25 (×10): qty 1

## 2023-01-25 MED ORDER — LIDOCAINE VISCOUS HCL 2 % MT SOLN
15.0000 mL | Freq: Once | OROMUCOSAL | Status: AC
  Administered 2023-01-25: 15 mL via ORAL
  Filled 2023-01-25: qty 15

## 2023-01-25 MED ORDER — ACETAMINOPHEN 650 MG RE SUPP: 650.0000 mg | Freq: Four times a day (QID) | RECTAL | Status: DC | PRN

## 2023-01-25 MED ORDER — HYDRALAZINE HCL 20 MG/ML IJ SOLN: 10.0000 mg | Freq: Four times a day (QID) | INTRAMUSCULAR | Status: DC | PRN

## 2023-01-25 MED ORDER — FAMOTIDINE IN NACL 20-0.9 MG/50ML-% IV SOLN
20.0000 mg | Freq: Once | INTRAVENOUS | Status: AC
  Administered 2023-01-25: 20 mg via INTRAVENOUS
  Filled 2023-01-25: qty 50

## 2023-01-25 MED ORDER — IOHEXOL 350 MG/ML SOLN
100.0000 mL | Freq: Once | INTRAVENOUS | Status: AC | PRN
  Administered 2023-01-25: 100 mL via INTRAVENOUS

## 2023-01-25 MED ORDER — TRAZODONE HCL 50 MG PO TABS: 25.0000 mg | ORAL_TABLET | Freq: Every evening | ORAL | Status: DC | PRN

## 2023-01-25 MED ORDER — RANOLAZINE ER 500 MG PO TB12
1000.0000 mg | ORAL_TABLET | Freq: Two times a day (BID) | ORAL | Status: DC
  Administered 2023-01-25 – 2023-01-30 (×10): 1000 mg via ORAL
  Filled 2023-01-25 (×11): qty 2

## 2023-01-25 MED ORDER — ONDANSETRON HCL 4 MG/2ML IJ SOLN: 4.0000 mg | Freq: Four times a day (QID) | INTRAMUSCULAR | Status: DC | PRN

## 2023-01-25 MED ORDER — ENOXAPARIN SODIUM 40 MG/0.4ML IJ SOSY
40.0000 mg | PREFILLED_SYRINGE | INTRAMUSCULAR | Status: DC
Start: 2023-01-26 — End: 2023-01-30
  Administered 2023-01-26 – 2023-01-29 (×4): 40 mg via SUBCUTANEOUS
  Filled 2023-01-25 (×4): qty 0.4

## 2023-01-25 MED ORDER — ACETAMINOPHEN 325 MG PO TABS
650.0000 mg | ORAL_TABLET | Freq: Four times a day (QID) | ORAL | Status: DC | PRN
  Filled 2023-01-25: qty 2

## 2023-01-25 MED ORDER — INSULIN ASPART 100 UNIT/ML IJ SOLN
0.0000 [IU] | Freq: Three times a day (TID) | INTRAMUSCULAR | Status: DC
  Administered 2023-01-26: 3 [IU] via SUBCUTANEOUS
  Administered 2023-01-26: 2 [IU] via SUBCUTANEOUS
  Administered 2023-01-26: 5 [IU] via SUBCUTANEOUS

## 2023-01-25 MED ORDER — HYDROMORPHONE HCL 1 MG/ML IJ SOLN
0.5000 mg | INTRAMUSCULAR | Status: AC | PRN
  Administered 2023-01-25 – 2023-01-26 (×3): 0.5 mg via INTRAVENOUS
  Filled 2023-01-25 (×3): qty 1

## 2023-01-25 MED ORDER — PANTOPRAZOLE SODIUM 40 MG IV SOLR
40.0000 mg | Freq: Two times a day (BID) | INTRAVENOUS | Status: DC
  Administered 2023-01-25 – 2023-01-28 (×6): 40 mg via INTRAVENOUS
  Filled 2023-01-25 (×6): qty 10

## 2023-01-25 MED ORDER — ESCITALOPRAM OXALATE 10 MG PO TABS
10.0000 mg | ORAL_TABLET | Freq: Every day | ORAL | Status: DC
  Administered 2023-01-25 – 2023-01-29 (×5): 10 mg via ORAL
  Filled 2023-01-25 (×5): qty 1

## 2023-01-25 MED ORDER — ONDANSETRON HCL 4 MG PO TABS
4.0000 mg | ORAL_TABLET | Freq: Four times a day (QID) | ORAL | Status: DC | PRN
Start: 2023-01-25 — End: 2023-01-30
  Administered 2023-01-29: 4 mg via ORAL
  Filled 2023-01-25: qty 1

## 2023-01-25 NOTE — ED Provider Notes (Addendum)
[place] EMERGENCY DEPARTMENT AT [facility] Provider Note   CSN: [id number] Arrival date & time: 01/25/23  5784     History  Chief Complaint  Patient presents with   Chest Pain    Karen Gardner is a 67 y.o. female.  67 year old female with a history of CAD without any intervention, hypertension, hyperlipidemia, stage III CKD, and type 2 diabetes who presents emergency department with chest pain.  This morning between 6 and 7 AM started experiencing severe sharp substernal chest pain.  Radiates down to her abdomen and back.  10/10.  Worsened with breathing, moving, and thinks it may be exertional.  Took 325 aspirin and nitroglycerin prior to coming in.  Says that it did not touch the pain.  Feels different than her usual angina.  Had a heart catheterization but showed nonintervenable CAD.  Denies any diaphoresis, vomiting, or shortness of breath.       Home Medications Prior to Admission medications   Medication Sig Start Date End Date Taking? Authorizing Provider  amLODipine (NORVASC) 10 MG tablet Take 1 tablet (10 mg total) by mouth daily. 07/19/22  Yes Drema Dallas, MD  aspirin EC 81 MG tablet Take 1 tablet (81 mg total) by mouth daily. 30 minutes prior to taking Isosorbide Patient taking differently: Take 325 mg by mouth daily. 30 minutes prior to taking Isosorbide 03/03/21  Yes Marykay Lex, MD  atorvastatin (LIPITOR) 80 MG tablet Take 1 tablet (80 mg total) by mouth at bedtime. 07/31/22  Yes Robet Leu R, PA-C  carvedilol (COREG) 6.25 MG tablet TAKE 1 TABLET BY MOUTH TWICE DAILY 12/15/22  Yes Marykay Lex, MD  Dulaglutide (TRULICITY) 0.75 MG/0.5ML SOPN Inject 0.75 mg into the skin once a week. Sundays   Yes [provider]  escitalopram (LEXAPRO) 10 MG tablet Take 10 mg by mouth at bedtime.    Yes [provider]  fluticasone (FLONASE) 50 MCG/ACT nasal spray Place 1 spray into both nostrils daily.   Yes [provider]   insulin lispro (HUMALOG) 100 UNIT/ML KwikPen Inject 8 Units into the skin 3 (three) times daily.   Yes [provider]  irbesartan (AVAPRO) 300 MG tablet Take 1 tablet (300 mg total) by mouth daily. Take at bedtime 10/13/22 10/13/23 Yes Monge, Petra Kuba, NP  isosorbide mononitrate (IMDUR) 60 MG 24 hr tablet Take 1 tablet (60 mg total) by mouth daily. 01/05/23  Yes Monge, Petra Kuba, NP  lansoprazole (PREVACID) 15 MG capsule Take 15 mg by mouth at bedtime.    Yes [provider]  latanoprost (XALATAN) 0.005 % ophthalmic solution Place 1 drop into both eyes at bedtime. 07/30/22  Yes [provider]  metFORMIN (GLUCOPHAGE-XR) 500 MG 24 hr tablet Take 250 mg by mouth daily with breakfast.   Yes [provider]  Multiple Vitamin (MULTIVITAMIN WITH MINERALS) TABS tablet Take 1 tablet by mouth 2 (two) times daily.   Yes [provider]  nitroGLYCERIN (NITROSTAT) 0.4 MG SL tablet DISSOLVE 1 TABLET UNDER TONGUE EVERY 5 MINUTES AS NEEDED FOR CHESTPAIN 10/03/22  Yes Marykay Lex, MD  ondansetron (ZOFRAN) 4 MG tablet Take 1 tablet (4 mg total) by mouth every 6 (six) hours as needed for nausea. 07/18/22  Yes Drema Dallas, MD  ranolazine (RANEXA) 1000 MG SR tablet Take 1 tablet (1,000 mg total) by mouth 2 (two) times daily. 07/31/22  Yes Jonita Albee, PA-C  traZODone (DESYREL) 50 MG tablet Take 0.5 tablets (25 mg  total) by mouth at bedtime as needed for sleep. 07/18/22  Yes Drema Dallas, MD  Continuous Blood Gluc Sensor (FREESTYLE LIBRE 14 DAY SENSOR) MISC Apply topically every 14 (fourteen) days. 04/03/21   [provider]  glucose blood (ONETOUCH VERIO) test strip Use as instructed to check blood sugar once a day dx code E11.65 02/04/14   Reather Littler, MD      Allergies    Codeine, Lactose intolerance (gi), and Oatmeal    Review of Systems   Review of Systems  Physical Exam Updated Vital Signs BP (!) 146/88   Pulse 83   Temp 98.6 F (37 C) (Oral)    Resp 20   Ht 5\' 5"  (1.651 m)   Wt 81.6 kg   SpO2 94%   BMI 29.95 kg/m  Physical Exam Vitals and nursing note reviewed.  Constitutional:      General: She is not in acute distress.    Appearance: She is well-developed.  HENT:     Head: Normocephalic and atraumatic.     Right Ear: External ear normal.     Left Ear: External ear normal.     Nose: Nose normal.  Eyes:     Extraocular Movements: Extraocular movements intact.     Conjunctiva/sclera: Conjunctivae normal.     Pupils: Pupils are equal, round, and reactive to light.  Cardiovascular:     Rate and Rhythm: Normal rate and regular rhythm.     Heart sounds: No murmur heard.    Comments: Chest pain reproducible.  Radial pulses 2+ bilaterally Pulmonary:     Effort: Pulmonary effort is normal. No respiratory distress.     Breath sounds: Normal breath sounds.  Abdominal:     General: Abdomen is flat. There is no distension.     Palpations: Abdomen is soft. There is no mass.     Tenderness: There is no abdominal tenderness. There is no guarding.  Musculoskeletal:     Cervical back: Normal range of motion and neck supple.     Right lower leg: No edema.     Left lower leg: No edema.  Skin:    General: Skin is warm and dry.  Neurological:     Mental Status: She is alert and oriented to person, place, and time. Mental status is at baseline.  Psychiatric:        Mood and Affect: Mood normal.     ED Results / Procedures / Treatments   Labs (all labs ordered are listed, but only abnormal results are displayed) Labs Reviewed  BASIC METABOLIC PANEL - Abnormal; Notable for the following components:      Result Value   Glucose, Bld 130 (*)    Creatinine, Ser 1.34 (*)    GFR, Estimated 43 (*)    All other components within normal limits  CBC - Abnormal; Notable for the following components:   RBC 3.57 (*)    Hemoglobin 10.4 (*)    HCT 33.4 (*)    All other components within normal limits  HEPATIC FUNCTION PANEL -  Abnormal; Notable for the following components:   Total Protein 6.4 (*)    Albumin 3.1 (*)    All other components within normal limits  HEMOGLOBIN A1C - Abnormal; Notable for the following components:   Hgb A1c MFr Bld 7.4 (*)    All other components within normal limits  LIPASE, BLOOD  COMPREHENSIVE METABOLIC PANEL  CBC  PROTIME-INR  TROPONIN I (HIGH SENSITIVITY)  TROPONIN I (HIGH SENSITIVITY)  EKG EKG Interpretation Date/Time:  Thursday January 25 2023 14:41:34 EDT Ventricular Rate:  84 PR Interval:  139 QRS Duration:  86 QT Interval:  368 QTC Calculation: 435 R Axis:   12  Text Interpretation: Sinus rhythm Confirmed by Vonita Moss [phone]) on 01/25/2023 3:14:09 PM  Radiology CT ANGIO CHEST/ABD/PEL FOR DISSECTION W &/OR WO CONTRAST  Result Date: 01/25/2023 CLINICAL DATA:  Chest pain EXAM: CT ANGIOGRAPHY CHEST, ABDOMEN AND PELVIS TECHNIQUE: Non-contrast CT of the chest was initially obtained. Multidetector CT imaging through the chest, abdomen and pelvis was performed using the standard protocol during bolus administration of intravenous contrast. Multiplanar reconstructed images and MIPs were obtained and reviewed to evaluate the vascular anatomy. RADIATION DOSE REDUCTION: This exam was performed according to the departmental dose-optimization program which includes automated exposure control, adjustment of the mA and/or kV according to patient size and/or use of iterative reconstruction technique. CONTRAST:  OMNIPAQUE IOHEXOL 350 MG/ML SOLN COMPARISON:  07/16/2022 FINDINGS: CTA CHEST FINDINGS VASCULAR Aorta: Satisfactory opacification of the aorta. Normal contour and caliber of the thoracic aorta. No evidence of aneurysm, dissection, or other acute aortic pathology. Mild mixed calcific atherosclerosis. Cardiovascular: No evidence of pulmonary embolism on limited non-tailored examination. Normal heart size. Three-vessel coronary artery calcifications and or stents. No  pericardial effusion. Review of the MIP images confirms the above findings. NON VASCULAR Mediastinum/Nodes: No enlarged mediastinal, hilar, or axillary lymph nodes. Moderate hiatal hernia with intrathoracic position of the gastric fundus. Thyroid gland, trachea, and esophagus demonstrate no significant findings. Lungs/Pleura: Lungs are clear. No pleural effusion or pneumothorax. Musculoskeletal: No chest wall abnormality. No acute osseous findings. Review of the MIP images confirms the above findings. CTA ABDOMEN AND PELVIS FINDINGS VASCULAR Normal contour and caliber of the abdominal aorta. No evidence of aneurysm, dissection, or other acute aortic pathology. Replaced right hepatic artery arising from the superior mesenteric axis, with otherwise standard branching pattern of the abdominal aorta. Mild mixed calcific atherosclerosis. Review of the MIP images confirms the above findings. NON-VASCULAR Hepatobiliary: No solid liver abnormality is seen. No gallstones, gallbladder wall thickening, or biliary dilatation. Pancreas: Unremarkable. No pancreatic ductal dilatation or surrounding inflammatory changes. Spleen: Normal in size without significant abnormality. Adrenals/Urinary Tract: Adrenal glands are unremarkable. Kidneys are normal, without renal calculi, solid lesion, or hydronephrosis. Bladder is unremarkable. Stomach/Bowel: Status post partial sleeve gastrectomy. Appendix appears normal. No evidence of bowel wall thickening, distention, or inflammatory changes. Lymphatic: No enlarged abdominal or pelvic lymph nodes. Reproductive: Calcified uterine fibroids. Other: No abdominal wall hernia or abnormality. No ascites. Musculoskeletal: No acute osseous findings. IMPRESSION: 1. Normal contour and caliber of the thoracic and abdominal aorta. No evidence of aneurysm, dissection, or other acute aortic pathology. Mild mixed calcific atherosclerosis. 2. Coronary artery disease. 3. Moderate hiatal hernia with  intrathoracic position of the gastric fundus. 4. Status post partial sleeve gastrectomy. 5. Calcified uterine fibroids. Electronically Signed   By: Jearld Lesch M.D.   On: 01/25/2023 13:53   DG Chest 2 View  Result Date: 01/25/2023 CLINICAL DATA:  Chest pain EXAM: CHEST - 2 VIEW COMPARISON:  07/16/2022 FINDINGS: Cardiac and mediastinal contours are within normal limits. No focal pulmonary opacity. No pleural effusion or pneumothorax. No acute osseous abnormality. IMPRESSION: No acute cardiopulmonary process. Electronically Signed   By: Wiliam Ke M.D.   On: 01/25/2023 11:45    Procedures Procedures    Medications Ordered in ED Medications  HYDROmorphone (DILAUDID) injection 0.5 mg (0.5 mg Intravenous Given 01/25/23 2011)  pantoprazole (PROTONIX) injection  40 mg (40 mg Intravenous Given 01/25/23 1916)  ranolazine (RANEXA) 12 hr tablet 1,000 mg (has no administration in time range)  escitalopram (LEXAPRO) tablet 10 mg (has no administration in time range)  traZODone (DESYREL) tablet 25 mg (has no administration in time range)  hydrALAZINE (APRESOLINE) injection 10 mg (has no administration in time range)  insulin aspart (novoLOG) injection 0-9 Units (has no administration in time range)  enoxaparin (LOVENOX) injection 40 mg (has no administration in time range)  acetaminophen (TYLENOL) tablet 650 mg (has no administration in time range)    Or  acetaminophen (TYLENOL) suppository 650 mg (has no administration in time range)  oxyCODONE (Oxy IR/ROXICODONE) immediate release tablet 5 mg (has no administration in time range)  ondansetron (ZOFRAN) tablet 4 mg (has no administration in time range)    Or  ondansetron (ZOFRAN) injection 4 mg (has no administration in time range)  carvedilol (COREG) tablet 6.25 mg (has no administration in time range)  HYDROmorphone (DILAUDID) injection 0.5 mg (0.5 mg Intravenous Given 01/25/23 1138)  iohexol (OMNIPAQUE) 350 MG/ML injection 100 mL (100 mLs  Intravenous Contrast Given 01/25/23 1236)  HYDROmorphone (DILAUDID) injection 1 mg (1 mg Intravenous Given 01/25/23 1347)  alum & mag hydroxide-simeth (MAALOX/MYLANTA) 200-200-20 MG/5ML suspension 30 mL (30 mLs Oral Given 01/25/23 1426)    And  lidocaine (XYLOCAINE) 2 % viscous mouth solution 15 mL (15 mLs Oral Given 01/25/23 1426)  famotidine (PEPCID) IVPB 20 mg premix (0 mg Intravenous Stopped 01/25/23 1900)  HYDROmorphone (DILAUDID) injection 0.5 mg (0.5 mg Intravenous Given 01/25/23 1802)  alum & mag hydroxide-simeth (MAALOX/MYLANTA) 200-200-20 MG/5ML suspension 30 mL (30 mLs Oral Given 01/25/23 1915)    And  lidocaine (XYLOCAINE) 2 % viscous mouth solution 15 mL (15 mLs Oral Given 01/25/23 1915)    ED Course/ Medical Decision Making/ A&P Clinical Course as of 01/25/23 2016  Thu Jan 25, 2023  1421 CT ANGIO CHEST/ABD/PEL FOR DISSECTION W &/OR WO CONTRAST Moderate hiatal hernia with intrathoracic position of the gastric fundus. Status post partial sleeve gastrectomy.   [RP]  1730 Dr Rito Ehrlich from hospitalist to admit the patient [RP]    Clinical Course User Index [RP] Rondel Baton, MD                                 Medical Decision Making Amount and/or Complexity of Data Reviewed Labs: ordered. Radiology: ordered. Decision-making details documented in ED Course.  Risk OTC drugs. Prescription drug management. Decision regarding hospitalization.   IAYANA FOXX is a 67 y.o. female with comorbidities that complicate the patient evaluation including CAD without any intervention, hypertension, hyperlipidemia, stage III CKD, and type 2 diabetes who presents emergency department with chest pain.    Initial Ddx:  ACS, dissection, gastritis/peptic ulcer, pancreatitis, MSK pain  MDM/Course:  Patient resents emergency department chest pain.  Not typical for angina though she repeatedly called angina on exam.  EKG and troponins did not show any signs of acute ischemia.  Had  a CTA dissection protocol which shows hiatal hernia but no dissection or pancreatitis or other acute findings to explain her symptoms.  Patient was given multiple rounds of pain medication and GI cocktail but upon re-evaluation was still having significant pain.  Unclear exactly what is causing her symptoms.  Felt less likely to be cardiac in nature but given her most recent cath did show cardiac disease and she is having very difficult to  control pain hospitalist was consulted for admission.  They have agreed to admit the patient for pain control and observation.  This patient presents to the ED for concern of complaints listed in HPI, this involves an extensive number of treatment options, and is a complaint that carries with it a high risk of complications and morbidity. Disposition including potential need for admission considered.   Dispo: Admit to Floor  Records reviewed Outpatient Clinic Notes The following labs were independently interpreted: Serial Troponins and show no acute abnormality I independently reviewed the following imaging with scope of interpretation limited to determining acute life threatening conditions related to emergency care: CT Chest and CT Abdomen/Pelvis and agree with the radiologist interpretation with the following exceptions: none I personally reviewed and interpreted cardiac monitoring: normal sinus rhythm  I personally reviewed and interpreted the pt's EKG: see above for interpretation  I have reviewed the patients home medications and made adjustments as needed Consults: Hospitalist Social Determinants of health:  Elderly  Portions of this note were generated with Scientist, clinical (histocompatibility and immunogenetics). Dictation errors may occur despite best attempts at proofreading.           Final Clinical Impression(s) / ED Diagnoses Final diagnoses:  Chest pain, unspecified type    Rx / DC Orders ED Discharge Orders     None         Rondel Baton, MD 01/25/23  2016    Rondel Baton, MD 01/25/23 2016

## 2023-01-25 NOTE — H&P (Addendum)
[facility] History and Physical  Karen Gardner UXL:[id number] DOB: 01-Apr-1956 DOA: 01/25/2023   PCP: [name], MD  Specialists: Dr. Herbie Baltimore is her cardiologist.  She is followed by Dr. Sharl Ma with endocrinology  Chief Complaint: Chest discomfort since this morning  HPI: Karen Gardner is a 67 y.o. female with a past medical history of essential hypertension, diabetes mellitus type 2, coronary artery disease, GERD, hyperlipidemia who was in her usual state of health till earlier today when she woke up from sleep with chest pain located in the central part of the chest.  She rates it at 10 out of 10 in intensity.  States that it is a sharp stabbing pain which occasionally feels like pressure.  Has been fairly constant since this morning but occasionally abatess.  When she developed the pain this morning she took 2 nitroglycerin without any relief.  She also took aspirin without any relief.  Pain gets worse with deep breathing.  Radiates to the right side of her chest.  She does admit to having acid reflux and takes Prevacid every night.  Supposed to be switched over to a new PPI but she does not know the name of it.  Denies any shortness of breath.  No fever or chills.  No nausea vomiting.  She does mention that she was in [place] recently and returned back home on Monday.  In the emergency department she underwent blood work EKG returned unremarkable.  CT angiogram ruled out dissection.  No PE was noted.  Troponins were noted to be normal.  She was given GI cocktail without any improvement in symptoms.  Due to persistent pain medical service was called for admission.  Home Medications: This list has not been reconciled yet. Prior to Admission medications   Medication Sig Start Date End Date Taking? Authorizing Provider  amLODipine (NORVASC) 10 MG tablet Take 1 tablet (10 mg total) by mouth daily. 07/19/22   Drema Dallas, MD  aspirin EC 81 MG tablet Take 1 tablet (81 mg  total) by mouth daily. 30 minutes prior to taking Isosorbide 03/03/21   Marykay Lex, MD  atorvastatin (LIPITOR) 80 MG tablet Take 1 tablet (80 mg total) by mouth at bedtime. 07/31/22   Jonita Albee, PA-C  CALCIUM PO Take 1 capsule by mouth 3 (three) times daily.     [provider]  carvedilol (COREG) 6.25 MG tablet TAKE 1 TABLET BY MOUTH TWICE DAILY 12/15/22   Marykay Lex, MD  Continuous Blood Gluc Sensor (FREESTYLE LIBRE 14 DAY SENSOR) MISC Apply topically every 14 (fourteen) days. 04/03/21   [provider]  Dulaglutide (TRULICITY) 0.75 MG/0.5ML SOPN Inject 0.75 mg into the skin once a week.    [provider]  escitalopram (LEXAPRO) 10 MG tablet Take 10 mg by mouth at bedtime.     [provider]  fluticasone (FLONASE) 50 MCG/ACT nasal spray Place 1 spray into both nostrils daily.    [provider]  glucose blood (ONETOUCH VERIO) test strip Use as instructed to check blood sugar once a day dx code E11.65 02/04/14   Reather Littler, MD  insulin lispro (HUMALOG) 100 UNIT/ML KwikPen Inject 8 Units into the skin 3 (three) times daily.    [provider]  irbesartan (AVAPRO) 300 MG tablet Take 1 tablet (300 mg total) by mouth daily. Take at bedtime 10/13/22 10/13/23  Joylene Grapes, NP  isosorbide mononitrate (IMDUR) 30 MG 24 hr tablet Take 1 tablet (30 mg total)  by mouth daily. 10/12/22   Joylene Grapes, NP  isosorbide mononitrate (IMDUR) 60 MG 24 hr tablet Take 1 tablet (60 mg total) by mouth daily. 01/05/23   Joylene Grapes, NP  lansoprazole (PREVACID) 15 MG capsule Take 15 mg by mouth at bedtime.     [provider]  latanoprost (XALATAN) 0.005 % ophthalmic solution 1 drop at bedtime. 07/30/22   [provider]  metFORMIN (GLUCOPHAGE) 500 MG tablet Take 500 mg by mouth at bedtime.    [provider]  Multiple Vitamin (MULTIVITAMIN WITH MINERALS) TABS tablet Take 1 tablet by mouth 2 (two) times daily.    [provider]  nitroGLYCERIN (NITROSTAT) 0.4 MG SL tablet DISSOLVE 1 TABLET UNDER TONGUE EVERY 5 MINUTES AS NEEDED FOR CHESTPAIN 10/03/22   Marykay Lex, MD  ondansetron (ZOFRAN) 4 MG tablet Take 1 tablet (4 mg total) by mouth every 6 (six) hours as needed for nausea. 07/18/22   Drema Dallas, MD  ranolazine (RANEXA) 1000 MG SR tablet Take 1 tablet (1,000 mg total) by mouth 2 (two) times daily. 07/31/22   Jonita Albee, PA-C  traZODone (DESYREL) 50 MG tablet Take 0.5 tablets (25 mg total) by mouth at bedtime as needed for sleep. 07/18/22   Drema Dallas, MD    Allergies:  Allergies  Allergen Reactions   Codeine Diarrhea   Lactose Intolerance (Gi) Diarrhea and Other (See Comments)    *Gas*    Oatmeal Other (See Comments)    "Gas"     Past Medical History: Past Medical History:  Diagnosis Date   Allergic rhinitis    Anxiety    Arthritis    Asthma    childhood   Chronic low back pain    CKD (chronic kidney disease), stage III (HCC)    However, most recent creatinine 1.5.   Coronary artery disease involving native heart with other form of angina pectoris, unspecified vessel or lesion type [facility]) 11/16/2020   11/16/2020: CORONARY CA++ SCORE: Agatston Score 2245.  LAD 1093, LCx 959, RCA 193 (99th percentile) -> COR CTA (Agatston 2437) -moderate to severe multivessel CAD with significant blooming in all 3 epicardial vessels.  CAD RADS 3-4. FFRCT suggests mid RCA occlusion, => CARDIAC CATH 02/02/21: RCA: prox 70%, Mid-distal 90% & distal 90% (small caliber, Not viable PCI target; Prox RI 50%.   Depression    With anxiety   GERD (gastroesophageal reflux disease)    Headache    sinus headaches    Hypercholesterolemia    Hypertension    Iron deficiency anemia 05/26/2015   Has required transfusions-followed by Dr. Myna Hidalgo   Iron malabsorption 05/26/2015   Menopause    Numbness in both hands    mostly at night   Obesity    Osteoarthritis of both knees    Status post right TKA  (and ankle) with plans for left TKA   Type 2 diabetes mellitus without complication, with long-term current use of insulin (HCC)    Type 2 on insulin pump   Vasovagal syncope 2018   Negative work-up   Vitamin D deficiency     Past Surgical History:  Procedure Laterality Date   COLONOSCOPY     EYE SURGERY Bilateral    [facility]    HERNIA REPAIR     umb hernia as child   KNEE ARTHROSCOPY  02/12/2012   Procedure: ARTHROSCOPY KNEE;  Surgeon: Nestor Lewandowsky, MD;  Location: [facility];  Service: Orthopedics;  Laterality:  Right;  Partial Lateral Meniscectomy, Debridement chondromalacia   LAPAROSCOPIC GASTRIC SLEEVE RESECTION N/A 03/14/2016   Procedure: LAPAROSCOPIC GASTRIC SLEEVE RESECTION, WITH REPAIR OF HIATAL HERNIA REPAI, AND UPPER ENDO;  Surgeon: Luretha Murphy, MD;  Location: [facility];  Service: General;  Laterality: N/A;   LEFT HEART CATH AND CORONARY ANGIOGRAPHY N/A 02/02/2021   Procedure: LEFT HEART CATH AND CORONARY ANGIOGRAPHY;  Surgeon: Marykay Lex, MD;  Location: [facility];; Heavily calcified tortuous codominant RCA with proximal 70% stenosis.  Mid to distal RCA 90% stenosis.  Distal RCA 90%.  Not a PCI target.  50% RI, proximal to mid LAD 20%.  Mid to distal LCx 30% with 30% in LP AV-heavily calcified.  EF 55-65%.  Mildly elevated LVEDP.   ORIF ANKLE FRACTURE Right 02/11/2014   Procedure: OPEN REDUCTION INTERNAL FIXATION (ORIF) RIGHT ANKLE FRACTURE;  Surgeon: Nestor Lewandowsky, MD;  Location: [facility];  Service: Orthopedics;  Laterality: Right;   TOTAL KNEE ARTHROPLASTY Right 08/05/2014   Procedure: TOTAL KNEE ARTHROPLASTY;  Surgeon: Gean Birchwood, MD;  Location: [facility];  Service: Orthopedics;  Laterality: Right;   TRANSTHORACIC ECHOCARDIOGRAM  05/2016   EF 60 to 65%.  Severe basal septal LVH with mild concentric LVH.  No R WMA.  GR 1 DD.  Indeterminate filling pressures. Minimal valve disease.   TRANSTHORACIC ECHOCARDIOGRAM  03/29/2021   EF 55 to 60%.  No R WMA-normal  strain pattern.  GR 1 DD.  Normal RV with RV P mild aortic sclerosis but no stenosis no AI.  Mildly elevated RAP.   TRIGGER FINGER RELEASE Right 10/14/2018   Procedure: RELEASE TRIGGER FINGER/A-1 PULLEY;  Surgeon: Betha Loa, MD;  Location: [facility];  Service: Orthopedics;  Laterality: Right;   UPPER GI ENDOSCOPY      Social History: Lives in [place].  No smoking alcohol use or illicit drug use.   Family History:  Family History  Problem Relation Age of Onset   Alcohol abuse Father    Heart attack Sister 43     Review of Systems - History obtained from the patient General ROS: positive for  - fatigue Psychological ROS: negative Ophthalmic ROS: negative ENT ROS: negative Allergy and Immunology ROS: negative Hematological and Lymphatic ROS: negative Endocrine ROS: negative Respiratory ROS: no cough, shortness of breath, or wheezing Cardiovascular ROS: As in HPI Gastrointestinal ROS: no abdominal pain, change in bowel habits, or black or bloody stools Genito-Urinary ROS: no dysuria, trouble voiding, or hematuria Musculoskeletal ROS: negative Neurological ROS: no TIA or stroke symptoms Dermatological ROS: negative  Physical Examination  Vitals:   01/25/23 1315 01/25/23 1317 01/25/23 1429 01/25/23 1645  BP: (!) 156/77   (!) 142/81  Pulse: 77   88  Resp: 16   (!) 21  Temp:   98.7 F (37.1 C)   TempSrc:   Oral   SpO2: 96%   94%  Weight:  81.6 kg    Height:  5\' 5"  (1.651 m)      BP (!) 142/81   Pulse 88   Temp 98.7 F (37.1 C) (Oral)   Resp (!) 21   Ht 5\' 5"  (1.651 m)   Wt 81.6 kg   SpO2 94%   BMI 29.95 kg/m   General appearance: alert, cooperative, appears stated age, and no distress Head: Normocephalic, without obvious abnormality, atraumatic Eyes: conjunctivae/corneas clear. PERRL, EOM's intact.  Throat: lips, mucosa, and tongue normal; teeth and gums normal Neck: no adenopathy, no carotid bruit, no JVD, supple,  symmetrical, trachea  midline, and thyroid not enlarged, symmetric, no tenderness/mass/nodules Back: symmetric, no curvature. ROM normal. No CVA tenderness. Resp: clear to auscultation bilaterally Chest wall: Chest wall was tender to palpate in the midline but did not clearly reproduce her symptoms. Cardio: regular rate and rhythm, S1, S2 normal, no murmur, click, rub or gallop GI: soft, non-tender; bowel sounds normal; no masses,  no organomegaly Extremities: extremities normal, atraumatic, no cyanosis or edema Pulses: 2+ and symmetric Skin: Skin color, texture, turgor normal. No rashes or lesions Lymph nodes: Cervical, supraclavicular, and axillary nodes normal. Neurologic: Alert and oriented x 3.  Cranial nerves II to XII intact.  Motor strength equal bilateral upper and lower extremities.   Labs on Admission: I have personally reviewed following labs and imaging studies  CBC: Recent Labs  Lab 01/25/23 1046  WBC 7.3  HGB 10.4*  HCT 33.4*  MCV 93.6  PLT 172   Basic Metabolic Panel: Recent Labs  Lab 01/25/23 1046  NA 140  K 4.0  CL 107  CO2 24  GLUCOSE 130*  BUN 21  CREATININE 1.34*  CALCIUM 8.9   GFR: Estimated Creatinine Clearance: 41.8 mL/min (A) (by C-G formula based on SCr of 1.34 mg/dL (H)).  Liver Function Tests: Recent Labs  Lab 01/25/23 1046  AST 27  ALT 30  ALKPHOS 58  BILITOT 0.9  PROT 6.4*  ALBUMIN 3.1*   Recent Labs  Lab 01/25/23 1046  LIPASE 33     Radiological Exams on Admission: CT ANGIO CHEST/ABD/PEL FOR DISSECTION W &/OR WO CONTRAST  Result Date: 01/25/2023 CLINICAL DATA:  Chest pain EXAM: CT ANGIOGRAPHY CHEST, ABDOMEN AND PELVIS TECHNIQUE: Non-contrast CT of the chest was initially obtained. Multidetector CT imaging through the chest, abdomen and pelvis was performed using the standard protocol during bolus administration of intravenous contrast. Multiplanar reconstructed images and MIPs were obtained and reviewed to evaluate the vascular anatomy.  RADIATION DOSE REDUCTION: This exam was performed according to the departmental dose-optimization program which includes automated exposure control, adjustment of the mA and/or kV according to patient size and/or use of iterative reconstruction technique. CONTRAST:  OMNIPAQUE IOHEXOL 350 MG/ML SOLN COMPARISON:  07/16/2022 FINDINGS: CTA CHEST FINDINGS VASCULAR Aorta: Satisfactory opacification of the aorta. Normal contour and caliber of the thoracic aorta. No evidence of aneurysm, dissection, or other acute aortic pathology. Mild mixed calcific atherosclerosis. Cardiovascular: No evidence of pulmonary embolism on limited non-tailored examination. Normal heart size. Three-vessel coronary artery calcifications and or stents. No pericardial effusion. Review of the MIP images confirms the above findings. NON VASCULAR Mediastinum/Nodes: No enlarged mediastinal, hilar, or axillary lymph nodes. Moderate hiatal hernia with intrathoracic position of the gastric fundus. Thyroid gland, trachea, and esophagus demonstrate no significant findings. Lungs/Pleura: Lungs are clear. No pleural effusion or pneumothorax. Musculoskeletal: No chest wall abnormality. No acute osseous findings. Review of the MIP images confirms the above findings. CTA ABDOMEN AND PELVIS FINDINGS VASCULAR Normal contour and caliber of the abdominal aorta. No evidence of aneurysm, dissection, or other acute aortic pathology. Replaced right hepatic artery arising from the superior mesenteric axis, with otherwise standard branching pattern of the abdominal aorta. Mild mixed calcific atherosclerosis. Review of the MIP images confirms the above findings. NON-VASCULAR Hepatobiliary: No solid liver abnormality is seen. No gallstones, gallbladder wall thickening, or biliary dilatation. Pancreas: Unremarkable. No pancreatic ductal dilatation or surrounding inflammatory changes. Spleen: Normal in size without significant abnormality. Adrenals/Urinary Tract:  Adrenal glands are unremarkable. Kidneys are normal, without renal calculi, solid lesion, or  hydronephrosis. Bladder is unremarkable. Stomach/Bowel: Status post partial sleeve gastrectomy. Appendix appears normal. No evidence of bowel wall thickening, distention, or inflammatory changes. Lymphatic: No enlarged abdominal or pelvic lymph nodes. Reproductive: Calcified uterine fibroids. Other: No abdominal wall hernia or abnormality. No ascites. Musculoskeletal: No acute osseous findings. IMPRESSION: 1. Normal contour and caliber of the thoracic and abdominal aorta. No evidence of aneurysm, dissection, or other acute aortic pathology. Mild mixed calcific atherosclerosis. 2. Coronary artery disease. 3. Moderate hiatal hernia with intrathoracic position of the gastric fundus. 4. Status post partial sleeve gastrectomy. 5. Calcified uterine fibroids. Electronically Signed   By: Jearld Lesch M.D.   On: 01/25/2023 13:53   DG Chest 2 View  Result Date: 01/25/2023 CLINICAL DATA:  Chest pain EXAM: CHEST - 2 VIEW COMPARISON:  07/16/2022 FINDINGS: Cardiac and mediastinal contours are within normal limits. No focal pulmonary opacity. No pleural effusion or pneumothorax. No acute osseous abnormality. IMPRESSION: No acute cardiopulmonary process. Electronically Signed   By: Wiliam Ke M.D.   On: 01/25/2023 11:45    My interpretation of Electrocardiogram: Sinus rhythm in the 80s.  Normal axis.  Intervals are normal.  No concerning ST or T wave changes.   Problem List  Principal Problem:   Chest pain Active Problems:   DM2 (diabetes mellitus, type 2) (HCC)   Essential hypertension   Coronary artery disease involving native coronary artery of native heart with angina pectoris (HCC)   Hiatal hernia   Assessment: This is a 67 year old female with past medical history as stated earlier who comes in with chest pain with onset this morning.  Differential diagnosis is broad.  She had a trip to [place] recently and  took a flight back home on Monday. But CT angiogram did not show any PE or dissection.  She does have a history of CAD but her symptoms are not very suggestive of angina.  There was no relief with nitroglycerin.  Troponin levels are normal.  EKG is unremarkable.  GI etiology is a possibility since she does have acid reflux.  The CT scan did show moderate hiatal hernia with intrathoracic position of the gastric fundus which could be contributing.  No gallstones noted.  Lipase was normal.  LFTs are normal.  Plan:  #1. Chest pain: Likely noncardiac etiology.  We will give her PPI and GI cocktail.  Clear liquids for now and n.p.o. past midnight.  Will reach out to gastroenterology to consider endoscopy.  Mild lower extremity edema is noted.  Will do Doppler studies to rule out DVT.  #2.  Coronary artery disease: Followed by Dr. Herbie Baltimore. Her last cardiac catheterization was in November 2022: moderate to severe diffusely calcified coronary arteries with severe single-vessel disease involving a small caliber codominant RCA with multiple segments of 70-90% stenosis was noted.  It was felt that this would be best treated medically.  She is on aspirin beta-blocker statin prior to admission.  Also on Ranexa which will be continued for now.  #3. Diabetes mellitus type 2: Monitor CBGs.  SSI.  Hold her metformin for now.  #4.  Essential hypertension: Monitor blood pressures.  Hydralazine as needed.  #5.  Hiatal hernia/status post gastric sleeve surgery: The gastric sleeve surgery was done in 2015 for weight loss purposes.  Hiatal hernia noted on CT scan which could be contributing to his symptoms.   DVT Prophylaxis: SCDs Code Status: Full code Family Communication: Discussed with patient Disposition: Hopefully return home when improved Consults called: Will inform gastroenterology Admission Status:  Status is: Observation The patient remains OBS appropriate and will d/c before 2 midnights.    Severity of  Illness: The appropriate patient status for this patient is OBSERVATION. Observation status is judged to be reasonable and necessary in order to provide the required intensity of service to ensure the patient's safety. The patient's presenting symptoms, physical exam findings, and initial radiographic and laboratory data in the context of their medical condition is felt to place them at decreased risk for further clinical deterioration. Furthermore, it is anticipated that the patient will be medically stable for discharge from the hospital within 2 midnights of admission.    Further management decisions will depend on results of further testing and patient's response to treatment.   Raynor Calc[employer]  [employer] [employer] on [email or web address]  01/25/2023, 6:11 PM

## 2023-01-25 NOTE — ED Triage Notes (Signed)
Pt BIB [facility] with reports of chest pain that woke her up from sleep. Pt reports moving makes the pain worse.

## 2023-01-26 ENCOUNTER — Observation Stay (HOSPITAL_COMMUNITY): Payer: Medicare Other

## 2023-01-26 DIAGNOSIS — K449 Diaphragmatic hernia without obstruction or gangrene: Secondary | ICD-10-CM | POA: Diagnosis present

## 2023-01-26 DIAGNOSIS — D631 Anemia in chronic kidney disease: Secondary | ICD-10-CM | POA: Diagnosis present

## 2023-01-26 DIAGNOSIS — K2970 Gastritis, unspecified, without bleeding: Secondary | ICD-10-CM | POA: Diagnosis present

## 2023-01-26 DIAGNOSIS — R1310 Dysphagia, unspecified: Secondary | ICD-10-CM | POA: Diagnosis not present

## 2023-01-26 DIAGNOSIS — I10 Essential (primary) hypertension: Secondary | ICD-10-CM | POA: Diagnosis not present

## 2023-01-26 DIAGNOSIS — R091 Pleurisy: Secondary | ICD-10-CM | POA: Diagnosis present

## 2023-01-26 DIAGNOSIS — E7800 Pure hypercholesterolemia, unspecified: Secondary | ICD-10-CM | POA: Diagnosis present

## 2023-01-26 DIAGNOSIS — I129 Hypertensive chronic kidney disease with stage 1 through stage 4 chronic kidney disease, or unspecified chronic kidney disease: Secondary | ICD-10-CM | POA: Diagnosis present

## 2023-01-26 DIAGNOSIS — Z96651 Presence of right artificial knee joint: Secondary | ICD-10-CM | POA: Diagnosis present

## 2023-01-26 DIAGNOSIS — I309 Acute pericarditis, unspecified: Secondary | ICD-10-CM | POA: Diagnosis present

## 2023-01-26 DIAGNOSIS — N183 Chronic kidney disease, stage 3 unspecified: Secondary | ICD-10-CM | POA: Diagnosis not present

## 2023-01-26 DIAGNOSIS — K219 Gastro-esophageal reflux disease without esophagitis: Secondary | ICD-10-CM | POA: Diagnosis present

## 2023-01-26 DIAGNOSIS — E1122 Type 2 diabetes mellitus with diabetic chronic kidney disease: Secondary | ICD-10-CM | POA: Diagnosis present

## 2023-01-26 DIAGNOSIS — Z9641 Presence of insulin pump (external) (internal): Secondary | ICD-10-CM | POA: Diagnosis present

## 2023-01-26 DIAGNOSIS — J45909 Unspecified asthma, uncomplicated: Secondary | ICD-10-CM | POA: Diagnosis present

## 2023-01-26 DIAGNOSIS — R071 Chest pain on breathing: Secondary | ICD-10-CM | POA: Diagnosis not present

## 2023-01-26 DIAGNOSIS — Z9884 Bariatric surgery status: Secondary | ICD-10-CM | POA: Diagnosis not present

## 2023-01-26 DIAGNOSIS — F32A Depression, unspecified: Secondary | ICD-10-CM | POA: Diagnosis present

## 2023-01-26 DIAGNOSIS — R0789 Other chest pain: Secondary | ICD-10-CM

## 2023-01-26 DIAGNOSIS — F419 Anxiety disorder, unspecified: Secondary | ICD-10-CM | POA: Diagnosis present

## 2023-01-26 DIAGNOSIS — M7989 Other specified soft tissue disorders: Secondary | ICD-10-CM | POA: Diagnosis not present

## 2023-01-26 DIAGNOSIS — N1831 Chronic kidney disease, stage 3a: Secondary | ICD-10-CM | POA: Diagnosis present

## 2023-01-26 DIAGNOSIS — Z87891 Personal history of nicotine dependence: Secondary | ICD-10-CM | POA: Diagnosis not present

## 2023-01-26 DIAGNOSIS — E119 Type 2 diabetes mellitus without complications: Secondary | ICD-10-CM | POA: Diagnosis not present

## 2023-01-26 DIAGNOSIS — R079 Chest pain, unspecified: Secondary | ICD-10-CM | POA: Diagnosis not present

## 2023-01-26 DIAGNOSIS — T380X5A Adverse effect of glucocorticoids and synthetic analogues, initial encounter: Secondary | ICD-10-CM | POA: Diagnosis present

## 2023-01-26 DIAGNOSIS — Z794 Long term (current) use of insulin: Secondary | ICD-10-CM | POA: Diagnosis not present

## 2023-01-26 DIAGNOSIS — I300 Acute nonspecific idiopathic pericarditis: Secondary | ICD-10-CM | POA: Diagnosis not present

## 2023-01-26 DIAGNOSIS — K2289 Other specified disease of esophagus: Secondary | ICD-10-CM | POA: Diagnosis not present

## 2023-01-26 DIAGNOSIS — E66811 Obesity, class 1: Secondary | ICD-10-CM | POA: Diagnosis present

## 2023-01-26 DIAGNOSIS — K317 Polyp of stomach and duodenum: Secondary | ICD-10-CM | POA: Diagnosis not present

## 2023-01-26 DIAGNOSIS — Z01810 Encounter for preprocedural cardiovascular examination: Secondary | ICD-10-CM

## 2023-01-26 DIAGNOSIS — R7982 Elevated C-reactive protein (CRP): Secondary | ICD-10-CM | POA: Diagnosis present

## 2023-01-26 DIAGNOSIS — E1165 Type 2 diabetes mellitus with hyperglycemia: Secondary | ICD-10-CM | POA: Diagnosis present

## 2023-01-26 DIAGNOSIS — I25119 Atherosclerotic heart disease of native coronary artery with unspecified angina pectoris: Secondary | ICD-10-CM | POA: Diagnosis present

## 2023-01-26 DIAGNOSIS — Z888 Allergy status to other drugs, medicaments and biological substances status: Secondary | ICD-10-CM | POA: Diagnosis not present

## 2023-01-26 DIAGNOSIS — R52 Pain, unspecified: Secondary | ICD-10-CM | POA: Diagnosis present

## 2023-01-26 DIAGNOSIS — I2510 Atherosclerotic heart disease of native coronary artery without angina pectoris: Secondary | ICD-10-CM | POA: Diagnosis not present

## 2023-01-26 DIAGNOSIS — K2270 Barrett's esophagus without dysplasia: Secondary | ICD-10-CM | POA: Diagnosis present

## 2023-01-26 LAB — PROTIME-INR
INR: 1.1 (ref 0.8–1.2)
Prothrombin Time: 14.5 s (ref 11.4–15.2)

## 2023-01-26 LAB — COMPREHENSIVE METABOLIC PANEL
ALT: 29 U/L (ref 0–44)
AST: 21 U/L (ref 15–41)
Albumin: 3.2 g/dL — ABNORMAL LOW (ref 3.5–5.0)
Alkaline Phosphatase: 56 U/L (ref 38–126)
Anion gap: 8 (ref 5–15)
BUN: 15 mg/dL (ref 8–23)
CO2: 25 mmol/L (ref 22–32)
Calcium: 8.8 mg/dL — ABNORMAL LOW (ref 8.9–10.3)
Chloride: 100 mmol/L (ref 98–111)
Creatinine, Ser: 1.17 mg/dL — ABNORMAL HIGH (ref 0.44–1.00)
GFR, Estimated: 51 mL/min — ABNORMAL LOW (ref >60)
Glucose, Bld: 272 mg/dL — ABNORMAL HIGH (ref 70–99)
Potassium: 4 mmol/L (ref 3.5–5.1)
Sodium: 133 mmol/L — ABNORMAL LOW (ref 135–145)
Total Bilirubin: 0.9 mg/dL (ref 0.3–1.2)
Total Protein: 6.8 g/dL (ref 6.5–8.1)

## 2023-01-26 LAB — CBG MONITORING, ED
Glucose-Capillary: 184 mg/dL — ABNORMAL HIGH (ref 70–99)
Glucose-Capillary: 247 mg/dL — ABNORMAL HIGH (ref 70–99)
Glucose-Capillary: 259 mg/dL — ABNORMAL HIGH (ref 70–99)

## 2023-01-26 LAB — SEDIMENTATION RATE: Sed Rate: 34 mm/h — ABNORMAL HIGH (ref 0–22)

## 2023-01-26 LAB — CBC
HCT: 34.9 % — ABNORMAL LOW (ref 36.0–46.0)
Hemoglobin: 10.9 g/dL — ABNORMAL LOW (ref 12.0–15.0)
MCH: 29.7 pg (ref 26.0–34.0)
MCHC: 31.2 g/dL (ref 30.0–36.0)
MCV: 95.1 fL (ref 80.0–100.0)
Platelets: 154 10*3/uL (ref 150–400)
RBC: 3.67 MIL/uL — ABNORMAL LOW (ref 3.87–5.11)
RDW: 13.2 % (ref 11.5–15.5)
WBC: 9.5 10*3/uL (ref 4.0–10.5)
nRBC: 0 % (ref 0.0–0.2)

## 2023-01-26 LAB — GLUCOSE, CAPILLARY
Glucose-Capillary: 295 mg/dL — ABNORMAL HIGH (ref 70–99)
Glucose-Capillary: 309 mg/dL — ABNORMAL HIGH (ref 70–99)

## 2023-01-26 LAB — C-REACTIVE PROTEIN: CRP: 12.2 mg/dL — ABNORMAL HIGH (ref <1.0)

## 2023-01-26 MED ORDER — LIDOCAINE VISCOUS HCL 2 % MT SOLN
15.0000 mL | OROMUCOSAL | Status: DC | PRN
Start: 2023-01-26 — End: 2023-01-30
  Filled 2023-01-26: qty 15

## 2023-01-26 MED ORDER — ISOSORBIDE MONONITRATE ER 60 MG PO TB24
60.0000 mg | ORAL_TABLET | Freq: Every day | ORAL | Status: DC
  Administered 2023-01-26 – 2023-01-30 (×5): 60 mg via ORAL
  Filled 2023-01-26: qty 1
  Filled 2023-01-26: qty 2
  Filled 2023-01-26 (×3): qty 1

## 2023-01-26 MED ORDER — INSULIN ASPART 100 UNIT/ML IJ SOLN
0.0000 [IU] | Freq: Every day | INTRAMUSCULAR | Status: DC
  Administered 2023-01-26: 4 [IU] via SUBCUTANEOUS
  Administered 2023-01-27: 5 [IU] via SUBCUTANEOUS

## 2023-01-26 MED ORDER — AMLODIPINE BESYLATE 10 MG PO TABS
10.0000 mg | ORAL_TABLET | Freq: Every day | ORAL | Status: DC
  Administered 2023-01-26 – 2023-01-30 (×5): 10 mg via ORAL
  Filled 2023-01-26 (×2): qty 1
  Filled 2023-01-26: qty 2
  Filled 2023-01-26 (×2): qty 1

## 2023-01-26 MED ORDER — INSULIN ASPART 100 UNIT/ML IJ SOLN
0.0000 [IU] | Freq: Three times a day (TID) | INTRAMUSCULAR | Status: DC
  Administered 2023-01-27: 3 [IU] via SUBCUTANEOUS
  Administered 2023-01-27: 5 [IU] via SUBCUTANEOUS
  Administered 2023-01-28: 7 [IU] via SUBCUTANEOUS

## 2023-01-26 MED ORDER — LATANOPROST 0.005 % OP SOLN
1.0000 [drp] | Freq: Every day | OPHTHALMIC | Status: DC
Start: 2023-01-26 — End: 2023-01-30
  Administered 2023-01-26 – 2023-01-29 (×4): 1 [drp] via OPHTHALMIC
  Filled 2023-01-26: qty 2.5

## 2023-01-26 MED ORDER — HYDROMORPHONE HCL 1 MG/ML IJ SOLN
0.5000 mg | INTRAMUSCULAR | Status: AC | PRN
  Administered 2023-01-28 – 2023-01-29 (×3): 0.5 mg via INTRAVENOUS
  Filled 2023-01-26 (×3): qty 0.5

## 2023-01-26 NOTE — ED Notes (Signed)
Pt assisted with use of bedpan but urinated some prior to calling out. Pts linens, gown and absorbent pad changed. Pt repositioned in bed. After repositioning pt reports her pain at 9/10 and requests medication. PRN provided.

## 2023-01-26 NOTE — Consult Note (Signed)
[facility] Gastroenterology Consult  Referring Provider: No ref. provider found Primary Care Physician:  [name], MD Primary Gastroenterologist: Deboraha Sprang PCP  Reason for Consultation: Atypical chest pain   SUBJECTIVE:   HPI: Karen Gardner is a 67 y.o. female with past medical history significant for coronary artery disease status post left heart catheterization on 02/02/2021, hypertension, iron deficiency anemia, type 2 diabetes mellitus, asthma. Surgical history significant for gastric sleeve on 03/14/2016.   Presented to hospital on 01/25/23 with chief complaint of sudden onset of chest pain. She noted that she awoke from sleep and the pain began shortly thereafter. She had similar pain in the past which was ultimately related to angina, though this episode is more severe in intensity. She attempted nitroglycerin and aspirin (325 mg), both of which did not help her discomfort. She noted a history of GERD, she takes OTC Prilosec, she denied current pain feeling like acid reflux. Pain described as sharp in nature with pressure like sensation. She has pain with inspiration. She noted having roughly 6 month history of dysphagia to solids. She has not recently taken any large pills. Denied use of NSAIDs and powdered aspirin products.   Labs on presentation showed WBC 9.5, Hgb 10.9, PLT 154, Na 133, K 4.0, BUN/Cr 15/1.17, AST/ALT 21/29, ALP 56, total bilirubin 0.9.  CTA chest/abdomen and pelvis completed which showed no cholelithiasis, unremarkable pancreas, s/p partial sleeve gastrectomy, no bowel wall thickening, moderate sized hiatal hernia.   No prior EGD per patient.  Colonoscopy 02/02/2016 (Dr. Madilyn Fireman) normal, recommended to have repeat in 10 years.  No known family history colon cancer.   Past Medical History:  Diagnosis Date   Allergic rhinitis    Anxiety    Arthritis    Asthma    childhood   Chronic low back pain    CKD (chronic kidney disease), stage III (HCC)    However,  most recent creatinine 1.5.   Coronary artery disease involving native heart with other form of angina pectoris, unspecified vessel or lesion type [facility]) 11/16/2020   11/16/2020: CORONARY CA++ SCORE: Agatston Score 2245.  LAD 1093, LCx 959, RCA 193 (99th percentile) -> COR CTA (Agatston 2437) -moderate to severe multivessel CAD with significant blooming in all 3 epicardial vessels.  CAD RADS 3-4. FFRCT suggests mid RCA occlusion, => CARDIAC CATH 02/02/21: RCA: prox 70%, Mid-distal 90% & distal 90% (small caliber, Not viable PCI target; Prox RI 50%.   Depression    With anxiety   GERD (gastroesophageal reflux disease)    Headache    sinus headaches    Hypercholesterolemia    Hypertension    Iron deficiency anemia 05/26/2015   Has required transfusions-followed by Dr. Myna Hidalgo   Iron malabsorption 05/26/2015   Menopause    Numbness in both hands    mostly at night   Obesity    Osteoarthritis of both knees    Status post right TKA (and ankle) with plans for left TKA   Type 2 diabetes mellitus without complication, with long-term current use of insulin (HCC)    Type 2 on insulin pump   Vasovagal syncope 2018   Negative work-up   Vitamin D deficiency    Past Surgical History:  Procedure Laterality Date   COLONOSCOPY     EYE SURGERY Bilateral    [facility]    HERNIA REPAIR     umb hernia as child   KNEE ARTHROSCOPY  02/12/2012   Procedure: ARTHROSCOPY KNEE;  Surgeon: Nestor Lewandowsky, MD;  Location:  [facility];  Service: Orthopedics;  Laterality: Right;  Partial Lateral Meniscectomy, Debridement chondromalacia   LAPAROSCOPIC GASTRIC SLEEVE RESECTION N/A 03/14/2016   Procedure: LAPAROSCOPIC GASTRIC SLEEVE RESECTION, WITH REPAIR OF HIATAL HERNIA REPAI, AND UPPER ENDO;  Surgeon: Luretha Murphy, MD;  Location: [facility];  Service: General;  Laterality: N/A;   LEFT HEART CATH AND CORONARY ANGIOGRAPHY N/A 02/02/2021   Procedure: LEFT HEART CATH AND CORONARY ANGIOGRAPHY;  Surgeon: Marykay Lex, MD;  Location: [facility];; Heavily calcified tortuous codominant RCA with proximal 70% stenosis.  Mid to distal RCA 90% stenosis.  Distal RCA 90%.  Not a PCI target.  50% RI, proximal to mid LAD 20%.  Mid to distal LCx 30% with 30% in LP AV-heavily calcified.  EF 55-65%.  Mildly elevated LVEDP.   ORIF ANKLE FRACTURE Right 02/11/2014   Procedure: OPEN REDUCTION INTERNAL FIXATION (ORIF) RIGHT ANKLE FRACTURE;  Surgeon: Nestor Lewandowsky, MD;  Location: [facility] OR;  Service: Orthopedics;  Laterality: Right;   TOTAL KNEE ARTHROPLASTY Right 08/05/2014   Procedure: TOTAL KNEE ARTHROPLASTY;  Surgeon: Gean Birchwood, MD;  Location: [facility] OR;  Service: Orthopedics;  Laterality: Right;   TRANSTHORACIC ECHOCARDIOGRAM  05/2016   EF 60 to 65%.  Severe basal septal LVH with mild concentric LVH.  No R WMA.  GR 1 DD.  Indeterminate filling pressures. Minimal valve disease.   TRANSTHORACIC ECHOCARDIOGRAM  03/29/2021   EF 55 to 60%.  No R WMA-normal strain pattern.  GR 1 DD.  Normal RV with RV P mild aortic sclerosis but no stenosis no AI.  Mildly elevated RAP.   TRIGGER FINGER RELEASE Right 10/14/2018   Procedure: RELEASE TRIGGER FINGER/A-1 PULLEY;  Surgeon: Betha Loa, MD;  Location: [facility];  Service: Orthopedics;  Laterality: Right;   UPPER GI ENDOSCOPY     Prior to Admission medications   Medication Sig Start Date End Date Taking? Authorizing Provider  amLODipine (NORVASC) 10 MG tablet Take 1 tablet (10 mg total) by mouth daily. 07/19/22  Yes Drema Dallas, MD  aspirin EC 81 MG tablet Take 1 tablet (81 mg total) by mouth daily. 30 minutes prior to taking Isosorbide Patient taking differently: Take 325 mg by mouth daily. 30 minutes prior to taking Isosorbide 03/03/21  Yes Marykay Lex, MD  atorvastatin (LIPITOR) 80 MG tablet Take 1 tablet (80 mg total) by mouth at bedtime. 07/31/22  Yes Robet Leu R, PA-C  carvedilol (COREG) 6.25 MG tablet TAKE 1 TABLET BY MOUTH TWICE DAILY  12/15/22  Yes Marykay Lex, MD  Dulaglutide (TRULICITY) 0.75 MG/0.5ML SOPN Inject 0.75 mg into the skin once a week. Sundays   Yes [provider]  escitalopram (LEXAPRO) 10 MG tablet Take 10 mg by mouth at bedtime.    Yes [provider]  fluticasone (FLONASE) 50 MCG/ACT nasal spray Place 1 spray into both nostrils daily.   Yes [provider]  insulin lispro (HUMALOG) 100 UNIT/ML KwikPen Inject 8 Units into the skin 3 (three) times daily.   Yes [provider]  irbesartan (AVAPRO) 300 MG tablet Take 1 tablet (300 mg total) by mouth daily. Take at bedtime 10/13/22 10/13/23 Yes Monge, Petra Kuba, NP  isosorbide mononitrate (IMDUR) 60 MG 24 hr tablet Take 1 tablet (60 mg total) by mouth daily. 01/05/23  Yes Monge, Petra Kuba, NP  lansoprazole (PREVACID) 15 MG capsule Take 15 mg by mouth at bedtime.    Yes [provider]  latanoprost (XALATAN) 0.005 %  ophthalmic solution Place 1 drop into both eyes at bedtime. 07/30/22  Yes [provider]  metFORMIN (GLUCOPHAGE-XR) 500 MG 24 hr tablet Take 250 mg by mouth daily with breakfast.   Yes [provider]  Multiple Vitamin (MULTIVITAMIN WITH MINERALS) TABS tablet Take 1 tablet by mouth 2 (two) times daily.   Yes [provider]  nitroGLYCERIN (NITROSTAT) 0.4 MG SL tablet DISSOLVE 1 TABLET UNDER TONGUE EVERY 5 MINUTES AS NEEDED FOR CHESTPAIN 10/03/22  Yes Marykay Lex, MD  ondansetron (ZOFRAN) 4 MG tablet Take 1 tablet (4 mg total) by mouth every 6 (six) hours as needed for nausea. 07/18/22  Yes Drema Dallas, MD  ranolazine (RANEXA) 1000 MG SR tablet Take 1 tablet (1,000 mg total) by mouth 2 (two) times daily. 07/31/22  Yes Jonita Albee, PA-C  traZODone (DESYREL) 50 MG tablet Take 0.5 tablets (25 mg total) by mouth at bedtime as needed for sleep. 07/18/22  Yes Drema Dallas, MD  Continuous Blood Gluc Sensor (FREESTYLE LIBRE 14 DAY SENSOR) MISC Apply topically every 14 (fourteen) days.  04/03/21   [provider]  glucose blood (ONETOUCH VERIO) test strip Use as instructed to check blood sugar once a day dx code E11.65 02/04/14   Reather Littler, MD   Current Facility-Administered Medications  Medication Dose Route Frequency Provider Last Rate Last Admin   acetaminophen (TYLENOL) tablet 650 mg  650 mg Oral Q6H PRN Osvaldo Shipper, MD       Or   acetaminophen (TYLENOL) suppository 650 mg  650 mg Rectal Q6H PRN Osvaldo Shipper, MD       amLODipine (NORVASC) tablet 10 mg  10 mg Oral Daily Osvaldo Shipper, MD   10 mg at 01/26/23 0951   carvedilol (COREG) tablet 6.25 mg  6.25 mg Oral BID WC Osvaldo Shipper, MD   6.25 mg at 01/26/23 0756   enoxaparin (LOVENOX) injection 40 mg  40 mg Subcutaneous Q24H Osvaldo Shipper, MD       escitalopram (LEXAPRO) tablet 10 mg  10 mg Oral QHS Osvaldo Shipper, MD   10 mg at 01/25/23 2126   hydrALAZINE (APRESOLINE) injection 10 mg  10 mg Intravenous Q6H PRN Osvaldo Shipper, MD       insulin aspart (novoLOG) injection 0-9 Units  0-9 Units Subcutaneous TID WC Osvaldo Shipper, MD   2 Units at 01/26/23 1116   isosorbide mononitrate (IMDUR) 24 hr tablet 60 mg  60 mg Oral Daily Osvaldo Shipper, MD   60 mg at 01/26/23 0952   latanoprost (XALATAN) 0.005 % ophthalmic solution 1 drop  1 drop Both Eyes QHS Osvaldo Shipper, MD       ondansetron [facility]) tablet 4 mg  4 mg Oral Q6H PRN Osvaldo Shipper, MD       Or   ondansetron [facility]) injection 4 mg  4 mg Intravenous Q6H PRN Osvaldo Shipper, MD       oxyCODONE (Oxy IR/ROXICODONE) immediate release tablet 5 mg  5 mg Oral Q4H PRN Osvaldo Shipper, MD   5 mg at 01/26/23 1208   pantoprazole (PROTONIX) injection 40 mg  40 mg Intravenous Q12H Osvaldo Shipper, MD   40 mg at 01/26/23 1914   ranolazine (RANEXA) 12 hr tablet 1,000 mg  1,000 mg Oral BID Osvaldo Shipper, MD   1,000 mg at 01/26/23 7829   traZODone (DESYREL) tablet 25 mg  25 mg Oral QHS PRN Osvaldo Shipper, MD       Allergies as of 01/25/2023 - Review  Complete  01/25/2023  Allergen Reaction Noted   Codeine Shortness Of Breath 05/24/2022   Lactose intolerance (gi) Diarrhea and Other (See Comments) 12/29/2013   Oatmeal Other (See Comments) 12/29/2013   Family History  Problem Relation Age of Onset   Alcohol abuse Father    Heart attack Sister 19   Social History   Socioeconomic History   Marital status: Single    Spouse name: Not on file   Number of children: Not on file   Years of education: Not on file   Highest education level: Bachelor's degree (e.g., BA, AB, BS)  Occupational History   Occupation: -Futures trader: [employer]    Comment: [employer]  Tobacco Use   Smoking status: Former    Current packs/day: 0.00    Average packs/day: 0.3 packs/day for 1 year (0.3 ttl pk-yrs)    Types: Cigarettes    Start date: 02/08/1971    Quit date: 02/08/1972    Years since quitting: 51.0   Smokeless tobacco: Never  Substance and Sexual Activity   Alcohol use: No    Alcohol/week: 0.0 standard drinks of alcohol   Drug use: No   Sexual activity: Not Currently  Other Topics Concern   Not on file  Social History Narrative   She is single, 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  Previously worked as a Education officer, environmental.   She is single, lives alone.  Used to exercise by walking frequently, but with her knee arthritis, not walking as much.   Social Determinants of Health   Financial Resource Strain: Not on file  Food Insecurity: No Food Insecurity (01/25/2023)   Hunger Vital Sign    Worried About Running Out of Food in the Last Year: Never true    Ran Out of Food in the Last Year: Never true  Transportation Needs: No Transportation Needs (01/25/2023)   PRAPARE - Administrator, Civil Service (Medical): No    Lack of Transportation (Non-Medical): No  Physical Activity: Not on file  Stress: Not on file  Social Connections: Not on file  Intimate Partner Violence: Not At Risk  (01/25/2023)   Humiliation, Afraid, Rape, and Kick questionnaire    Fear of Current or Ex-Partner: No    Emotionally Abused: No    Physically Abused: No    Sexually Abused: No   Review of Systems:  Review of Systems  Respiratory:  Positive for shortness of breath.   Cardiovascular:  Positive for chest pain.  Gastrointestinal:  Positive for heartburn. Negative for abdominal pain, blood in stool, constipation, diarrhea, melena, nausea and vomiting.    OBJECTIVE:   Temp:  [98 F (36.7 C)-99.3 F (37.4 C)] 99.3 F (37.4 C) (10/25 1446) Pulse Rate:  [75-106] 84 (10/25 1446) Resp:  [17-25] 20 (10/25 1446) BP: (121-180)/(66-96) 121/66 (10/25 1446) SpO2:  [91 %-99 %] 95 % (10/25 1446)   Physical Exam Constitutional:      General: She is not in acute distress.    Appearance: She is not ill-appearing, toxic-appearing or diaphoretic.  Cardiovascular:     Rate and Rhythm: Normal rate and regular rhythm.  Pulmonary:     Effort: No respiratory distress.     Breath sounds: Normal breath sounds.  Abdominal:     General: Bowel sounds are normal. There is no distension.     Palpations: Abdomen is soft.     Tenderness: There is abdominal tenderness (Epigastric). There is no guarding.  Musculoskeletal:  Right lower leg: No edema.     Left lower leg: No edema.  Skin:    General: Skin is warm and dry.  Neurological:     Mental Status: She is alert.     Labs: Recent Labs    01/25/23 1046 01/26/23 0401  WBC 7.3 9.5  HGB 10.4* 10.9*  HCT 33.4* 34.9*  PLT 172 154   BMET Recent Labs    01/25/23 1046 01/26/23 0401  NA 140 133*  K 4.0 4.0  CL 107 100  CO2 24 25  GLUCOSE 130* 272*  BUN 21 15  CREATININE 1.34* 1.17*  CALCIUM 8.9 8.8*   LFT Recent Labs    01/25/23 1046 01/26/23 0401  PROT 6.4* 6.8  ALBUMIN 3.1* 3.2*  AST 27 21  ALT 30 29  ALKPHOS 58 56  BILITOT 0.9 0.9  BILIDIR 0.2  --   IBILI 0.7  --    PT/INR Recent Labs    01/26/23 0401  LABPROT 14.5   INR 1.1    Diagnostic imaging: VAS [place] LOWER EXTREMITY VENOUS (DVT)  Result Date: 01/26/2023  Lower Venous DVT Study Patient Name:  Karen Gardner  Date of Exam:   01/26/2023 Medical Rec #: [id number]         Accession #:    [id number] Date of Birth: 1955/07/19         Patient Gender: F Patient Age:   46 years Exam Location:  [facility] Procedure:      VAS [place] LOWER EXTREMITY VENOUS (DVT) Referring Phys: Osvaldo Shipper --------------------------------------------------------------------------------  Indications: Chest pain, Swelling, and Pain.  Comparison Study: No prior Performing Technologist: Fernande Bras  Examination Guidelines: A complete evaluation includes B-mode imaging, spectral Doppler, color Doppler, and power Doppler as needed of all accessible portions of each vessel. Bilateral testing is considered an integral part of a complete examination. Limited examinations for reoccurring indications may be performed as noted. The reflux portion of the exam is performed with the patient in reverse Trendelenburg.  +---------+---------------+---------+-----------+----------+--------------+ RIGHT    CompressibilityPhasicitySpontaneityPropertiesThrombus Aging +---------+---------------+---------+-----------+----------+--------------+ CFV      Full           Yes      Yes                                 +---------+---------------+---------+-----------+----------+--------------+ SFJ      Full                                                        +---------+---------------+---------+-----------+----------+--------------+ FV Prox  Full                                                        +---------+---------------+---------+-----------+----------+--------------+ FV Mid   Full                                                        +---------+---------------+---------+-----------+----------+--------------+ FV DistalFull                                                         +---------+---------------+---------+-----------+----------+--------------+  PFV      Full                                                        +---------+---------------+---------+-----------+----------+--------------+ POP      Full           Yes      Yes                                 +---------+---------------+---------+-----------+----------+--------------+ PTV      Full                                                        +---------+---------------+---------+-----------+----------+--------------+ PERO     Full                                                        +---------+---------------+---------+-----------+----------+--------------+   +---------+---------------+---------+-----------+----------+--------------+ LEFT     CompressibilityPhasicitySpontaneityPropertiesThrombus Aging +---------+---------------+---------+-----------+----------+--------------+ CFV      Full           Yes      Yes                                 +---------+---------------+---------+-----------+----------+--------------+ SFJ      Full                                                        +---------+---------------+---------+-----------+----------+--------------+ FV Prox  Full                                                        +---------+---------------+---------+-----------+----------+--------------+ FV Mid   Full                                                        +---------+---------------+---------+-----------+----------+--------------+ FV DistalFull                                                        +---------+---------------+---------+-----------+----------+--------------+ PFV      Full                                                        +---------+---------------+---------+-----------+----------+--------------+  POP      Full           Yes      Yes                                  +---------+---------------+---------+-----------+----------+--------------+ PTV      Full                                                        +---------+---------------+---------+-----------+----------+--------------+ PERO     Full                                                        +---------+---------------+---------+-----------+----------+--------------+    Summary: BILATERAL: - No evidence of deep vein thrombosis seen in the lower extremities, bilaterally. -No evidence of popliteal cyst, bilaterally.   *See table(s) above for measurements and observations.    Preliminary    CT ANGIO CHEST/ABD/PEL FOR DISSECTION W &/OR WO CONTRAST  Result Date: 01/25/2023 CLINICAL DATA:  Chest pain EXAM: CT ANGIOGRAPHY CHEST, ABDOMEN AND PELVIS TECHNIQUE: Non-contrast CT of the chest was initially obtained. Multidetector CT imaging through the chest, abdomen and pelvis was performed using the standard protocol during bolus administration of intravenous contrast. Multiplanar reconstructed images and MIPs were obtained and reviewed to evaluate the vascular anatomy. RADIATION DOSE REDUCTION: This exam was performed according to the departmental dose-optimization program which includes automated exposure control, adjustment of the mA and/or kV according to patient size and/or use of iterative reconstruction technique. CONTRAST:  OMNIPAQUE IOHEXOL 350 MG/ML SOLN COMPARISON:  07/16/2022 FINDINGS: CTA CHEST FINDINGS VASCULAR Aorta: Satisfactory opacification of the aorta. Normal contour and caliber of the thoracic aorta. No evidence of aneurysm, dissection, or other acute aortic pathology. Mild mixed calcific atherosclerosis. Cardiovascular: No evidence of pulmonary embolism on limited non-tailored examination. Normal heart size. Three-vessel coronary artery calcifications and or stents. No pericardial effusion. Review of the MIP images confirms the above findings. NON VASCULAR Mediastinum/Nodes: No  enlarged mediastinal, hilar, or axillary lymph nodes. Moderate hiatal hernia with intrathoracic position of the gastric fundus. Thyroid gland, trachea, and esophagus demonstrate no significant findings. Lungs/Pleura: Lungs are clear. No pleural effusion or pneumothorax. Musculoskeletal: No chest wall abnormality. No acute osseous findings. Review of the MIP images confirms the above findings. CTA ABDOMEN AND PELVIS FINDINGS VASCULAR Normal contour and caliber of the abdominal aorta. No evidence of aneurysm, dissection, or other acute aortic pathology. Replaced right hepatic artery arising from the superior mesenteric axis, with otherwise standard branching pattern of the abdominal aorta. Mild mixed calcific atherosclerosis. Review of the MIP images confirms the above findings. NON-VASCULAR Hepatobiliary: No solid liver abnormality is seen. No gallstones, gallbladder wall thickening, or biliary dilatation. Pancreas: Unremarkable. No pancreatic ductal dilatation or surrounding inflammatory changes. Spleen: Normal in size without significant abnormality. Adrenals/Urinary Tract: Adrenal glands are unremarkable. Kidneys are normal, without renal calculi, solid lesion, or hydronephrosis. Bladder is unremarkable. Stomach/Bowel: Status post partial sleeve gastrectomy. Appendix appears normal. No evidence of bowel wall thickening, distention, or inflammatory changes. Lymphatic: No enlarged abdominal or pelvic lymph nodes. Reproductive: Calcified uterine fibroids. Other:  No abdominal wall hernia or abnormality. No ascites. Musculoskeletal: No acute osseous findings. IMPRESSION: 1. Normal contour and caliber of the thoracic and abdominal aorta. No evidence of aneurysm, dissection, or other acute aortic pathology. Mild mixed calcific atherosclerosis. 2. Coronary artery disease. 3. Moderate hiatal hernia with intrathoracic position of the gastric fundus. 4. Status post partial sleeve gastrectomy. 5. Calcified uterine fibroids.  Electronically Signed   By: Jearld Lesch M.D.   On: 01/25/2023 13:53   DG Chest 2 View  Result Date: 01/25/2023 CLINICAL DATA:  Chest pain EXAM: CHEST - 2 VIEW COMPARISON:  07/16/2022 FINDINGS: Cardiac and mediastinal contours are within normal limits. No focal pulmonary opacity. No pleural effusion or pneumothorax. No acute osseous abnormality. IMPRESSION: No acute cardiopulmonary process. Electronically Signed   By: Wiliam Ke M.D.   On: 01/25/2023 11:45    IMPRESSION: Atypical chest pain, sudden onset GERD on OTC omeprazole prior to admission  History sleeve gastrectomy 2017 Coronary artery disease not amenable to PCI in past, with chronic angina  -Recommended to have aggressive medical management by Cardiology in the past  History dysphagia x 6 months to solid foods History type 2 diabetes mellitus Hypertension Chronic kidney disease  PLAN: -Request cardiac evaluation given patient significant coronary artery disease history -IV PPI Q12Hr -Start viscous lidocaine to determine if this would improve symptoms -Discussed EGD with patient, discussed benefits, alternatives and risks of bleeding/infection/perforation/anesthesia, she verbalized understanding and elected to proceed -Pending Cardiology evaluation prior to EGD -Ok for diet tonight, NPO at midnight for potential EGD 01/27/23 -[facility] will follow   LOS: 0 days   Liliane Shi, [facility] Gastroenterology

## 2023-01-26 NOTE — ED Notes (Signed)
Pt transitioned from ED stretcher to hospital bed for comfort. Call bell in reach. Side rails up x3.

## 2023-01-26 NOTE — ED Notes (Addendum)
Pt assisted to bedside commode and back to bed. Pt unsteady with standing and required RN & NT assistance to get back in bed. Pt requested pain medication and PRN provided. Side rails up x2, call bell in reach.

## 2023-01-26 NOTE — H&P (View-Only) (Signed)
[facility] Gastroenterology Consult  Referring Provider: No ref. provider found Primary Care Physician:  [name], MD Primary Gastroenterologist: Deboraha Sprang PCP  Reason for Consultation: Atypical chest pain   SUBJECTIVE:   HPI: Karen Gardner is a 67 y.o. female with past medical history significant for coronary artery disease status post left heart catheterization on 02/02/2021, hypertension, iron deficiency anemia, type 2 diabetes mellitus, asthma. Surgical history significant for gastric sleeve on 03/14/2016.   Presented to hospital on 01/25/23 with chief complaint of sudden onset of chest pain. She noted that she awoke from sleep and the pain began shortly thereafter. She had similar pain in the past which was ultimately related to angina, though this episode is more severe in intensity. She attempted nitroglycerin and aspirin (325 mg), both of which did not help her discomfort. She noted a history of GERD, she takes OTC Prilosec, she denied current pain feeling like acid reflux. Pain described as sharp in nature with pressure like sensation. She has pain with inspiration. She noted having roughly 6 month history of dysphagia to solids. She has not recently taken any large pills. Denied use of NSAIDs and powdered aspirin products.   Labs on presentation showed WBC 9.5, Hgb 10.9, PLT 154, Na 133, K 4.0, BUN/Cr 15/1.17, AST/ALT 21/29, ALP 56, total bilirubin 0.9.  CTA chest/abdomen and pelvis completed which showed no cholelithiasis, unremarkable pancreas, s/p partial sleeve gastrectomy, no bowel wall thickening, moderate sized hiatal hernia.   No prior EGD per patient.  Colonoscopy 02/02/2016 (Dr. Madilyn Fireman) normal, recommended to have repeat in 10 years.  No known family history colon cancer.   Past Medical History:  Diagnosis Date   Allergic rhinitis    Anxiety    Arthritis    Asthma    childhood   Chronic low back pain    CKD (chronic kidney disease), stage III (HCC)    However,  most recent creatinine 1.5.   Coronary artery disease involving native heart with other form of angina pectoris, unspecified vessel or lesion type [facility]) 11/16/2020   11/16/2020: CORONARY CA++ SCORE: Agatston Score 2245.  LAD 1093, LCx 959, RCA 193 (99th percentile) -> COR CTA (Agatston 2437) -moderate to severe multivessel CAD with significant blooming in all 3 epicardial vessels.  CAD RADS 3-4. FFRCT suggests mid RCA occlusion, => CARDIAC CATH 02/02/21: RCA: prox 70%, Mid-distal 90% & distal 90% (small caliber, Not viable PCI target; Prox RI 50%.   Depression    With anxiety   GERD (gastroesophageal reflux disease)    Headache    sinus headaches    Hypercholesterolemia    Hypertension    Iron deficiency anemia 05/26/2015   Has required transfusions-followed by Dr. Myna Hidalgo   Iron malabsorption 05/26/2015   Menopause    Numbness in both hands    mostly at night   Obesity    Osteoarthritis of both knees    Status post right TKA (and ankle) with plans for left TKA   Type 2 diabetes mellitus without complication, with long-term current use of insulin (HCC)    Type 2 on insulin pump   Vasovagal syncope 2018   Negative work-up   Vitamin D deficiency    Past Surgical History:  Procedure Laterality Date   COLONOSCOPY     EYE SURGERY Bilateral    [facility]    HERNIA REPAIR     umb hernia as child   KNEE ARTHROSCOPY  02/12/2012   Procedure: ARTHROSCOPY KNEE;  Surgeon: Nestor Lewandowsky, MD;  Location:  [facility];  Service: Orthopedics;  Laterality: Right;  Partial Lateral Meniscectomy, Debridement chondromalacia   LAPAROSCOPIC GASTRIC SLEEVE RESECTION N/A 03/14/2016   Procedure: LAPAROSCOPIC GASTRIC SLEEVE RESECTION, WITH REPAIR OF HIATAL HERNIA REPAI, AND UPPER ENDO;  Surgeon: Luretha Murphy, MD;  Location: [facility];  Service: General;  Laterality: N/A;   LEFT HEART CATH AND CORONARY ANGIOGRAPHY N/A 02/02/2021   Procedure: LEFT HEART CATH AND CORONARY ANGIOGRAPHY;  Surgeon: Marykay Lex, MD;  Location: [facility];; Heavily calcified tortuous codominant RCA with proximal 70% stenosis.  Mid to distal RCA 90% stenosis.  Distal RCA 90%.  Not a PCI target.  50% RI, proximal to mid LAD 20%.  Mid to distal LCx 30% with 30% in LP AV-heavily calcified.  EF 55-65%.  Mildly elevated LVEDP.   ORIF ANKLE FRACTURE Right 02/11/2014   Procedure: OPEN REDUCTION INTERNAL FIXATION (ORIF) RIGHT ANKLE FRACTURE;  Surgeon: Nestor Lewandowsky, MD;  Location: [facility] OR;  Service: Orthopedics;  Laterality: Right;   TOTAL KNEE ARTHROPLASTY Right 08/05/2014   Procedure: TOTAL KNEE ARTHROPLASTY;  Surgeon: Gean Birchwood, MD;  Location: [facility] OR;  Service: Orthopedics;  Laterality: Right;   TRANSTHORACIC ECHOCARDIOGRAM  05/2016   EF 60 to 65%.  Severe basal septal LVH with mild concentric LVH.  No R WMA.  GR 1 DD.  Indeterminate filling pressures. Minimal valve disease.   TRANSTHORACIC ECHOCARDIOGRAM  03/29/2021   EF 55 to 60%.  No R WMA-normal strain pattern.  GR 1 DD.  Normal RV with RV P mild aortic sclerosis but no stenosis no AI.  Mildly elevated RAP.   TRIGGER FINGER RELEASE Right 10/14/2018   Procedure: RELEASE TRIGGER FINGER/A-1 PULLEY;  Surgeon: Betha Loa, MD;  Location: [facility];  Service: Orthopedics;  Laterality: Right;   UPPER GI ENDOSCOPY     Prior to Admission medications   Medication Sig Start Date End Date Taking? Authorizing Provider  amLODipine (NORVASC) 10 MG tablet Take 1 tablet (10 mg total) by mouth daily. 07/19/22  Yes Drema Dallas, MD  aspirin EC 81 MG tablet Take 1 tablet (81 mg total) by mouth daily. 30 minutes prior to taking Isosorbide Patient taking differently: Take 325 mg by mouth daily. 30 minutes prior to taking Isosorbide 03/03/21  Yes Marykay Lex, MD  atorvastatin (LIPITOR) 80 MG tablet Take 1 tablet (80 mg total) by mouth at bedtime. 07/31/22  Yes Robet Leu R, PA-C  carvedilol (COREG) 6.25 MG tablet TAKE 1 TABLET BY MOUTH TWICE DAILY  12/15/22  Yes Marykay Lex, MD  Dulaglutide (TRULICITY) 0.75 MG/0.5ML SOPN Inject 0.75 mg into the skin once a week. Sundays   Yes [provider]  escitalopram (LEXAPRO) 10 MG tablet Take 10 mg by mouth at bedtime.    Yes [provider]  fluticasone (FLONASE) 50 MCG/ACT nasal spray Place 1 spray into both nostrils daily.   Yes [provider]  insulin lispro (HUMALOG) 100 UNIT/ML KwikPen Inject 8 Units into the skin 3 (three) times daily.   Yes [provider]  irbesartan (AVAPRO) 300 MG tablet Take 1 tablet (300 mg total) by mouth daily. Take at bedtime 10/13/22 10/13/23 Yes Monge, Petra Kuba, NP  isosorbide mononitrate (IMDUR) 60 MG 24 hr tablet Take 1 tablet (60 mg total) by mouth daily. 01/05/23  Yes Monge, Petra Kuba, NP  lansoprazole (PREVACID) 15 MG capsule Take 15 mg by mouth at bedtime.    Yes [provider]  latanoprost (XALATAN) 0.005 %  ophthalmic solution Place 1 drop into both eyes at bedtime. 07/30/22  Yes [provider]  metFORMIN (GLUCOPHAGE-XR) 500 MG 24 hr tablet Take 250 mg by mouth daily with breakfast.   Yes [provider]  Multiple Vitamin (MULTIVITAMIN WITH MINERALS) TABS tablet Take 1 tablet by mouth 2 (two) times daily.   Yes [provider]  nitroGLYCERIN (NITROSTAT) 0.4 MG SL tablet DISSOLVE 1 TABLET UNDER TONGUE EVERY 5 MINUTES AS NEEDED FOR CHESTPAIN 10/03/22  Yes Marykay Lex, MD  ondansetron (ZOFRAN) 4 MG tablet Take 1 tablet (4 mg total) by mouth every 6 (six) hours as needed for nausea. 07/18/22  Yes Drema Dallas, MD  ranolazine (RANEXA) 1000 MG SR tablet Take 1 tablet (1,000 mg total) by mouth 2 (two) times daily. 07/31/22  Yes Jonita Albee, PA-C  traZODone (DESYREL) 50 MG tablet Take 0.5 tablets (25 mg total) by mouth at bedtime as needed for sleep. 07/18/22  Yes Drema Dallas, MD  Continuous Blood Gluc Sensor (FREESTYLE LIBRE 14 DAY SENSOR) MISC Apply topically every 14 (fourteen) days.  04/03/21   [provider]  glucose blood (ONETOUCH VERIO) test strip Use as instructed to check blood sugar once a day dx code E11.65 02/04/14   Reather Littler, MD   Current Facility-Administered Medications  Medication Dose Route Frequency Provider Last Rate Last Admin   acetaminophen (TYLENOL) tablet 650 mg  650 mg Oral Q6H PRN Osvaldo Shipper, MD       Or   acetaminophen (TYLENOL) suppository 650 mg  650 mg Rectal Q6H PRN Osvaldo Shipper, MD       amLODipine (NORVASC) tablet 10 mg  10 mg Oral Daily Osvaldo Shipper, MD   10 mg at 01/26/23 0951   carvedilol (COREG) tablet 6.25 mg  6.25 mg Oral BID WC Osvaldo Shipper, MD   6.25 mg at 01/26/23 0756   enoxaparin (LOVENOX) injection 40 mg  40 mg Subcutaneous Q24H Osvaldo Shipper, MD       escitalopram (LEXAPRO) tablet 10 mg  10 mg Oral QHS Osvaldo Shipper, MD   10 mg at 01/25/23 2126   hydrALAZINE (APRESOLINE) injection 10 mg  10 mg Intravenous Q6H PRN Osvaldo Shipper, MD       insulin aspart (novoLOG) injection 0-9 Units  0-9 Units Subcutaneous TID WC Osvaldo Shipper, MD   2 Units at 01/26/23 1116   isosorbide mononitrate (IMDUR) 24 hr tablet 60 mg  60 mg Oral Daily Osvaldo Shipper, MD   60 mg at 01/26/23 0952   latanoprost (XALATAN) 0.005 % ophthalmic solution 1 drop  1 drop Both Eyes QHS Osvaldo Shipper, MD       ondansetron [facility]) tablet 4 mg  4 mg Oral Q6H PRN Osvaldo Shipper, MD       Or   ondansetron [facility]) injection 4 mg  4 mg Intravenous Q6H PRN Osvaldo Shipper, MD       oxyCODONE (Oxy IR/ROXICODONE) immediate release tablet 5 mg  5 mg Oral Q4H PRN Osvaldo Shipper, MD   5 mg at 01/26/23 1208   pantoprazole (PROTONIX) injection 40 mg  40 mg Intravenous Q12H Osvaldo Shipper, MD   40 mg at 01/26/23 1914   ranolazine (RANEXA) 12 hr tablet 1,000 mg  1,000 mg Oral BID Osvaldo Shipper, MD   1,000 mg at 01/26/23 7829   traZODone (DESYREL) tablet 25 mg  25 mg Oral QHS PRN Osvaldo Shipper, MD       Allergies as of 01/25/2023 - Review  Complete  01/25/2023  Allergen Reaction Noted   Codeine Shortness Of Breath 05/24/2022   Lactose intolerance (gi) Diarrhea and Other (See Comments) 12/29/2013   Oatmeal Other (See Comments) 12/29/2013   Family History  Problem Relation Age of Onset   Alcohol abuse Father    Heart attack Sister 19   Social History   Socioeconomic History   Marital status: Single    Spouse name: Not on file   Number of children: Not on file   Years of education: Not on file   Highest education level: Bachelor's degree (e.g., BA, AB, BS)  Occupational History   Occupation: -Futures trader: [employer]    Comment: [employer]  Tobacco Use   Smoking status: Former    Current packs/day: 0.00    Average packs/day: 0.3 packs/day for 1 year (0.3 ttl pk-yrs)    Types: Cigarettes    Start date: 02/08/1971    Quit date: 02/08/1972    Years since quitting: 51.0   Smokeless tobacco: Never  Substance and Sexual Activity   Alcohol use: No    Alcohol/week: 0.0 standard drinks of alcohol   Drug use: No   Sexual activity: Not Currently  Other Topics Concern   Not on file  Social History Narrative   She is single, 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  Previously worked as a Education officer, environmental.   She is single, lives alone.  Used to exercise by walking frequently, but with her knee arthritis, not walking as much.   Social Determinants of Health   Financial Resource Strain: Not on file  Food Insecurity: No Food Insecurity (01/25/2023)   Hunger Vital Sign    Worried About Running Out of Food in the Last Year: Never true    Ran Out of Food in the Last Year: Never true  Transportation Needs: No Transportation Needs (01/25/2023)   PRAPARE - Administrator, Civil Service (Medical): No    Lack of Transportation (Non-Medical): No  Physical Activity: Not on file  Stress: Not on file  Social Connections: Not on file  Intimate Partner Violence: Not At Risk  (01/25/2023)   Humiliation, Afraid, Rape, and Kick questionnaire    Fear of Current or Ex-Partner: No    Emotionally Abused: No    Physically Abused: No    Sexually Abused: No   Review of Systems:  Review of Systems  Respiratory:  Positive for shortness of breath.   Cardiovascular:  Positive for chest pain.  Gastrointestinal:  Positive for heartburn. Negative for abdominal pain, blood in stool, constipation, diarrhea, melena, nausea and vomiting.    OBJECTIVE:   Temp:  [98 F (36.7 C)-99.3 F (37.4 C)] 99.3 F (37.4 C) (10/25 1446) Pulse Rate:  [75-106] 84 (10/25 1446) Resp:  [17-25] 20 (10/25 1446) BP: (121-180)/(66-96) 121/66 (10/25 1446) SpO2:  [91 %-99 %] 95 % (10/25 1446)   Physical Exam Constitutional:      General: She is not in acute distress.    Appearance: She is not ill-appearing, toxic-appearing or diaphoretic.  Cardiovascular:     Rate and Rhythm: Normal rate and regular rhythm.  Pulmonary:     Effort: No respiratory distress.     Breath sounds: Normal breath sounds.  Abdominal:     General: Bowel sounds are normal. There is no distension.     Palpations: Abdomen is soft.     Tenderness: There is abdominal tenderness (Epigastric). There is no guarding.  Musculoskeletal:  Right lower leg: No edema.     Left lower leg: No edema.  Skin:    General: Skin is warm and dry.  Neurological:     Mental Status: She is alert.     Labs: Recent Labs    01/25/23 1046 01/26/23 0401  WBC 7.3 9.5  HGB 10.4* 10.9*  HCT 33.4* 34.9*  PLT 172 154   BMET Recent Labs    01/25/23 1046 01/26/23 0401  NA 140 133*  K 4.0 4.0  CL 107 100  CO2 24 25  GLUCOSE 130* 272*  BUN 21 15  CREATININE 1.34* 1.17*  CALCIUM 8.9 8.8*   LFT Recent Labs    01/25/23 1046 01/26/23 0401  PROT 6.4* 6.8  ALBUMIN 3.1* 3.2*  AST 27 21  ALT 30 29  ALKPHOS 58 56  BILITOT 0.9 0.9  BILIDIR 0.2  --   IBILI 0.7  --    PT/INR Recent Labs    01/26/23 0401  LABPROT 14.5   INR 1.1    Diagnostic imaging: VAS [place] LOWER EXTREMITY VENOUS (DVT)  Result Date: 01/26/2023  Lower Venous DVT Study Patient Name:  Karen Gardner  Date of Exam:   01/26/2023 Medical Rec #: [id number]         Accession #:    [id number] Date of Birth: 1955/07/19         Patient Gender: F Patient Age:   46 years Exam Location:  [facility] Procedure:      VAS [place] LOWER EXTREMITY VENOUS (DVT) Referring Phys: Osvaldo Shipper --------------------------------------------------------------------------------  Indications: Chest pain, Swelling, and Pain.  Comparison Study: No prior Performing Technologist: Fernande Bras  Examination Guidelines: A complete evaluation includes B-mode imaging, spectral Doppler, color Doppler, and power Doppler as needed of all accessible portions of each vessel. Bilateral testing is considered an integral part of a complete examination. Limited examinations for reoccurring indications may be performed as noted. The reflux portion of the exam is performed with the patient in reverse Trendelenburg.  +---------+---------------+---------+-----------+----------+--------------+ RIGHT    CompressibilityPhasicitySpontaneityPropertiesThrombus Aging +---------+---------------+---------+-----------+----------+--------------+ CFV      Full           Yes      Yes                                 +---------+---------------+---------+-----------+----------+--------------+ SFJ      Full                                                        +---------+---------------+---------+-----------+----------+--------------+ FV Prox  Full                                                        +---------+---------------+---------+-----------+----------+--------------+ FV Mid   Full                                                        +---------+---------------+---------+-----------+----------+--------------+ FV DistalFull                                                         +---------+---------------+---------+-----------+----------+--------------+  PFV      Full                                                        +---------+---------------+---------+-----------+----------+--------------+ POP      Full           Yes      Yes                                 +---------+---------------+---------+-----------+----------+--------------+ PTV      Full                                                        +---------+---------------+---------+-----------+----------+--------------+ PERO     Full                                                        +---------+---------------+---------+-----------+----------+--------------+   +---------+---------------+---------+-----------+----------+--------------+ LEFT     CompressibilityPhasicitySpontaneityPropertiesThrombus Aging +---------+---------------+---------+-----------+----------+--------------+ CFV      Full           Yes      Yes                                 +---------+---------------+---------+-----------+----------+--------------+ SFJ      Full                                                        +---------+---------------+---------+-----------+----------+--------------+ FV Prox  Full                                                        +---------+---------------+---------+-----------+----------+--------------+ FV Mid   Full                                                        +---------+---------------+---------+-----------+----------+--------------+ FV DistalFull                                                        +---------+---------------+---------+-----------+----------+--------------+ PFV      Full                                                        +---------+---------------+---------+-----------+----------+--------------+  POP      Full           Yes      Yes                                  +---------+---------------+---------+-----------+----------+--------------+ PTV      Full                                                        +---------+---------------+---------+-----------+----------+--------------+ PERO     Full                                                        +---------+---------------+---------+-----------+----------+--------------+    Summary: BILATERAL: - No evidence of deep vein thrombosis seen in the lower extremities, bilaterally. -No evidence of popliteal cyst, bilaterally.   *See table(s) above for measurements and observations.    Preliminary    CT ANGIO CHEST/ABD/PEL FOR DISSECTION W &/OR WO CONTRAST  Result Date: 01/25/2023 CLINICAL DATA:  Chest pain EXAM: CT ANGIOGRAPHY CHEST, ABDOMEN AND PELVIS TECHNIQUE: Non-contrast CT of the chest was initially obtained. Multidetector CT imaging through the chest, abdomen and pelvis was performed using the standard protocol during bolus administration of intravenous contrast. Multiplanar reconstructed images and MIPs were obtained and reviewed to evaluate the vascular anatomy. RADIATION DOSE REDUCTION: This exam was performed according to the departmental dose-optimization program which includes automated exposure control, adjustment of the mA and/or kV according to patient size and/or use of iterative reconstruction technique. CONTRAST:  OMNIPAQUE IOHEXOL 350 MG/ML SOLN COMPARISON:  07/16/2022 FINDINGS: CTA CHEST FINDINGS VASCULAR Aorta: Satisfactory opacification of the aorta. Normal contour and caliber of the thoracic aorta. No evidence of aneurysm, dissection, or other acute aortic pathology. Mild mixed calcific atherosclerosis. Cardiovascular: No evidence of pulmonary embolism on limited non-tailored examination. Normal heart size. Three-vessel coronary artery calcifications and or stents. No pericardial effusion. Review of the MIP images confirms the above findings. NON VASCULAR Mediastinum/Nodes: No  enlarged mediastinal, hilar, or axillary lymph nodes. Moderate hiatal hernia with intrathoracic position of the gastric fundus. Thyroid gland, trachea, and esophagus demonstrate no significant findings. Lungs/Pleura: Lungs are clear. No pleural effusion or pneumothorax. Musculoskeletal: No chest wall abnormality. No acute osseous findings. Review of the MIP images confirms the above findings. CTA ABDOMEN AND PELVIS FINDINGS VASCULAR Normal contour and caliber of the abdominal aorta. No evidence of aneurysm, dissection, or other acute aortic pathology. Replaced right hepatic artery arising from the superior mesenteric axis, with otherwise standard branching pattern of the abdominal aorta. Mild mixed calcific atherosclerosis. Review of the MIP images confirms the above findings. NON-VASCULAR Hepatobiliary: No solid liver abnormality is seen. No gallstones, gallbladder wall thickening, or biliary dilatation. Pancreas: Unremarkable. No pancreatic ductal dilatation or surrounding inflammatory changes. Spleen: Normal in size without significant abnormality. Adrenals/Urinary Tract: Adrenal glands are unremarkable. Kidneys are normal, without renal calculi, solid lesion, or hydronephrosis. Bladder is unremarkable. Stomach/Bowel: Status post partial sleeve gastrectomy. Appendix appears normal. No evidence of bowel wall thickening, distention, or inflammatory changes. Lymphatic: No enlarged abdominal or pelvic lymph nodes. Reproductive: Calcified uterine fibroids. Other:  No abdominal wall hernia or abnormality. No ascites. Musculoskeletal: No acute osseous findings. IMPRESSION: 1. Normal contour and caliber of the thoracic and abdominal aorta. No evidence of aneurysm, dissection, or other acute aortic pathology. Mild mixed calcific atherosclerosis. 2. Coronary artery disease. 3. Moderate hiatal hernia with intrathoracic position of the gastric fundus. 4. Status post partial sleeve gastrectomy. 5. Calcified uterine fibroids.  Electronically Signed   By: Jearld Lesch M.D.   On: 01/25/2023 13:53   DG Chest 2 View  Result Date: 01/25/2023 CLINICAL DATA:  Chest pain EXAM: CHEST - 2 VIEW COMPARISON:  07/16/2022 FINDINGS: Cardiac and mediastinal contours are within normal limits. No focal pulmonary opacity. No pleural effusion or pneumothorax. No acute osseous abnormality. IMPRESSION: No acute cardiopulmonary process. Electronically Signed   By: Wiliam Ke M.D.   On: 01/25/2023 11:45    IMPRESSION: Atypical chest pain, sudden onset GERD on OTC omeprazole prior to admission  History sleeve gastrectomy 2017 Coronary artery disease not amenable to PCI in past, with chronic angina  -Recommended to have aggressive medical management by Cardiology in the past  History dysphagia x 6 months to solid foods History type 2 diabetes mellitus Hypertension Chronic kidney disease  PLAN: -Request cardiac evaluation given patient significant coronary artery disease history -IV PPI Q12Hr -Start viscous lidocaine to determine if this would improve symptoms -Discussed EGD with patient, discussed benefits, alternatives and risks of bleeding/infection/perforation/anesthesia, she verbalized understanding and elected to proceed -Pending Cardiology evaluation prior to EGD -Ok for diet tonight, NPO at midnight for potential EGD 01/27/23 -[facility] will follow   LOS: 0 days   Liliane Shi, [facility] Gastroenterology

## 2023-01-26 NOTE — Plan of Care (Signed)

## 2023-01-26 NOTE — Consult Note (Signed)
left ventricle has normal function. The left ventricle has no regional  wall motion abnormalities. There is mild concentric left ventricular  hypertrophy. Left ventricular diastolic  parameters are indeterminate.   2. Right ventricular systolic function is normal. The right ventricular  size is normal. There is normal pulmonary artery systolic pressure.   3. The mitral valve is normal in structure. Mild mitral valve  regurgitation. No evidence of mitral stenosis.   4. The aortic valve is normal in structure. Aortic valve regurgitation is  not visualized. Aortic valve sclerosis/calcification is present, without  any evidence of aortic stenosis.   5. The inferior vena cava is normal in size with greater than 50%  respiratory variability, suggesting right atrial pressure of 3 mmHg.   Laboratory Data: High Sensitivity Troponin:   Recent Labs  Lab 01/25/23 1046 01/25/23 1224   TROPONINIHS 4 4     Cardiac EnzymesNo results for input(s): "TROPONINI" in the last 168 hours. No results for input(s): "TROPIPOC" in the last 168 hours.  Chemistry Recent Labs  Lab 01/25/23 1046 01/26/23 0401  NA 140 133*  K 4.0 4.0  CL 107 100  CO2 24 25  GLUCOSE 130* 272*  BUN 21 15  CREATININE 1.34* 1.17*  CALCIUM 8.9 8.8*  GFRNONAA 43* 51*  ANIONGAP 9 8    Recent Labs  Lab 01/25/23 1046 01/26/23 0401  PROT 6.4* 6.8  ALBUMIN 3.1* 3.2*  AST 27 21  ALT 30 29  ALKPHOS 58 56  BILITOT 0.9 0.9   Hematology Recent Labs  Lab 01/25/23 1046 01/26/23 0401  WBC 7.3 9.5  RBC 3.57* 3.67*  HGB 10.4* 10.9*  HCT 33.4* 34.9*  MCV 93.6 95.1  MCH 29.1 29.7  MCHC 31.1 31.2  RDW 13.2 13.2  PLT 172 154   BNPNo results for input(s): "BNP", "PROBNP" in the last 168 hours.  DDimer No results for input(s): "DDIMER" in the last 168 hours.  Radiology/Studies:  VAS [place] LOWER EXTREMITY VENOUS (DVT)  Result Date: 01/26/2023  Lower Venous DVT Study Patient Name:  Karen Gardner  Date of Exam:   01/26/2023 Medical Rec #: [id number]         Accession #:    [id number] Date of Birth: 07-18-1955         Patient Gender: F Patient Age:   18 years Exam Location:  [facility] Procedure:      VAS [place] LOWER EXTREMITY VENOUS (DVT) Referring Phys: Karen Gardner --------------------------------------------------------------------------------  Indications: Chest pain, Swelling, and Pain.  Comparison Study: No prior Performing Technologist: Karen Gardner  Examination Guidelines: A complete evaluation includes B-mode imaging, spectral Doppler, color Doppler, and power Doppler as needed of all accessible portions of each vessel. Bilateral testing is considered an integral part of a complete examination. Limited examinations for reoccurring indications may be performed as noted. The reflux portion of the exam is performed with the patient in reverse Trendelenburg.   +---------+---------------+---------+-----------+----------+--------------+ RIGHT    CompressibilityPhasicitySpontaneityPropertiesThrombus Aging +---------+---------------+---------+-----------+----------+--------------+ CFV      Full           Yes      Yes                                 +---------+---------------+---------+-----------+----------+--------------+ SFJ      Full                                                        +---------+---------------+---------+-----------+----------+--------------+  Cardiology Consultation:  Patient ID: Karen Gardner MRN: [id number]; DOB: December 30, 1955  Admit date: 01/25/2023 Date of Consult: 01/26/2023  Primary Care Provider: [name], MD Primary Cardiologist: Karen Lemma, MD  Primary Electrophysiologist:  None   Patient Profile:  Karen Gardner is a 67 y.o. female with a hx of CAD, diabetes, CKD stage IIIa, hypertension who is being seen today for the evaluation of preoperative assessment/chest pain at the request of Karen Shipper, MD.  History of Present Illness:  Karen Gardner presents with acute chest discomfort that began on 01/25/2023.  She reports he awoke from sleep with sudden sharp chest discomfort.  She took nitroglycerin but did not help.  She reports the pain became so intense she came to the ER.  She does describe that it is worse with inspiration.  She is not tachycardic.  She is not hypoxic.  CT dissection study was negative.  She does endorse 6 months of dysphagia.  He reports trouble getting solids down.  CT scan does show a hiatal hernia.  Her EKG shows normal sinus rhythm with no acute ischemic changes.  Troponins are negative x 2.  Labs are notable for stable serum creatinine 1.17.  Hemoglobin 10.9.  She continues to experience chest discomfort.  She reports it may be worse with palpation of her chest.  She had an episode in the room during my interview.  She reports if she changes position she can make it go away.  She does have a history of CAD that is managed medically.  She has an RCA that is diffusely diseased with obstructive lesions that are managed medically.  She has a 50% ramus lesion and 20% LAD lesion.  Echocardiogram in April of this year was normal.  Heart Pathway Score:       Past Medical History: Past Medical History:  Diagnosis Date   Allergic rhinitis    Anxiety    Arthritis    Asthma    childhood   Chronic low back pain    CKD (chronic kidney disease), stage III (HCC)    However, most recent  creatinine 1.5.   Coronary artery disease involving native heart with other form of angina pectoris, unspecified vessel or lesion type [facility]) 11/16/2020   11/16/2020: CORONARY CA++ SCORE: Agatston Score 2245.  LAD 1093, LCx 959, RCA 193 (99th percentile) -> COR CTA (Agatston 2437) -moderate to severe multivessel CAD with significant blooming in all 3 epicardial vessels.  CAD RADS 3-4. FFRCT suggests mid RCA occlusion, => CARDIAC CATH 02/02/21: RCA: prox 70%, Mid-distal 90% & distal 90% (small caliber, Not viable PCI target; Prox RI 50%.   Depression    With anxiety   GERD (gastroesophageal reflux disease)    Headache    sinus headaches    Hypercholesterolemia    Hypertension    Iron deficiency anemia 05/26/2015   Has required transfusions-followed by Dr. Myna Hidalgo   Iron malabsorption 05/26/2015   Menopause    Numbness in both hands    mostly at night   Obesity    Osteoarthritis of both knees    Status post right TKA (and ankle) with plans for left TKA   Type 2 diabetes mellitus without complication, with long-term current use of insulin (HCC)    Type 2 on insulin pump   Vasovagal syncope 2018   Negative work-up   Vitamin D deficiency     Past Surgical History: Past Surgical History:  Procedure Laterality Date   COLONOSCOPY     EYE  left ventricle has normal function. The left ventricle has no regional  wall motion abnormalities. There is mild concentric left ventricular  hypertrophy. Left ventricular diastolic  parameters are indeterminate.   2. Right ventricular systolic function is normal. The right ventricular  size is normal. There is normal pulmonary artery systolic pressure.   3. The mitral valve is normal in structure. Mild mitral valve  regurgitation. No evidence of mitral stenosis.   4. The aortic valve is normal in structure. Aortic valve regurgitation is  not visualized. Aortic valve sclerosis/calcification is present, without  any evidence of aortic stenosis.   5. The inferior vena cava is normal in size with greater than 50%  respiratory variability, suggesting right atrial pressure of 3 mmHg.   Laboratory Data: High Sensitivity Troponin:   Recent Labs  Lab 01/25/23 1046 01/25/23 1224   TROPONINIHS 4 4     Cardiac EnzymesNo results for input(s): "TROPONINI" in the last 168 hours. No results for input(s): "TROPIPOC" in the last 168 hours.  Chemistry Recent Labs  Lab 01/25/23 1046 01/26/23 0401  NA 140 133*  K 4.0 4.0  CL 107 100  CO2 24 25  GLUCOSE 130* 272*  BUN 21 15  CREATININE 1.34* 1.17*  CALCIUM 8.9 8.8*  GFRNONAA 43* 51*  ANIONGAP 9 8    Recent Labs  Lab 01/25/23 1046 01/26/23 0401  PROT 6.4* 6.8  ALBUMIN 3.1* 3.2*  AST 27 21  ALT 30 29  ALKPHOS 58 56  BILITOT 0.9 0.9   Hematology Recent Labs  Lab 01/25/23 1046 01/26/23 0401  WBC 7.3 9.5  RBC 3.57* 3.67*  HGB 10.4* 10.9*  HCT 33.4* 34.9*  MCV 93.6 95.1  MCH 29.1 29.7  MCHC 31.1 31.2  RDW 13.2 13.2  PLT 172 154   BNPNo results for input(s): "BNP", "PROBNP" in the last 168 hours.  DDimer No results for input(s): "DDIMER" in the last 168 hours.  Radiology/Studies:  VAS [place] LOWER EXTREMITY VENOUS (DVT)  Result Date: 01/26/2023  Lower Venous DVT Study Patient Name:  Karen Gardner  Date of Exam:   01/26/2023 Medical Rec #: [id number]         Accession #:    [id number] Date of Birth: 07-18-1955         Patient Gender: F Patient Age:   18 years Exam Location:  [facility] Procedure:      VAS [place] LOWER EXTREMITY VENOUS (DVT) Referring Phys: Karen Gardner --------------------------------------------------------------------------------  Indications: Chest pain, Swelling, and Pain.  Comparison Study: No prior Performing Technologist: Karen Gardner  Examination Guidelines: A complete evaluation includes B-mode imaging, spectral Doppler, color Doppler, and power Doppler as needed of all accessible portions of each vessel. Bilateral testing is considered an integral part of a complete examination. Limited examinations for reoccurring indications may be performed as noted. The reflux portion of the exam is performed with the patient in reverse Trendelenburg.   +---------+---------------+---------+-----------+----------+--------------+ RIGHT    CompressibilityPhasicitySpontaneityPropertiesThrombus Aging +---------+---------------+---------+-----------+----------+--------------+ CFV      Full           Yes      Yes                                 +---------+---------------+---------+-----------+----------+--------------+ SFJ      Full                                                        +---------+---------------+---------+-----------+----------+--------------+  Cardiology Consultation:  Patient ID: Karen Gardner MRN: [id number]; DOB: December 30, 1955  Admit date: 01/25/2023 Date of Consult: 01/26/2023  Primary Care Provider: [name], MD Primary Cardiologist: Karen Lemma, MD  Primary Electrophysiologist:  None   Patient Profile:  Karen Gardner is a 67 y.o. female with a hx of CAD, diabetes, CKD stage IIIa, hypertension who is being seen today for the evaluation of preoperative assessment/chest pain at the request of Karen Shipper, MD.  History of Present Illness:  Karen Gardner presents with acute chest discomfort that began on 01/25/2023.  She reports he awoke from sleep with sudden sharp chest discomfort.  She took nitroglycerin but did not help.  She reports the pain became so intense she came to the ER.  She does describe that it is worse with inspiration.  She is not tachycardic.  She is not hypoxic.  CT dissection study was negative.  She does endorse 6 months of dysphagia.  He reports trouble getting solids down.  CT scan does show a hiatal hernia.  Her EKG shows normal sinus rhythm with no acute ischemic changes.  Troponins are negative x 2.  Labs are notable for stable serum creatinine 1.17.  Hemoglobin 10.9.  She continues to experience chest discomfort.  She reports it may be worse with palpation of her chest.  She had an episode in the room during my interview.  She reports if she changes position she can make it go away.  She does have a history of CAD that is managed medically.  She has an RCA that is diffusely diseased with obstructive lesions that are managed medically.  She has a 50% ramus lesion and 20% LAD lesion.  Echocardiogram in April of this year was normal.  Heart Pathway Score:       Past Medical History: Past Medical History:  Diagnosis Date   Allergic rhinitis    Anxiety    Arthritis    Asthma    childhood   Chronic low back pain    CKD (chronic kidney disease), stage III (HCC)    However, most recent  creatinine 1.5.   Coronary artery disease involving native heart with other form of angina pectoris, unspecified vessel or lesion type [facility]) 11/16/2020   11/16/2020: CORONARY CA++ SCORE: Agatston Score 2245.  LAD 1093, LCx 959, RCA 193 (99th percentile) -> COR CTA (Agatston 2437) -moderate to severe multivessel CAD with significant blooming in all 3 epicardial vessels.  CAD RADS 3-4. FFRCT suggests mid RCA occlusion, => CARDIAC CATH 02/02/21: RCA: prox 70%, Mid-distal 90% & distal 90% (small caliber, Not viable PCI target; Prox RI 50%.   Depression    With anxiety   GERD (gastroesophageal reflux disease)    Headache    sinus headaches    Hypercholesterolemia    Hypertension    Iron deficiency anemia 05/26/2015   Has required transfusions-followed by Dr. Myna Hidalgo   Iron malabsorption 05/26/2015   Menopause    Numbness in both hands    mostly at night   Obesity    Osteoarthritis of both knees    Status post right TKA (and ankle) with plans for left TKA   Type 2 diabetes mellitus without complication, with long-term current use of insulin (HCC)    Type 2 on insulin pump   Vasovagal syncope 2018   Negative work-up   Vitamin D deficiency     Past Surgical History: Past Surgical History:  Procedure Laterality Date   COLONOSCOPY     EYE  Cardiology Consultation:  Patient ID: Karen Gardner MRN: [id number]; DOB: December 30, 1955  Admit date: 01/25/2023 Date of Consult: 01/26/2023  Primary Care Provider: [name], MD Primary Cardiologist: Karen Lemma, MD  Primary Electrophysiologist:  None   Patient Profile:  Karen Gardner is a 67 y.o. female with a hx of CAD, diabetes, CKD stage IIIa, hypertension who is being seen today for the evaluation of preoperative assessment/chest pain at the request of Karen Shipper, MD.  History of Present Illness:  Karen Gardner presents with acute chest discomfort that began on 01/25/2023.  She reports he awoke from sleep with sudden sharp chest discomfort.  She took nitroglycerin but did not help.  She reports the pain became so intense she came to the ER.  She does describe that it is worse with inspiration.  She is not tachycardic.  She is not hypoxic.  CT dissection study was negative.  She does endorse 6 months of dysphagia.  He reports trouble getting solids down.  CT scan does show a hiatal hernia.  Her EKG shows normal sinus rhythm with no acute ischemic changes.  Troponins are negative x 2.  Labs are notable for stable serum creatinine 1.17.  Hemoglobin 10.9.  She continues to experience chest discomfort.  She reports it may be worse with palpation of her chest.  She had an episode in the room during my interview.  She reports if she changes position she can make it go away.  She does have a history of CAD that is managed medically.  She has an RCA that is diffusely diseased with obstructive lesions that are managed medically.  She has a 50% ramus lesion and 20% LAD lesion.  Echocardiogram in April of this year was normal.  Heart Pathway Score:       Past Medical History: Past Medical History:  Diagnosis Date   Allergic rhinitis    Anxiety    Arthritis    Asthma    childhood   Chronic low back pain    CKD (chronic kidney disease), stage III (HCC)    However, most recent  creatinine 1.5.   Coronary artery disease involving native heart with other form of angina pectoris, unspecified vessel or lesion type [facility]) 11/16/2020   11/16/2020: CORONARY CA++ SCORE: Agatston Score 2245.  LAD 1093, LCx 959, RCA 193 (99th percentile) -> COR CTA (Agatston 2437) -moderate to severe multivessel CAD with significant blooming in all 3 epicardial vessels.  CAD RADS 3-4. FFRCT suggests mid RCA occlusion, => CARDIAC CATH 02/02/21: RCA: prox 70%, Mid-distal 90% & distal 90% (small caliber, Not viable PCI target; Prox RI 50%.   Depression    With anxiety   GERD (gastroesophageal reflux disease)    Headache    sinus headaches    Hypercholesterolemia    Hypertension    Iron deficiency anemia 05/26/2015   Has required transfusions-followed by Dr. Myna Hidalgo   Iron malabsorption 05/26/2015   Menopause    Numbness in both hands    mostly at night   Obesity    Osteoarthritis of both knees    Status post right TKA (and ankle) with plans for left TKA   Type 2 diabetes mellitus without complication, with long-term current use of insulin (HCC)    Type 2 on insulin pump   Vasovagal syncope 2018   Negative work-up   Vitamin D deficiency     Past Surgical History: Past Surgical History:  Procedure Laterality Date   COLONOSCOPY     EYE  left ventricle has normal function. The left ventricle has no regional  wall motion abnormalities. There is mild concentric left ventricular  hypertrophy. Left ventricular diastolic  parameters are indeterminate.   2. Right ventricular systolic function is normal. The right ventricular  size is normal. There is normal pulmonary artery systolic pressure.   3. The mitral valve is normal in structure. Mild mitral valve  regurgitation. No evidence of mitral stenosis.   4. The aortic valve is normal in structure. Aortic valve regurgitation is  not visualized. Aortic valve sclerosis/calcification is present, without  any evidence of aortic stenosis.   5. The inferior vena cava is normal in size with greater than 50%  respiratory variability, suggesting right atrial pressure of 3 mmHg.   Laboratory Data: High Sensitivity Troponin:   Recent Labs  Lab 01/25/23 1046 01/25/23 1224   TROPONINIHS 4 4     Cardiac EnzymesNo results for input(s): "TROPONINI" in the last 168 hours. No results for input(s): "TROPIPOC" in the last 168 hours.  Chemistry Recent Labs  Lab 01/25/23 1046 01/26/23 0401  NA 140 133*  K 4.0 4.0  CL 107 100  CO2 24 25  GLUCOSE 130* 272*  BUN 21 15  CREATININE 1.34* 1.17*  CALCIUM 8.9 8.8*  GFRNONAA 43* 51*  ANIONGAP 9 8    Recent Labs  Lab 01/25/23 1046 01/26/23 0401  PROT 6.4* 6.8  ALBUMIN 3.1* 3.2*  AST 27 21  ALT 30 29  ALKPHOS 58 56  BILITOT 0.9 0.9   Hematology Recent Labs  Lab 01/25/23 1046 01/26/23 0401  WBC 7.3 9.5  RBC 3.57* 3.67*  HGB 10.4* 10.9*  HCT 33.4* 34.9*  MCV 93.6 95.1  MCH 29.1 29.7  MCHC 31.1 31.2  RDW 13.2 13.2  PLT 172 154   BNPNo results for input(s): "BNP", "PROBNP" in the last 168 hours.  DDimer No results for input(s): "DDIMER" in the last 168 hours.  Radiology/Studies:  VAS [place] LOWER EXTREMITY VENOUS (DVT)  Result Date: 01/26/2023  Lower Venous DVT Study Patient Name:  Karen Gardner  Date of Exam:   01/26/2023 Medical Rec #: [id number]         Accession #:    [id number] Date of Birth: 07-18-1955         Patient Gender: F Patient Age:   18 years Exam Location:  [facility] Procedure:      VAS [place] LOWER EXTREMITY VENOUS (DVT) Referring Phys: Karen Gardner --------------------------------------------------------------------------------  Indications: Chest pain, Swelling, and Pain.  Comparison Study: No prior Performing Technologist: Karen Gardner  Examination Guidelines: A complete evaluation includes B-mode imaging, spectral Doppler, color Doppler, and power Doppler as needed of all accessible portions of each vessel. Bilateral testing is considered an integral part of a complete examination. Limited examinations for reoccurring indications may be performed as noted. The reflux portion of the exam is performed with the patient in reverse Trendelenburg.   +---------+---------------+---------+-----------+----------+--------------+ RIGHT    CompressibilityPhasicitySpontaneityPropertiesThrombus Aging +---------+---------------+---------+-----------+----------+--------------+ CFV      Full           Yes      Yes                                 +---------+---------------+---------+-----------+----------+--------------+ SFJ      Full                                                        +---------+---------------+---------+-----------+----------+--------------+  Cardiology Consultation:  Patient ID: Karen Gardner MRN: [id number]; DOB: December 30, 1955  Admit date: 01/25/2023 Date of Consult: 01/26/2023  Primary Care Provider: [name], MD Primary Cardiologist: Karen Lemma, MD  Primary Electrophysiologist:  None   Patient Profile:  Karen Gardner is a 67 y.o. female with a hx of CAD, diabetes, CKD stage IIIa, hypertension who is being seen today for the evaluation of preoperative assessment/chest pain at the request of Karen Shipper, MD.  History of Present Illness:  Karen Gardner presents with acute chest discomfort that began on 01/25/2023.  She reports he awoke from sleep with sudden sharp chest discomfort.  She took nitroglycerin but did not help.  She reports the pain became so intense she came to the ER.  She does describe that it is worse with inspiration.  She is not tachycardic.  She is not hypoxic.  CT dissection study was negative.  She does endorse 6 months of dysphagia.  He reports trouble getting solids down.  CT scan does show a hiatal hernia.  Her EKG shows normal sinus rhythm with no acute ischemic changes.  Troponins are negative x 2.  Labs are notable for stable serum creatinine 1.17.  Hemoglobin 10.9.  She continues to experience chest discomfort.  She reports it may be worse with palpation of her chest.  She had an episode in the room during my interview.  She reports if she changes position she can make it go away.  She does have a history of CAD that is managed medically.  She has an RCA that is diffusely diseased with obstructive lesions that are managed medically.  She has a 50% ramus lesion and 20% LAD lesion.  Echocardiogram in April of this year was normal.  Heart Pathway Score:       Past Medical History: Past Medical History:  Diagnosis Date   Allergic rhinitis    Anxiety    Arthritis    Asthma    childhood   Chronic low back pain    CKD (chronic kidney disease), stage III (HCC)    However, most recent  creatinine 1.5.   Coronary artery disease involving native heart with other form of angina pectoris, unspecified vessel or lesion type [facility]) 11/16/2020   11/16/2020: CORONARY CA++ SCORE: Agatston Score 2245.  LAD 1093, LCx 959, RCA 193 (99th percentile) -> COR CTA (Agatston 2437) -moderate to severe multivessel CAD with significant blooming in all 3 epicardial vessels.  CAD RADS 3-4. FFRCT suggests mid RCA occlusion, => CARDIAC CATH 02/02/21: RCA: prox 70%, Mid-distal 90% & distal 90% (small caliber, Not viable PCI target; Prox RI 50%.   Depression    With anxiety   GERD (gastroesophageal reflux disease)    Headache    sinus headaches    Hypercholesterolemia    Hypertension    Iron deficiency anemia 05/26/2015   Has required transfusions-followed by Dr. Myna Hidalgo   Iron malabsorption 05/26/2015   Menopause    Numbness in both hands    mostly at night   Obesity    Osteoarthritis of both knees    Status post right TKA (and ankle) with plans for left TKA   Type 2 diabetes mellitus without complication, with long-term current use of insulin (HCC)    Type 2 on insulin pump   Vasovagal syncope 2018   Negative work-up   Vitamin D deficiency     Past Surgical History: Past Surgical History:  Procedure Laterality Date   COLONOSCOPY     EYE  left ventricle has normal function. The left ventricle has no regional  wall motion abnormalities. There is mild concentric left ventricular  hypertrophy. Left ventricular diastolic  parameters are indeterminate.   2. Right ventricular systolic function is normal. The right ventricular  size is normal. There is normal pulmonary artery systolic pressure.   3. The mitral valve is normal in structure. Mild mitral valve  regurgitation. No evidence of mitral stenosis.   4. The aortic valve is normal in structure. Aortic valve regurgitation is  not visualized. Aortic valve sclerosis/calcification is present, without  any evidence of aortic stenosis.   5. The inferior vena cava is normal in size with greater than 50%  respiratory variability, suggesting right atrial pressure of 3 mmHg.   Laboratory Data: High Sensitivity Troponin:   Recent Labs  Lab 01/25/23 1046 01/25/23 1224   TROPONINIHS 4 4     Cardiac EnzymesNo results for input(s): "TROPONINI" in the last 168 hours. No results for input(s): "TROPIPOC" in the last 168 hours.  Chemistry Recent Labs  Lab 01/25/23 1046 01/26/23 0401  NA 140 133*  K 4.0 4.0  CL 107 100  CO2 24 25  GLUCOSE 130* 272*  BUN 21 15  CREATININE 1.34* 1.17*  CALCIUM 8.9 8.8*  GFRNONAA 43* 51*  ANIONGAP 9 8    Recent Labs  Lab 01/25/23 1046 01/26/23 0401  PROT 6.4* 6.8  ALBUMIN 3.1* 3.2*  AST 27 21  ALT 30 29  ALKPHOS 58 56  BILITOT 0.9 0.9   Hematology Recent Labs  Lab 01/25/23 1046 01/26/23 0401  WBC 7.3 9.5  RBC 3.57* 3.67*  HGB 10.4* 10.9*  HCT 33.4* 34.9*  MCV 93.6 95.1  MCH 29.1 29.7  MCHC 31.1 31.2  RDW 13.2 13.2  PLT 172 154   BNPNo results for input(s): "BNP", "PROBNP" in the last 168 hours.  DDimer No results for input(s): "DDIMER" in the last 168 hours.  Radiology/Studies:  VAS [place] LOWER EXTREMITY VENOUS (DVT)  Result Date: 01/26/2023  Lower Venous DVT Study Patient Name:  Karen Gardner  Date of Exam:   01/26/2023 Medical Rec #: [id number]         Accession #:    [id number] Date of Birth: 07-18-1955         Patient Gender: F Patient Age:   18 years Exam Location:  [facility] Procedure:      VAS [place] LOWER EXTREMITY VENOUS (DVT) Referring Phys: Karen Gardner --------------------------------------------------------------------------------  Indications: Chest pain, Swelling, and Pain.  Comparison Study: No prior Performing Technologist: Karen Gardner  Examination Guidelines: A complete evaluation includes B-mode imaging, spectral Doppler, color Doppler, and power Doppler as needed of all accessible portions of each vessel. Bilateral testing is considered an integral part of a complete examination. Limited examinations for reoccurring indications may be performed as noted. The reflux portion of the exam is performed with the patient in reverse Trendelenburg.   +---------+---------------+---------+-----------+----------+--------------+ RIGHT    CompressibilityPhasicitySpontaneityPropertiesThrombus Aging +---------+---------------+---------+-----------+----------+--------------+ CFV      Full           Yes      Yes                                 +---------+---------------+---------+-----------+----------+--------------+ SFJ      Full                                                        +---------+---------------+---------+-----------+----------+--------------+

## 2023-01-26 NOTE — ED Notes (Signed)
ED TO INPATIENT HANDOFF REPORT  ED Nurse Name and Phone #: Dahlia Client [phone]  S Name/Age/Gender Karen Gardner 67 y.o. female Room/Bed: 036C/036C  Code Status   Code Status: Full Code  Home/SNF/Other Home Patient oriented to: self, place, time, and situation Is this baseline? Yes   Triage Complete: Triage complete  Chief Complaint Chest pain [R07.9]  Triage Note Pt BIB [facility] with reports of chest pain that woke her up from sleep. Pt reports moving makes the pain worse.    Allergies Allergies  Allergen Reactions   Codeine Shortness Of Breath   Lactose Intolerance (Gi) Diarrhea and Other (See Comments)    *Gas*    Oatmeal Other (See Comments)    "Gas"     Level of Care/Admitting Diagnosis ED Disposition     ED Disposition  Admit   Condition  --   Comment  Hospital Area: [facility] [[id number]]  Level of Care: Telemetry Cardiac [[id number]]  May place patient in observation at [facility] or [facility] if equivalent level of care is available:: No  Covid Evaluation: Asymptomatic - no recent exposure (last 10 days) testing not required  Diagnosis: Chest pain [629528]  Admitting Physician: Osvaldo Shipper [[id number]]  Attending Physician: Osvaldo Shipper [[id number]]          B Medical/Surgery History Past Medical History:  Diagnosis Date   Allergic rhinitis    Anxiety    Arthritis    Asthma    childhood   Chronic low back pain    CKD (chronic kidney disease), stage III (HCC)    However, most recent creatinine 1.5.   Coronary artery disease involving native heart with other form of angina pectoris, unspecified vessel or lesion type [facility]) 11/16/2020   11/16/2020: CORONARY CA++ SCORE: Agatston Score 2245.  LAD 1093, LCx 959, RCA 193 (99th percentile) -> COR CTA (Agatston 2437) -moderate to severe multivessel CAD with significant blooming in all 3 epicardial vessels.  CAD RADS 3-4. FFRCT suggests mid RCA occlusion, => CARDIAC CATH 02/02/21: RCA: prox 70%,  Mid-distal 90% & distal 90% (small caliber, Not viable PCI target; Prox RI 50%.   Depression    With anxiety   GERD (gastroesophageal reflux disease)    Headache    sinus headaches    Hypercholesterolemia    Hypertension    Iron deficiency anemia 05/26/2015   Has required transfusions-followed by Dr. Myna Hidalgo   Iron malabsorption 05/26/2015   Menopause    Numbness in both hands    mostly at night   Obesity    Osteoarthritis of both knees    Status post right TKA (and ankle) with plans for left TKA   Type 2 diabetes mellitus without complication, with long-term current use of insulin (HCC)    Type 2 on insulin pump   Vasovagal syncope 2018   Negative work-up   Vitamin D deficiency    Past Surgical History:  Procedure Laterality Date   COLONOSCOPY     EYE SURGERY Bilateral    [facility]    HERNIA REPAIR     umb hernia as child   KNEE ARTHROSCOPY  02/12/2012   Procedure: ARTHROSCOPY KNEE;  Surgeon: Nestor Lewandowsky, MD;  Location: [facility];  Service: Orthopedics;  Laterality: Right;  Partial Lateral Meniscectomy, Debridement chondromalacia   LAPAROSCOPIC GASTRIC SLEEVE RESECTION N/A 03/14/2016   Procedure: LAPAROSCOPIC GASTRIC SLEEVE RESECTION, WITH REPAIR OF HIATAL HERNIA REPAI, AND UPPER ENDO;  Surgeon: Luretha Murphy, MD;  Location: [facility];  Service: General;  Laterality: N/A;   LEFT HEART CATH AND CORONARY ANGIOGRAPHY N/A 02/02/2021   Procedure: LEFT HEART CATH AND CORONARY ANGIOGRAPHY;  Surgeon: Marykay Lex, MD;  Location: [facility] INVASIVE CV LAB;; Heavily calcified tortuous codominant RCA with proximal 70% stenosis.  Mid to distal RCA 90% stenosis.  Distal RCA 90%.  Not a PCI target.  50% RI, proximal to mid LAD 20%.  Mid to distal LCx 30% with 30% in LP AV-heavily calcified.  EF 55-65%.  Mildly elevated LVEDP.   ORIF ANKLE FRACTURE Right 02/11/2014   Procedure: OPEN REDUCTION INTERNAL FIXATION (ORIF) RIGHT ANKLE FRACTURE;  Surgeon: Nestor Lewandowsky, MD;  Location: [facility] OR;   Service: Orthopedics;  Laterality: Right;   TOTAL KNEE ARTHROPLASTY Right 08/05/2014   Procedure: TOTAL KNEE ARTHROPLASTY;  Surgeon: Gean Birchwood, MD;  Location: [facility] OR;  Service: Orthopedics;  Laterality: Right;   TRANSTHORACIC ECHOCARDIOGRAM  05/2016   EF 60 to 65%.  Severe basal septal LVH with mild concentric LVH.  No R WMA.  GR 1 DD.  Indeterminate filling pressures. Minimal valve disease.   TRANSTHORACIC ECHOCARDIOGRAM  03/29/2021   EF 55 to 60%.  No R WMA-normal strain pattern.  GR 1 DD.  Normal RV with RV P mild aortic sclerosis but no stenosis no AI.  Mildly elevated RAP.   TRIGGER FINGER RELEASE Right 10/14/2018   Procedure: RELEASE TRIGGER FINGER/A-1 PULLEY;  Surgeon: Betha Loa, MD;  Location: [facility];  Service: Orthopedics;  Laterality: Right;   UPPER GI ENDOSCOPY       A IV Location/Drains/Wounds Patient Lines/Drains/Airways Status     Active Line/Drains/Airways     Name Placement date Placement time Site Days   Peripheral IV 01/25/23 18 G Left Antecubital 01/25/23  --  Antecubital  1            Intake/Output Last 24 hours  Intake/Output Summary (Last 24 hours) at 01/26/2023 1223 Last data filed at 01/25/2023 1900 Gross per 24 hour  Intake 50 ml  Output --  Net 50 ml    Labs/Imaging Results for orders placed or performed during the hospital encounter of 01/25/23 (from the past 48 hour(s))  Hemoglobin A1c     Status: Abnormal   Collection Time: 01/25/23 10:43 AM  Result Value Ref Range   Hgb A1c MFr Bld 7.4 (H) 4.8 - 5.6 %    Comment: (NOTE) Pre diabetes:          5.7%-6.4%  Diabetes:              >6.4%  Glycemic control for   <7.0% adults with diabetes    Mean Plasma Glucose 165.68 mg/dL    Comment: Performed at [facility], [address] [place], [place] [address]  Basic metabolic panel     Status: Abnormal   Collection Time: 01/25/23 10:46 AM  Result Value Ref Range   Sodium 140 135 - 145 mmol/L   Potassium 4.0 3.5 -  5.1 mmol/L   Chloride 107 98 - 111 mmol/L   CO2 24 22 - 32 mmol/L   Glucose, Bld 130 (H) 70 - 99 mg/dL    Comment: Glucose reference range applies only to samples taken after fasting for at least 8 hours.   BUN 21 8 - 23 mg/dL   Creatinine, Ser 6.04 (H) 0.44 - 1.00 mg/dL   Calcium 8.9 8.9 - 54.0 mg/dL   GFR, Estimated 43 (L) >60 mL/min    Comment: (NOTE) Calculated using  the CKD-EPI Creatinine Equation (2021)    Anion gap 9 5 - 15    Comment: Performed at [facility], [address], [place], [place] [address]  CBC     Status: Abnormal   Collection Time: 01/25/23 10:46 AM  Result Value Ref Range   WBC 7.3 4.0 - 10.5 K/uL   RBC 3.57 (L) 3.87 - 5.11 MIL/uL   Hemoglobin 10.4 (L) 12.0 - 15.0 g/dL   HCT 60.4 (L) 54.0 - 98.1 %   MCV 93.6 80.0 - 100.0 fL   MCH 29.1 26.0 - 34.0 pg   MCHC 31.1 30.0 - 36.0 g/dL   RDW 19.1 47.8 - 29.5 %   Platelets 172 150 - 400 K/uL   nRBC 0.0 0.0 - 0.2 %    Comment: Performed at [facility], [address], [place], [place] [address]  Troponin I (High Sensitivity)     Status: None   Collection Time: 01/25/23 10:46 AM  Result Value Ref Range   Troponin I (High Sensitivity) 4 <18 ng/L    Comment: (NOTE) Elevated high sensitivity troponin I (hsTnI) values and significant  changes across serial measurements may suggest ACS but many other  chronic and acute conditions are known to elevate hsTnI results.  Refer to the "Links" section for chest pain algorithms and additional  guidance. Performed at [facility], [address] [place], [place] [address]   Hepatic function panel     Status: Abnormal   Collection Time: 01/25/23 10:46 AM  Result Value Ref Range   Total Protein 6.4 (L) 6.5 - 8.1 g/dL   Albumin 3.1 (L) 3.5 - 5.0 g/dL   AST 27 15 - 41 U/L   ALT 30 0 - 44 U/L   Alkaline Phosphatase 58 38 - 126 U/L   Total Bilirubin 0.9 0.3 - 1.2 mg/dL   Bilirubin, Direct 0.2 0.0 - 0.2 mg/dL   Indirect Bilirubin 0.7 0.3 - 0.9 mg/dL     Comment: Performed at [facility], [address], [place], [place] [address]  Lipase, blood     Status: None   Collection Time: 01/25/23 10:46 AM  Result Value Ref Range   Lipase 33 11 - 51 U/L    Comment: Performed at [facility], [address], [place], [place] [address]  Troponin I (High Sensitivity)     Status: None   Collection Time: 01/25/23 12:24 PM  Result Value Ref Range   Troponin I (High Sensitivity) 4 <18 ng/L    Comment: (NOTE) Elevated high sensitivity troponin I (hsTnI) values and significant  changes across serial measurements may suggest ACS but many other  chronic and acute conditions are known to elevate hsTnI results.  Refer to the "Links" section for chest pain algorithms and additional  guidance. Performed at [facility], [address], [place], [place] [address]   CBG monitoring, ED     Status: Abnormal   Collection Time: 01/26/23 12:11 AM  Result Value Ref Range   Glucose-Capillary 259 (H) 70 - 99 mg/dL    Comment: Glucose reference range applies only to samples taken after fasting for at least 8 hours.  Comprehensive metabolic panel     Status: Abnormal   Collection Time: 01/26/23  4:01 AM  Result Value Ref Range   Sodium 133 (L) 135 - 145 mmol/L    Comment: DELTA CHECK NOTED   Potassium 4.0 3.5 - 5.1 mmol/L   Chloride  100 98 - 111 mmol/L   CO2 25 22 - 32 mmol/L   Glucose, Bld 272 (H) 70 - 99 mg/dL    Comment: Glucose reference range applies only to samples taken after fasting for at least 8 hours.   BUN 15 8 - 23 mg/dL   Creatinine, Ser 1.61 (H) 0.44 - 1.00 mg/dL   Calcium 8.8 (L) 8.9 - 10.3 mg/dL   Total Protein 6.8 6.5 - 8.1 g/dL   Albumin 3.2 (L) 3.5 - 5.0 g/dL   AST 21 15 - 41 U/L   ALT 29 0 - 44 U/L   Alkaline Phosphatase 56 38 - 126 U/L   Total Bilirubin 0.9 0.3 - 1.2 mg/dL   GFR, Estimated 51 (L) >60 mL/min    Comment: (NOTE) Calculated using the CKD-EPI Creatinine Equation (2021)    Anion gap 8 5 - 15     Comment: Performed at [facility], [address] [place], [place] [address]  CBC     Status: Abnormal   Collection Time: 01/26/23  4:01 AM  Result Value Ref Range   WBC 9.5 4.0 - 10.5 K/uL   RBC 3.67 (L) 3.87 - 5.11 MIL/uL   Hemoglobin 10.9 (L) 12.0 - 15.0 g/dL   HCT 54.0 (L) 98.1 - 19.1 %   MCV 95.1 80.0 - 100.0 fL   MCH 29.7 26.0 - 34.0 pg   MCHC 31.2 30.0 - 36.0 g/dL   RDW 47.8 29.5 - 62.1 %   Platelets 154 150 - 400 K/uL   nRBC 0.0 0.0 - 0.2 %    Comment: Performed at [facility], [address] [place], [place] [address]  Protime-INR     Status: None   Collection Time: 01/26/23  4:01 AM  Result Value Ref Range   Prothrombin Time 14.5 11.4 - 15.2 seconds   INR 1.1 0.8 - 1.2    Comment: (NOTE) INR goal varies based on device and disease states. Performed at [facility], [address], [place], [place] [address]   CBG monitoring, ED     Status: Abnormal   Collection Time: 01/26/23  7:53 AM  Result Value Ref Range   Glucose-Capillary 247 (H) 70 - 99 mg/dL    Comment: Glucose reference range applies only to samples taken after fasting for at least 8 hours.  CBG monitoring, ED     Status: Abnormal   Collection Time: 01/26/23 11:06 AM  Result Value Ref Range   Glucose-Capillary 184 (H) 70 - 99 mg/dL    Comment: Glucose reference range applies only to samples taken after fasting for at least 8 hours.   Comment 1 Notify RN    Comment 2 Document in Chart    CT ANGIO CHEST/ABD/PEL FOR DISSECTION W &/OR WO CONTRAST  Result Date: 01/25/2023 CLINICAL DATA:  Chest pain EXAM: CT ANGIOGRAPHY CHEST, ABDOMEN AND PELVIS TECHNIQUE: Non-contrast CT of the chest was initially obtained. Multidetector CT imaging through the chest, abdomen and pelvis was performed using the standard protocol during bolus administration of intravenous contrast. Multiplanar reconstructed images and MIPs were obtained and reviewed to evaluate the vascular anatomy. RADIATION DOSE REDUCTION:  This exam was performed according to the departmental dose-optimization program which includes automated exposure control, adjustment of the mA and/or kV according to patient size and/or use of iterative reconstruction technique. CONTRAST:  OMNIPAQUE IOHEXOL 350 MG/ML SOLN COMPARISON:  07/16/2022 FINDINGS: CTA CHEST FINDINGS VASCULAR Aorta: Satisfactory opacification of the aorta. Normal contour  and caliber of the thoracic aorta. No evidence of aneurysm, dissection, or other acute aortic pathology. Mild mixed calcific atherosclerosis. Cardiovascular: No evidence of pulmonary embolism on limited non-tailored examination. Normal heart size. Three-vessel coronary artery calcifications and or stents. No pericardial effusion. Review of the MIP images confirms the above findings. NON VASCULAR Mediastinum/Nodes: No enlarged mediastinal, hilar, or axillary lymph nodes. Moderate hiatal hernia with intrathoracic position of the gastric fundus. Thyroid gland, trachea, and esophagus demonstrate no significant findings. Lungs/Pleura: Lungs are clear. No pleural effusion or pneumothorax. Musculoskeletal: No chest wall abnormality. No acute osseous findings. Review of the MIP images confirms the above findings. CTA ABDOMEN AND PELVIS FINDINGS VASCULAR Normal contour and caliber of the abdominal aorta. No evidence of aneurysm, dissection, or other acute aortic pathology. Replaced right hepatic artery arising from the superior mesenteric axis, with otherwise standard branching pattern of the abdominal aorta. Mild mixed calcific atherosclerosis. Review of the MIP images confirms the above findings. NON-VASCULAR Hepatobiliary: No solid liver abnormality is seen. No gallstones, gallbladder wall thickening, or biliary dilatation. Pancreas: Unremarkable. No pancreatic ductal dilatation or surrounding inflammatory changes. Spleen: Normal in size without significant abnormality. Adrenals/Urinary Tract: Adrenal glands are  unremarkable. Kidneys are normal, without renal calculi, solid lesion, or hydronephrosis. Bladder is unremarkable. Stomach/Bowel: Status post partial sleeve gastrectomy. Appendix appears normal. No evidence of bowel wall thickening, distention, or inflammatory changes. Lymphatic: No enlarged abdominal or pelvic lymph nodes. Reproductive: Calcified uterine fibroids. Other: No abdominal wall hernia or abnormality. No ascites. Musculoskeletal: No acute osseous findings. IMPRESSION: 1. Normal contour and caliber of the thoracic and abdominal aorta. No evidence of aneurysm, dissection, or other acute aortic pathology. Mild mixed calcific atherosclerosis. 2. Coronary artery disease. 3. Moderate hiatal hernia with intrathoracic position of the gastric fundus. 4. Status post partial sleeve gastrectomy. 5. Calcified uterine fibroids. Electronically Signed   By: Jearld Lesch M.D.   On: 01/25/2023 13:53   DG Chest 2 View  Result Date: 01/25/2023 CLINICAL DATA:  Chest pain EXAM: CHEST - 2 VIEW COMPARISON:  07/16/2022 FINDINGS: Cardiac and mediastinal contours are within normal limits. No focal pulmonary opacity. No pleural effusion or pneumothorax. No acute osseous abnormality. IMPRESSION: No acute cardiopulmonary process. Electronically Signed   By: Wiliam Ke M.D.   On: 01/25/2023 11:45    Pending Labs Unresulted Labs (From admission, onward)    None       Vitals/Pain Today's Vitals   01/26/23 0700 01/26/23 0900 01/26/23 1000 01/26/23 1207  BP: (!) 180/86 (!) 173/78 (!) 158/76   Pulse: 84 81 75   Resp: 20 (!) 21 20   Temp: 98 F (36.7 C)  98 F (36.7 C)   TempSrc: Oral     SpO2: 95% 99% 94%   Weight:      Height:      PainSc:    9     Isolation Precautions No active isolations  Medications Medications  pantoprazole (PROTONIX) injection 40 mg (40 mg Intravenous Given 01/26/23 0952)  ranolazine (RANEXA) 12 hr tablet 1,000 mg (1,000 mg Oral Given 01/26/23 0952)  escitalopram (LEXAPRO)  tablet 10 mg (10 mg Oral Given 01/25/23 2126)  traZODone (DESYREL) tablet 25 mg (has no administration in time range)  hydrALAZINE (APRESOLINE) injection 10 mg (has no administration in time range)  insulin aspart (novoLOG) injection 0-9 Units (2 Units Subcutaneous Given 01/26/23 1116)  enoxaparin (LOVENOX) injection 40 mg (has no administration in time range)  acetaminophen (TYLENOL) tablet 650 mg (has no administration in time  range)    Or  acetaminophen (TYLENOL) suppository 650 mg (has no administration in time range)  oxyCODONE (Oxy IR/ROXICODONE) immediate release tablet 5 mg (5 mg Oral Given 01/26/23 1208)  ondansetron (ZOFRAN) tablet 4 mg (has no administration in time range)    Or  ondansetron (ZOFRAN) injection 4 mg (has no administration in time range)  carvedilol (COREG) tablet 6.25 mg (6.25 mg Oral Given 01/26/23 0756)  amLODipine (NORVASC) tablet 10 mg (10 mg Oral Given 01/26/23 0951)  isosorbide mononitrate (IMDUR) 24 hr tablet 60 mg (60 mg Oral Given 01/26/23 0952)  latanoprost (XALATAN) 0.005 % ophthalmic solution 1 drop (has no administration in time range)  HYDROmorphone (DILAUDID) injection 0.5 mg (0.5 mg Intravenous Given 01/25/23 1138)  iohexol (OMNIPAQUE) 350 MG/ML injection 100 mL (100 mLs Intravenous Contrast Given 01/25/23 1236)  HYDROmorphone (DILAUDID) injection 1 mg (1 mg Intravenous Given 01/25/23 1347)  alum & mag hydroxide-simeth (MAALOX/MYLANTA) 200-200-20 MG/5ML suspension 30 mL (30 mLs Oral Given 01/25/23 1426)    And  lidocaine (XYLOCAINE) 2 % viscous mouth solution 15 mL (15 mLs Oral Given 01/25/23 1426)  famotidine (PEPCID) IVPB 20 mg premix (0 mg Intravenous Stopped 01/25/23 1900)  HYDROmorphone (DILAUDID) injection 0.5 mg (0.5 mg Intravenous Given 01/25/23 1802)  alum & mag hydroxide-simeth (MAALOX/MYLANTA) 200-200-20 MG/5ML suspension 30 mL (30 mLs Oral Given 01/25/23 1915)    And  lidocaine (XYLOCAINE) 2 % viscous mouth solution 15 mL (15 mLs Oral  Given 01/25/23 1915)  HYDROmorphone (DILAUDID) injection 0.5 mg (0.5 mg Intravenous Given 01/26/23 0501)    Mobility walks with person assist     Focused Assessments Cardiac Assessment Handoff:  Cardiac Rhythm: Normal sinus rhythm No results found for: "CKTOTAL", "CKMB", "CKMBINDEX", "TROPONINI" No results found for: "DDIMER" Does the Patient currently have chest pain? Yes  (cardiac cause ruled out; awaiting GI eval)   R Recommendations: See Admitting Provider Note  Report given to:   Additional Notes:

## 2023-01-26 NOTE — Anesthesia Preprocedure Evaluation (Signed)
Anesthesia Evaluation  Patient identified by MRN, date of birth, ID band Patient awake    Reviewed: Allergy & Precautions, NPO status , Patient's Chart, lab work & pertinent test results  Airway Mallampati: II  TM Distance: >3 FB Neck ROM: Full    Dental  (+) Dental Advisory Given, Teeth Intact   Pulmonary asthma , former smoker   Pulmonary exam normal breath sounds clear to auscultation       Cardiovascular hypertension, Pt. on medications and Pt. on home beta blockers + angina  + CAD and + DOE  Normal cardiovascular exam Rhythm:Regular Rate:Normal  Echo 07/2022  1. Left ventricular ejection fraction, by estimation, is 55 to 60%. The left ventricle has normal function. The left ventricle has no regional wall motion abnormalities. There is mild concentric left ventricular hypertrophy. Left ventricular diastolic parameters are indeterminate.   2. Right ventricular systolic function is normal. The right ventricular size is normal. There is normal pulmonary artery systolic pressure.   3. The mitral valve is normal in structure. Mild mitral valve regurgitation. No evidence of mitral stenosis.   4. The aortic valve is normal in structure. Aortic valve regurgitation is not visualized. Aortic valve sclerosis/calcification is present, without any evidence of aortic stenosis.   5. The inferior vena cava is normal in size with greater than 50% respiratory variability, suggesting right atrial pressure of 3 mmHg.    Cath 02/2021  Moderate-severe diffusely calcified coronary arteries with severe single-vessel disease involving a small caliber codominant RCA with multiple segments of 70 to 90% stenoses-best treated medically.  Diffuse mild to moderate stenosis throughout the left coronary system-most notably 50% Ramus Intermedius.  Mildly elevated LVEDP with systemic hypertension.  Very tortuous Left Subclavian-Innominate Artery system ->   FOR  FUTURE CARDIAC CATHETERIZATIONS, WOULD RECOMMEND FEMORAL ACCESS DUE TO VERY DIFFICULT MANIPULATION OF CATHETERS, AND ARTERIAL SPASM.    RECOMMENDATIONS  Aggressive risk factor modification with lipid, glycemic and blood pressure management  Daily aspirin  Add carvedilol 3.125 mg daily with plans to titrate up further in outpatient setting.  Hold metformin 48 hours post cath    Neuro/Psych  Headaches PSYCHIATRIC DISORDERS Anxiety Depression       GI/Hepatic Neg liver ROS, hiatal hernia,GERD  ,,  Endo/Other  diabetes    Renal/GU Renal disease     Musculoskeletal  (+) Arthritis ,    Abdominal   Peds  Hematology  (+) Blood dyscrasia, anemia   Anesthesia Other Findings   Reproductive/Obstetrics                             Anesthesia Physical Anesthesia Plan  ASA: 3  Anesthesia Plan: MAC   Post-op Pain Management: Minimal or no pain anticipated   Induction: Intravenous  PONV Risk Score and Plan: 2 and Propofol infusion, TIVA and Treatment may vary due to age or medical condition  Airway Management Planned: Simple Face Mask and Natural Airway  Additional Equipment:   Intra-op Plan:   Post-operative Plan:   Informed Consent: I have reviewed the patients History and Physical, chart, labs and discussed the procedure including the risks, benefits and alternatives for the proposed anesthesia with the patient or authorized representative who has indicated his/her understanding and acceptance.     Dental advisory given  Plan Discussed with: CRNA  Anesthesia Plan Comments:        Anesthesia Quick Evaluation

## 2023-01-26 NOTE — Progress Notes (Addendum)
[facility] PROGRESS NOTE   Karen Gardner WJX:[id number] DOB: [birth year] DOA: 01/25/2023  PCP: [name], MD  Brief History: 67 y.o. female with a past medical history of essential hypertension, diabetes mellitus type 2, coronary artery disease, GERD, hyperlipidemia who was in her usual state of health till the morning of admission when she woke up from sleep with chest pain located in the central part of the chest.  Troponins were negative.  EKG did not show any ischemic changes.  CT angiogram ruled out dissection and PE.  She was hospitalized for further management due to persistent pain.    Consultants: Gastroenterology  Procedures: None yet    Subjective/Interval History: Patient still experiencing a lot of discomfort in the central part of her chest.  It is on and off.    Assessment/Plan:  Central chest pain Broad differential. Acute coronary syndrome was ruled out.  Do not suspect cardiac etiology since EKG did not show any ischemic changes plus the character of her pain is not suggestive of cardiac etiology. There is a component of pleurisy. CT angiogram was negative for dissection or PE. ?Pericarditis. No pericardial effusion noted on CT. CT scan did show a moderate hiatal hernia which could be responsible for her symptoms.  Gastroenterology has been consulted. Continue with PPI.  Will consider Carafate once she has been seen by gastroenterology.  Pedal edema Lower extremity Doppler studies pending.  History of coronary artery disease Followed by Dr. Herbie Baltimore. Her last cardiac catheterization was in November 2022: moderate to severe diffusely calcified coronary arteries with severe single-vessel disease involving a small caliber codominant RCA with multiple segments of 70-90% stenosis was noted.  It was felt that this would be best treated medically.  She is on aspirin beta-blocker statin prior to admission.  Also on Ranexa which will be continued for  now. As mentioned above her current symptoms are unlikely due to cardiac etiology.  Diabetes mellitus type 2 Monitor CBGs.  SSI.  Holding metformin.  Essential hypertension Blood pressure is poorly controlled.  Resume amlodipine and nitrates.  Continue with carvedilol.  Hydralazine as needed.  Hiatal hernia/status post gastric sleeve surgery  The gastric sleeve surgery was done in 2015 for weight loss purposes.  Hiatal hernia noted on CT scan which could be contributing to his symptoms. See above.  Normocytic anemia Hemoglobin close to baseline.  No evidence of overt bleeding.  Chronic kidney disease stage IIIa Renal function close to baseline.  Avoid nephrotoxic agents.   DVT Prophylaxis: Lovenox Code Status: Full code Family Communication: Discussed with patient Disposition Plan: To be determined.  Hopefully return home when improved  Status is: Observation The patient will require care spanning > 2 midnights and should be moved to inpatient because: Persistent, uncontrolled pain    Medications: Scheduled:  amLODipine  10 mg Oral Daily   carvedilol  6.25 mg Oral BID WC   enoxaparin (LOVENOX) injection  40 mg Subcutaneous Q24H   escitalopram  10 mg Oral QHS   insulin aspart  0-9 Units Subcutaneous TID WC   isosorbide mononitrate  60 mg Oral Daily   latanoprost  1 drop Both Eyes QHS   pantoprazole (PROTONIX) IV  40 mg Intravenous Q12H   ranolazine  1,000 mg Oral BID   Continuous: OZH:YQMVHQIONGEXB **OR** acetaminophen, hydrALAZINE, ondansetron **OR** ondansetron (ZOFRAN) IV, oxyCODONE, traZODone  Antibiotics: Anti-infectives (From admission, onward)    None       Objective:  Vital Signs  Vitals:   01/26/23 0200  01/26/23 0413 01/26/23 0600 01/26/23 0700  BP: (!) 167/91 (!) 178/86 (!) 175/82 (!) 180/86  Pulse: 87 89 82 84  Resp: (!) 22 (!) 25 (!) 21 20  Temp:  99.1 F (37.3 C)  98 F (36.7 C)  TempSrc:  Oral  Oral  SpO2: 91% 95% 92% 95%  Weight:       Height:        Intake/Output Summary (Last 24 hours) at 01/26/2023 0931 Last data filed at 01/25/2023 1900 Gross per 24 hour  Intake 50 ml  Output --  Net 50 ml   Filed Weights   01/25/23 1317  Weight: 81.6 kg    General appearance: Awake alert.  In no distress Resp: Clear to auscultation bilaterally.  Normal effort Some tenderness to palpation over the chest wall but does not reproduce her pain.  No skin rashes noted. Cardio: S1-S2 is normal regular.  No S3-S4.  No rubs murmurs or bruit GI: Abdomen is soft.  Nontender nondistended.  Bowel sounds are present normal.  No masses organomegaly Extremities: No edema.  Full range of motion of lower extremities. Neurologic: Alert and oriented x3.  No focal neurological deficits.    Lab Results:  Data Reviewed: I have personally reviewed following labs and reports of the imaging studies  CBC: Recent Labs  Lab 01/25/23 1046 01/26/23 0401  WBC 7.3 9.5  HGB 10.4* 10.9*  HCT 33.4* 34.9*  MCV 93.6 95.1  PLT 172 154    Basic Metabolic Panel: Recent Labs  Lab 01/25/23 1046 01/26/23 0401  NA 140 133*  K 4.0 4.0  CL 107 100  CO2 24 25  GLUCOSE 130* 272*  BUN 21 15  CREATININE 1.34* 1.17*  CALCIUM 8.9 8.8*    GFR: Estimated Creatinine Clearance: 47.9 mL/min (A) (by C-G formula based on SCr of 1.17 mg/dL (H)).  Liver Function Tests: Recent Labs  Lab 01/25/23 1046 01/26/23 0401  AST 27 21  ALT 30 29  ALKPHOS 58 56  BILITOT 0.9 0.9  PROT 6.4* 6.8  ALBUMIN 3.1* 3.2*    Recent Labs  Lab 01/25/23 1046  LIPASE 33    Coagulation Profile: Recent Labs  Lab 01/26/23 0401  INR 1.1     HbA1C: Recent Labs    01/25/23 1043  HGBA1C 7.4*    CBG: Recent Labs  Lab 01/26/23 0011 01/26/23 0753  GLUCAP 259* 247*     Radiology Studies: CT ANGIO CHEST/ABD/PEL FOR DISSECTION W &/OR WO CONTRAST  Result Date: 01/25/2023 CLINICAL DATA:  Chest pain EXAM: CT ANGIOGRAPHY CHEST, ABDOMEN AND PELVIS TECHNIQUE:  Non-contrast CT of the chest was initially obtained. Multidetector CT imaging through the chest, abdomen and pelvis was performed using the standard protocol during bolus administration of intravenous contrast. Multiplanar reconstructed images and MIPs were obtained and reviewed to evaluate the vascular anatomy. RADIATION DOSE REDUCTION: This exam was performed according to the departmental dose-optimization program which includes automated exposure control, adjustment of the mA and/or kV according to patient size and/or use of iterative reconstruction technique. CONTRAST:  OMNIPAQUE IOHEXOL 350 MG/ML SOLN COMPARISON:  07/16/2022 FINDINGS: CTA CHEST FINDINGS VASCULAR Aorta: Satisfactory opacification of the aorta. Normal contour and caliber of the thoracic aorta. No evidence of aneurysm, dissection, or other acute aortic pathology. Mild mixed calcific atherosclerosis. Cardiovascular: No evidence of pulmonary embolism on limited non-tailored examination. Normal heart size. Three-vessel coronary artery calcifications and or stents. No pericardial effusion. Review of the MIP images confirms the above findings. NON VASCULAR Mediastinum/Nodes:  No enlarged mediastinal, hilar, or axillary lymph nodes. Moderate hiatal hernia with intrathoracic position of the gastric fundus. Thyroid gland, trachea, and esophagus demonstrate no significant findings. Lungs/Pleura: Lungs are clear. No pleural effusion or pneumothorax. Musculoskeletal: No chest wall abnormality. No acute osseous findings. Review of the MIP images confirms the above findings. CTA ABDOMEN AND PELVIS FINDINGS VASCULAR Normal contour and caliber of the abdominal aorta. No evidence of aneurysm, dissection, or other acute aortic pathology. Replaced right hepatic artery arising from the superior mesenteric axis, with otherwise standard branching pattern of the abdominal aorta. Mild mixed calcific atherosclerosis. Review of the MIP images confirms the above  findings. NON-VASCULAR Hepatobiliary: No solid liver abnormality is seen. No gallstones, gallbladder wall thickening, or biliary dilatation. Pancreas: Unremarkable. No pancreatic ductal dilatation or surrounding inflammatory changes. Spleen: Normal in size without significant abnormality. Adrenals/Urinary Tract: Adrenal glands are unremarkable. Kidneys are normal, without renal calculi, solid lesion, or hydronephrosis. Bladder is unremarkable. Stomach/Bowel: Status post partial sleeve gastrectomy. Appendix appears normal. No evidence of bowel wall thickening, distention, or inflammatory changes. Lymphatic: No enlarged abdominal or pelvic lymph nodes. Reproductive: Calcified uterine fibroids. Other: No abdominal wall hernia or abnormality. No ascites. Musculoskeletal: No acute osseous findings. IMPRESSION: 1. Normal contour and caliber of the thoracic and abdominal aorta. No evidence of aneurysm, dissection, or other acute aortic pathology. Mild mixed calcific atherosclerosis. 2. Coronary artery disease. 3. Moderate hiatal hernia with intrathoracic position of the gastric fundus. 4. Status post partial sleeve gastrectomy. 5. Calcified uterine fibroids. Electronically Signed   By: Jearld Lesch M.D.   On: 01/25/2023 13:53   DG Chest 2 View  Result Date: 01/25/2023 CLINICAL DATA:  Chest pain EXAM: CHEST - 2 VIEW COMPARISON:  07/16/2022 FINDINGS: Cardiac and mediastinal contours are within normal limits. No focal pulmonary opacity. No pleural effusion or pneumothorax. No acute osseous abnormality. IMPRESSION: No acute cardiopulmonary process. Electronically Signed   By: Wiliam Ke M.D.   On: 01/25/2023 11:45       LOS: 0 days   Rosa Wyly Rito Ehrlich  [facility] Pager on [email or web address]  01/26/2023, 9:31 AM

## 2023-01-27 ENCOUNTER — Encounter (HOSPITAL_COMMUNITY)
Admission: EM | Disposition: A | Discharge status: Home / Self Care | Payer: Self-pay | Source: Home / Self Care | Attending: Internal Medicine

## 2023-01-27 ENCOUNTER — Inpatient Hospital Stay (HOSPITAL_COMMUNITY): Payer: Self-pay | Admitting: Anesthesiology

## 2023-01-27 ENCOUNTER — Inpatient Hospital Stay (HOSPITAL_COMMUNITY): Payer: Medicare Other | Admitting: Anesthesiology

## 2023-01-27 ENCOUNTER — Encounter (HOSPITAL_COMMUNITY): Payer: Self-pay | Admitting: Internal Medicine

## 2023-01-27 DIAGNOSIS — I2510 Atherosclerotic heart disease of native coronary artery without angina pectoris: Secondary | ICD-10-CM | POA: Diagnosis not present

## 2023-01-27 DIAGNOSIS — K2289 Other specified disease of esophagus: Secondary | ICD-10-CM

## 2023-01-27 DIAGNOSIS — E119 Type 2 diabetes mellitus without complications: Secondary | ICD-10-CM | POA: Diagnosis not present

## 2023-01-27 DIAGNOSIS — R079 Chest pain, unspecified: Secondary | ICD-10-CM | POA: Diagnosis not present

## 2023-01-27 DIAGNOSIS — I25119 Atherosclerotic heart disease of native coronary artery with unspecified angina pectoris: Secondary | ICD-10-CM | POA: Diagnosis not present

## 2023-01-27 DIAGNOSIS — I10 Essential (primary) hypertension: Secondary | ICD-10-CM | POA: Diagnosis not present

## 2023-01-27 HISTORY — PX: ESOPHAGOGASTRODUODENOSCOPY: SHX5428

## 2023-01-27 HISTORY — PX: BIOPSY: SHX5522

## 2023-01-27 LAB — GLUCOSE, CAPILLARY
Glucose-Capillary: 245 mg/dL — ABNORMAL HIGH (ref 70–99)
Glucose-Capillary: 212 mg/dL — ABNORMAL HIGH (ref 70–99)
Glucose-Capillary: 183 mg/dL — ABNORMAL HIGH (ref 70–99)
Glucose-Capillary: 268 mg/dL — ABNORMAL HIGH (ref 70–99)
Glucose-Capillary: 371 mg/dL — ABNORMAL HIGH (ref 70–99)

## 2023-01-27 SURGERY — EGD (ESOPHAGOGASTRODUODENOSCOPY)
Anesthesia: Monitor Anesthesia Care

## 2023-01-27 MED ORDER — SODIUM CHLORIDE 0.9 % IV SOLN: INTRAVENOUS | Status: DC | PRN

## 2023-01-27 MED ORDER — PREDNISONE 20 MG PO TABS
20.0000 mg | ORAL_TABLET | Freq: Two times a day (BID) | ORAL | Status: DC
  Administered 2023-01-27 – 2023-01-28 (×2): 20 mg via ORAL
  Filled 2023-01-27 (×2): qty 1

## 2023-01-27 MED ORDER — PROPOFOL 500 MG/50ML IV EMUL
INTRAVENOUS | Status: DC | PRN
  Administered 2023-01-27: 135 ug/kg/min via INTRAVENOUS

## 2023-01-27 MED ORDER — ASPIRIN 325 MG PO TABS
650.0000 mg | ORAL_TABLET | Freq: Three times a day (TID) | ORAL | Status: DC
  Administered 2023-01-27 – 2023-01-30 (×9): 650 mg via ORAL
  Filled 2023-01-27 (×10): qty 2

## 2023-01-27 MED ORDER — COLCHICINE 0.6 MG PO TABS
0.6000 mg | ORAL_TABLET | Freq: Two times a day (BID) | ORAL | Status: DC
  Administered 2023-01-27 – 2023-01-30 (×6): 0.6 mg via ORAL
  Filled 2023-01-27 (×8): qty 1

## 2023-01-27 MED ORDER — PROPOFOL 10 MG/ML IV BOLUS
INTRAVENOUS | Status: DC | PRN
  Administered 2023-01-27 (×2): 30 mg via INTRAVENOUS

## 2023-01-27 MED ORDER — ASPIRIN 325 MG PO TABS: 650.0000 mg | ORAL_TABLET | Freq: Two times a day (BID) | ORAL | Status: DC

## 2023-01-27 NOTE — Transfer of Care (Signed)
Immediate Anesthesia Transfer of Care Note  Patient: Karen Gardner  Procedure(s) Performed: ESOPHAGOGASTRODUODENOSCOPY (EGD) BIOPSY  Patient Location: PACU  Anesthesia Type:MAC  Level of Consciousness: awake and drowsy  Airway & Oxygen Therapy: Patient Spontanous Breathing and Patient connected to nasal cannula oxygen  Post-op Assessment: Report given to RN and Post -op Vital signs reviewed and stable  Post vital signs: Reviewed and stable  Last Vitals:  Vitals Value Taken Time  BP    Temp    Pulse 82 01/27/23 1102  Resp 22 01/27/23 1102  SpO2 92 % 01/27/23 1102  Vitals shown include unfiled device data.  Last Pain:  Vitals:   01/27/23 0954  TempSrc: Temporal  PainSc: 8          Complications: No notable events documented.

## 2023-01-27 NOTE — Op Note (Signed)
[facility] Patient Name: Karen Gardner Procedure Date : 01/27/2023 MRN: [id number] Attending MD: Liliane Shi DO, DO, [id number] Date of Birth: [birth year] CSN: [id number] Age: 67 Admit Type: Inpatient Procedure:                Upper GI endoscopy Indications:              Dysphagia, Unexplained chest pain Providers:                Liliane Shi DO, DO, Jacquelyn "Jaci" Clelia Croft, RN,                            Doristine Mango, RN, Kandice Robinsons, Technician,                            Geoffery Lyons, Technician Referring MD:              Medicines:                See the Anesthesia note for documentation of the                            administered medications Complications:            No immediate complications. Estimated Blood Loss:     Estimated blood loss was minimal. Procedure:                Pre-Anesthesia Assessment:                           - ASA Grade Assessment: III - A patient with severe                            systemic disease.                           - The risks and benefits of the procedure and the                            sedation options and risks were discussed with the                            patient. All questions were answered and informed                            consent was obtained.                           After obtaining informed consent, the endoscope was                            passed under direct vision. Throughout the                            procedure, the patient's blood pressure, pulse, and                            oxygen saturations were monitored  continuously. The                            [id number]-H190 ([id number]) Olympus endoscope was introduced                            through the mouth, and advanced to the second part                            of duodenum. The upper GI endoscopy was                            accomplished without difficulty. The patient                            tolerated the procedure  well. Scope In: Scope Out: Findings:      The esophagus and gastroesophageal junction were examined with white       light. There were esophageal mucosal changes suspicious for long-segment       Barrett's esophagus. These changes involved the mucosa extending to the       Z-line (33 cm from the incisors). Salmon-colored mucosa was present. The       maximum longitudinal extent of these esophageal mucosal changes was 2 cm       in length. Mucosa was biopsied with a cold forceps for histology. One       specimen bottle was sent to pathology.      Evidence of a sleeve gastrectomy was found in the entire examined       stomach. This was characterized by erythema. Biopsies were taken with a       cold forceps for Helicobacter pylori testing.      Multiple 5 mm sessile polyps with no bleeding and no stigmata of recent       bleeding were found in the gastric body. Biopsies were taken with a cold       forceps for histology.      The examined duodenum was normal. Impression:               - Esophageal mucosal changes suspicious for                            long-segment Barrett's esophagus. Biopsied.                           - A sleeve gastrectomy was found, characterized by                            erythema. Biopsied.                           - Normal examined duodenum. Recommendation:           - Await pathology results.                           - Resume regular diet.                           -  Continue present medications.                           - Consider further cardiac evaluation to rule out                            chronic angina versus costochondritis versus                            musculoskeletal etiology of pain. Procedure Code(s):        --- Professional ---                           [phone], Esophagogastroduodenoscopy, flexible,                            transoral; with biopsy, single or multiple Diagnosis Code(s):        --- Professional ---                            K22.89, Other specified disease of esophagus                           Z98.84, Bariatric surgery status                           R13.10, Dysphagia, unspecified                           R07.9, Chest pain, unspecified CPT copyright 2022 [employer]. All rights reserved. The codes documented in this report are preliminary and upon coder review may  be revised to meet current compliance requirements. Dr Liliane Shi, DO Liliane Shi DO, DO 01/27/2023 11:11:14 AM Number of Addenda: 0

## 2023-01-27 NOTE — Interval H&P Note (Signed)
History and Physical Interval Note:  01/27/2023 10:27 AM  Karen Gardner  has presented today for surgery, with the diagnosis of Intractable epigastric and atypical chest pain.  The various methods of treatment have been discussed with the patient and family. After consideration of risks, benefits and other options for treatment, the patient has consented to  Procedure(s): ESOPHAGOGASTRODUODENOSCOPY (EGD) (N/A) as a surgical intervention.  The patient's history has been reviewed, patient examined, no change in status, stable for surgery.  I have reviewed the patient's chart and labs.  Questions were answered to the patient's satisfaction.     Lynann Bologna

## 2023-01-27 NOTE — Progress Notes (Signed)
Cardiology Progress Note  Patient ID: AMENIA DEVAULT MRN: [id number] DOB: 02-23-1956 Date of Encounter: 01/27/2023 Primary Cardiologist: Bryan Lemma, MD  Subjective   Chief Complaint: CP  HPI: Still with significant chest discomfort.  CRP elevated.  Plan for EGD today given dysphagia.  ROS:  All other ROS reviewed and negative. Pertinent positives noted in the HPI.     Vital Signs   Vitals:   01/26/23 1951 01/26/23 2342 01/27/23 0448 01/27/23 0811  BP: (!) 147/83 138/71 (!) 157/80 (!) 155/74  Pulse: 96 85 81 83  Resp:  17  18  Temp: 99.3 F (37.4 C) 100.1 F (37.8 C) 99.3 F (37.4 C) 98 F (36.7 C)  TempSrc: Oral  Oral   SpO2: 96% 100% 93% 94%  Weight:      Height:       No intake or output data in the 24 hours ending 01/27/23 0925    01/25/2023    1:17 PM 11/14/2022    9:57 AM 10/12/2022   10:07 AM  Last 3 Weights  Weight (lbs) 180 lb 176 lb 12.8 oz 176 lb 3.2 oz  Weight (kg) 81.647 kg 80.196 kg 79.924 kg      Telemetry  Overnight telemetry shows SR 80s, which I personally reviewed.    Physical Exam   Vitals:   01/26/23 1951 01/26/23 2342 01/27/23 0448 01/27/23 0811  BP: (!) 147/83 138/71 (!) 157/80 (!) 155/74  Pulse: 96 85 81 83  Resp:  17  18  Temp: 99.3 F (37.4 C) 100.1 F (37.8 C) 99.3 F (37.4 C) 98 F (36.7 C)  TempSrc: Oral  Oral   SpO2: 96% 100% 93% 94%  Weight:      Height:       No intake or output data in the 24 hours ending 01/27/23 0925     01/25/2023    1:17 PM 11/14/2022    9:57 AM 10/12/2022   10:07 AM  Last 3 Weights  Weight (lbs) 180 lb 176 lb 12.8 oz 176 lb 3.2 oz  Weight (kg) 81.647 kg 80.196 kg 79.924 kg    Body mass index is 29.95 kg/m.  General: Well nourished, well developed, in no acute distress Head: Atraumatic, normal size  Eyes: PEERLA, EOMI  Neck: Supple, no JVD Endocrine: No thryomegaly Cardiac: Normal S1, S2; RRR; no murmurs, rubs, or gallops Lungs: Clear to auscultation bilaterally, no wheezing, rhonchi  or rales  Abd: Soft, nontender, no hepatomegaly  Ext: No edema, pulses 2+ Musculoskeletal: No deformities, BUE and BLE strength normal and equal Skin: Warm and dry, no rashes   Neuro: Alert and oriented to person, place, time, and situation, CNII-XII grossly intact, no focal deficits  Psych: Normal mood and affect   Cardiac Studies  TTE 07/17/2022  1. Left ventricular ejection fraction, by estimation, is 55 to 60%. The  left ventricle has normal function. The left ventricle has no regional  wall motion abnormalities. There is mild concentric left ventricular  hypertrophy. Left ventricular diastolic  parameters are indeterminate.   2. Right ventricular systolic function is normal. The right ventricular  size is normal. There is normal pulmonary artery systolic pressure.   3. The mitral valve is normal in structure. Mild mitral valve  regurgitation. No evidence of mitral stenosis.   4. The aortic valve is normal in structure. Aortic valve regurgitation is  not visualized. Aortic valve sclerosis/calcification is present, without  any evidence of aortic stenosis.   5. The inferior vena cava is  normal in size with greater than 50%  respiratory variability, suggesting right atrial pressure of 3 mmHg.   LHC 02/02/2021 Moderate-severe diffusely calcified coronary arteries with severe single-vessel disease involving a small caliber codominant RCA with multiple segments of 70 to 90% stenoses-best treated medically. Diffuse mild to moderate stenosis throughout the left coronary system-most notably 50% Ramus Intermedius. Mildly elevated LVEDP with systemic hypertension.  Patient Profile  LADA BEAVER is a 67 y.o. female with CAD, diabetes, CKD stage IIIa, hypertension admitted on 01/26/2023 with acute chest pain.  Assessment & Plan   # Chest pain # Dysphagia # Elevated CRP -Admitted with sharp chest discomfort.  EKG without acute ischemic changes and troponins are negative.  Given  dysphagia plan is to proceed with EGD today. -Suspect this could be pericarditis.  We will await GI evaluation.  If there is no identifiable GI pathology I would treat her for acute pericarditis.  We will await EGD today. -Troponins are negative.  Nothing here to suggest myocarditis.  Stable for EGD today.  # CAD -Diffuse RCA disease that is being managed medically.  Aspirin being held for EGD. -Continue home Imdur and Ranexa.  She is also on carvedilol 6.25 mg twice daily and amlodipine 10 mg daily. -Restart home statin as you are able.  # CKD stage IIIa -Stable.      For questions or updates, please contact [facility] Please consult [email or web address] for contact info under        Signed, Gerri Spore T. Flora Lipps, MD, [facility] [place]  [facility]  01/27/2023 9:25 AM

## 2023-01-27 NOTE — Anesthesia Postprocedure Evaluation (Signed)
Anesthesia Post Note  Patient: Karen Gardner  Procedure(s) Performed: ESOPHAGOGASTRODUODENOSCOPY (EGD) BIOPSY     Patient location during evaluation: PACU Anesthesia Type: MAC Level of consciousness: awake and alert Pain management: pain level controlled Vital Signs Assessment: post-procedure vital signs reviewed and stable Respiratory status: spontaneous breathing Cardiovascular status: stable Anesthetic complications: no   No notable events documented.  Last Vitals:  Vitals:   01/27/23 1125 01/27/23 1127  BP: 106/62   Pulse:  72  Resp:  15  Temp:    SpO2: 92% 92%    Last Pain:  Vitals:   01/27/23 1205  TempSrc:   PainSc: 7                  Lewie Loron

## 2023-01-27 NOTE — Progress Notes (Addendum)
TID WC   [MAR Hold] isosorbide mononitrate  60 mg Oral Daily   [MAR Hold] latanoprost  1 drop Both Eyes QHS   [MAR Hold] pantoprazole (PROTONIX) IV  40 mg Intravenous Q12H   [MAR Hold] ranolazine  1,000 mg Oral BID   Continuous: PRN:[MAR Hold]  acetaminophen **OR** [MAR Hold] acetaminophen, [MAR Hold] hydrALAZINE, [MAR Hold]  HYDROmorphone (DILAUDID) injection, [MAR Hold] lidocaine, [MAR Hold] ondansetron **OR** [MAR Hold] ondansetron (ZOFRAN) IV, [MAR Hold] oxyCODONE, [MAR Hold] traZODone  Antibiotics: Anti-infectives (From admission, onward)    None       Objective:  Vital Signs  Vitals:   01/26/23 2342 01/27/23 0448 01/27/23 0811 01/27/23 0954  BP: 138/71 (!) 157/80 (!) 155/74 131/77  Pulse: 85 81 83 78  Resp: 17  18 17   Temp: 100.1 F (37.8 C) 99.3 F (37.4 C) 98 F (36.7 C) 98.1 F (36.7 C)  TempSrc:  Oral  Temporal  SpO2: 100% 93% 94% 95%  Weight:      Height:       No intake or output data in the 24 hours ending 01/27/23 0959  Filed Weights   01/25/23 1317  Weight: 81.6 kg    General appearance: Awake alert.  In no distress Resp: Clear to auscultation bilaterally.  Normal effort Cardio: S1-S2 is normal regular.  No S3-S4.  No rubs murmurs or bruit GI: Abdomen is soft.  Nontender nondistended.  Bowel sounds are present normal.  No masses organomegaly Extremities: No edema.  Full range of motion of lower extremities. Neurologic: Alert and oriented x3.  No focal neurological deficits.    Lab Results:  Data Reviewed: I have personally reviewed following labs and reports of the imaging studies  CBC: Recent Labs  Lab 01/25/23 1046 01/26/23 0401  WBC 7.3 9.5  HGB 10.4* 10.9*  HCT 33.4* 34.9*  MCV 93.6 95.1  PLT 172 154    Basic Metabolic Panel: Recent Labs  Lab 01/25/23 1046 01/26/23 0401  NA 140 133*  K 4.0 4.0  CL 107 100  CO2 24 25  GLUCOSE 130* 272*  BUN 21 15  CREATININE 1.34* 1.17*  CALCIUM 8.9 8.8*    GFR: Estimated Creatinine Clearance: 47.9 mL/min (A) (by C-G formula based on SCr of 1.17 mg/dL (H)).  Liver Function Tests: Recent Labs  Lab 01/25/23 1046 01/26/23 0401  AST 27 21  ALT 30 29  ALKPHOS 58 56  BILITOT 0.9 0.9  PROT 6.4* 6.8  ALBUMIN 3.1* 3.2*     Recent Labs  Lab 01/25/23 1046  LIPASE 33    Coagulation Profile: Recent Labs  Lab 01/26/23 0401  INR 1.1     HbA1C: Recent Labs    01/25/23 1043  HGBA1C 7.4*    CBG: Recent Labs  Lab 01/26/23 0753 01/26/23 1106 01/26/23 1800 01/26/23 2053 01/27/23 0811  GLUCAP 247* 184* 295* 309* 245*     Radiology Studies: VAS [place] LOWER EXTREMITY VENOUS (DVT)  Result Date: 01/26/2023  Lower Venous DVT Study Patient Name:  Karen Gardner  Date of Exam:   01/26/2023 Medical Rec #: [id number]         Accession #:    [id number] Date of Birth: 06-17-1955         Patient Gender: F Patient Age:   67 years Exam Location:  [facility] Procedure:      VAS [place] LOWER EXTREMITY VENOUS (DVT) Referring Phys: Osvaldo Shipper --------------------------------------------------------------------------------  Indications: Chest pain, Swelling, and Pain.  Comparison Study: No  TID WC   [MAR Hold] isosorbide mononitrate  60 mg Oral Daily   [MAR Hold] latanoprost  1 drop Both Eyes QHS   [MAR Hold] pantoprazole (PROTONIX) IV  40 mg Intravenous Q12H   [MAR Hold] ranolazine  1,000 mg Oral BID   Continuous: PRN:[MAR Hold]  acetaminophen **OR** [MAR Hold] acetaminophen, [MAR Hold] hydrALAZINE, [MAR Hold]  HYDROmorphone (DILAUDID) injection, [MAR Hold] lidocaine, [MAR Hold] ondansetron **OR** [MAR Hold] ondansetron (ZOFRAN) IV, [MAR Hold] oxyCODONE, [MAR Hold] traZODone  Antibiotics: Anti-infectives (From admission, onward)    None       Objective:  Vital Signs  Vitals:   01/26/23 2342 01/27/23 0448 01/27/23 0811 01/27/23 0954  BP: 138/71 (!) 157/80 (!) 155/74 131/77  Pulse: 85 81 83 78  Resp: 17  18 17   Temp: 100.1 F (37.8 C) 99.3 F (37.4 C) 98 F (36.7 C) 98.1 F (36.7 C)  TempSrc:  Oral  Temporal  SpO2: 100% 93% 94% 95%  Weight:      Height:       No intake or output data in the 24 hours ending 01/27/23 0959  Filed Weights   01/25/23 1317  Weight: 81.6 kg    General appearance: Awake alert.  In no distress Resp: Clear to auscultation bilaterally.  Normal effort Cardio: S1-S2 is normal regular.  No S3-S4.  No rubs murmurs or bruit GI: Abdomen is soft.  Nontender nondistended.  Bowel sounds are present normal.  No masses organomegaly Extremities: No edema.  Full range of motion of lower extremities. Neurologic: Alert and oriented x3.  No focal neurological deficits.    Lab Results:  Data Reviewed: I have personally reviewed following labs and reports of the imaging studies  CBC: Recent Labs  Lab 01/25/23 1046 01/26/23 0401  WBC 7.3 9.5  HGB 10.4* 10.9*  HCT 33.4* 34.9*  MCV 93.6 95.1  PLT 172 154    Basic Metabolic Panel: Recent Labs  Lab 01/25/23 1046 01/26/23 0401  NA 140 133*  K 4.0 4.0  CL 107 100  CO2 24 25  GLUCOSE 130* 272*  BUN 21 15  CREATININE 1.34* 1.17*  CALCIUM 8.9 8.8*    GFR: Estimated Creatinine Clearance: 47.9 mL/min (A) (by C-G formula based on SCr of 1.17 mg/dL (H)).  Liver Function Tests: Recent Labs  Lab 01/25/23 1046 01/26/23 0401  AST 27 21  ALT 30 29  ALKPHOS 58 56  BILITOT 0.9 0.9  PROT 6.4* 6.8  ALBUMIN 3.1* 3.2*     Recent Labs  Lab 01/25/23 1046  LIPASE 33    Coagulation Profile: Recent Labs  Lab 01/26/23 0401  INR 1.1     HbA1C: Recent Labs    01/25/23 1043  HGBA1C 7.4*    CBG: Recent Labs  Lab 01/26/23 0753 01/26/23 1106 01/26/23 1800 01/26/23 2053 01/27/23 0811  GLUCAP 247* 184* 295* 309* 245*     Radiology Studies: VAS [place] LOWER EXTREMITY VENOUS (DVT)  Result Date: 01/26/2023  Lower Venous DVT Study Patient Name:  Karen Gardner  Date of Exam:   01/26/2023 Medical Rec #: [id number]         Accession #:    [id number] Date of Birth: 06-17-1955         Patient Gender: F Patient Age:   67 years Exam Location:  [facility] Procedure:      VAS [place] LOWER EXTREMITY VENOUS (DVT) Referring Phys: Osvaldo Shipper --------------------------------------------------------------------------------  Indications: Chest pain, Swelling, and Pain.  Comparison Study: No  TID WC   [MAR Hold] isosorbide mononitrate  60 mg Oral Daily   [MAR Hold] latanoprost  1 drop Both Eyes QHS   [MAR Hold] pantoprazole (PROTONIX) IV  40 mg Intravenous Q12H   [MAR Hold] ranolazine  1,000 mg Oral BID   Continuous: PRN:[MAR Hold]  acetaminophen **OR** [MAR Hold] acetaminophen, [MAR Hold] hydrALAZINE, [MAR Hold]  HYDROmorphone (DILAUDID) injection, [MAR Hold] lidocaine, [MAR Hold] ondansetron **OR** [MAR Hold] ondansetron (ZOFRAN) IV, [MAR Hold] oxyCODONE, [MAR Hold] traZODone  Antibiotics: Anti-infectives (From admission, onward)    None       Objective:  Vital Signs  Vitals:   01/26/23 2342 01/27/23 0448 01/27/23 0811 01/27/23 0954  BP: 138/71 (!) 157/80 (!) 155/74 131/77  Pulse: 85 81 83 78  Resp: 17  18 17   Temp: 100.1 F (37.8 C) 99.3 F (37.4 C) 98 F (36.7 C) 98.1 F (36.7 C)  TempSrc:  Oral  Temporal  SpO2: 100% 93% 94% 95%  Weight:      Height:       No intake or output data in the 24 hours ending 01/27/23 0959  Filed Weights   01/25/23 1317  Weight: 81.6 kg    General appearance: Awake alert.  In no distress Resp: Clear to auscultation bilaterally.  Normal effort Cardio: S1-S2 is normal regular.  No S3-S4.  No rubs murmurs or bruit GI: Abdomen is soft.  Nontender nondistended.  Bowel sounds are present normal.  No masses organomegaly Extremities: No edema.  Full range of motion of lower extremities. Neurologic: Alert and oriented x3.  No focal neurological deficits.    Lab Results:  Data Reviewed: I have personally reviewed following labs and reports of the imaging studies  CBC: Recent Labs  Lab 01/25/23 1046 01/26/23 0401  WBC 7.3 9.5  HGB 10.4* 10.9*  HCT 33.4* 34.9*  MCV 93.6 95.1  PLT 172 154    Basic Metabolic Panel: Recent Labs  Lab 01/25/23 1046 01/26/23 0401  NA 140 133*  K 4.0 4.0  CL 107 100  CO2 24 25  GLUCOSE 130* 272*  BUN 21 15  CREATININE 1.34* 1.17*  CALCIUM 8.9 8.8*    GFR: Estimated Creatinine Clearance: 47.9 mL/min (A) (by C-G formula based on SCr of 1.17 mg/dL (H)).  Liver Function Tests: Recent Labs  Lab 01/25/23 1046 01/26/23 0401  AST 27 21  ALT 30 29  ALKPHOS 58 56  BILITOT 0.9 0.9  PROT 6.4* 6.8  ALBUMIN 3.1* 3.2*     Recent Labs  Lab 01/25/23 1046  LIPASE 33    Coagulation Profile: Recent Labs  Lab 01/26/23 0401  INR 1.1     HbA1C: Recent Labs    01/25/23 1043  HGBA1C 7.4*    CBG: Recent Labs  Lab 01/26/23 0753 01/26/23 1106 01/26/23 1800 01/26/23 2053 01/27/23 0811  GLUCAP 247* 184* 295* 309* 245*     Radiology Studies: VAS [place] LOWER EXTREMITY VENOUS (DVT)  Result Date: 01/26/2023  Lower Venous DVT Study Patient Name:  Karen Gardner  Date of Exam:   01/26/2023 Medical Rec #: [id number]         Accession #:    [id number] Date of Birth: 06-17-1955         Patient Gender: F Patient Age:   67 years Exam Location:  [facility] Procedure:      VAS [place] LOWER EXTREMITY VENOUS (DVT) Referring Phys: Osvaldo Shipper --------------------------------------------------------------------------------  Indications: Chest pain, Swelling, and Pain.  Comparison Study: No  [facility] PROGRESS NOTE   Karen Gardner QIO:[id number] DOB: 12/09/1955 DOA: 01/25/2023  PCP: [name], MD  Brief History: 67 y.o. female with a past medical history of essential hypertension, diabetes mellitus type 2, coronary artery disease, GERD, hyperlipidemia who was in her usual state of health till the morning of admission when she woke up from sleep with chest pain located in the central part of the chest.  Troponins were negative.  EKG did not show any ischemic changes.  CT angiogram ruled out dissection and PE.  She was hospitalized for further management due to persistent pain.    Consultants: Gastroenterology.  Cardiology  Procedures: None yet    Subjective/Interval History: Patient still experiencing intermittent severe chest discomfort.  Positional to some extent.  But no particular position really helps the pain.  Leaning forward makes it worse.  Some pleuritic component also present.  Assessment/Plan:  Central chest pain Broad differential. Acute coronary syndrome was ruled out.  Do not suspect cardiac etiology since EKG did not show any ischemic changes plus the character of her pain is not suggestive of cardiac etiology. There is a component of pleurisy. CT angiogram was negative for dissection or PE. ?Pericarditis. No pericardial effusion noted on CT. CT scan did show a moderate hiatal hernia which could be responsible for her symptoms.  Gastroenterology has been consulted.  Plan is for EGD today. Seen by cardiology.  ESR mildly elevated.  CRP is noted to be elevated at 12.  Pericarditis is a possibility.  However symptoms are atypical for pericarditis as well.   Will wait and see what the EGD shows.  Continue with PPI for now. EGD showed Barrett's esophagus and mild gastritis. Unlikely to be responsible for her symptoms. Etiology still unclear. Will treat for possible pericarditis with colchicine and steroids. Cannot use NSAIDS due to  CKD.  Pedal edema Lower extremity Doppler studies negative for DVT.  Edema is stable.  History of coronary artery disease Followed by Dr. Herbie [place]. Her last cardiac catheterization was in November 2022: moderate to severe diffusely calcified coronary arteries with severe single-vessel disease involving a small caliber codominant RCA with multiple segments of 70-90% stenosis was noted.  It was felt that this would be best treated medically.  She is on aspirin beta-blocker statin prior to admission.  Also on Ranexa which will be continued for now.  Diabetes mellitus type 2 Monitor CBGs.  SSI.  Holding metformin.  Essential hypertension Blood pressure better controlled over the last 24 hours.  Continue with carvedilol amlodipine and nitrates.  Hydralazine as needed.    Hiatal hernia/status post gastric sleeve surgery  The gastric sleeve surgery was done in 2015 for weight loss purposes.  Hiatal hernia noted on CT scan which could be contributing to his symptoms. See above.  Normocytic anemia Hemoglobin close to baseline.  No evidence of overt bleeding.  Chronic kidney disease stage IIIa Renal function close to baseline.  Avoid nephrotoxic agents.  Recheck labs tomorrow.   DVT Prophylaxis: Lovenox Code Status: Full code Family Communication: Discussed with patient Disposition Plan: Home when improved.     Medications: Scheduled:  [MAR Hold] amLODipine  10 mg Oral Daily   [MAR Hold] carvedilol  6.25 mg Oral BID WC   [MAR Hold] enoxaparin (LOVENOX) injection  40 mg Subcutaneous Q24H   [MAR Hold] escitalopram  10 mg Oral QHS   [MAR Hold] insulin aspart  0-5 Units Subcutaneous QHS   [MAR Hold] insulin aspart  0-9 Units Subcutaneous  TID WC   [MAR Hold] isosorbide mononitrate  60 mg Oral Daily   [MAR Hold] latanoprost  1 drop Both Eyes QHS   [MAR Hold] pantoprazole (PROTONIX) IV  40 mg Intravenous Q12H   [MAR Hold] ranolazine  1,000 mg Oral BID   Continuous: PRN:[MAR Hold]  acetaminophen **OR** [MAR Hold] acetaminophen, [MAR Hold] hydrALAZINE, [MAR Hold]  HYDROmorphone (DILAUDID) injection, [MAR Hold] lidocaine, [MAR Hold] ondansetron **OR** [MAR Hold] ondansetron (ZOFRAN) IV, [MAR Hold] oxyCODONE, [MAR Hold] traZODone  Antibiotics: Anti-infectives (From admission, onward)    None       Objective:  Vital Signs  Vitals:   01/26/23 2342 01/27/23 0448 01/27/23 0811 01/27/23 0954  BP: 138/71 (!) 157/80 (!) 155/74 131/77  Pulse: 85 81 83 78  Resp: 17  18 17   Temp: 100.1 F (37.8 C) 99.3 F (37.4 C) 98 F (36.7 C) 98.1 F (36.7 C)  TempSrc:  Oral  Temporal  SpO2: 100% 93% 94% 95%  Weight:      Height:       No intake or output data in the 24 hours ending 01/27/23 0959  Filed Weights   01/25/23 1317  Weight: 81.6 kg    General appearance: Awake alert.  In no distress Resp: Clear to auscultation bilaterally.  Normal effort Cardio: S1-S2 is normal regular.  No S3-S4.  No rubs murmurs or bruit GI: Abdomen is soft.  Nontender nondistended.  Bowel sounds are present normal.  No masses organomegaly Extremities: No edema.  Full range of motion of lower extremities. Neurologic: Alert and oriented x3.  No focal neurological deficits.    Lab Results:  Data Reviewed: I have personally reviewed following labs and reports of the imaging studies  CBC: Recent Labs  Lab 01/25/23 1046 01/26/23 0401  WBC 7.3 9.5  HGB 10.4* 10.9*  HCT 33.4* 34.9*  MCV 93.6 95.1  PLT 172 154    Basic Metabolic Panel: Recent Labs  Lab 01/25/23 1046 01/26/23 0401  NA 140 133*  K 4.0 4.0  CL 107 100  CO2 24 25  GLUCOSE 130* 272*  BUN 21 15  CREATININE 1.34* 1.17*  CALCIUM 8.9 8.8*    GFR: Estimated Creatinine Clearance: 47.9 mL/min (A) (by C-G formula based on SCr of 1.17 mg/dL (H)).  Liver Function Tests: Recent Labs  Lab 01/25/23 1046 01/26/23 0401  AST 27 21  ALT 30 29  ALKPHOS 58 56  BILITOT 0.9 0.9  PROT 6.4* 6.8  ALBUMIN 3.1* 3.2*     Recent Labs  Lab 01/25/23 1046  LIPASE 33    Coagulation Profile: Recent Labs  Lab 01/26/23 0401  INR 1.1     HbA1C: Recent Labs    01/25/23 1043  HGBA1C 7.4*    CBG: Recent Labs  Lab 01/26/23 0753 01/26/23 1106 01/26/23 1800 01/26/23 2053 01/27/23 0811  GLUCAP 247* 184* 295* 309* 245*     Radiology Studies: VAS [place] LOWER EXTREMITY VENOUS (DVT)  Result Date: 01/26/2023  Lower Venous DVT Study Patient Name:  Karen Gardner  Date of Exam:   01/26/2023 Medical Rec #: [id number]         Accession #:    [id number] Date of Birth: 06-17-1955         Patient Gender: F Patient Age:   67 years Exam Location:  [facility] Procedure:      VAS [place] LOWER EXTREMITY VENOUS (DVT) Referring Phys: Osvaldo Shipper --------------------------------------------------------------------------------  Indications: Chest pain, Swelling, and Pain.  Comparison Study: No

## 2023-01-28 ENCOUNTER — Inpatient Hospital Stay (HOSPITAL_COMMUNITY): Payer: Medicare Other

## 2023-01-28 DIAGNOSIS — I10 Essential (primary) hypertension: Secondary | ICD-10-CM | POA: Diagnosis not present

## 2023-01-28 DIAGNOSIS — R079 Chest pain, unspecified: Secondary | ICD-10-CM | POA: Diagnosis not present

## 2023-01-28 DIAGNOSIS — E119 Type 2 diabetes mellitus without complications: Secondary | ICD-10-CM | POA: Diagnosis not present

## 2023-01-28 DIAGNOSIS — I25119 Atherosclerotic heart disease of native coronary artery with unspecified angina pectoris: Secondary | ICD-10-CM | POA: Diagnosis not present

## 2023-01-28 DIAGNOSIS — I300 Acute nonspecific idiopathic pericarditis: Secondary | ICD-10-CM | POA: Diagnosis not present

## 2023-01-28 LAB — ECHOCARDIOGRAM LIMITED
Height: 65 in
Weight: 2880 [oz_av]

## 2023-01-28 LAB — COMPREHENSIVE METABOLIC PANEL
ALT: 22 U/L (ref 0–44)
AST: 17 U/L (ref 15–41)
Albumin: 2.7 g/dL — ABNORMAL LOW (ref 3.5–5.0)
Alkaline Phosphatase: 62 U/L (ref 38–126)
Anion gap: 11 (ref 5–15)
BUN: 29 mg/dL — ABNORMAL HIGH (ref 8–23)
CO2: 23 mmol/L (ref 22–32)
Calcium: 8.7 mg/dL — ABNORMAL LOW (ref 8.9–10.3)
Chloride: 99 mmol/L (ref 98–111)
Creatinine, Ser: 1.54 mg/dL — ABNORMAL HIGH (ref 0.44–1.00)
GFR, Estimated: 36 mL/min — ABNORMAL LOW (ref >60)
Glucose, Bld: 381 mg/dL — ABNORMAL HIGH (ref 70–99)
Potassium: 3.9 mmol/L (ref 3.5–5.1)
Sodium: 133 mmol/L — ABNORMAL LOW (ref 135–145)
Total Bilirubin: 0.9 mg/dL (ref 0.3–1.2)
Total Protein: 6.7 g/dL (ref 6.5–8.1)

## 2023-01-28 LAB — GLUCOSE, CAPILLARY
Glucose-Capillary: 318 mg/dL — ABNORMAL HIGH (ref 70–99)
Glucose-Capillary: 355 mg/dL — ABNORMAL HIGH (ref 70–99)
Glucose-Capillary: 295 mg/dL — ABNORMAL HIGH (ref 70–99)
Glucose-Capillary: 405 mg/dL — ABNORMAL HIGH (ref 70–99)
Glucose-Capillary: 389 mg/dL — ABNORMAL HIGH (ref 70–99)

## 2023-01-28 LAB — CBC
HCT: 32.4 % — ABNORMAL LOW (ref 36.0–46.0)
Hemoglobin: 10.6 g/dL — ABNORMAL LOW (ref 12.0–15.0)
MCH: 29.9 pg (ref 26.0–34.0)
MCHC: 32.7 g/dL (ref 30.0–36.0)
MCV: 91.5 fL (ref 80.0–100.0)
Platelets: 159 10*3/uL (ref 150–400)
RBC: 3.54 MIL/uL — ABNORMAL LOW (ref 3.87–5.11)
RDW: 13 % (ref 11.5–15.5)
WBC: 5.8 10*3/uL (ref 4.0–10.5)
nRBC: 0 % (ref 0.0–0.2)

## 2023-01-28 LAB — GLUCOSE, RANDOM: Glucose, Bld: 407 mg/dL — ABNORMAL HIGH (ref 70–99)

## 2023-01-28 LAB — MAGNESIUM: Magnesium: 2.1 mg/dL (ref 1.7–2.4)

## 2023-01-28 MED ORDER — INSULIN GLARGINE-YFGN 100 UNIT/ML ~~LOC~~ SOLN
10.0000 [IU] | Freq: Every day | SUBCUTANEOUS | Status: DC
  Administered 2023-01-28 – 2023-01-29 (×2): 10 [IU] via SUBCUTANEOUS
  Filled 2023-01-28 (×4): qty 0.1

## 2023-01-28 MED ORDER — INSULIN ASPART 100 UNIT/ML IJ SOLN
10.0000 [IU] | Freq: Once | INTRAMUSCULAR | Status: AC
  Administered 2023-01-28: 10 [IU] via SUBCUTANEOUS

## 2023-01-28 MED ORDER — PANTOPRAZOLE SODIUM 40 MG PO TBEC
40.0000 mg | DELAYED_RELEASE_TABLET | Freq: Two times a day (BID) | ORAL | Status: DC
  Administered 2023-01-28 – 2023-01-30 (×4): 40 mg via ORAL
  Filled 2023-01-28 (×4): qty 1

## 2023-01-28 MED ORDER — ATORVASTATIN CALCIUM 80 MG PO TABS
80.0000 mg | ORAL_TABLET | Freq: Every day | ORAL | Status: DC
  Administered 2023-01-28 – 2023-01-29 (×2): 80 mg via ORAL
  Filled 2023-01-28 (×2): qty 1

## 2023-01-28 MED ORDER — INSULIN ASPART 100 UNIT/ML IJ SOLN
0.0000 [IU] | Freq: Every day | INTRAMUSCULAR | Status: DC
  Administered 2023-01-28: 5 [IU] via SUBCUTANEOUS

## 2023-01-28 MED ORDER — INSULIN ASPART 100 UNIT/ML IJ SOLN
0.0000 [IU] | Freq: Three times a day (TID) | INTRAMUSCULAR | Status: DC
  Administered 2023-01-28: 20 [IU] via SUBCUTANEOUS
  Administered 2023-01-28: 11 [IU] via SUBCUTANEOUS
  Administered 2023-01-29: 4 [IU] via SUBCUTANEOUS
  Administered 2023-01-29: 7 [IU] via SUBCUTANEOUS
  Administered 2023-01-29 – 2023-01-30 (×2): 11 [IU] via SUBCUTANEOUS
  Administered 2023-01-30: 4 [IU] via SUBCUTANEOUS

## 2023-01-28 NOTE — Plan of Care (Signed)
  Problem: Coping: Goal: Ability to adjust to condition or change in health will improve Outcome: Progressing   Problem: Metabolic: Goal: Ability to maintain appropriate glucose levels will improve Outcome: Progressing   Problem: Skin Integrity: Goal: Risk for impaired skin integrity will decrease Outcome: Progressing

## 2023-01-28 NOTE — Plan of Care (Signed)

## 2023-01-28 NOTE — Progress Notes (Addendum)
Cardiology Progress Note  Patient ID: Karen Gardner MRN: [id number] DOB: 1956/02/03 Date of Encounter: 01/28/2023 Primary Cardiologist: Bryan Lemma, MD  Subjective   Chief Complaint: Chest pain  HPI: EGD with mild gastritis.  No definitive GI pathology to explain symptoms.  Elevated inflammatory markers.  Working diagnosis is acute pericarditis.  ROS:  All other ROS reviewed and negative. Pertinent positives noted in the HPI.     Vital Signs   Vitals:   01/27/23 1613 01/27/23 2159 01/28/23 0505 01/28/23 0746  BP: 121/64 128/66 129/72 (!) 155/65  Pulse: 84 82 77 71  Resp: 20 18 18 18   Temp: 97.8 F (36.6 C) 98.6 F (37 C) 98 F (36.7 C) 98 F (36.7 C)  TempSrc:  Oral Oral   SpO2: 96% 96% 97% 98%  Weight:      Height:        Intake/Output Summary (Last 24 hours) at 01/28/2023 0906 Last data filed at 01/27/2023 1345 Gross per 24 hour  Intake 450 ml  Output 0 ml  Net 450 ml      01/25/2023    1:17 PM 11/14/2022    9:57 AM 10/12/2022   10:07 AM  Last 3 Weights  Weight (lbs) 180 lb 176 lb 12.8 oz 176 lb 3.2 oz  Weight (kg) 81.647 kg 80.196 kg 79.924 kg       ECG  The most recent ECG shows normal sinus rhythm heart rate 84, no acute ischemic changes, which I personally reviewed.   Physical Exam   Vitals:   01/27/23 1613 01/27/23 2159 01/28/23 0505 01/28/23 0746  BP: 121/64 128/66 129/72 (!) 155/65  Pulse: 84 82 77 71  Resp: 20 18 18 18   Temp: 97.8 F (36.6 C) 98.6 F (37 C) 98 F (36.7 C) 98 F (36.7 C)  TempSrc:  Oral Oral   SpO2: 96% 96% 97% 98%  Weight:      Height:        Intake/Output Summary (Last 24 hours) at 01/28/2023 0906 Last data filed at 01/27/2023 1345 Gross per 24 hour  Intake 450 ml  Output 0 ml  Net 450 ml       01/25/2023    1:17 PM 11/14/2022    9:57 AM 10/12/2022   10:07 AM  Last 3 Weights  Weight (lbs) 180 lb 176 lb 12.8 oz 176 lb 3.2 oz  Weight (kg) 81.647 kg 80.196 kg 79.924 kg    Body mass index is 29.95 kg/m.   General: Well nourished, well developed, in no acute distress Head: Atraumatic, normal size  Eyes: PEERLA, EOMI  Neck: Supple, no JVD Endocrine: No thryomegaly Cardiac: Normal S1, S2; RRR; no murmurs, rubs, or gallops Lungs: Clear to auscultation bilaterally, no wheezing, rhonchi or rales  Abd: Soft, nontender, no hepatomegaly  Ext: No edema, pulses 2+ Musculoskeletal: No deformities, BUE and BLE strength normal and equal Skin: Warm and dry, no rashes   Neuro: Alert and oriented to person, place, time, and situation, CNII-XII grossly intact, no focal deficits  Psych: Normal mood and affect   Cardiac Studies  TTE 07/17/2022  1. Left ventricular ejection fraction, by estimation, is 55 to 60%. The  left ventricle has normal function. The left ventricle has no regional  wall motion abnormalities. There is mild concentric left ventricular  hypertrophy. Left ventricular diastolic  parameters are indeterminate.   2. Right ventricular systolic function is normal. The right ventricular  size is normal. There is normal pulmonary artery systolic pressure.  3. The mitral valve is normal in structure. Mild mitral valve  regurgitation. No evidence of mitral stenosis.   4. The aortic valve is normal in structure. Aortic valve regurgitation is  not visualized. Aortic valve sclerosis/calcification is present, without  any evidence of aortic stenosis.   5. The inferior vena cava is normal in size with greater than 50%  respiratory variability, suggesting right atrial pressure of 3 mmHg.   LHC 02/02/2021   Ramus lesion is 50% stenosed. Prox LAD to Mid LAD lesion is 20% stenosed.   Heavily calcified, tortuous "codominant" RCA: Prox RCA lesion is 70% stenosed.  Mid RCA to Dist RCA lesion is 90% stenosed.  Dist RCA lesion is 90% stenosed. ->  Not PCI target   Mid Cx to Dist Cx lesion is 30% stenosed with 35% stenosed side branch in LPAV.  (Heavily calcified)    ------------------------------------------   The left ventricular systolic function is normal. The left ventricular ejection fraction is 55-65% by visual estimate.   LV end diastolic pressure is mildly elevated.   There is no aortic valve stenosis.  Patient Profile  Karen Gardner is a 67 y.o. female with CAD, diabetes, CKD stage IIIa, hypertension admitted on 01/26/2023 with acute chest pain.   Assessment & Plan   # Acute chest pain # Elevated CRP # Concerns for acute pericarditis -Admitted with acute sharp chest discomfort.  Somewhat positional but not classic for pericarditis.  Did report dysphagia.  She did undergo EGD which showed mild gastritis but this does not appear to readily explain her symptoms. -Troponin is negative.  EKG is nonischemic.  She does have a history of obstructive CAD in the RCA that is managed medically but her symptoms are inconsistent with angina in my opinion. -Inflammatory markers are elevated.  I think this could be pericarditis.  Start aspirin 650 mg 3 times daily with plan to taper this after 7 days to 650 mg twice daily followed by 7 days of 650 mg daily.  We can trend her inflammatory markers as an outpatient.  I do not believe she needs steroids at this time.  I think we can treat this with aspirin.  Would like to avoid ibuprofen or NSAIDs given kidney disease and CAD. -Continue colchicine 0.6 mg twice daily. -Continue Protonix for GI protection. -We will repeat her EKG today. -Limited echo.  # CAD -Obstructive disease in the RCA that is managed medically.  Troponins negative.  EKG is nonischemic.  Repeat EKG. -Changing to high-dose aspirin to treat pericarditis.  Continue home carvedilol amlodipine, Imdur and Ranexa. -Given inflammatory markers which are elevated I believe working diagnosis is acute pericarditis. -Restart home statin.  # Gastritis -Mild.  No obvious bleeding.  Continue Protonix 40 mg twice daily per GI recommendation.  Also will  serve for GI protection for treatment of pericarditis.  # CKD stage IIIa -Stable.  # DM -Per hospital medicine.      For questions or updates, please contact [facility] Please consult [email or web address] for contact info under        Signed, Gerri Spore T. Flora Lipps, MD, [facility] [place]  [facility]  01/28/2023 9:06 AM

## 2023-01-28 NOTE — Progress Notes (Signed)
[facility] Progress Note  SUBJECTIVE:   Interval history: Karen Gardner was seen and evaluated today at bedside. Mother and sister at bedside. Denied odynophagia. Chest pain 9/10 intensity, was 20/10 intensity on presentation per patient. No nausea or vomiting. No bowel movement since hospitalization, does not use laxatives prior to presentation. No shortness of breath.   Past Medical History:  Diagnosis Date   Allergic rhinitis    Anxiety    Arthritis    Asthma    childhood   Chronic low back pain    CKD (chronic kidney disease), stage III (HCC)    However, most recent creatinine 1.5.   Coronary artery disease involving native heart with other form of angina pectoris, unspecified vessel or lesion type [facility]) 11/16/2020   11/16/2020: CORONARY CA++ SCORE: Agatston Score 2245.  LAD 1093, LCx 959, RCA 193 (99th percentile) -> COR CTA (Agatston 2437) -moderate to severe multivessel CAD with significant blooming in all 3 epicardial vessels.  CAD RADS 3-4. FFRCT suggests mid RCA occlusion, => CARDIAC CATH 02/02/21: RCA: prox 70%, Mid-distal 90% & distal 90% (small caliber, Not viable PCI target; Prox RI 50%.   Depression    With anxiety   GERD (gastroesophageal reflux disease)    Headache    sinus headaches    Hypercholesterolemia    Hypertension    Iron deficiency anemia 05/26/2015   Has required transfusions-followed by Dr. Myna Hidalgo   Iron malabsorption 05/26/2015   Menopause    Numbness in both hands    mostly at night   Obesity    Osteoarthritis of both knees    Status post right TKA (and ankle) with plans for left TKA   Type 2 diabetes mellitus without complication, with long-term current use of insulin (HCC)    Type 2 on insulin pump   Vasovagal syncope 2018   Negative work-up   Vitamin D deficiency    Past Surgical History:  Procedure Laterality Date   COLONOSCOPY     EYE SURGERY Bilateral    [facility]    HERNIA REPAIR     umb hernia as child   KNEE ARTHROSCOPY   02/12/2012   Procedure: ARTHROSCOPY KNEE;  Surgeon: Nestor Lewandowsky, MD;  Location:  SURGERY CENTER;  Service: Orthopedics;  Laterality: Right;  Partial Lateral Meniscectomy, Debridement chondromalacia   LAPAROSCOPIC GASTRIC SLEEVE RESECTION N/A 03/14/2016   Procedure: LAPAROSCOPIC GASTRIC SLEEVE RESECTION, WITH REPAIR OF HIATAL HERNIA REPAI, AND UPPER ENDO;  Surgeon: Luretha Murphy, MD;  Location: [facility];  Service: General;  Laterality: N/A;   LEFT HEART CATH AND CORONARY ANGIOGRAPHY N/A 02/02/2021   Procedure: LEFT HEART CATH AND CORONARY ANGIOGRAPHY;  Surgeon: Marykay Lex, MD;  Location: [facility];; Heavily calcified tortuous codominant RCA with proximal 70% stenosis.  Mid to distal RCA 90% stenosis.  Distal RCA 90%.  Not a PCI target.  50% RI, proximal to mid LAD 20%.  Mid to distal LCx 30% with 30% in LP AV-heavily calcified.  EF 55-65%.  Mildly elevated LVEDP.   ORIF ANKLE FRACTURE Right 02/11/2014   Procedure: OPEN REDUCTION INTERNAL FIXATION (ORIF) RIGHT ANKLE FRACTURE;  Surgeon: Nestor Lewandowsky, MD;  Location: [facility] OR;  Service: Orthopedics;  Laterality: Right;   TOTAL KNEE ARTHROPLASTY Right 08/05/2014   Procedure: TOTAL KNEE ARTHROPLASTY;  Surgeon: Gean Birchwood, MD;  Location: [facility] OR;  Service: Orthopedics;  Laterality: Right;   TRANSTHORACIC ECHOCARDIOGRAM  05/2016   EF 60 to 65%.  Severe basal septal LVH with  mild concentric LVH.  No R WMA.  GR 1 DD.  Indeterminate filling pressures. Minimal valve disease.   TRANSTHORACIC ECHOCARDIOGRAM  03/29/2021   EF 55 to 60%.  No R WMA-normal strain pattern.  GR 1 DD.  Normal RV with RV P mild aortic sclerosis but no stenosis no AI.  Mildly elevated RAP.   TRIGGER FINGER RELEASE Right 10/14/2018   Procedure: RELEASE TRIGGER FINGER/A-1 PULLEY;  Surgeon: Betha Loa, MD;  Location: [facility];  Service: Orthopedics;  Laterality: Right;   UPPER GI ENDOSCOPY     Current Facility-Administered Medications  Medication  Dose Route Frequency Provider Last Rate Last Admin   acetaminophen (TYLENOL) tablet 650 mg  650 mg Oral Q6H PRN Lynann Bologna, DO       Or   acetaminophen (TYLENOL) suppository 650 mg  650 mg Rectal Q6H PRN Lynann Bologna, DO       amLODipine (NORVASC) tablet 10 mg  10 mg Oral Daily Liliane Shi H, DO   10 mg at 01/28/23 0272   aspirin tablet 650 mg  650 mg Oral TID Sande Rives, MD   650 mg at 01/28/23 5366   Followed by   Melene Muller ON 02/03/2023] aspirin tablet 650 mg  650 mg Oral BID Sande Rives, MD       atorvastatin (LIPITOR) tablet 80 mg  80 mg Oral QHS Sande Rives, MD       carvedilol (COREG) tablet 6.25 mg  6.25 mg Oral BID WC Liliane Shi H, DO   6.25 mg at 01/28/23 4403   colchicine tablet 0.6 mg  0.6 mg Oral BID Osvaldo Shipper, MD   0.6 mg at 01/28/23 0842   enoxaparin (LOVENOX) injection 40 mg  40 mg Subcutaneous Q24H Liliane Shi H, DO   40 mg at 01/27/23 1725   escitalopram (LEXAPRO) tablet 10 mg  10 mg Oral QHS Liliane Shi H, DO   10 mg at 01/27/23 2102   hydrALAZINE (APRESOLINE) injection 10 mg  10 mg Intravenous Q6H PRN Liliane Shi H, DO       HYDROmorphone (DILAUDID) injection 0.5 mg  0.5 mg Intravenous Q4H PRN Liliane Shi H, DO   0.5 mg at 01/28/23 0848   insulin aspart (novoLOG) injection 0-20 Units  0-20 Units Subcutaneous TID WC Osvaldo Shipper, MD       insulin aspart (novoLOG) injection 0-5 Units  0-5 Units Subcutaneous QHS Osvaldo Shipper, MD       isosorbide mononitrate (IMDUR) 24 hr tablet 60 mg  60 mg Oral Daily Liliane Shi H, DO   60 mg at 01/28/23 0821   latanoprost (XALATAN) 0.005 % ophthalmic solution 1 drop  1 drop Both Eyes QHS Liliane Shi H, DO   1 drop at 01/27/23 2137   lidocaine (XYLOCAINE) 2 % viscous mouth solution 15 mL  15 mL Mouth/Throat Q4H PRN Liliane Shi H, DO       ondansetron (ZOFRAN) tablet 4 mg  4 mg Oral Q6H PRN Lynann Bologna, DO       Or   ondansetron [facility])  injection 4 mg  4 mg Intravenous Q6H PRN Liliane Shi H, DO       oxyCODONE (Oxy IR/ROXICODONE) immediate release tablet 5 mg  5 mg Oral Q4H PRN Liliane Shi H, DO   5 mg at 01/27/23 2105   pantoprazole (PROTONIX) EC tablet 40 mg  40 mg Oral BID AC Osvaldo Shipper, MD       ranolazine (  RANEXA) 12 hr tablet 1,000 mg  1,000 mg Oral BID Lynann Bologna, DO   1,000 mg at 01/28/23 2130   traZODone (DESYREL) tablet 25 mg  25 mg Oral QHS PRN Lynann Bologna, DO       Allergies as of 01/25/2023 - Review Complete 01/25/2023  Allergen Reaction Noted   Codeine Shortness Of Breath 05/24/2022   Lactose intolerance (gi) Diarrhea and Other (See Comments) 12/29/2013   Oatmeal Other (See Comments) 12/29/2013   Review of Systems:  Review of Systems  Respiratory:  Negative for shortness of breath.   Cardiovascular:  Positive for chest pain.  Gastrointestinal:  Negative for abdominal pain, nausea and vomiting.    OBJECTIVE:   Temp:  [97.8 F (36.6 C)-98.6 F (37 C)] 98 F (36.7 C) (10/27 0746) Pulse Rate:  [71-84] 71 (10/27 0746) Resp:  [18-20] 18 (10/27 0746) BP: (121-155)/(64-72) 155/65 (10/27 0746) SpO2:  [96 %-98 %] 98 % (10/27 0746)   Physical Exam Constitutional:      General: She is not in acute distress.    Appearance: She is not ill-appearing, toxic-appearing or diaphoretic.  Cardiovascular:     Rate and Rhythm: Normal rate and regular rhythm.  Pulmonary:     Effort: No respiratory distress.     Breath sounds: Normal breath sounds.  Abdominal:     General: Bowel sounds are normal. There is no distension.     Palpations: Abdomen is soft.     Tenderness: There is no abdominal tenderness. There is no guarding.  Neurological:     Mental Status: She is alert.     Labs: Recent Labs    01/26/23 0401 01/28/23 0550  WBC 9.5 5.8  HGB 10.9* 10.6*  HCT 34.9* 32.4*  PLT 154 159   BMET Recent Labs    01/26/23 0401 01/28/23 0550  NA 133* 133*  K 4.0 3.9  CL 100 99   CO2 25 23  GLUCOSE 272* 381*  BUN 15 29*  CREATININE 1.17* 1.54*  CALCIUM 8.8* 8.7*   LFT Recent Labs    01/28/23 0550  PROT 6.7  ALBUMIN 2.7*  AST 17  ALT 22  ALKPHOS 62  BILITOT 0.9   PT/INR Recent Labs    01/26/23 0401  LABPROT 14.5  INR 1.1   Diagnostic imaging: VAS [place] LOWER EXTREMITY VENOUS (DVT)  Result Date: 01/26/2023  Lower Venous DVT Study Patient Name:  BRENDALEE BINION  Date of Exam:   01/26/2023 Medical Rec #: [id number]         Accession #:    [id number] Date of Birth: 07-16-1955         Patient Gender: F Patient Age:   67 years Exam Location:  [facility] Procedure:      VAS [place] LOWER EXTREMITY VENOUS (DVT) Referring Phys: Osvaldo Shipper --------------------------------------------------------------------------------  Indications: Chest pain, Swelling, and Pain.  Comparison Study: No prior Performing Technologist: Fernande Bras  Examination Guidelines: A complete evaluation includes B-mode imaging, spectral Doppler, color Doppler, and power Doppler as needed of all accessible portions of each vessel. Bilateral testing is considered an integral part of a complete examination. Limited examinations for reoccurring indications may be performed as noted. The reflux portion of the exam is performed with the patient in reverse Trendelenburg.  +---------+---------------+---------+-----------+----------+--------------+ RIGHT    CompressibilityPhasicitySpontaneityPropertiesThrombus Aging +---------+---------------+---------+-----------+----------+--------------+ CFV      Full           Yes      Yes                                 +---------+---------------+---------+-----------+----------+--------------+  SFJ      Full                                                        +---------+---------------+---------+-----------+----------+--------------+ FV Prox  Full                                                         +---------+---------------+---------+-----------+----------+--------------+ FV Mid   Full                                                        +---------+---------------+---------+-----------+----------+--------------+ FV DistalFull                                                        +---------+---------------+---------+-----------+----------+--------------+ PFV      Full                                                        +---------+---------------+---------+-----------+----------+--------------+ POP      Full           Yes      Yes                                 +---------+---------------+---------+-----------+----------+--------------+ PTV      Full                                                        +---------+---------------+---------+-----------+----------+--------------+ PERO     Full                                                        +---------+---------------+---------+-----------+----------+--------------+   +---------+---------------+---------+-----------+----------+--------------+ LEFT     CompressibilityPhasicitySpontaneityPropertiesThrombus Aging +---------+---------------+---------+-----------+----------+--------------+ CFV      Full           Yes      Yes                                 +---------+---------------+---------+-----------+----------+--------------+ SFJ      Full                                                        +---------+---------------+---------+-----------+----------+--------------+  FV Prox  Full                                                        +---------+---------------+---------+-----------+----------+--------------+ FV Mid   Full                                                        +---------+---------------+---------+-----------+----------+--------------+ FV DistalFull                                                         +---------+---------------+---------+-----------+----------+--------------+ PFV      Full                                                        +---------+---------------+---------+-----------+----------+--------------+ POP      Full           Yes      Yes                                 +---------+---------------+---------+-----------+----------+--------------+ PTV      Full                                                        +---------+---------------+---------+-----------+----------+--------------+ PERO     Full                                                        +---------+---------------+---------+-----------+----------+--------------+     Summary: BILATERAL: - No evidence of deep vein thrombosis seen in the lower extremities, bilaterally. -No evidence of popliteal cyst, bilaterally.   *See table(s) above for measurements and observations. Electronically signed by Gerarda Fraction on 01/26/2023 at 5:02:09 PM.    Final     IMPRESSION: Atypical chest pain, concern for acute pericarditis given elevated inflammatory markers and EGD not suggestive of symptomatology Unspecified gastritis, gastric polyp, rule out Barrett's esophagus   -S/p EGD 01/27/23 with biopsies pending Gastric sleeve 2017 Coronary artery disease Hypertension Type 2 diabetes mellitus  PLAN: -Do not suspect primary GI etiology of presenting chest discomfort -I personally discussed EGD findings with patient and her family at bedside today -EGD pathology specimens pending, these can be followed up in the outpatient setting -Ok to continue PPI therapy, can use sucralfate as needed (doubt providing significant clinical benefit) -Recommend outpatient follow up with me to discuss dysphagia further, information placed in chart for discharge  -[facility] will sign off and be available  as needed for questions    LOS: 2 days   Liliane Shi, [facility]

## 2023-01-28 NOTE — Significant Event (Signed)
[facility] floor coverage note: Bedtime CBG 405 per RN.  RN giving 5u novolog to start.  Pts BGLs consistently high over past 48h  CBG (last 3)  Recent Labs    01/28/23 1142 01/28/23 1647 01/28/23 2122  GLUCAP 355* 295* 405*   Adding Lantus 10u at bedtime.  RN to call with CBG in 1h after novolog (probably needs more than the 5u).

## 2023-01-28 NOTE — Progress Notes (Signed)
Echocardiogram 2D Echocardiogram has been performed.  Warren Lacy Alyne Martinson RDCS 01/28/2023, 3:56 PM

## 2023-01-28 NOTE — Progress Notes (Signed)
[facility] PROGRESS NOTE   Karen Gardner ZOX:[id number] DOB: Dec 15, 1955 DOA: 01/25/2023  PCP: [name], MD  Brief History: 67 y.o. female with a past medical history of essential hypertension, diabetes mellitus type 2, coronary artery disease, GERD, hyperlipidemia who was in her usual state of health till the morning of admission when she woke up from sleep with chest pain located in the central part of the chest.  Troponins were negative.  EKG did not show any ischemic changes.  CT angiogram ruled out dissection and PE.  She was hospitalized for further management due to persistent pain.    Consultants: Gastroenterology.  Cardiology  Procedures: EGD    Subjective/Interval History: Patient mentions that she was feeling better most of the night but earlier this morning she again developed chest pain similar to before.  No other complaints offered.  .  Assessment/Plan:  Chest pain likely due to acute pericarditis Differential was broad to begin with.  Acute coronary syndrome was ruled out.  EKG EKG was nonischemic.  Troponins were normal.  CTA did not show any dissection or PE.  There was a pleuritic component to her pain so pericarditis was possibility.  However her symptoms were atypical for pericarditis as well.  She was complaining of dysphagia and was noted to have moderate hiatal hernia on CT.  Seen by gastroenterology underwent EGD which did not show any significant findings except for possible Barrett's esophagus. In view of this after discussions with cardiology patient was started on colchicine.  Cannot use NSAIDs due to CKD.  Was started on steroids. Patient was educated about the pericarditis and the need for colchicine. Patient has been started on full dose aspirin by cardiology and they have discontinued the prednisone for now. Limited echo to be done.  Pedal edema Lower extremity Doppler studies negative for DVT.  Edema is stable.  History of  coronary artery disease Followed by Dr. Herbie [place]. Her last cardiac catheterization was in November 2022: moderate to severe diffusely calcified coronary arteries with severe single-vessel disease involving a small caliber codominant RCA with multiple segments of 70-90% stenosis was noted.  It was felt that this would be best treated medically.  She is on aspirin beta-blocker statin prior to admission.  Also on Ranexa which will be continued for now.  Chronic kidney disease stage IIIa Renal function close to baseline.  Avoid nephrotoxic agents.   Increasing creatinine level noted today.  Recheck labs tomorrow.  Diabetes mellitus type 2 Monitor CBGs.  SSI.  Holding metformin.  She was also on Humalog 3 times a day as well as Trulicity once a week prior to admission. CBGs are poorly controlled likely due to prednisone that she received yesterday.  Should improve since prednisone has not been discontinued.  Will change her to resistant SSI for now.  May need basal insulin but will determine depending on glucose trends over the next 24 hours.  Essential hypertension Blood pressure better controlled over the last 24 hours.  Continue with carvedilol amlodipine and nitrates.  Hydralazine as needed.    Hiatal hernia/status post gastric sleeve surgery  The gastric sleeve surgery was done in 2015 for weight loss purposes.  Hiatal hernia noted on CT scan.  Suspected Barrett's esophagus GI to follow-up in the outpatient setting.  Normocytic anemia Hemoglobin close to baseline.  No evidence of overt bleeding.   DVT Prophylaxis: Lovenox Code Status: Full code Family Communication: Discussed with patient Disposition Plan: Home when improved.     Medications:  DVT)  Result Date: 01/26/2023  Lower Venous DVT Study Patient Name:  Karen Gardner  Date of Exam:   01/26/2023 Medical Rec #: [id number]         Accession #:    [id number] Date of Birth: [birth year]         Patient Gender: F Patient Age:   2 years Exam Location:  [facility] Procedure:      VAS [place] LOWER EXTREMITY VENOUS (DVT) Referring Phys: Osvaldo Shipper --------------------------------------------------------------------------------  Indications: Chest pain, Swelling, and Pain.  Comparison Study: No prior Performing Technologist: Fernande Bras  Examination Guidelines: A complete evaluation includes B-mode imaging, spectral Doppler, color Doppler, and power Doppler as needed of all accessible portions of each vessel. Bilateral testing is considered an integral part of a complete examination. Limited examinations for reoccurring indications may be performed as noted. The reflux portion of the exam is performed with the patient in reverse Trendelenburg.  +---------+---------------+---------+-----------+----------+--------------+ RIGHT    CompressibilityPhasicitySpontaneityPropertiesThrombus Aging +---------+---------------+---------+-----------+----------+--------------+ CFV       Full           Yes      Yes                                 +---------+---------------+---------+-----------+----------+--------------+ SFJ      Full                                                        +---------+---------------+---------+-----------+----------+--------------+ FV Prox  Full                                                        +---------+---------------+---------+-----------+----------+--------------+ FV Mid   Full                                                        +---------+---------------+---------+-----------+----------+--------------+ FV DistalFull                                                        +---------+---------------+---------+-----------+----------+--------------+ PFV      Full                                                        +---------+---------------+---------+-----------+----------+--------------+ POP      Full           Yes      Yes                                 +---------+---------------+---------+-----------+----------+--------------+ PTV      Full                                                        +---------+---------------+---------+-----------+----------+--------------+  DVT)  Result Date: 01/26/2023  Lower Venous DVT Study Patient Name:  Karen Gardner  Date of Exam:   01/26/2023 Medical Rec #: [id number]         Accession #:    [id number] Date of Birth: [birth year]         Patient Gender: F Patient Age:   2 years Exam Location:  [facility] Procedure:      VAS [place] LOWER EXTREMITY VENOUS (DVT) Referring Phys: Osvaldo Shipper --------------------------------------------------------------------------------  Indications: Chest pain, Swelling, and Pain.  Comparison Study: No prior Performing Technologist: Fernande Bras  Examination Guidelines: A complete evaluation includes B-mode imaging, spectral Doppler, color Doppler, and power Doppler as needed of all accessible portions of each vessel. Bilateral testing is considered an integral part of a complete examination. Limited examinations for reoccurring indications may be performed as noted. The reflux portion of the exam is performed with the patient in reverse Trendelenburg.  +---------+---------------+---------+-----------+----------+--------------+ RIGHT    CompressibilityPhasicitySpontaneityPropertiesThrombus Aging +---------+---------------+---------+-----------+----------+--------------+ CFV       Full           Yes      Yes                                 +---------+---------------+---------+-----------+----------+--------------+ SFJ      Full                                                        +---------+---------------+---------+-----------+----------+--------------+ FV Prox  Full                                                        +---------+---------------+---------+-----------+----------+--------------+ FV Mid   Full                                                        +---------+---------------+---------+-----------+----------+--------------+ FV DistalFull                                                        +---------+---------------+---------+-----------+----------+--------------+ PFV      Full                                                        +---------+---------------+---------+-----------+----------+--------------+ POP      Full           Yes      Yes                                 +---------+---------------+---------+-----------+----------+--------------+ PTV      Full                                                        +---------+---------------+---------+-----------+----------+--------------+  [facility] PROGRESS NOTE   Karen Gardner ZOX:[id number] DOB: Dec 15, 1955 DOA: 01/25/2023  PCP: [name], MD  Brief History: 67 y.o. female with a past medical history of essential hypertension, diabetes mellitus type 2, coronary artery disease, GERD, hyperlipidemia who was in her usual state of health till the morning of admission when she woke up from sleep with chest pain located in the central part of the chest.  Troponins were negative.  EKG did not show any ischemic changes.  CT angiogram ruled out dissection and PE.  She was hospitalized for further management due to persistent pain.    Consultants: Gastroenterology.  Cardiology  Procedures: EGD    Subjective/Interval History: Patient mentions that she was feeling better most of the night but earlier this morning she again developed chest pain similar to before.  No other complaints offered.  .  Assessment/Plan:  Chest pain likely due to acute pericarditis Differential was broad to begin with.  Acute coronary syndrome was ruled out.  EKG EKG was nonischemic.  Troponins were normal.  CTA did not show any dissection or PE.  There was a pleuritic component to her pain so pericarditis was possibility.  However her symptoms were atypical for pericarditis as well.  She was complaining of dysphagia and was noted to have moderate hiatal hernia on CT.  Seen by gastroenterology underwent EGD which did not show any significant findings except for possible Barrett's esophagus. In view of this after discussions with cardiology patient was started on colchicine.  Cannot use NSAIDs due to CKD.  Was started on steroids. Patient was educated about the pericarditis and the need for colchicine. Patient has been started on full dose aspirin by cardiology and they have discontinued the prednisone for now. Limited echo to be done.  Pedal edema Lower extremity Doppler studies negative for DVT.  Edema is stable.  History of  coronary artery disease Followed by Dr. Herbie [place]. Her last cardiac catheterization was in November 2022: moderate to severe diffusely calcified coronary arteries with severe single-vessel disease involving a small caliber codominant RCA with multiple segments of 70-90% stenosis was noted.  It was felt that this would be best treated medically.  She is on aspirin beta-blocker statin prior to admission.  Also on Ranexa which will be continued for now.  Chronic kidney disease stage IIIa Renal function close to baseline.  Avoid nephrotoxic agents.   Increasing creatinine level noted today.  Recheck labs tomorrow.  Diabetes mellitus type 2 Monitor CBGs.  SSI.  Holding metformin.  She was also on Humalog 3 times a day as well as Trulicity once a week prior to admission. CBGs are poorly controlled likely due to prednisone that she received yesterday.  Should improve since prednisone has not been discontinued.  Will change her to resistant SSI for now.  May need basal insulin but will determine depending on glucose trends over the next 24 hours.  Essential hypertension Blood pressure better controlled over the last 24 hours.  Continue with carvedilol amlodipine and nitrates.  Hydralazine as needed.    Hiatal hernia/status post gastric sleeve surgery  The gastric sleeve surgery was done in 2015 for weight loss purposes.  Hiatal hernia noted on CT scan.  Suspected Barrett's esophagus GI to follow-up in the outpatient setting.  Normocytic anemia Hemoglobin close to baseline.  No evidence of overt bleeding.   DVT Prophylaxis: Lovenox Code Status: Full code Family Communication: Discussed with patient Disposition Plan: Home when improved.     Medications:  DVT)  Result Date: 01/26/2023  Lower Venous DVT Study Patient Name:  Karen Gardner  Date of Exam:   01/26/2023 Medical Rec #: [id number]         Accession #:    [id number] Date of Birth: [birth year]         Patient Gender: F Patient Age:   2 years Exam Location:  [facility] Procedure:      VAS [place] LOWER EXTREMITY VENOUS (DVT) Referring Phys: Osvaldo Shipper --------------------------------------------------------------------------------  Indications: Chest pain, Swelling, and Pain.  Comparison Study: No prior Performing Technologist: Fernande Bras  Examination Guidelines: A complete evaluation includes B-mode imaging, spectral Doppler, color Doppler, and power Doppler as needed of all accessible portions of each vessel. Bilateral testing is considered an integral part of a complete examination. Limited examinations for reoccurring indications may be performed as noted. The reflux portion of the exam is performed with the patient in reverse Trendelenburg.  +---------+---------------+---------+-----------+----------+--------------+ RIGHT    CompressibilityPhasicitySpontaneityPropertiesThrombus Aging +---------+---------------+---------+-----------+----------+--------------+ CFV       Full           Yes      Yes                                 +---------+---------------+---------+-----------+----------+--------------+ SFJ      Full                                                        +---------+---------------+---------+-----------+----------+--------------+ FV Prox  Full                                                        +---------+---------------+---------+-----------+----------+--------------+ FV Mid   Full                                                        +---------+---------------+---------+-----------+----------+--------------+ FV DistalFull                                                        +---------+---------------+---------+-----------+----------+--------------+ PFV      Full                                                        +---------+---------------+---------+-----------+----------+--------------+ POP      Full           Yes      Yes                                 +---------+---------------+---------+-----------+----------+--------------+ PTV      Full                                                        +---------+---------------+---------+-----------+----------+--------------+

## 2023-01-29 DIAGNOSIS — R079 Chest pain, unspecified: Secondary | ICD-10-CM | POA: Diagnosis not present

## 2023-01-29 DIAGNOSIS — R071 Chest pain on breathing: Secondary | ICD-10-CM

## 2023-01-29 DIAGNOSIS — I300 Acute nonspecific idiopathic pericarditis: Secondary | ICD-10-CM | POA: Diagnosis not present

## 2023-01-29 DIAGNOSIS — I25119 Atherosclerotic heart disease of native coronary artery with unspecified angina pectoris: Secondary | ICD-10-CM | POA: Diagnosis not present

## 2023-01-29 DIAGNOSIS — I10 Essential (primary) hypertension: Secondary | ICD-10-CM | POA: Diagnosis not present

## 2023-01-29 DIAGNOSIS — E119 Type 2 diabetes mellitus without complications: Secondary | ICD-10-CM | POA: Diagnosis not present

## 2023-01-29 LAB — CBC
HCT: 32.5 % — ABNORMAL LOW (ref 36.0–46.0)
Hemoglobin: 10.3 g/dL — ABNORMAL LOW (ref 12.0–15.0)
MCH: 28.5 pg (ref 26.0–34.0)
MCHC: 31.7 g/dL (ref 30.0–36.0)
MCV: 90 fL (ref 80.0–100.0)
Platelets: 188 10*3/uL (ref 150–400)
RBC: 3.61 MIL/uL — ABNORMAL LOW (ref 3.87–5.11)
RDW: 12.9 % (ref 11.5–15.5)
WBC: 5.9 10*3/uL (ref 4.0–10.5)
nRBC: 0 % (ref 0.0–0.2)

## 2023-01-29 LAB — BASIC METABOLIC PANEL
Anion gap: 12 (ref 5–15)
BUN: 30 mg/dL — ABNORMAL HIGH (ref 8–23)
CO2: 20 mmol/L — ABNORMAL LOW (ref 22–32)
Calcium: 8.7 mg/dL — ABNORMAL LOW (ref 8.9–10.3)
Chloride: 102 mmol/L (ref 98–111)
Creatinine, Ser: 1.35 mg/dL — ABNORMAL HIGH (ref 0.44–1.00)
GFR, Estimated: 43 mL/min — ABNORMAL LOW (ref >60)
Glucose, Bld: 209 mg/dL — ABNORMAL HIGH (ref 70–99)
Potassium: 3.3 mmol/L — ABNORMAL LOW (ref 3.5–5.1)
Sodium: 134 mmol/L — ABNORMAL LOW (ref 135–145)

## 2023-01-29 LAB — GLUCOSE, CAPILLARY
Glucose-Capillary: 296 mg/dL — ABNORMAL HIGH (ref 70–99)
Glucose-Capillary: 185 mg/dL — ABNORMAL HIGH (ref 70–99)
Glucose-Capillary: 157 mg/dL — ABNORMAL HIGH (ref 70–99)

## 2023-01-29 MED ORDER — POTASSIUM CHLORIDE CRYS ER 20 MEQ PO TBCR
40.0000 meq | EXTENDED_RELEASE_TABLET | Freq: Once | ORAL | Status: AC
  Administered 2023-01-29: 40 meq via ORAL
  Filled 2023-01-29: qty 2

## 2023-01-29 MED ORDER — SUCRALFATE 1 GM/10ML PO SUSP
1.0000 g | Freq: Three times a day (TID) | ORAL | Status: DC
  Administered 2023-01-29 – 2023-01-30 (×4): 1 g via ORAL
  Filled 2023-01-29 (×4): qty 10

## 2023-01-29 NOTE — Inpatient Diabetes Management (Signed)
Inpatient Diabetes Program Recommendations  AACE/ADA: New Consensus Statement on Inpatient Glycemic Control (2015)  Target Ranges:  Prepandial:   less than 140 mg/dL      Peak postprandial:   less than 180 mg/dL (1-2 hours)      Critically ill patients:  140 - 180 mg/dL   Lab Results  Component Value Date   GLUCAP 389 (H) 01/28/2023   HGBA1C 7.4 (H) 01/25/2023    Latest Reference Range & Units 01/28/23 07:45 01/28/23 11:42 01/28/23 16:47 01/28/23 21:22 01/28/23 23:02  Glucose-Capillary 70 - 99 mg/dL 914 (H) 782 (H) 956 (H) 405 (H) 389 (H)  (H): Data is abnormally high  Review of Glycemic Control  Diabetes history: DM2 Outpatient Diabetes medications: Humalog 8 units tid meal coverage, Metformin 250 mg bid, Trulicity 0.75 weekkly Current orders for Inpatient glycemic control: Semglee 10 units daily, Novolog 0-20 units tid, 0-5 units hs  Inpatient Diabetes Program Recommendations:   Please consider: -Add Novolog 5 units tid meal coverage if eats 50%  Thank you, Darel Hong E. Merek Niu, RN, MSN, CDCES  Diabetes Coordinator Inpatient Glycemic Control Team Team Pager [phone] (8am-5pm) 01/29/2023 11:23 AM

## 2023-01-29 NOTE — Plan of Care (Signed)

## 2023-01-29 NOTE — Progress Notes (Signed)
[facility] PROGRESS NOTE   Karen Gardner EPP:[id number] DOB: July 22, 1955 DOA: 01/25/2023  PCP: [name], MD  Brief History: 67 y.o. female with a past medical history of essential hypertension, diabetes mellitus type 2, coronary artery disease, GERD, hyperlipidemia who was in her usual state of health till the morning of admission when she woke up from sleep with chest pain located in the central part of the chest.  Troponins were negative.  EKG did not show any ischemic changes.  CT angiogram ruled out dissection and PE.  She was hospitalized for further management due to persistent pain.    Consultants: Gastroenterology.  Cardiology  Procedures: EGD    Subjective/Interval History: Patient mentions that her pain was very well-controlled up until early this morning when she started having pain in the chest area again.  Has been able to ambulate without difficulty.  No nausea vomiting.  No shortness of breath currently.    Assessment/Plan:  Chest pain likely due to acute pericarditis Differential was broad to begin with.  Acute coronary syndrome was ruled out.  EKG EKG was nonischemic.  Troponins were normal.  CTA did not show any dissection or PE.  There was a pleuritic component to her pain so pericarditis was possibility.  However her symptoms were atypical for pericarditis as well.  She was complaining of dysphagia and was noted to have moderate hiatal hernia on CT.  Seen by gastroenterology underwent EGD which did not show any significant findings except for possible Barrett's esophagus. Patient subsequently started on colchicine.  Initially patient was started on prednisone as she does have CKD and gastritis and we could not use NSAIDs.  Subsequently prednisone was discontinued by cardiology and she was placed on full dose aspirin.  They want the patient to take aspirin 3 times a day for a week followed by twice a day for a week and then once a day.  PPI being  continued.  Colchicine be continued. Echocardiogram does not show any pericardial effusion.  If patient remains stable for the next 24 hours she could be discharged home tomorrow if cleared by cardiology.  Pedal edema Lower extremity Doppler studies negative for DVT.  Edema is stable.  History of coronary artery disease Followed by Dr. Herbie [place]. Her last cardiac catheterization was in November 2022: moderate to severe diffusely calcified coronary arteries with severe single-vessel disease involving a small caliber codominant RCA with multiple segments of 70-90% stenosis was noted.  It was felt that this would be best treated medically.  She is on aspirin beta-blocker statin prior to admission.  Also on Ranexa.  Chronic kidney disease stage IIIa Renal function close to baseline.  Avoid nephrotoxic agents.   Replace potassium.  Diabetes mellitus type 2 HbA1c 7.4.  She is on Humalog 3 times a day, metformin and Trulicity prior to admission.  Elevated glucose levels likely due to steroids that she was given. Also started on basal insulin overnight.  Should improve as now she is no longer on prednisone.  Essential hypertension Blood pressure is reasonably well-controlled.  Continue with carvedilol amlodipine and nitrates.  Hydralazine as needed.    Hiatal hernia/status post gastric sleeve surgery  The gastric sleeve surgery was done in 2015 for weight loss purposes.  Hiatal hernia noted on CT scan.  Suspected Barrett's esophagus GI to follow-up in the outpatient setting.  Normocytic anemia Hemoglobin close to baseline.  No evidence for overt bleeding.   DVT Prophylaxis: Lovenox Code Status: Full code Family Communication: Discussed  INR 1.1    CBG: Recent Labs  Lab 01/28/23 0745 01/28/23 1142 01/28/23 1647 01/28/23 2122 01/28/23 2302  GLUCAP 318* 355* 295* 405* 389*     Radiology Studies: ECHOCARDIOGRAM LIMITED  Result Date: 01/28/2023    ECHOCARDIOGRAM LIMITED REPORT   Patient Name:   Karen Gardner Date of Exam: 01/28/2023 Medical Rec #:  [id number]        Height:       65.0 in Accession #:    [id number]       Weight:       180.0 lb Date of Birth:  04-Nov-1955        BSA:          1.892 m Patient Age:    67 years         BP:           155/65 mmHg Patient Gender: F                HR:           72 bpm. Exam Location:  Inpatient Procedure: Limited Echo, Color Doppler and Cardiac Doppler Indications:    R07.9* Chest pain, unspecified  History:        Patient has prior history of Echocardiogram examinations, most                 recent 07/17/2022. CAD; Risk Factors:Hypertension, Diabetes and                 Dyslipidemia.  Sonographer:    Irving Burton Senior RDCS Referring Phys: [id number] Ronnald Ramp O'NEAL IMPRESSIONS  1. No pericardial effusion noted.  2. Left ventricular ejection fraction, by estimation, is 60 to 65%. The left ventricle has normal function. The left ventricle has no regional wall motion abnormalities.  3. Right ventricular systolic function is normal. The right  ventricular size is normal.  4. The aortic valve is tricuspid.  5. The inferior vena cava is dilated in size with >50% respiratory variability, suggesting right atrial pressure of 8 mmHg. FINDINGS  Left Ventricle: Left ventricular ejection fraction, by estimation, is 60 to 65%. The left ventricle has normal function. The left ventricle has no regional wall motion abnormalities. Right Ventricle: The right ventricular size is normal. No increase in right ventricular wall thickness. Right ventricular systolic function is normal. Left Atrium: Left atrial size was normal in size. Right Atrium: Right atrial size was normal in size. Pericardium: There is no evidence of pericardial effusion. Aortic Valve: The aortic valve is tricuspid. Venous: The inferior vena cava is dilated in size with greater than 50% respiratory variability, suggesting right atrial pressure of 8 mmHg. Additional Comments: Spectral Doppler performed. Color Doppler performed.  Lennie Odor MD Electronically signed by Lennie Odor MD Signature Date/Time: 01/28/2023/4:09:35 PM    Final        LOS: 3 days   Osvaldo Shipper  [facility] Pager on [email or web address]  01/29/2023, 10:54 AM  INR 1.1    CBG: Recent Labs  Lab 01/28/23 0745 01/28/23 1142 01/28/23 1647 01/28/23 2122 01/28/23 2302  GLUCAP 318* 355* 295* 405* 389*     Radiology Studies: ECHOCARDIOGRAM LIMITED  Result Date: 01/28/2023    ECHOCARDIOGRAM LIMITED REPORT   Patient Name:   Karen Gardner Date of Exam: 01/28/2023 Medical Rec #:  [id number]        Height:       65.0 in Accession #:    [id number]       Weight:       180.0 lb Date of Birth:  04-Nov-1955        BSA:          1.892 m Patient Age:    67 years         BP:           155/65 mmHg Patient Gender: F                HR:           72 bpm. Exam Location:  Inpatient Procedure: Limited Echo, Color Doppler and Cardiac Doppler Indications:    R07.9* Chest pain, unspecified  History:        Patient has prior history of Echocardiogram examinations, most                 recent 07/17/2022. CAD; Risk Factors:Hypertension, Diabetes and                 Dyslipidemia.  Sonographer:    Irving Burton Senior RDCS Referring Phys: [id number] Ronnald Ramp O'NEAL IMPRESSIONS  1. No pericardial effusion noted.  2. Left ventricular ejection fraction, by estimation, is 60 to 65%. The left ventricle has normal function. The left ventricle has no regional wall motion abnormalities.  3. Right ventricular systolic function is normal. The right  ventricular size is normal.  4. The aortic valve is tricuspid.  5. The inferior vena cava is dilated in size with >50% respiratory variability, suggesting right atrial pressure of 8 mmHg. FINDINGS  Left Ventricle: Left ventricular ejection fraction, by estimation, is 60 to 65%. The left ventricle has normal function. The left ventricle has no regional wall motion abnormalities. Right Ventricle: The right ventricular size is normal. No increase in right ventricular wall thickness. Right ventricular systolic function is normal. Left Atrium: Left atrial size was normal in size. Right Atrium: Right atrial size was normal in size. Pericardium: There is no evidence of pericardial effusion. Aortic Valve: The aortic valve is tricuspid. Venous: The inferior vena cava is dilated in size with greater than 50% respiratory variability, suggesting right atrial pressure of 8 mmHg. Additional Comments: Spectral Doppler performed. Color Doppler performed.  Lennie Odor MD Electronically signed by Lennie Odor MD Signature Date/Time: 01/28/2023/4:09:35 PM    Final        LOS: 3 days   Osvaldo Shipper  [facility] Pager on [email or web address]  01/29/2023, 10:54 AM

## 2023-01-29 NOTE — Care Management Important Message (Signed)
Important Message  Patient Details  Name: Karen Gardner MRN: [id number] Date of Birth: 1955-10-06   Important Message Given:  Yes - Medicare IM     Dorena Bodo 01/29/2023, 3:06 PM

## 2023-01-29 NOTE — Progress Notes (Signed)
Rounding Note    Patient Name: Karen Gardner Date of Encounter: 01/29/2023  [facility] Cardiologist: Bryan Lemma, MD   Subjective   Continues to have constant, sharp chest pain, though mildly improved from admission. Did not sleep due to pain. Not positional, but worse with deep inspiration. Reviewed watching for melena at length, see below.  Inpatient Medications    Scheduled Meds:  amLODipine  10 mg Oral Daily   aspirin  650 mg Oral TID   Followed by   Melene Muller ON 02/03/2023] aspirin  650 mg Oral BID   atorvastatin  80 mg Oral QHS   carvedilol  6.25 mg Oral BID WC   colchicine  0.6 mg Oral BID   enoxaparin (LOVENOX) injection  40 mg Subcutaneous Q24H   escitalopram  10 mg Oral QHS   insulin aspart  0-20 Units Subcutaneous TID WC   insulin aspart  0-5 Units Subcutaneous QHS   insulin glargine-yfgn  10 Units Subcutaneous QHS   isosorbide mononitrate  60 mg Oral Daily   latanoprost  1 drop Both Eyes QHS   pantoprazole  40 mg Oral BID AC   ranolazine  1,000 mg Oral BID   Continuous Infusions:  PRN Meds: acetaminophen **OR** acetaminophen, hydrALAZINE, HYDROmorphone (DILAUDID) injection, lidocaine, ondansetron **OR** ondansetron (ZOFRAN) IV, oxyCODONE, traZODone   Vital Signs    Vitals:   01/28/23 0746 01/28/23 2120 01/29/23 0457 01/29/23 0726  BP: (!) 155/65 118/72 (!) 109/56 137/69  Pulse: 71 66 66 70  Resp: 18 18 18 16   Temp: 98 F (36.7 C) 98.2 F (36.8 C) 98 F (36.7 C) 98.5 F (36.9 C)  TempSrc:  Oral Oral Oral  SpO2: 98% 97% 98% 96%  Weight:   82.6 kg   Height:       No intake or output data in the 24 hours ending 01/29/23 1034    01/29/2023    4:57 AM 01/25/2023    1:17 PM 11/14/2022    9:57 AM  Last 3 Weights  Weight (lbs) 182 lb 180 lb 176 lb 12.8 oz  Weight (kg) 82.555 kg 81.647 kg 80.196 kg      Telemetry    Not on telemetry - Personally Reviewed  Physical Exam   GEN: No acute distress.   Neck: No JVD Cardiac: RRR, no  murmurs, rubs, or gallops.  Respiratory: Clear to auscultation bilaterally. GI: Soft, nontender, non-distended  MS: No edema; No deformity. Neuro:  Nonfocal  Psych: Normal affect   New pertinent results (labs, ECG, imaging, cardiac studies)    Limited echo without pericardial effusion. Normal LVEF, normal RV size/function, no significant valve disease noted  Patient Profile     67 y.o. female with PMH CAD, type II diabetes, CKD stage 3a, hypertension admitted with acute chest pain  Assessment & Plan    Chest pain, inflammatory markers-> acute pericarditis -HsTn unremarkable -echo without pericardial effusion -started on aspirin 650 mg TID for 7 days, then 650 BID for 7 days, then 650 mg daily for 7 days. Monitor for bleeding. With gastritis, need to be especially cautious for melena/worsening stomach upset. Can stop aspirin early if pain resolves. Avoid NSAIDs given CAD and CKD -continue colchicine 0.6 mg BID for 3 mos -on PPI for GI protection  CAD -medically managed, HsTn unremarkable as above -continue statin, imdur, amlodipine, ranexa, carvedilol  Gastritis Anemia of chronic disease -as above, with aspirin, monitor closely for melena/worsening pain -Hgb stable (ranges in the 10s), platelets stable (has been  as low as 127 in the last 6 mos)  Chronic kidney disease, stage 3a -avoid NSAIDs  Type II diabetes -on trulicity, insulin, metformin as an outpatient -with CKD, would consider SGLT2i as an outpatient if no contraindication     Signed, Jodelle Red, MD  01/29/2023, 10:34 AM

## 2023-01-30 ENCOUNTER — Encounter (HOSPITAL_COMMUNITY): Payer: Self-pay | Admitting: Internal Medicine

## 2023-01-30 ENCOUNTER — Other Ambulatory Visit (HOSPITAL_COMMUNITY): Payer: Self-pay

## 2023-01-30 ENCOUNTER — Encounter: Payer: Self-pay | Admitting: Hematology & Oncology

## 2023-01-30 DIAGNOSIS — I300 Acute nonspecific idiopathic pericarditis: Secondary | ICD-10-CM | POA: Diagnosis not present

## 2023-01-30 DIAGNOSIS — R071 Chest pain on breathing: Secondary | ICD-10-CM | POA: Diagnosis not present

## 2023-01-30 LAB — BASIC METABOLIC PANEL
Anion gap: 11 (ref 5–15)
BUN: 28 mg/dL — ABNORMAL HIGH (ref 8–23)
CO2: 22 mmol/L (ref 22–32)
Calcium: 8.6 mg/dL — ABNORMAL LOW (ref 8.9–10.3)
Chloride: 102 mmol/L (ref 98–111)
Creatinine, Ser: 1.32 mg/dL — ABNORMAL HIGH (ref 0.44–1.00)
GFR, Estimated: 44 mL/min — ABNORMAL LOW (ref >60)
Glucose, Bld: 296 mg/dL — ABNORMAL HIGH (ref 70–99)
Potassium: 4.3 mmol/L (ref 3.5–5.1)
Sodium: 135 mmol/L (ref 135–145)

## 2023-01-30 LAB — GLUCOSE, CAPILLARY
Glucose-Capillary: 285 mg/dL — ABNORMAL HIGH (ref 70–99)
Glucose-Capillary: 271 mg/dL — ABNORMAL HIGH (ref 70–99)
Glucose-Capillary: 166 mg/dL — ABNORMAL HIGH (ref 70–99)

## 2023-01-30 MED ORDER — COLCHICINE 0.6 MG PO TABS
0.6000 mg | ORAL_TABLET | Freq: Two times a day (BID) | ORAL | 2 refills | Status: AC
  Filled 2023-01-30: qty 60, 30d supply, fill #0

## 2023-01-30 MED ORDER — OXYCODONE HCL 5 MG PO TABS
5.0000 mg | ORAL_TABLET | Freq: Four times a day (QID) | ORAL | 0 refills | Status: AC | PRN
  Filled 2023-01-30: qty 30, 7d supply, fill #0

## 2023-01-30 MED ORDER — ASPIRIN 325 MG PO TBEC
DELAYED_RELEASE_TABLET | ORAL | 0 refills | Status: AC
  Filled 2023-01-30: qty 66, 18d supply, fill #0

## 2023-01-30 MED ORDER — ASPIRIN EC 81 MG PO TBEC: 81.0000 mg | DELAYED_RELEASE_TABLET | Freq: Every day | ORAL | Status: AC

## 2023-01-30 MED ORDER — PANTOPRAZOLE SODIUM 40 MG PO TBEC
40.0000 mg | DELAYED_RELEASE_TABLET | Freq: Two times a day (BID) | ORAL | 1 refills | Status: AC
  Filled 2023-01-30: qty 60, 30d supply, fill #0

## 2023-01-30 NOTE — Discharge Summary (Signed)
[facility]  Physician Discharge Summary   Patient ID: Karen Gardner MRN: [id number] DOB/AGE: [birth year] 67 y.o.  Admit date: 01/25/2023 Discharge date:   01/30/2023   PCP: [name], MD  DISCHARGE DIAGNOSES:  Acute Pericarditis   DM2 (diabetes mellitus, type 2) (HCC)   Essential hypertension   Coronary artery disease involving native coronary artery of native heart with angina pectoris (HCC)   Hiatal hernia   Intractable pain   RECOMMENDATIONS FOR OUTPATIENT FOLLOW UP: Patient has an appointment with her cardiologist next week    Home Health: None Equipment/Devices: None  CODE STATUS: Full code  DISCHARGE CONDITION: fair  Diet recommendation: As before  INITIAL HISTORY: 67 y.o. female with a past medical history of essential hypertension, diabetes mellitus type 2, coronary artery disease, GERD, hyperlipidemia who was in her usual state of health till the morning of admission when she woke up from sleep with chest pain located in the central part of the chest.  Troponins were negative.  EKG did not show any ischemic changes.  CT angiogram ruled out dissection and PE.  She was hospitalized for further management due to persistent pain.     Consultants: Gastroenterology.  Cardiology   Procedures: EGD  HOSPITAL COURSE:   Chest pain likely due to acute pericarditis Differential was broad to begin with.  Acute coronary syndrome was ruled out.  EKG EKG was nonischemic.  Troponins were normal.  CTA did not show any dissection or PE.  There was a pleuritic component to her pain so pericarditis was possibility.  However her symptoms were atypical for pericarditis as well.  She was complaining of dysphagia and was noted to have moderate hiatal hernia on CT.  Seen by gastroenterology underwent EGD which did not show any significant findings except for possible Barrett's esophagus. Patient subsequently started on colchicine.  Initially patient was started  on prednisone as she does have CKD and gastritis and we could not use NSAIDs.  Subsequently prednisone was discontinued by cardiology and she was placed on full dose aspirin.  They want the patient to take aspirin 3 times a day for a week followed by twice a day for a week and then once a day for 7 days.  Colchicine to be continued for 3 months.  PPI BID while on high-dose aspirin. Echocardiogram did not show any pericardial effusion.  Outpatient follow-up with cardiology.   Pedal edema Lower extremity Doppler studies negative for DVT.  Edema is stable.   History of coronary artery disease Followed by Dr. Herbie [place]. Her last cardiac catheterization was in November 2022: moderate to severe diffusely calcified coronary arteries with severe single-vessel disease involving a small caliber codominant RCA with multiple segments of 70-90% stenosis was noted.  It was felt that this would be best treated medically.  She is on aspirin beta-blocker statin prior to admission.  Also on Ranexa.   Chronic kidney disease stage IIIa Renal function close to baseline.    Diabetes mellitus type 2 HbA1c 7.4.  She is on Humalog 3 times a day, metformin and Trulicity prior to admission.  Elevated glucose levels likely due to steroids that she was given. Also started on basal insulin overnight.  Should improve as now she is no longer on prednisone.   Essential hypertension Blood pressure is reasonably well-controlled.  Continue with carvedilol amlodipine and nitrates.  Hydralazine as needed.  Hold ARB due to elevated creatinine.   Hiatal hernia/status post gastric sleeve surgery  The gastric sleeve surgery was done  The left ventricle has no regional wall motion abnormalities.  3. Right ventricular systolic function is normal. The right ventricular size is normal.  4. The aortic valve is tricuspid.  5. The inferior vena cava is dilated in size with >50% respiratory variability, suggesting right atrial pressure of 8 mmHg. FINDINGS  Left Ventricle: Left ventricular ejection fraction, by estimation, is 60 to 65%. The left ventricle has normal function. The left ventricle has no regional wall motion abnormalities. Right Ventricle: The right ventricular size is normal. No increase in right ventricular wall thickness. Right ventricular systolic function is normal. Left Atrium: Left atrial size was normal in size. Right Atrium: Right atrial size was normal in size. Pericardium: There is no evidence of pericardial effusion. Aortic Valve: The aortic valve is tricuspid. Venous: The inferior vena cava is dilated in size with greater than 50% respiratory variability, suggesting right atrial pressure of 8 mmHg. Additional Comments: Spectral Doppler performed. Color Doppler performed.  Lennie Odor MD Electronically signed by Lennie Odor MD Signature Date/Time: 01/28/2023/4:09:35 PM    Final    VAS [place] LOWER EXTREMITY VENOUS (DVT)  Result Date: 01/26/2023  Lower Venous DVT Study Patient Name:  Karen Gardner  Date of Exam:   01/26/2023 Medical Rec #: [id number]         Accession #:    [id number] Date of Birth: 11/19/1955         Patient Gender: F Patient Age:   67 years Exam Location:  [facility] Procedure:      VAS [place] LOWER EXTREMITY VENOUS (DVT) Referring Phys: Osvaldo Shipper  --------------------------------------------------------------------------------  Indications: Chest pain, Swelling, and Pain.  Comparison Study: No prior Performing Technologist: Fernande Bras  Examination Guidelines: A complete evaluation includes B-mode imaging, spectral Doppler, color Doppler, and power Doppler as needed of all accessible portions of each vessel. Bilateral testing is considered an integral part of a complete examination. Limited examinations for reoccurring indications may be performed as noted. The reflux portion of the exam is performed with the patient in reverse Trendelenburg.  +---------+---------------+---------+-----------+----------+--------------+ RIGHT    CompressibilityPhasicitySpontaneityPropertiesThrombus Aging +---------+---------------+---------+-----------+----------+--------------+ CFV      Full           Yes      Yes                                 +---------+---------------+---------+-----------+----------+--------------+ SFJ      Full                                                        +---------+---------------+---------+-----------+----------+--------------+ FV Prox  Full                                                        +---------+---------------+---------+-----------+----------+--------------+ FV Mid   Full                                                        +---------+---------------+---------+-----------+----------+--------------+  [facility]  Physician Discharge Summary   Patient ID: Karen Gardner MRN: [id number] DOB/AGE: [birth year] 67 y.o.  Admit date: 01/25/2023 Discharge date:   01/30/2023   PCP: [name], MD  DISCHARGE DIAGNOSES:  Acute Pericarditis   DM2 (diabetes mellitus, type 2) (HCC)   Essential hypertension   Coronary artery disease involving native coronary artery of native heart with angina pectoris (HCC)   Hiatal hernia   Intractable pain   RECOMMENDATIONS FOR OUTPATIENT FOLLOW UP: Patient has an appointment with her cardiologist next week    Home Health: None Equipment/Devices: None  CODE STATUS: Full code  DISCHARGE CONDITION: fair  Diet recommendation: As before  INITIAL HISTORY: 67 y.o. female with a past medical history of essential hypertension, diabetes mellitus type 2, coronary artery disease, GERD, hyperlipidemia who was in her usual state of health till the morning of admission when she woke up from sleep with chest pain located in the central part of the chest.  Troponins were negative.  EKG did not show any ischemic changes.  CT angiogram ruled out dissection and PE.  She was hospitalized for further management due to persistent pain.     Consultants: Gastroenterology.  Cardiology   Procedures: EGD  HOSPITAL COURSE:   Chest pain likely due to acute pericarditis Differential was broad to begin with.  Acute coronary syndrome was ruled out.  EKG EKG was nonischemic.  Troponins were normal.  CTA did not show any dissection or PE.  There was a pleuritic component to her pain so pericarditis was possibility.  However her symptoms were atypical for pericarditis as well.  She was complaining of dysphagia and was noted to have moderate hiatal hernia on CT.  Seen by gastroenterology underwent EGD which did not show any significant findings except for possible Barrett's esophagus. Patient subsequently started on colchicine.  Initially patient was started  on prednisone as she does have CKD and gastritis and we could not use NSAIDs.  Subsequently prednisone was discontinued by cardiology and she was placed on full dose aspirin.  They want the patient to take aspirin 3 times a day for a week followed by twice a day for a week and then once a day for 7 days.  Colchicine to be continued for 3 months.  PPI BID while on high-dose aspirin. Echocardiogram did not show any pericardial effusion.  Outpatient follow-up with cardiology.   Pedal edema Lower extremity Doppler studies negative for DVT.  Edema is stable.   History of coronary artery disease Followed by Dr. Herbie [place]. Her last cardiac catheterization was in November 2022: moderate to severe diffusely calcified coronary arteries with severe single-vessel disease involving a small caliber codominant RCA with multiple segments of 70-90% stenosis was noted.  It was felt that this would be best treated medically.  She is on aspirin beta-blocker statin prior to admission.  Also on Ranexa.   Chronic kidney disease stage IIIa Renal function close to baseline.    Diabetes mellitus type 2 HbA1c 7.4.  She is on Humalog 3 times a day, metformin and Trulicity prior to admission.  Elevated glucose levels likely due to steroids that she was given. Also started on basal insulin overnight.  Should improve as now she is no longer on prednisone.   Essential hypertension Blood pressure is reasonably well-controlled.  Continue with carvedilol amlodipine and nitrates.  Hydralazine as needed.  Hold ARB due to elevated creatinine.   Hiatal hernia/status post gastric sleeve surgery  The gastric sleeve surgery was done  [facility]  Physician Discharge Summary   Patient ID: Karen Gardner MRN: [id number] DOB/AGE: [birth year] 67 y.o.  Admit date: 01/25/2023 Discharge date:   01/30/2023   PCP: [name], MD  DISCHARGE DIAGNOSES:  Acute Pericarditis   DM2 (diabetes mellitus, type 2) (HCC)   Essential hypertension   Coronary artery disease involving native coronary artery of native heart with angina pectoris (HCC)   Hiatal hernia   Intractable pain   RECOMMENDATIONS FOR OUTPATIENT FOLLOW UP: Patient has an appointment with her cardiologist next week    Home Health: None Equipment/Devices: None  CODE STATUS: Full code  DISCHARGE CONDITION: fair  Diet recommendation: As before  INITIAL HISTORY: 67 y.o. female with a past medical history of essential hypertension, diabetes mellitus type 2, coronary artery disease, GERD, hyperlipidemia who was in her usual state of health till the morning of admission when she woke up from sleep with chest pain located in the central part of the chest.  Troponins were negative.  EKG did not show any ischemic changes.  CT angiogram ruled out dissection and PE.  She was hospitalized for further management due to persistent pain.     Consultants: Gastroenterology.  Cardiology   Procedures: EGD  HOSPITAL COURSE:   Chest pain likely due to acute pericarditis Differential was broad to begin with.  Acute coronary syndrome was ruled out.  EKG EKG was nonischemic.  Troponins were normal.  CTA did not show any dissection or PE.  There was a pleuritic component to her pain so pericarditis was possibility.  However her symptoms were atypical for pericarditis as well.  She was complaining of dysphagia and was noted to have moderate hiatal hernia on CT.  Seen by gastroenterology underwent EGD which did not show any significant findings except for possible Barrett's esophagus. Patient subsequently started on colchicine.  Initially patient was started  on prednisone as she does have CKD and gastritis and we could not use NSAIDs.  Subsequently prednisone was discontinued by cardiology and she was placed on full dose aspirin.  They want the patient to take aspirin 3 times a day for a week followed by twice a day for a week and then once a day for 7 days.  Colchicine to be continued for 3 months.  PPI BID while on high-dose aspirin. Echocardiogram did not show any pericardial effusion.  Outpatient follow-up with cardiology.   Pedal edema Lower extremity Doppler studies negative for DVT.  Edema is stable.   History of coronary artery disease Followed by Dr. Herbie [place]. Her last cardiac catheterization was in November 2022: moderate to severe diffusely calcified coronary arteries with severe single-vessel disease involving a small caliber codominant RCA with multiple segments of 70-90% stenosis was noted.  It was felt that this would be best treated medically.  She is on aspirin beta-blocker statin prior to admission.  Also on Ranexa.   Chronic kidney disease stage IIIa Renal function close to baseline.    Diabetes mellitus type 2 HbA1c 7.4.  She is on Humalog 3 times a day, metformin and Trulicity prior to admission.  Elevated glucose levels likely due to steroids that she was given. Also started on basal insulin overnight.  Should improve as now she is no longer on prednisone.   Essential hypertension Blood pressure is reasonably well-controlled.  Continue with carvedilol amlodipine and nitrates.  Hydralazine as needed.  Hold ARB due to elevated creatinine.   Hiatal hernia/status post gastric sleeve surgery  The gastric sleeve surgery was done  The left ventricle has no regional wall motion abnormalities.  3. Right ventricular systolic function is normal. The right ventricular size is normal.  4. The aortic valve is tricuspid.  5. The inferior vena cava is dilated in size with >50% respiratory variability, suggesting right atrial pressure of 8 mmHg. FINDINGS  Left Ventricle: Left ventricular ejection fraction, by estimation, is 60 to 65%. The left ventricle has normal function. The left ventricle has no regional wall motion abnormalities. Right Ventricle: The right ventricular size is normal. No increase in right ventricular wall thickness. Right ventricular systolic function is normal. Left Atrium: Left atrial size was normal in size. Right Atrium: Right atrial size was normal in size. Pericardium: There is no evidence of pericardial effusion. Aortic Valve: The aortic valve is tricuspid. Venous: The inferior vena cava is dilated in size with greater than 50% respiratory variability, suggesting right atrial pressure of 8 mmHg. Additional Comments: Spectral Doppler performed. Color Doppler performed.  Lennie Odor MD Electronically signed by Lennie Odor MD Signature Date/Time: 01/28/2023/4:09:35 PM    Final    VAS [place] LOWER EXTREMITY VENOUS (DVT)  Result Date: 01/26/2023  Lower Venous DVT Study Patient Name:  Karen Gardner  Date of Exam:   01/26/2023 Medical Rec #: [id number]         Accession #:    [id number] Date of Birth: 11/19/1955         Patient Gender: F Patient Age:   67 years Exam Location:  [facility] Procedure:      VAS [place] LOWER EXTREMITY VENOUS (DVT) Referring Phys: Osvaldo Shipper  --------------------------------------------------------------------------------  Indications: Chest pain, Swelling, and Pain.  Comparison Study: No prior Performing Technologist: Fernande Bras  Examination Guidelines: A complete evaluation includes B-mode imaging, spectral Doppler, color Doppler, and power Doppler as needed of all accessible portions of each vessel. Bilateral testing is considered an integral part of a complete examination. Limited examinations for reoccurring indications may be performed as noted. The reflux portion of the exam is performed with the patient in reverse Trendelenburg.  +---------+---------------+---------+-----------+----------+--------------+ RIGHT    CompressibilityPhasicitySpontaneityPropertiesThrombus Aging +---------+---------------+---------+-----------+----------+--------------+ CFV      Full           Yes      Yes                                 +---------+---------------+---------+-----------+----------+--------------+ SFJ      Full                                                        +---------+---------------+---------+-----------+----------+--------------+ FV Prox  Full                                                        +---------+---------------+---------+-----------+----------+--------------+ FV Mid   Full                                                        +---------+---------------+---------+-----------+----------+--------------+  The left ventricle has no regional wall motion abnormalities.  3. Right ventricular systolic function is normal. The right ventricular size is normal.  4. The aortic valve is tricuspid.  5. The inferior vena cava is dilated in size with >50% respiratory variability, suggesting right atrial pressure of 8 mmHg. FINDINGS  Left Ventricle: Left ventricular ejection fraction, by estimation, is 60 to 65%. The left ventricle has normal function. The left ventricle has no regional wall motion abnormalities. Right Ventricle: The right ventricular size is normal. No increase in right ventricular wall thickness. Right ventricular systolic function is normal. Left Atrium: Left atrial size was normal in size. Right Atrium: Right atrial size was normal in size. Pericardium: There is no evidence of pericardial effusion. Aortic Valve: The aortic valve is tricuspid. Venous: The inferior vena cava is dilated in size with greater than 50% respiratory variability, suggesting right atrial pressure of 8 mmHg. Additional Comments: Spectral Doppler performed. Color Doppler performed.  Lennie Odor MD Electronically signed by Lennie Odor MD Signature Date/Time: 01/28/2023/4:09:35 PM    Final    VAS [place] LOWER EXTREMITY VENOUS (DVT)  Result Date: 01/26/2023  Lower Venous DVT Study Patient Name:  Karen Gardner  Date of Exam:   01/26/2023 Medical Rec #: [id number]         Accession #:    [id number] Date of Birth: 11/19/1955         Patient Gender: F Patient Age:   67 years Exam Location:  [facility] Procedure:      VAS [place] LOWER EXTREMITY VENOUS (DVT) Referring Phys: Osvaldo Shipper  --------------------------------------------------------------------------------  Indications: Chest pain, Swelling, and Pain.  Comparison Study: No prior Performing Technologist: Fernande Bras  Examination Guidelines: A complete evaluation includes B-mode imaging, spectral Doppler, color Doppler, and power Doppler as needed of all accessible portions of each vessel. Bilateral testing is considered an integral part of a complete examination. Limited examinations for reoccurring indications may be performed as noted. The reflux portion of the exam is performed with the patient in reverse Trendelenburg.  +---------+---------------+---------+-----------+----------+--------------+ RIGHT    CompressibilityPhasicitySpontaneityPropertiesThrombus Aging +---------+---------------+---------+-----------+----------+--------------+ CFV      Full           Yes      Yes                                 +---------+---------------+---------+-----------+----------+--------------+ SFJ      Full                                                        +---------+---------------+---------+-----------+----------+--------------+ FV Prox  Full                                                        +---------+---------------+---------+-----------+----------+--------------+ FV Mid   Full                                                        +---------+---------------+---------+-----------+----------+--------------+  The left ventricle has no regional wall motion abnormalities.  3. Right ventricular systolic function is normal. The right ventricular size is normal.  4. The aortic valve is tricuspid.  5. The inferior vena cava is dilated in size with >50% respiratory variability, suggesting right atrial pressure of 8 mmHg. FINDINGS  Left Ventricle: Left ventricular ejection fraction, by estimation, is 60 to 65%. The left ventricle has normal function. The left ventricle has no regional wall motion abnormalities. Right Ventricle: The right ventricular size is normal. No increase in right ventricular wall thickness. Right ventricular systolic function is normal. Left Atrium: Left atrial size was normal in size. Right Atrium: Right atrial size was normal in size. Pericardium: There is no evidence of pericardial effusion. Aortic Valve: The aortic valve is tricuspid. Venous: The inferior vena cava is dilated in size with greater than 50% respiratory variability, suggesting right atrial pressure of 8 mmHg. Additional Comments: Spectral Doppler performed. Color Doppler performed.  Lennie Odor MD Electronically signed by Lennie Odor MD Signature Date/Time: 01/28/2023/4:09:35 PM    Final    VAS [place] LOWER EXTREMITY VENOUS (DVT)  Result Date: 01/26/2023  Lower Venous DVT Study Patient Name:  Karen Gardner  Date of Exam:   01/26/2023 Medical Rec #: [id number]         Accession #:    [id number] Date of Birth: 11/19/1955         Patient Gender: F Patient Age:   67 years Exam Location:  [facility] Procedure:      VAS [place] LOWER EXTREMITY VENOUS (DVT) Referring Phys: Osvaldo Shipper  --------------------------------------------------------------------------------  Indications: Chest pain, Swelling, and Pain.  Comparison Study: No prior Performing Technologist: Fernande Bras  Examination Guidelines: A complete evaluation includes B-mode imaging, spectral Doppler, color Doppler, and power Doppler as needed of all accessible portions of each vessel. Bilateral testing is considered an integral part of a complete examination. Limited examinations for reoccurring indications may be performed as noted. The reflux portion of the exam is performed with the patient in reverse Trendelenburg.  +---------+---------------+---------+-----------+----------+--------------+ RIGHT    CompressibilityPhasicitySpontaneityPropertiesThrombus Aging +---------+---------------+---------+-----------+----------+--------------+ CFV      Full           Yes      Yes                                 +---------+---------------+---------+-----------+----------+--------------+ SFJ      Full                                                        +---------+---------------+---------+-----------+----------+--------------+ FV Prox  Full                                                        +---------+---------------+---------+-----------+----------+--------------+ FV Mid   Full                                                        +---------+---------------+---------+-----------+----------+--------------+  The left ventricle has no regional wall motion abnormalities.  3. Right ventricular systolic function is normal. The right ventricular size is normal.  4. The aortic valve is tricuspid.  5. The inferior vena cava is dilated in size with >50% respiratory variability, suggesting right atrial pressure of 8 mmHg. FINDINGS  Left Ventricle: Left ventricular ejection fraction, by estimation, is 60 to 65%. The left ventricle has normal function. The left ventricle has no regional wall motion abnormalities. Right Ventricle: The right ventricular size is normal. No increase in right ventricular wall thickness. Right ventricular systolic function is normal. Left Atrium: Left atrial size was normal in size. Right Atrium: Right atrial size was normal in size. Pericardium: There is no evidence of pericardial effusion. Aortic Valve: The aortic valve is tricuspid. Venous: The inferior vena cava is dilated in size with greater than 50% respiratory variability, suggesting right atrial pressure of 8 mmHg. Additional Comments: Spectral Doppler performed. Color Doppler performed.  Lennie Odor MD Electronically signed by Lennie Odor MD Signature Date/Time: 01/28/2023/4:09:35 PM    Final    VAS [place] LOWER EXTREMITY VENOUS (DVT)  Result Date: 01/26/2023  Lower Venous DVT Study Patient Name:  Karen Gardner  Date of Exam:   01/26/2023 Medical Rec #: [id number]         Accession #:    [id number] Date of Birth: 11/19/1955         Patient Gender: F Patient Age:   67 years Exam Location:  [facility] Procedure:      VAS [place] LOWER EXTREMITY VENOUS (DVT) Referring Phys: Osvaldo Shipper  --------------------------------------------------------------------------------  Indications: Chest pain, Swelling, and Pain.  Comparison Study: No prior Performing Technologist: Fernande Bras  Examination Guidelines: A complete evaluation includes B-mode imaging, spectral Doppler, color Doppler, and power Doppler as needed of all accessible portions of each vessel. Bilateral testing is considered an integral part of a complete examination. Limited examinations for reoccurring indications may be performed as noted. The reflux portion of the exam is performed with the patient in reverse Trendelenburg.  +---------+---------------+---------+-----------+----------+--------------+ RIGHT    CompressibilityPhasicitySpontaneityPropertiesThrombus Aging +---------+---------------+---------+-----------+----------+--------------+ CFV      Full           Yes      Yes                                 +---------+---------------+---------+-----------+----------+--------------+ SFJ      Full                                                        +---------+---------------+---------+-----------+----------+--------------+ FV Prox  Full                                                        +---------+---------------+---------+-----------+----------+--------------+ FV Mid   Full                                                        +---------+---------------+---------+-----------+----------+--------------+

## 2023-01-30 NOTE — Progress Notes (Signed)
Rounding Note    Patient Name: Karen Gardner Date of Encounter: 01/30/2023  [facility] Cardiologist: Karen Lemma, MD   Subjective   Pain improved this AM, but she also just recently got a pain pill. Sleeping on my arrival. Asked me to speak with her sister Karen Gardner, did this with patient on speakerphone.  Inpatient Medications    Scheduled Meds:  amLODipine  10 mg Oral Daily   aspirin  650 mg Oral TID   Followed by   Melene Muller ON 02/03/2023] aspirin  650 mg Oral BID   atorvastatin  80 mg Oral QHS   carvedilol  6.25 mg Oral BID WC   colchicine  0.6 mg Oral BID   enoxaparin (LOVENOX) injection  40 mg Subcutaneous Q24H   escitalopram  10 mg Oral QHS   insulin aspart  0-20 Units Subcutaneous TID WC   insulin aspart  0-5 Units Subcutaneous QHS   insulin glargine-yfgn  10 Units Subcutaneous QHS   isosorbide mononitrate  60 mg Oral Daily   latanoprost  1 drop Both Eyes QHS   pantoprazole  40 mg Oral BID AC   ranolazine  1,000 mg Oral BID   sucralfate  1 g Oral TID WC & HS   Continuous Infusions:  PRN Meds: acetaminophen **OR** acetaminophen, hydrALAZINE, lidocaine, ondansetron **OR** ondansetron (ZOFRAN) IV, oxyCODONE, traZODone   Vital Signs    Vitals:   01/29/23 1547 01/29/23 2012 01/30/23 0351 01/30/23 0749  BP: 124/72 114/74 128/76 129/75  Pulse: 71 73 74 79  Resp: 16 18 18 17   Temp: 98.5 F (36.9 C) 98.3 F (36.8 C) 98.8 F (37.1 C) 98.8 F (37.1 C)  TempSrc: Oral Oral Oral Oral  SpO2: 98% 95% 96% 100%  Weight:      Height:        Intake/Output Summary (Last 24 hours) at 01/30/2023 1109 Last data filed at 01/30/2023 0824 Gross per 24 hour  Intake 120 ml  Output --  Net 120 ml      01/29/2023    4:57 AM 01/25/2023    1:17 PM 11/14/2022    9:57 AM  Last 3 Weights  Weight (lbs) 182 lb 180 lb 176 lb 12.8 oz  Weight (kg) 82.555 kg 81.647 kg 80.196 kg      Telemetry    Not on telemetry - Personally Reviewed  Physical Exam   GEN: No  acute distress.   Neck: No JVD Cardiac: RRR, no murmurs, rubs, or gallops.  Respiratory: Clear to auscultation bilaterally. GI: Soft, nontender, non-distended  MS: No edema; No deformity. Neuro:  Nonfocal  Psych: Normal affect   New pertinent results (labs, ECG, imaging, cardiac studies)    Limited echo without pericardial effusion. Normal LVEF, normal RV size/function, no significant valve disease noted  Patient Profile     67 y.o. female with PMH CAD, type II diabetes, CKD stage 3a, hypertension admitted with acute chest pain  Assessment & Plan    Chest pain, inflammatory markers-> acute pericarditis -HsTn unremarkable -echo without pericardial effusion -started on aspirin 650 mg TID for 7 days, then 650 BID for 7 days, then 650 mg daily for 7 days. Monitor for bleeding. With gastritis, need to be especially cautious for melena/worsening stomach upset. Can stop aspirin early if pain resolves. Avoid NSAIDs given CAD and CKD -continue colchicine 0.6 mg BID for 3 mos -on PPI for GI protection  CAD -medically managed, HsTn unremarkable as above -continue statin, imdur, amlodipine, ranexa, carvedilol -patient's sister  asking about bypass surgery. Has not had cath since 2022, has been medically managed. Do not this current pain is angina. Has follow up next week with Dr. Herbie Gardner.  Gastritis Anemia of chronic disease -as above, with aspirin, monitor closely for melena/worsening pain -Hgb stable (ranges in the 10s), platelets stable (has been as low as 127 in the last 6 mos)  Chronic kidney disease, stage 3a -avoid NSAIDs  Type II diabetes -on trulicity, insulin, metformin as an outpatient -with CKD, would consider SGLT2i as an outpatient if no contraindication  [facility] will sign off.   Medication Recommendations:  Aspirin as above. Colchicine for 3 mos. PPI. Continue statin, imdur, amlodipine, ranexa, carvedilol Other recommendations (labs, testing, etc):   none Follow up as an outpatient:  Has follow up appt with Dr. Herbie Gardner on 02/05/23 at 1:30 PM     Signed, Karen Red, MD  01/30/2023, 11:09 AM

## 2023-01-31 LAB — SURGICAL PATHOLOGY

## 2023-01-31 NOTE — Progress Notes (Addendum)
Walked by patient's room and found her sitting on the floor. Patient report she had soiled herself and got a shower. After her shower she said she leaned against the bathroom door and slide to the floor. Patient denies hitting her head and denies any pain or injury. No visible injuries assessed. She is alert and oriented x 4. Patient was assisted back to bed. Her bed alarm was turned on and fall mat was place by bed. Call light within reach. MD was notified of fall and that patient denied injury. No new orders were received. Attempted to notify patients contact but got an answering machine.   01/30/23 0351  What Happened  Was fall witnessed? No  Was patient injured? No  Patient found on floor  Found by Staff-comment  Stated prior activity bathroom-unassisted  Provider Notification  Provider Name/Title Dr Julian Reil  Adult Fall Risk Assessment  Risk Factor Category (scoring not indicated) Not Applicable  Age 67  Fall History: Fall within 6 months prior to admission 0  Elimination; Bowel and/or Urine Incontinence 2  Elimination; Bowel and/or Urine Urgency/Frequency 0  Medications: includes PCA/Opiates, Anti-convulsants, Anti-hypertensives, Diuretics, Hypnotics, Laxatives, Sedatives, and Psychotropics 3  Patient Care Equipment 1  Mobility-Assistance 2  Mobility-Gait 2  Mobility-Sensory Deficit 0  Altered awareness of immediate physical environment 1  Impulsiveness 2  Lack of understanding of one's physical/cognitive limitations 0  Total Score 14  Patient Fall Risk Level High fall risk  Adult Fall Risk Interventions  Required Bundle Interventions *See Row Information* High fall risk - low, moderate, and high requirements implemented  Additional Interventions Use of appropriate toileting equipment (bedpan, BSC, etc.)  Fall intervention(s) refused/Patient educated regarding refusal Bed alarm;Nonskid socks  Screening for Fall Injury Risk (To be completed on HIGH fall risk patients) - Assessing  Need for Floor Mats  Risk For Fall Injury- Criteria for Floor Mats Previous fall this admission  Will Implement Floor Mats Yes  Vitals  Temp 98.8 F (37.1 C)  Temp Source Oral  BP 128/76  MAP (mmHg) 90  BP Location Right Arm  BP Method Automatic  Patient Position (if appropriate) Lying  Pulse Rate 74  Pulse Rate Source Monitor  Resp 18  Oxygen Therapy  SpO2 96 %  O2 Device Room Air  Pain Assessment  Pain Scale 0-10  Pain Score 0  Neurological  Neuro (WDL) X  Level of Consciousness Alert  Orientation Level Oriented X4  Cognition Follows commands  Speech Clear  RUE Motor Response Purposeful movement  LUE Motor Response Purposeful movement  RLE Motor Response Purposeful movement  LLE Motor Response Purposeful movement  Neuro Symptoms Forgetful

## 2023-02-05 ENCOUNTER — Ambulatory Visit: Payer: Medicare Other | Attending: Cardiology | Admitting: Cardiology

## 2023-02-05 ENCOUNTER — Encounter: Payer: Self-pay | Admitting: Cardiology

## 2023-02-05 VITALS — BP 144/82 | HR 71 | Ht 65.0 in | Wt 170.8 lb

## 2023-02-05 DIAGNOSIS — E785 Hyperlipidemia, unspecified: Secondary | ICD-10-CM | POA: Diagnosis not present

## 2023-02-05 DIAGNOSIS — R079 Chest pain, unspecified: Secondary | ICD-10-CM | POA: Diagnosis not present

## 2023-02-05 DIAGNOSIS — E1169 Type 2 diabetes mellitus with other specified complication: Secondary | ICD-10-CM | POA: Insufficient documentation

## 2023-02-05 DIAGNOSIS — I2089 Other forms of angina pectoris: Secondary | ICD-10-CM | POA: Diagnosis not present

## 2023-02-05 DIAGNOSIS — I25119 Atherosclerotic heart disease of native coronary artery with unspecified angina pectoris: Secondary | ICD-10-CM | POA: Insufficient documentation

## 2023-02-05 DIAGNOSIS — I10 Essential (primary) hypertension: Secondary | ICD-10-CM | POA: Diagnosis not present

## 2023-02-05 DIAGNOSIS — I300 Acute nonspecific idiopathic pericarditis: Secondary | ICD-10-CM | POA: Insufficient documentation

## 2023-02-05 DIAGNOSIS — K2270 Barrett's esophagus without dysplasia: Secondary | ICD-10-CM | POA: Diagnosis not present

## 2023-02-05 DIAGNOSIS — I340 Nonrheumatic mitral (valve) insufficiency: Secondary | ICD-10-CM | POA: Diagnosis not present

## 2023-02-05 NOTE — Patient Instructions (Addendum)
Medication Instructions:    Do not take Atorvastatin until Jan 1,2025   Continue taking Colchicine until  end of Dec 31  ( or when the bottle is complete in Dec. 2024     *If you need a refill on your cardiac medications before your next appointment, please call your pharmacy*   Lab Work: not needed    Testing/Procedures: Not needed   Follow-Up: At [facility], you and your health needs are our priority.  As part of our continuing mission to provide you with exceptional heart care, we have created designated Provider Care Teams.  These Care Teams include your primary Cardiologist (physician) and Advanced Practice Providers (APPs -  Physician Assistants and Nurse Practitioners) who all work together to provide you with the care you need, when you need it.     Your next appointment:   4 month(s)  The format for your next appointment:   In Person  Provider:   Bernadene Person NP  I then 8 months with Dr Herbie Baltimore   Other Instructions    Contact Your primary  Provider about your pain  you are having -- suggest you may need to be referred to Rheumatololgist

## 2023-02-05 NOTE — Progress Notes (Unsigned)
Cardiology Office Note:  .   Date:  02/08/2023  ID:  [name], DOB November 08, 1955, MRN [id number] PCP: [name], MD  [facility] Providers Cardiologist:  Bryan Lemma, MD     Chief Complaint  Patient presents with   Pain    Pt says she feels aching pain all over her body.    Follow-up   Coronary Artery Disease   Hospitalization Follow-up    Patient Profile: Marland Kitchen     Karen Gardner is a 67 y.o. female with a PMH notable for obstructive single-vessel CAD (with essentially subtotaled small caliber RCA), HTN, DM-2, and HLD who presents here for hospital follow-up at the request of [name],*.  CAD: Coronary CT in 2022 revealed coronary calcium score of 2437 (99th percentile).  Cardiac Catheterization November 2022: Severe single-vessel disease involving a codominant RCA with multiple segments of 70 to 90% stenosis, diffuse mild to moderate stenosis throughout the left coronary system, most notably 50% ramus, managed medically due to not being considered suitable for PCI.    Karen Gardner was last seen on November 14, 2022 by Bernadene Person, NP for evaluation of chest pain.  Noting intermittent chest tightness on high-dose Ranexa along with 90 mg Imdur.  No further syncope.  Just mild dizziness.  Admitted on 01/25/2023 with unspecified chest pain.  Ruled out for MI with negative troponin levels.  EKG was stable.  No PE noted.  Evaluated in ED EGD.  Chest pain was thought be potentially related to  [place] was started on colchicine for 3 months along with high-dose aspirin for 1 week tapering down.  Subjective  Discussed the use of AI scribe software for clinical note transcription with the patient, who gave verbal consent to proceed.  History of Present Illness   The patient, with a history of coronary artery disease and recent hospitalization, presents with persistent, diffuse body pain. Initially, the discomfort was described as a tightening  sensation, which then progressed to widespread pain. The patient reports that the pain is constant and is not relieved by high-dose aspirin therapy. The patient also reports taking oxycodone, which was prescribed during the recent hospital stay, and notes some relief when taking this medication in conjunction with aspirin.  During the recent hospitalization, the patient underwent a gastroesophageal endoscopy due to suspected Barrett's esophagus. Biopsies were taken, but the patient reports not understanding the results. The patient also reports being seen by a partner of the current physician, who suggested the pain could be due to pericarditis, an inflammation of the heart. However, the patient's description of the pain did not align with typical angina symptoms.  The patient is currently on a regimen of high-dose aspirin and colchicine, both prescribed for the suspected pericarditis. The patient is also taking atorvastatin for cholesterol management. However, the patient reports no significant relief from the pain with these medications. The patient also reports taking a medication starting with a "T", but could not recall the name during the consultation.  The patient's pain is diffuse and seems to be musculoskeletal in nature, as it can be elicited by touch in various areas of the body. The patient reports that the pain is most severe on the right side of the chest and back, but also notes that it is present all over the body. The patient reports that the pain is most relieved by sleep and the combination of aspirin and oxycodone.  The patient has expressed a desire for additional narcotic pain meds to  treat her pain, but was ~ receptive of discussing potential non-narcotic pain management options and considering the possibility of seeing a rheumatologist for further evaluation of the widespread pain. The patient has also expressed concern about the potential side effects of the current medication  regimen.      Cardiovascular ROS: positive for - chest pain and just generalized musculoskeletal pain all over.  No PND orthopnea.  No change in pain from change of position but she does feel better than when first diagnosed. negative for - dyspnea on exertion, edema, irregular heartbeat, orthopnea, palpitations, paroxysmal nocturnal dyspnea, rapid heart rate, shortness of breath, or syncope or near syncope, TIA or RCVS, claudication  ROS:  Review of Systems -  notable symptoms above    Objective  Current Meds  Medication Sig   amLODipine (NORVASC) 10 MG tablet Take 1 tablet (10 mg total) by mouth daily.   aspirin EC 325 MG tablet Take 2 tablets (650 mg total) by mouth 3 (three) times daily for 4 days, THEN 2 tablets (650 mg total) 2 (two) times daily for 7 days, THEN 2 tablets (650 mg total) daily for 7 days.   carvedilol (COREG) 6.25 MG tablet TAKE 1 TABLET BY MOUTH TWICE DAILY   colchicine 0.6 MG tablet Take 1 tablet (0.6 mg total) by mouth 2 (two) times daily.   Continuous Blood Gluc Sensor (FREESTYLE LIBRE 14 DAY SENSOR) MISC Apply topically every 14 (fourteen) days.   Dulaglutide (TRULICITY) 0.75 MG/0.5ML SOPN Inject 0.75 mg into the skin once a week. Sundays   escitalopram (LEXAPRO) 10 MG tablet Take 10 mg by mouth at bedtime.    glucose blood (ONETOUCH VERIO) test strip Use as instructed to check blood sugar once a day dx code E11.65   insulin lispro (HUMALOG) 100 UNIT/ML KwikPen Inject 8 Units into the skin 3 (three) times daily.   isosorbide mononitrate (IMDUR) 60 MG 24 hr tablet Take 1 tablet (60 mg total) by mouth daily.   latanoprost (XALATAN) 0.005 % ophthalmic solution Place 1 drop into both eyes at bedtime.   metFORMIN (GLUCOPHAGE-XR) 500 MG 24 hr tablet Take 250 mg by mouth daily with breakfast.   nitroGLYCERIN (NITROSTAT) 0.4 MG SL tablet DISSOLVE 1 TABLET UNDER TONGUE EVERY 5 MINUTES AS NEEDED FOR CHESTPAIN   ondansetron (ZOFRAN) 4 MG tablet Take 1 tablet (4 mg total) by  mouth every 6 (six) hours as needed for nausea. (Patient taking differently: Take 4 mg by mouth as needed for nausea.)   [EXPIRED] oxyCODONE (OXY IR/ROXICODONE) 5 MG immediate release tablet Take 1 tablet (5 mg total) by mouth every 6 (six) hours as needed for up to 8 days for severe pain (pain score 7-10).   pantoprazole (PROTONIX) 40 MG tablet Take 1 tablet (40 mg total) by mouth 2 (two) times daily before a meal.   prednisoLONE acetate (PRED FORTE) 1 % ophthalmic suspension Place 1 drop into the right eye 4 (four) times daily.   ranolazine (RANEXA) 1000 MG SR tablet Take 1 tablet (1,000 mg total) by mouth 2 (two) times daily.   traZODone (DESYREL) 50 MG tablet Take 0.5 tablets (25 mg total) by mouth at bedtime as needed for sleep.    Studies Reviewed: Marland Kitchen   EKG Interpretation Date/Time:  Monday February 05 2023 13:56:55 EST Ventricular Rate:  66 PR Interval:  146 QRS Duration:  78 QT Interval:  430 QTC Calculation: 450 R Axis:   69  Text Interpretation: Sinus rhythm with Premature atrial complexes Septal infarct ,  age undetermined When compared with ECG of 28-Jan-2023 09:25, Premature atrial complexes are now Present Septal infarct is now Present Nonspecific T wave abnormality no longer evident in Lateral leads Confirmed by Bryan Lemma ([id number]) on 02/05/2023 2:02:37 PM    Lab Results  Component Value Date   CHOL 125 07/17/2022   HDL 78 07/17/2022   LDLCALC 42 07/17/2022   TRIG 25 07/17/2022   CHOLHDL 1.6 07/17/2022     Echocardiogram December 2022 :EF 55 to 60%, no RWMA, G1 DD. She was hospitalized in April 2024 in the setting of chest pain. Troponin was negative. CTA chest was negative for PE or dissection. Repeat echocardiogram showed normal LVEF, no RWMA, mild MR.  Echocardiogram 07/17/2022: EF 55 to 60%.  Mild concentric LVH.  No RWMA.  Normal RV.  AOV sclerosis with no stenosis.  Echocardiogram 01/28/2023: EF 60 to 65%.  Normal LV size and function.  No RWMA.  Mild IVC  dilation.  Risk Assessment/Calculations:        Physical Exam:   VS:  BP (!) 144/82 (BP Location: Left Arm, Patient Position: Sitting, Cuff Size: Normal)   Pulse 71   Ht 5\' 5"  (1.651 m)   Wt 170 lb 12.8 oz (77.5 kg)   SpO2 98%   BMI 28.42 kg/m    Wt Readings from Last 3 Encounters:  02/05/23 170 lb 12.8 oz (77.5 kg)  01/29/23 182 lb (82.6 kg)  11/14/22 176 lb 12.8 oz (80.2 kg)    GEN: Well nourished, well developed in mild distress; seems to be somewhat uncomfortable-states that she "aches all over NECK: No JVD; No carotid bruits CARDIAC: Normal S1, S2; RRR, no murmurs, rubs, gallops RESPIRATORY:  Clear to auscultation without rales, wheezing or rhonchi ; nonlabored, good air movement. ABDOMEN: Soft, non-tender, non-distended EXTREMITIES:  No edema; No deformity     ASSESSMENT AND PLAN: .    Problem List Items Addressed This Visit       Cardiology Problems   Acute idiopathic pericarditis    Not 100% convinced that her symptoms were pericarditic in nature.  Echocardiogram not show any notable pericardial effusion.   However, we will go with the clinical decision there is made in the hospital and continue aspirin and colchicine for now. No significant improvement in symptoms. -Continue aspirin 650mg  two tablets three times a day until November 16, then reduce to 81mg  daily. -Continue colchicine until end of December, then discontinue.       Chronic stable angina (HCC) (Chronic)    Is on amlodipine, carvedilol, Imdur and Ranexa for antianginal pain.  Her symptoms currently are not anginal in nature.      Coronary artery disease involving native coronary artery of native heart with angina pectoris (HCC) (Chronic)    Current symptoms are not consistent with angina.  Musculoskeletal pains more so than even potentially pericarditis pain. We reviewed her coronary anatomy.  The right coronary artery is a small caliber nondominant vessel not likely to cause resting.  At this  point since it is not a new finding.  Plan: Continue amlodipine 10 mg daily and carvedilol 625 mg twice daily along with Imdur 60 mg daily and Ranexa 1000 mg twice daily. She is currently taking high doses of aspirin and will go back to 81 mg once the course is complete.      Essential hypertension (Chronic)    BP is little elevated today on multiple medications, but probably related to ongoing pain      Hyperlipidemia  associated with type 2 diabetes mellitus (HCC) (Chronic)    On atorvastatin, which may be contributing to musculoskeletal pain.  Lipids were excellent as of April 2024. -Hold atorvastatin until January to see if this has any potential effect on her diffuse aching.  Continue lispro insulin, metformin and Trulicity.      Mild mitral valve regurgitation    Not noted to be significant on recent echo        Other   Barrett's esophagus determined by endoscopy    Identified on recent endoscopy. -Follow up with GI for further management.      Chest pain of uncertain etiology - Primary (Chronic)    Diffuse musculoskeletal pain, not consistent with angina. Possible inflammatory process. No evidence of heart damage or pericardial effusion. -Continue Ranexa and Imdur for angina. -Consider referral to rheumatology for further evaluation of inflammatory process.      Relevant Orders   EKG 12-Lead (Completed)             Follow-Up: Return in about 4 months (around 06/05/2023) for 4 months with APP, 8 months MD.  Total time spent: 34 min spent with patient + 17 min spent charting = 51 min     Signed, Marykay Lex, MD, MS Bryan Lemma, M.D., M.S. Interventional Cardiologist  [facility]  Pager # [phone] Phone # [phone] [address] [place], [place] [address]

## 2023-02-08 ENCOUNTER — Encounter: Payer: Self-pay | Admitting: Cardiology

## 2023-02-08 DIAGNOSIS — K2270 Barrett's esophagus without dysplasia: Secondary | ICD-10-CM | POA: Insufficient documentation

## 2023-02-08 NOTE — Assessment & Plan Note (Addendum)
On atorvastatin, which may be contributing to musculoskeletal pain.  Lipids were excellent as of April 2024. -Hold atorvastatin until January to see if this has any potential effect on her diffuse aching.  Continue lispro insulin, metformin and Trulicity.

## 2023-02-08 NOTE — Assessment & Plan Note (Signed)
Diffuse musculoskeletal pain, not consistent with angina. Possible inflammatory process. No evidence of heart damage or pericardial effusion. -Continue Ranexa and Imdur for angina. -Consider referral to rheumatology for further evaluation of inflammatory process.

## 2023-02-08 NOTE — Assessment & Plan Note (Signed)
Current symptoms are not consistent with angina.  Musculoskeletal pains more so than even potentially pericarditis pain. We reviewed her coronary anatomy.  The right coronary artery is a small caliber nondominant vessel not likely to cause resting.  At this point since it is not a new finding.  Plan: Continue amlodipine 10 mg daily and carvedilol 625 mg twice daily along with Imdur 60 mg daily and Ranexa 1000 mg twice daily. She is currently taking high doses of aspirin and will go back to 81 mg once the course is complete.

## 2023-02-08 NOTE — Assessment & Plan Note (Signed)
Not noted to be significant on recent echo

## 2023-02-08 NOTE — Assessment & Plan Note (Signed)
Is on amlodipine, carvedilol, Imdur and Ranexa for antianginal pain.  Her symptoms currently are not anginal in nature.

## 2023-02-08 NOTE — Assessment & Plan Note (Signed)
BP is little elevated today on multiple medications, but probably related to ongoing pain

## 2023-02-08 NOTE — Assessment & Plan Note (Signed)
Not 100% convinced that her symptoms were pericarditic in nature.  Echocardiogram not show any notable pericardial effusion.   However, we will go with the clinical decision there is made in the hospital and continue aspirin and colchicine for now. No significant improvement in symptoms. -Continue aspirin 650mg  two tablets three times a day until November 16, then reduce to 81mg  daily. -Continue colchicine until end of December, then discontinue.

## 2023-02-08 NOTE — Assessment & Plan Note (Deleted)
Diffuse musculoskeletal pain, not consistent with angina. Possible inflammatory process. No evidence of heart damage or pericardial effusion. -Continue Ranexa and Imdur for angina. -Consider referral to rheumatology for further evaluation of inflammatory process.

## 2023-02-08 NOTE — Assessment & Plan Note (Signed)
Identified on recent endoscopy. -Follow up with GI for further management.

## 2023-02-09 ENCOUNTER — Other Ambulatory Visit: Payer: Self-pay | Admitting: Cardiology

## 2023-02-09 MED ORDER — RANOLAZINE ER 1000 MG PO TB12: 1000.0000 mg | ORAL_TABLET | Freq: Two times a day (BID) | ORAL | 3 refills | Status: AC

## 2023-02-22 ENCOUNTER — Other Ambulatory Visit: Payer: Self-pay | Admitting: Internal Medicine

## 2023-02-22 ENCOUNTER — Ambulatory Visit
Admission: RE | Admit: 2023-02-22 | Discharge: 2023-02-22 | Disposition: A | Discharge status: Home / Self Care | Payer: Medicare Other | Source: Ambulatory Visit | Attending: Internal Medicine | Admitting: Internal Medicine

## 2023-02-22 DIAGNOSIS — R921 Mammographic calcification found on diagnostic imaging of breast: Secondary | ICD-10-CM | POA: Diagnosis not present

## 2023-04-06 DIAGNOSIS — M19071 Primary osteoarthritis, right ankle and foot: Secondary | ICD-10-CM | POA: Diagnosis not present

## 2023-04-11 DIAGNOSIS — I10 Essential (primary) hypertension: Secondary | ICD-10-CM | POA: Diagnosis not present

## 2023-04-11 DIAGNOSIS — E113593 Type 2 diabetes mellitus with proliferative diabetic retinopathy without macular edema, bilateral: Secondary | ICD-10-CM | POA: Diagnosis not present

## 2023-04-11 DIAGNOSIS — N1831 Chronic kidney disease, stage 3a: Secondary | ICD-10-CM | POA: Diagnosis not present

## 2023-04-11 DIAGNOSIS — R2689 Other abnormalities of gait and mobility: Secondary | ICD-10-CM | POA: Diagnosis not present

## 2023-04-11 DIAGNOSIS — F3341 Major depressive disorder, recurrent, in partial remission: Secondary | ICD-10-CM | POA: Diagnosis not present

## 2023-04-11 DIAGNOSIS — E113293 Type 2 diabetes mellitus with mild nonproliferative diabetic retinopathy without macular edema, bilateral: Secondary | ICD-10-CM | POA: Diagnosis not present

## 2023-04-11 DIAGNOSIS — E1165 Type 2 diabetes mellitus with hyperglycemia: Secondary | ICD-10-CM | POA: Diagnosis not present

## 2023-04-27 DIAGNOSIS — M25551 Pain in right hip: Secondary | ICD-10-CM | POA: Diagnosis not present

## 2023-05-08 DIAGNOSIS — E113593 Type 2 diabetes mellitus with proliferative diabetic retinopathy without macular edema, bilateral: Secondary | ICD-10-CM | POA: Diagnosis not present

## 2023-05-08 DIAGNOSIS — Z794 Long term (current) use of insulin: Secondary | ICD-10-CM | POA: Diagnosis not present

## 2023-05-08 DIAGNOSIS — N1831 Chronic kidney disease, stage 3a: Secondary | ICD-10-CM | POA: Diagnosis not present

## 2023-05-08 DIAGNOSIS — Z9884 Bariatric surgery status: Secondary | ICD-10-CM | POA: Diagnosis not present

## 2023-05-08 DIAGNOSIS — M8580 Other specified disorders of bone density and structure, unspecified site: Secondary | ICD-10-CM | POA: Diagnosis not present

## 2023-05-09 DIAGNOSIS — M19071 Primary osteoarthritis, right ankle and foot: Secondary | ICD-10-CM | POA: Diagnosis not present

## 2023-05-30 DIAGNOSIS — R262 Difficulty in walking, not elsewhere classified: Secondary | ICD-10-CM | POA: Diagnosis not present

## 2023-06-07 ENCOUNTER — Ambulatory Visit: Payer: Medicare Other | Attending: Nurse Practitioner | Admitting: Nurse Practitioner

## 2023-06-07 NOTE — Progress Notes (Deleted)
 Office Visit    Patient Name: Karen Gardner Date of Encounter: 06/07/2023  Primary Care Provider:  [name], MD Primary Cardiologist:  Bryan Lemma, MD  Chief Complaint    68 year old female with a history of CAD with chronic chest pain, mild MR, hypertension, hyperlipidemia, CKD stage IIIa, Barrett's esophagus, and type 2 diabetes who presents for follow-up related to CAD.  Past Medical History    Past Medical History:  Diagnosis Date   Allergic rhinitis    Anxiety    Arthritis    Asthma    childhood   Chronic low back pain    CKD (chronic kidney disease), stage III (HCC)    However, most recent creatinine 1.5.   Coronary artery disease involving native heart with other form of angina pectoris, unspecified vessel or lesion type [facility]) 11/16/2020   11/16/2020: CORONARY CA++ SCORE: Agatston Score 2245.  LAD 1093, LCx 959, RCA 193 (99th percentile) -> COR CTA (Agatston 2437) -moderate to severe multivessel CAD with significant blooming in all 3 epicardial vessels.  CAD RADS 3-4. FFRCT suggests mid RCA occlusion, => CARDIAC CATH 02/02/21: RCA: prox 70%, Mid-distal 90% & distal 90% (small caliber, Not viable PCI target; Prox RI 50%.   Depression    With anxiety   GERD (gastroesophageal reflux disease)    Headache    sinus headaches    Hypercholesterolemia    Hypertension    Iron deficiency anemia 05/26/2015   Has required transfusions-followed by Dr. Myna Hidalgo   Iron malabsorption 05/26/2015   Menopause    Numbness in both hands    mostly at night   Obesity    Osteoarthritis of both knees    Status post right TKA (and ankle) with plans for left TKA   Type 2 diabetes mellitus without complication, with long-term current use of insulin (HCC)    Type 2 on insulin pump   Vasovagal syncope 2018   Negative work-up   Vitamin D deficiency    Past Surgical History:  Procedure Laterality Date   BIOPSY  01/27/2023   Procedure: BIOPSY;  Surgeon: Lynann Bologna,  DO;  Location: [facility] ENDOSCOPY;  Service: Gastroenterology;;   COLONOSCOPY     ESOPHAGOGASTRODUODENOSCOPY N/A 01/27/2023   Procedure: ESOPHAGOGASTRODUODENOSCOPY (EGD);  Surgeon: Lynann Bologna, DO;  Location: [facility] ENDOSCOPY;  Service: Gastroenterology;  Laterality: N/A;   EYE SURGERY Bilateral    [place]    HERNIA REPAIR     umb hernia as child   KNEE ARTHROSCOPY  02/12/2012   Procedure: ARTHROSCOPY KNEE;  Surgeon: Nestor Lewandowsky, MD;  Location: [facility];  Service: Orthopedics;  Laterality: Right;  Partial Lateral Meniscectomy, Debridement chondromalacia   LAPAROSCOPIC GASTRIC SLEEVE RESECTION N/A 03/14/2016   Procedure: LAPAROSCOPIC GASTRIC SLEEVE RESECTION, WITH REPAIR OF HIATAL HERNIA REPAI, AND UPPER ENDO;  Surgeon: Luretha Murphy, MD;  Location: [facility] ORS;  Service: General;  Laterality: N/A;   LEFT HEART CATH AND CORONARY ANGIOGRAPHY N/A 02/02/2021   Procedure: LEFT HEART CATH AND CORONARY ANGIOGRAPHY;  Surgeon: Marykay Lex, MD;  Location: [facility] INVASIVE CV LAB;; Heavily calcified tortuous codominant RCA with proximal 70% stenosis.  Mid to distal RCA 90% stenosis.  Distal RCA 90%.  Not a PCI target.  50% RI, proximal to mid LAD 20%.  Mid to distal LCx 30% with 30% in LP AV-heavily calcified.  EF 55-65%.  Mildly elevated LVEDP.   ORIF ANKLE FRACTURE Right 02/11/2014   Procedure: OPEN REDUCTION INTERNAL FIXATION (ORIF) RIGHT ANKLE FRACTURE;  Surgeon: Nestor Lewandowsky, MD;  Location: [facility] OR;  Service: Orthopedics;  Laterality: Right;   TOTAL KNEE ARTHROPLASTY Right 08/05/2014   Procedure: TOTAL KNEE ARTHROPLASTY;  Surgeon: Gean Birchwood, MD;  Location: [facility] OR;  Service: Orthopedics;  Laterality: Right;   TRANSTHORACIC ECHOCARDIOGRAM  05/2016   EF 60 to 65%.  Severe basal septal LVH with mild concentric LVH.  No R WMA.  GR 1 DD.  Indeterminate filling pressures. Minimal valve disease.   TRANSTHORACIC ECHOCARDIOGRAM  03/29/2021   EF 55 to 60%.  No R WMA-normal strain pattern.  GR 1 DD.   Normal RV with RV P mild aortic sclerosis but no stenosis no AI.  Mildly elevated RAP.   TRIGGER FINGER RELEASE Right 10/14/2018   Procedure: RELEASE TRIGGER FINGER/A-1 PULLEY;  Surgeon: Betha Loa, MD;  Location: [facility];  Service: Orthopedics;  Laterality: Right;   UPPER GI ENDOSCOPY      Allergies  Allergies  Allergen Reactions   Codeine Shortness Of Breath   Lactose Intolerance (Gi) Diarrhea and Other (See Comments)    *Gas*      Labs/Other Studies Reviewed    The following studies were reviewed today:  Cardiac Studies & Procedures   ______________________________________________________________________________________________ CARDIAC CATHETERIZATION  CARDIAC CATHETERIZATION 02/02/2021  Narrative   Ramus lesion is 50% stenosed. Prox LAD to Mid LAD lesion is 20% stenosed.   Heavily calcified, tortuous "codominant" RCA: Prox RCA lesion is 70% stenosed.  Mid RCA to Dist RCA lesion is 90% stenosed.  Dist RCA lesion is 90% stenosed. ->  Not PCI target   Mid Cx to Dist Cx lesion is 30% stenosed with 35% stenosed side branch in LPAV.  (Heavily calcified)   ------------------------------------------   The left ventricular systolic function is normal. The left ventricular ejection fraction is 55-65% by visual estimate.   LV end diastolic pressure is mildly elevated.   There is no aortic valve stenosis.  SUMMARY Moderate-severe diffusely calcified coronary arteries with severe single-vessel disease involving a small caliber codominant RCA with multiple segments of 70 to 90% stenoses-best treated medically. Diffuse mild to moderate stenosis throughout the left coronary system-most notably 50% Ramus Intermedius. Mildly elevated LVEDP with systemic hypertension. Very tortuous Left Subclavian-Innominate Artery system -> FOR FUTURE CARDIAC CATHETERIZATIONS, WOULD RECOMMEND FEMORAL ACCESS DUE TO VERY DIFFICULT MANIPULATION OF CATHETERS, AND ARTERIAL  SPASM.   RECOMMENDATIONS Aggressive risk factor modification with lipid, glycemic and blood pressure management Daily aspirin Add carvedilol 3.125 mg daily with plans to titrate up further in outpatient setting. Hold metformin 48 hours post cath  Bryan Lemma, MD  Findings Coronary Findings Diagnostic  Dominance: Co-dominant  Left Main Vessel is large.  Left Anterior Descending Vessel is large. The vessel exhibits minimal luminal irregularities. The vessel is moderately calcified. The vessel is tortuous. Prox LAD to Mid LAD lesion is 20% stenosed. The lesion is segmental and eccentric.  First Diagonal Branch Vessel is small in size. The vessel exhibits minimal luminal irregularities.  Ramus Intermedius Vessel is moderate in size There is mild diffuse disease throughout the vessel. There is moderate focal disease in the vessel. The vessel is calcified. Ramus lesion is 50% stenosed. The lesion is concentric. The lesion is moderately calcified.  Left Circumflex Vessel is large. There is mild diffuse disease throughout the vessel. There is mild focal disease in the vessel. The vessel is calcified. The vessel is tortuous. Mid Cx to Dist Cx lesion is 30% stenosed with 35% stenosed side branch in LPAV. The lesion  is eccentric. The lesion is moderately calcified.  First Obtuse Marginal Branch Vessel is small in size.  First Left Posterolateral Branch Vessel is moderate in size.  Left Posterior Atrioventricular Artery Vessel is large in size.  Right Coronary Artery Vessel was injected. Vessel is small. Tapers from what looks like a moderate caliber vessel to very small caliber/diminutive "codominant "RCA -> extensively tortuous and calcified.  Not PCI target There is moderate diffuse disease throughout the vessel. The vessel is severely calcified. The vessel is moderately tortuous. Prox RCA lesion is 70% stenosed. The lesion is focal, discrete and concentric. The lesion is  severely calcified. Mid RCA to Dist RCA lesion is 90% stenosed. The lesion is located at the bend, segmental, eccentric and irregular. The lesion is severely calcified. Dist RCA lesion is 90% stenosed. The lesion is focal, discrete, eccentric and irregular. The lesion is moderately calcified.  Acute Marginal Branch Vessel is small in size.  Right Posterior Descending Artery Vessel is small in size.  Intervention  No interventions have been documented.     ECHOCARDIOGRAM  ECHOCARDIOGRAM LIMITED 01/28/2023  Narrative ECHOCARDIOGRAM LIMITED REPORT    Patient Name:   Karen Gardner Date of Exam: 01/28/2023 Medical Rec #:  [id number]        Height:       65.0 in Accession #:    [id number]       Weight:       180.0 lb Date of Birth:  November 27, 1955        BSA:          1.892 m Patient Age:    67 years         BP:           155/65 mmHg Patient Gender: F                HR:           72 bpm. Exam Location:  Inpatient  Procedure: Limited Echo, Color Doppler and Cardiac Doppler  Indications:    R07.9* Chest pain, unspecified  History:        Patient has prior history of Echocardiogram examinations, most recent 07/17/2022. CAD; Risk Factors:Hypertension, Diabetes and Dyslipidemia.  Sonographer:    Irving Burton Senior RDCS Referring Phys: [id number] Ronnald Ramp O'NEAL  IMPRESSIONS   1. No pericardial effusion noted. 2. Left ventricular ejection fraction, by estimation, is 60 to 65%. The left ventricle has normal function. The left ventricle has no regional wall motion abnormalities. 3. Right ventricular systolic function is normal. The right ventricular size is normal. 4. The aortic valve is tricuspid. 5. The inferior vena cava is dilated in size with >50% respiratory variability, suggesting right atrial pressure of 8 mmHg.  FINDINGS Left Ventricle: Left ventricular ejection fraction, by estimation, is 60 to 65%. The left ventricle has normal function. The left ventricle has no regional  wall motion abnormalities.  Right Ventricle: The right ventricular size is normal. No increase in right ventricular wall thickness. Right ventricular systolic function is normal.  Left Atrium: Left atrial size was normal in size.  Right Atrium: Right atrial size was normal in size.  Pericardium: There is no evidence of pericardial effusion.  Aortic Valve: The aortic valve is tricuspid.  Venous: The inferior vena cava is dilated in size with greater than 50% respiratory variability, suggesting right atrial pressure of 8 mmHg.  Additional Comments: Spectral Doppler performed. Color Doppler performed.  Lennie Odor MD Electronically signed by Lennie Odor MD Signature Date/Time:  01/28/2023/4:09:35 PM    Final    MONITORS  CARDIAC EVENT MONITOR 05/25/2016  Narrative NSR, rare ectopy.  No sustained arrhythmias.   CT SCANS  CT CORONARY FRACTIONAL FLOW RESERVE DATA PREP 01/18/2021  Narrative : CT FFR analysis was performed on the original cardiac CTA dataset. Diagrammatic representation of the CT FFR analysis is provided in a separate PDF document in PACS. This dictation was created using the PDF document and an interactive 3D model of the results. The 3D model is not available in the EMR/PACS.  INTERPRETATION: CT FFR provides simultaneous calculation of pressure and flow across the entire coronary tree. For clinical decision making, CT FFR values should be obtained 1-2 cm distal to the lower border of each stenosis measured. Coronary CTA-related artifacts may impair the diagnostic accuracy of the original cardiac CTA and FFR CT results. *Due to the fact that CT FFR represents a mathematically-derived analysis, it is recommended that the results be interpreted as follows:  1. CT FFR >0.80: Low likelihood of hemodynamic significance. 2. CT FFR 0.76-0.80: Borderline likelihood of hemodynamic significance. 3. CT FFR =< 0.75: High likelihood of hemodynamic  significance.  *Coronary CT Angiography-derived Fractional Flow Reserve Testing in Patients with Stable Coronary Artery Disease: Recommendations on Interpretation and Reporting. Radiology: Cardiothoracic Imaging. 2019;1(5):e190050  FINDINGS: 1. Left Main: Low likelihood of hemodynamic significance. (FFR = 0.99)  2. LAD: Low likelihood of hemodynamic significance. (FFR proximal 0.97, Mid 0.93, Distal 0.87) 3. LCX: Low likelihood of hemodynamic significance. (FFR Proximal 0.99, Mid 0.96, Distal 0.82) 4. RCA: High likelihood of hemodynamic significance. (FFR Proximal 0.99, Mid occluded) 5. Ramus: Low likelihood of hemodynamic significance. (FFR Proximal 98, Mid 0.86, Distal 0.82)  IMPRESSION:  1. CT FFR analysis demonstrates possible high grade lesion in the mid RCA with possible occlusion, although on CTA images there appeared to be some flow in the distal RCA which could be due to collateral blood flow.  2.  Recommend cardiac catheterization.  Armanda Magic, MD   Electronically Signed By: Armanda Magic M.D. On: 01/18/2021 22:03   CT SCANS  CT CORONARY MORPH W/CTA COR W/SCORE 01/17/2021  Addendum 01/17/2021  9:20 PM ADDENDUM REPORT: 01/17/2021 21:18  CLINICAL DATA:  Chest pain  EXAM: Cardiac/Coronary CTA  TECHNIQUE: A non-contrast, gated CT scan was obtained with axial slices of 3 mm through the heart for calcium scoring. Calcium scoring was performed using the Agatston method. A 120 kV prospective, gated, contrast cardiac scan was obtained. Gantry rotation speed was 250 msecs and collimation was 0.6 mm. Two sublingual nitroglycerin tablets (0.8 mg) were given. The 3D data set was reconstructed in 5% intervals of the 35-75% of the R-R cycle. Diastolic phases were analyzed on a dedicated workstation using MPR, MIP, and VRT modes. The patient received 95 cc of contrast.  FINDINGS: Image quality: Excellent.  Noise artifact is: Limited.  Coronary Arteries:   Normal coronary origin.  Right dominance.  Left main: The left main is a large caliber vessel with a normal take off from the left coronary cusp that bifurcates to form a left anterior descending artery and a left circumflex artery. There is no plaque or stenosis.  Left anterior descending artery: The LAD gives off 2 patent diagonal branches. The proximal LAD is heavily calcified with at least moderate mixed plaque but possibly severe plaque with stenosis of at least 50-69% but possibly > 70% with high risk features. There is mild calcified plaque in the mid LAD with associated stenosis of 25-49%. The D1  is not well defined but appears to have at least a moderate calcified plaque with stenosis 50-60%. There is significant blooming artifact so this may be overestimated. There is mild calcified plaque in the proximal D2 with associated stenosis of 25-49%.  Left circumflex artery: The LCX is non-dominant and gives off 2 patent obtuse marginal branches. There proximal to mid LCx is heavily calcified with circumferential calcification. There is at least moderate calcified plaque in the proximal and mid LCx with associated stenosis of 50-69% but may be > 70%. There is significant blooming artifact.  Right coronary artery: The RCA is dominant with normal take off from the right coronary cusp. The RCA terminates as a PDA and right posterolateral branch. There is moderate calcified plaque in the proximal RCA with associated stenosis of 50-69%. This is followed by moderate mixed plaque with associated stenosis of 50-69%. There is mild soft plaque in the distal RCA with associated stenosis of 50-69%.  Right Atrium: Right atrial size is within normal limits.  Right Ventricle: The right ventricular cavity is within normal limits.  Left Atrium: Left atrial size is normal in size with no left atrial appendage filling defect.  Left Ventricle: The ventricular cavity size is within normal  limits. There are no stigmata of prior infarction. There is no abnormal filling defect.  Pulmonary arteries: Normal in size without proximal filling defect.  Pulmonary veins: Normal pulmonary venous drainage.  Pericardium: Normal thickness with no significant effusion or calcium present.  Cardiac valves: The aortic valve is trileaflet without significant calcification. The mitral valve is normal structure without significant calcification.  Aorta: Normal caliber with no significant disease.  Extra-cardiac findings: See attached radiology report for non-cardiac structures.  IMPRESSION: 1. Coronary calcium score of 2437. This was 99th percentile for age-, sex, and race-matched controls.  2.  Normal coronary origin with right dominance.  3. Moderate atherosclerosis but possibly severe. There is significant blooming artifact in all 3 epicardial vessels which may overestimate degree of stenosis. CAD RADS 3 but possibly 4.  4.  Consider Cardiac catheterization.  5.  Study has been submitted for FFR analysis.  RECOMMENDATIONS: 1. CAD-RADS 0: No evidence of CAD (0%). Consider non-atherosclerotic causes of chest pain.  2. CAD-RADS 1: Minimal non-obstructive CAD (0-24%). Consider non-atherosclerotic causes of chest pain. Consider preventive therapy and risk factor modification.  3. CAD-RADS 2: Mild non-obstructive CAD (25-49%). Consider non-atherosclerotic causes of chest pain. Consider preventive therapy and risk factor modification.  4. CAD-RADS 3: Moderate stenosis. Consider symptom-guided anti-ischemic pharmacotherapy as well as risk factor modification per guideline directed care. Additional analysis with CT FFR will be submitted.  5. CAD-RADS 4: Severe stenosis. (70-99% or > 50% left main). Cardiac catheterization or CT FFR is recommended. Consider symptom-guided anti-ischemic pharmacotherapy as well as risk factor modification per guideline directed care. Invasive  coronary angiography recommended with revascularization per published guideline statements.  6. CAD-RADS 5: Total coronary occlusion (100%). Consider cardiac catheterization or viability assessment. Consider symptom-guided anti-ischemic pharmacotherapy as well as risk factor modification per guideline directed care.  7. CAD-RADS N: Non-diagnostic study. Obstructive CAD can't be excluded. Alternative evaluation is recommended.  Armanda Magic, MD   Electronically Signed By: Armanda Magic M.D. On: 01/17/2021 21:18  Narrative EXAM: OVER-READ INTERPRETATION  CT CHEST  The following report is an over-read performed by radiologist Dr. Richarda Overlie of [facility] on 01/17/2021. This over-read does not include interpretation of cardiac or coronary anatomy or pathology. The coronary calcium score/coronary CTA interpretation by  the cardiologist is attached.  COMPARISON:  11/16/2020  FINDINGS: Vascular: Normal caliber of the visualized thoracic aorta. Limited evaluation of the pulmonary arteries.  Mediastinum/Nodes: Evidence for a hiatal hernia associated with previous gastric surgery.  Lungs/Pleura: No pleural effusions. Few subtle densities along the periphery of the right lower lobe appear to be new and favor atelectasis. Again noted is a 2 mm nodule in the left lower lobe on sequence 11, image 8. Otherwise, the visualized lungs are clear without large pleural effusions.  Upper Abdomen: Small focus of enhancement in the right hepatic lobe on sequence 10 image 53 is probably an incidental finding. Again noted are postsurgical changes involving the stomach.  Musculoskeletal: Degenerative disc and endplate changes in thoracic spine.  IMPRESSION: No acute abnormalities involving the extracardiac structures.  Postsurgical changes in stomach.  Stable 2 mm nodule in the left lower lobe. No follow-up needed if patient is low-risk. Non-contrast chest CT can be  considered in 12 months if patient is high-risk. This recommendation follows the consensus statement: Guidelines for Management of Incidental Pulmonary Nodules Detected on CT Images: From the Fleischner Society 2017; Radiology 2017; 284:228-243.  Electronically Signed: By: Richarda Overlie M.D. On: 01/17/2021 15:09     ______________________________________________________________________________________________     Recent Labs: 01/28/2023: ALT 22; Magnesium 2.1 01/29/2023: Hemoglobin 10.3; Platelets 188 01/30/2023: BUN 28; Creatinine, Ser 1.32; Potassium 4.3; Sodium 135  Recent Lipid Panel    Component Value Date/Time   CHOL 125 07/17/2022 0947   TRIG 25 07/17/2022 0947   HDL 78 07/17/2022 0947   CHOLHDL 1.6 07/17/2022 0947   VLDL 5 07/17/2022 0947   LDLCALC 42 07/17/2022 0947    History of Present Illness    68 year old female with the above past medical history including CAD with chronic chest pain, mild MR, hypertension, hyperlipidemia, CKD stage IIIa, Barrett's esophagus, and type 2 diabetes,   She has a history of CAD.  Coronary CT in 2022 revealed coronary calcium score of 2437 (99th percentile).  She eventually underwent cardiac catheterization November 2022 that showed severe single-vessel disease involving a codominant RCA with multiple segments of 70 to 90% stenosis, diffuse mild to moderate stenosis throughout the left coronary system, most notably 50% ramus, managed medically due to not being considered suitable for PCI.  Echocardiogram in December 2022 showed EF 55 to 60%, no RWMA, G1 DD. She was hospitalized in April 2024 in the setting of chest pain.  Troponin was negative.  CTA chest was negative for PE or dissection.  Repeat echocardiogram showed normal LVEF, no RWMA, mild MR.  She was discharged home in stable condition though she continued to note intermittent chest tightness with exertion.  Patient's cath films were reviewed by Dr. Herbie Baltimore who felt that RCA is without  targets for PCI. Ranexa was increased to 1000 mg twice daily.  Imdur was also later increased.  She was hospitalized in October 2024 in the setting of nonspecific chest pain. Troponin was negative.  She underwent EGD revealed Barrett's esophagus.  She is following with GI.  It was felt that her symptoms were possibly due to pericarditis, she was started on aspirin and colchicine.  She was last seen in the office on 02/05/2019 for noted ongoing diffuse musculoskeletal pain, not consistent with angina.  Statin holiday was advised.  She presents today for follow-up.  Since her last visit she has  1. CAD: Coronary CT in 2022 revealed coronary calcium score of 2437 (99th percentile).  She eventually underwent cardiac catheterization November  2022 that showed severe single-vessel disease involving a codominant RCA with multiple segments of 70 to 90% stenosis, diffuse mild to moderate stenosis throughout the left coronary system, most notably 50% ramus, managed medically (not considered suitable for PCI).  She was hospitalized in April 2024 in the setting of chest pain, ongoing medical management was advised.  Imdur was increased at her last visit.  She continues to note intermittent chest tightness, overall improved.  She has only taken nitroglycerin once since her last visit.  Overall symptoms appear to be stable, will review ongoing recommendations with Dr. Herbie Baltimore.  Reviewed ED precautions. Continue aspirin, amlodipine, carvedilol, irbesartan, Imdur, Ranexa, and Lipitor.   2. Hypertension: BP well controlled. Continue current antihypertensive regimen.    3. Hyperlipidemia: LDL was 42 in 07/2022.  Continue Lipitor.   4. Mitral valve regurgitation: Echo in April 2024 showed normal LVEF, no RWMA, mild MR. Asymptomatic. Consider repeat echo as clinically indicated.    5. History of syncope: She had a prior episode of syncope that was noted at her office visit in December 2023.  She was at church and had not had  much to eat or drink, she felt overheated, possibly dehydrated, which led to a syncopal event.  She she states she has had several episodes of syncope since, most recently just 4 days ago.  She was seated in an outdoor restaurant, she notes she "passed out" and when she woke up she had "regurgitated."  She denies any further syncope, presyncope, though she does note intermittent dizziness, this is not new.  We discussed possible 30-day event monitor, carotid Dopplers.  Recent echo overall reassuring.  BP and HR have been stable.  She declines any additional testing at this time. Reviewed ED precautions. Encouraged adequate hydration, adequate oral intake.   6. CKD stage III: Creatinine was stable at 1.25 in 10/2022.    7. Type 2 diabetes: A1c was 7.1 in 10/2022. Follows with endocrinology.   8. Disposition: Follow-up   Home Medications    Current Outpatient Medications  Medication Sig Dispense Refill   amLODipine (NORVASC) 10 MG tablet Take 1 tablet (10 mg total) by mouth daily. 30 tablet 0   aspirin EC 81 MG tablet Take 1 tablet (81 mg total) by mouth daily. 30 minutes prior to taking Isosorbide. Resume after you have completed the course of high-dose aspirin. (Patient not taking: Reported on 02/05/2023)     atorvastatin (LIPITOR) 80 MG tablet Take 1 tablet (80 mg total) by mouth at bedtime. (Patient not taking: Reported on 02/05/2023) 90 tablet 3   carvedilol (COREG) 6.25 MG tablet TAKE 1 TABLET BY MOUTH TWICE DAILY 180 tablet 3   colchicine 0.6 MG tablet Take 1 tablet (0.6 mg total) by mouth 2 (two) times daily. 60 tablet 2   Continuous Blood Gluc Sensor (FREESTYLE LIBRE 14 DAY SENSOR) MISC Apply topically every 14 (fourteen) days.     Dulaglutide (TRULICITY) 0.75 MG/0.5ML SOPN Inject 0.75 mg into the skin once a week. Sundays     escitalopram (LEXAPRO) 10 MG tablet Take 10 mg by mouth at bedtime.      fluticasone (FLONASE) 50 MCG/ACT nasal spray Place 1 spray into both nostrils daily. (Patient  not taking: Reported on 02/05/2023)     glucose blood (ONETOUCH VERIO) test strip Use as instructed to check blood sugar once a day dx code E11.65 50 each 2   insulin lispro (HUMALOG) 100 UNIT/ML KwikPen Inject 8 Units into the skin 3 (three) times daily.  isosorbide mononitrate (IMDUR) 60 MG 24 hr tablet Take 1 tablet (60 mg total) by mouth daily. 90 tablet 3   latanoprost (XALATAN) 0.005 % ophthalmic solution Place 1 drop into both eyes at bedtime.     metFORMIN (GLUCOPHAGE-XR) 500 MG 24 hr tablet Take 250 mg by mouth daily with breakfast.     Multiple Vitamin (MULTIVITAMIN WITH MINERALS) TABS tablet Take 1 tablet by mouth 2 (two) times daily. (Patient not taking: Reported on 02/05/2023)     nitroGLYCERIN (NITROSTAT) 0.4 MG SL tablet DISSOLVE 1 TABLET UNDER TONGUE EVERY 5 MINUTES AS NEEDED FOR CHESTPAIN 25 tablet 2   ondansetron (ZOFRAN) 4 MG tablet Take 1 tablet (4 mg total) by mouth every 6 (six) hours as needed for nausea. (Patient taking differently: Take 4 mg by mouth as needed for nausea.) 20 tablet 0   pantoprazole (PROTONIX) 40 MG tablet Take 1 tablet (40 mg total) by mouth 2 (two) times daily before a meal. 60 tablet 1   prednisoLONE acetate (PRED FORTE) 1 % ophthalmic suspension Place 1 drop into the right eye 4 (four) times daily.     ranolazine (RANEXA) 1000 MG SR tablet Take 1 tablet (1,000 mg total) by mouth 2 (two) times daily. 180 tablet 3   traZODone (DESYREL) 50 MG tablet Take 0.5 tablets (25 mg total) by mouth at bedtime as needed for sleep. 15 tablet 0   No current facility-administered medications for this visit.     Review of Systems    ***.  All other systems reviewed and are otherwise negative except as noted above.    Physical Exam    VS:  There were no vitals taken for this visit. , BMI There is no height or weight on file to calculate BMI.     GEN: Well nourished, well developed, in no acute distress. HEENT: normal. Neck: Supple, no JVD, carotid bruits, or  masses. Cardiac: RRR, no murmurs, rubs, or gallops. No clubbing, cyanosis, edema.  Radials/DP/PT 2+ and equal bilaterally.  Respiratory:  Respirations regular and unlabored, clear to auscultation bilaterally. GI: Soft, nontender, nondistended, BS + x 4. MS: no deformity or atrophy. Skin: warm and dry, no rash. Neuro:  Strength and sensation are intact. Psych: Normal affect.  Accessory Clinical Findings    ECG personally reviewed by me today -    - no acute changes.   Lab Results  Component Value Date   WBC 5.9 01/29/2023   HGB 10.3 (L) 01/29/2023   HCT 32.5 (L) 01/29/2023   MCV 90.0 01/29/2023   PLT 188 01/29/2023   Lab Results  Component Value Date   CREATININE 1.32 (H) 01/30/2023   BUN 28 (H) 01/30/2023   NA 135 01/30/2023   K 4.3 01/30/2023   CL 102 01/30/2023   CO2 22 01/30/2023   Lab Results  Component Value Date   ALT 22 01/28/2023   AST 17 01/28/2023   ALKPHOS 62 01/28/2023   BILITOT 0.9 01/28/2023   Lab Results  Component Value Date   CHOL 125 07/17/2022   HDL 78 07/17/2022   LDLCALC 42 07/17/2022   TRIG 25 07/17/2022   CHOLHDL 1.6 07/17/2022    Lab Results  Component Value Date   HGBA1C 7.4 (H) 01/25/2023    Assessment & Plan    1.  ***  No BP recorded.  {Refresh Note OR Click here to enter BP  :1}***   Joylene Grapes, NP 06/07/2023, 6:07 AM

## 2023-06-13 ENCOUNTER — Other Ambulatory Visit: Payer: Self-pay | Admitting: Cardiology

## 2023-08-01 DIAGNOSIS — Z23 Encounter for immunization: Secondary | ICD-10-CM | POA: Diagnosis not present

## 2023-08-20 DIAGNOSIS — Z794 Long term (current) use of insulin: Secondary | ICD-10-CM | POA: Diagnosis not present

## 2023-08-20 DIAGNOSIS — Z760 Encounter for issue of repeat prescription: Secondary | ICD-10-CM | POA: Diagnosis not present

## 2023-08-20 DIAGNOSIS — E119 Type 2 diabetes mellitus without complications: Secondary | ICD-10-CM | POA: Diagnosis not present

## 2023-09-15 ENCOUNTER — Other Ambulatory Visit: Payer: Self-pay | Admitting: Cardiology

## 2023-10-11 ENCOUNTER — Encounter (HOSPITAL_COMMUNITY): Payer: Self-pay | Admitting: *Deleted
